# Patient Record
Sex: Male | Born: 1965 | Race: White | Hispanic: No | Marital: Single | Smoking: Former smoker
Health system: Southern US, Community
[De-identification: ages and names within clinical notes are randomized; demographics above are authoritative.]

## PROBLEM LIST (undated history)

## (undated) VITALS — BP 135/86 | HR 91 | Temp 99.0°F | Resp 18 | Ht 70.0 in | Wt 235.0 lb

## (undated) DIAGNOSIS — I10 Essential (primary) hypertension: Secondary | ICD-10-CM

## (undated) DIAGNOSIS — T7840XA Allergy, unspecified, initial encounter: Secondary | ICD-10-CM

## (undated) DIAGNOSIS — E785 Hyperlipidemia, unspecified: Secondary | ICD-10-CM

## (undated) DIAGNOSIS — E669 Obesity, unspecified: Secondary | ICD-10-CM

## (undated) DIAGNOSIS — F319 Bipolar disorder, unspecified: Secondary | ICD-10-CM

## (undated) DIAGNOSIS — F259 Schizoaffective disorder, unspecified: Secondary | ICD-10-CM

## (undated) DIAGNOSIS — E119 Type 2 diabetes mellitus without complications: Secondary | ICD-10-CM

## (undated) DIAGNOSIS — F22 Delusional disorders: Secondary | ICD-10-CM

## (undated) DIAGNOSIS — F250 Schizoaffective disorder, bipolar type: Secondary | ICD-10-CM

## (undated) HISTORY — DX: Allergy, unspecified, initial encounter: T78.40XA

## (undated) HISTORY — PX: APPENDECTOMY: SHX54

## (undated) HISTORY — PX: TONSILLECTOMY: SUR1361

---

## 1999-03-06 ENCOUNTER — Inpatient Hospital Stay (HOSPITAL_COMMUNITY): Admission: AD | Admit: 1999-03-06 | Discharge: 1999-03-17 | Payer: Self-pay | Admitting: Psychiatry

## 2007-08-15 ENCOUNTER — Inpatient Hospital Stay (HOSPITAL_COMMUNITY): Admission: EM | Admit: 2007-08-15 | Discharge: 2007-08-29 | Payer: Self-pay | Admitting: Psychiatry

## 2007-08-19 ENCOUNTER — Ambulatory Visit: Payer: Self-pay | Admitting: Psychiatry

## 2007-08-22 ENCOUNTER — Ambulatory Visit: Payer: Self-pay | Admitting: Psychiatry

## 2007-08-23 ENCOUNTER — Other Ambulatory Visit (HOSPITAL_COMMUNITY): Payer: Self-pay | Admitting: Psychiatry

## 2008-03-22 ENCOUNTER — Ambulatory Visit: Payer: Self-pay | Admitting: Psychiatry

## 2008-03-22 ENCOUNTER — Inpatient Hospital Stay (HOSPITAL_COMMUNITY): Admission: RE | Admit: 2008-03-22 | Discharge: 2008-03-26 | Payer: Self-pay | Admitting: Psychiatry

## 2008-08-02 ENCOUNTER — Ambulatory Visit: Payer: Self-pay | Admitting: Cardiovascular Disease

## 2010-12-12 NOTE — Assessment & Plan Note (Signed)
[facility] OFFICE NOTE   Greg, [name]                     MRN:          [id number]  DATE:08/02/2008                            DOB:          11-17-1965    REFERRING PHYSICIAN:  Gelene Mink MD   REASON FOR CONSULTATION:  Tachycardia, abnormal echocardiogram findings.   HISTORY OF PRESENT ILLNESS:  Greg [name] is a pleasant 45 year old  Caucasian male with a past medical history significant for diabetes  mellitus, cigarette use, smokeless tobacco use, hypertension, bipolar  disorder, and schizoaffective disorder, who is referred today for  further evaluation of resting tachycardia and an abnormal  echocardiogram.  The patient tells me that he has been in his normal  state of good health and denies any episodes of dyspnea, dyspnea on  exertion, fatigue, weakness, palpitations, diaphoresis, nausea,  vomiting, dizziness, near syncope, syncope, orthopnea, PND, or lower  extremity edema.  He does describe 2 episodes over the last several  years of having mild to substernal chest pain while lying in bed at  night.  He tells me the pain only lasts for a few seconds and resolves  when he rolled over on his other side.  This chest pain was not  associated with any dyspnea, palpitations, or diaphoresis.  He is  relatively inactive and lives in a group home secondary to his mental  disorder.  He was seen in the office of Dr. Timoteo [name] 1 month ago for  elevated blood pressure.  He was noted at that time to have a heart rate  in the 70s.  He was referred for an echocardiogram as well as an EKG.  The EKG showed sinus rhythm with a ventricular rate of 94 beats per  minute.  There were nonspecific T-wave abnormalities noted.  This was  also a borderline low-voltage EKG.  His echocardiogram was performed on  July 12, 2008, and was read by Greg [name] in [place], [place].  This showed normal left ventricular  size and function.  There  was mild left ventricular hypertrophy with mild diastolic dysfunction.  There were no significant valvular abnormalities noted.   PAST MEDICAL HISTORY:  1. Bipolar disorder.  2. Schizoaffective disorder.  3. Hypertension.  4. Tobacco abuse - the patient uses cigarettes and smokeless tobacco.      He smokes one half to one pack of cigarettes per day and per the      patient keeps smokeless tobacco in his mouth all day long.  5. Borderline diabetes mellitus.   PAST SURGICAL HISTORY:  Tonsillectomy.   ALLERGIES:  HALDOL, which made the patient short of breath.   CURRENT MEDICATIONS:  1. Klonopin 1 mg twice daily.  2. Risperdal 2 mg in the morning, 4 mg at night.  3. Depakene 500 mg in the morning, 1500 mg at night.  4. Protonix 40 mg once daily.  5. Benztropine 1 mg twice daily.  6. Benzonatate 200 mg 3 times daily.  7. Cephalexin 500 mg twice daily.  8. Metoprolol tartrate 50 mg in the morning.   SOCIAL HISTORY:  The  patient uses cigarettes and smokeless tobacco as  described above.  He denies use of alcohol or illicit drugs.  He is  single and is disabled secondary to his mental illness.  He lives in a  group home.   FAMILY HISTORY:  There is no family history of premature coronary artery  disease.  There is no family history of sudden cardiac death.  Both the  patient's parents are alive and healthy.  His father does have  hypertension.  He has no siblings.   REVIEW OF SYSTEMS:  As stated in history of present illness is otherwise  negative.   PHYSICAL EXAMINATION:  VITAL SIGNS:  Blood pressure 122/94, pulse 101  and regular, respirations 12 and unlabored.  GENERAL:  He is a pleasant, young Caucasian male in no acute distress.  He is alert and oriented x3.  SKIN:  Warm and dry.  PSYCHIATRIC:  Mood and affect are appropriate.  NEUROLOGIC:  No focal neurological deficits.  MUSCULOSKELETAL:  Muscle strength and tone is normal.  HEENT:  Normal.   NECK:  No JVD.  No carotid bruits.  No thyromegaly.  No lymphadenopathy.  LUNGS:  Clear to auscultation bilaterally without wheezes, rhonchi, or  crackles noted.  CARDIOVASCULAR:  Tachycardic, regular rhythm.  No murmurs, gallops, or  rubs noted.  ABDOMEN:  Soft, nontender, obese.  Bowel sounds are present.  EXTREMITIES:  No evidence of edema.  Pulses are 2+ in all extremities.   DIAGNOSTIC STUDIES.:  1. A 12-lead EKG obtained in an outside office on July 12, 2008,      shows sinus rhythm with a ventricular rate of 94 beats per minute.      This is a relatively low-amplitude EKG with nonspecific T-wave      abnormalities noted.  2. Transthoracic echocardiogram performed on July 12, 2008,      interpreted by Greg [name] showed normal left ventricular size and      function with ejection fraction estimated at 54%.  There is mild      left ventricular hypertrophy and mild diastolic dysfunction.  There      are no significant valvular abnormalities noted.  3. Carotid Doppler studies performed on July 12, 2008, at [facility] shows no hemodynamically significant stenosis in      the internal carotid arteries.  There is bilateral vertebral artery      patency with antegrade flow.  4. Recent laboratory values from June 30, 2008, show a hemoglobin      of 15.0, white blood cell count 8.9, potassium 4.7, TSH 2.572, T4      7.8.   ASSESSMENT AND PLAN:  This is a pleasant 45 year old Caucasian male with  history of borderline diabetes mellitus, hypertension, tobacco abuse,  bipolar disorder, and schizoaffective disorder, who presents for further  evaluation of resting sinus tachycardia.  The patient describes no  symptoms that are suggestive of arrhythmias.  His sinus tachycardia is  most likely a physiological response to some other stressor.  He has had  recent normal thyroid studies and is not anemic.  I feel that the  patient's use of smokeless tobacco for 16  hours per day with combined  cigarette use could explain some of his tachycardia.  The patient also  tells me that he has been under a great deal of stress in the last few  weeks.  He is very anxious and confirms his anxiety to me today.  I do  not think that any further cardiovascular workup is indicated at the  current time.  The patient has no symptoms that are suggestive of  myocardial ischemia or angina.  I have encouraged him to stop using  smokeless tobacco and to stop using cigarettes.  I have also encouraged  him to avoid any caffeinated beverages.  On another note, the patient  has no signs of ongoing infection or hypovolemia to explain his  tachycardia.  His echocardiogram showed no evidence of a pericardial  effusion.  I do not think that he needs to be followed in our office.  I  would recommend that followup echocardiogram be performed in 1-2 years  to reassess his level of left ventricular hypertrophy and diastolic  dysfunction.  I have had a discussion with the patient and his mother  about abnormal relaxation of the myocardium, which is described as  diastolic dysfunction.  Adequate blood pressure control will be the most  important way to prevent worsening of his left ventricular hypertrophy  and diastolic dysfunction.  He is encouraged to follow with Dr. Timoteo [name]  for his primary care needs.     Verne Carrow, MD  Electronically Signed    CM/MedQ  DD: 08/02/2008  DT: 08/03/2008  Job #: [id number]   cc:   Dr. Gelene [name]
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
## 2010-12-12 NOTE — Discharge Summary (Signed)
NAME:  Greg [name], Greg [name] NO.:  [id number]   MEDICAL RECORD NO.:  [id number]          PATIENT TYPE:  IPS   LOCATION:  0304                          FACILITY:  [facility]   PHYSICIAN:  Jasmine Pang, M.D. DATE OF BIRTH:  [birth year]   DATE OF ADMISSION:  08/15/2007  DATE OF DISCHARGE:  08/19/2007                               DISCHARGE SUMMARY   IDENTIFICATION:  This is a 45 year old divorced white male who was  admitted on a voluntary basis on August 15, 2007.   HISTORY OF PRESENT ILLNESS:  The patient presented to the [facility].  He was reporting increasing depression with suicidal ideation.  He stated that he felt like someone was out to kill him.  His mother  stated that he is no longer responding to his medications.  The patient  was tearful.  He says that nothing specific has happened to trigger this  episode.  He was felt to be having delusions.  He says that the back  screen door had been cut, so someone can come in and kill him.  He has  had no hospitalizations since the early 1990s, when he was found to be  bipolar.  He has already stated his last inpatient stay was in the early  1990s at [facility].  He was diagnosed with bipolar disorder.  He  has been followed as an outpatient at [facility].  He is currently under the care of Dr. Rollen Sox.   His grandmother had schizophrenia.  No other reported family history.   He has about two beers every 6 months.  He denies drugs.  He dips  tobacco at times.   He is known to have hypertension and obesity.   He is currently on Risperdal solution 4 mg at night, Depakene 500 mg in  the morning and 1500 mg at night.  He is also prescribed Klonopin 1 mg  nightly and Pepcid.   He is allergic to HALDOL DECANOATE, which produced anaphylaxis.   PHYSICAL FINDINGS:  The patient was medically cleared in the emergency  department at [facility].  His UDS was positive for  benzodiazepines, but he was prescribed Klonopin.  His urinalysis was  unremarkable.  His labs revealed that his random glucose was elevated at  261.  He is not known to be diabetic.  An a.m. Depakote level was 88.9  (50-70).   HOSPITAL COURSE:  Upon admission, the patient was placed on Depakene  liquid 500 mg in the morning and 1500 mg at nightly, Risperdal 1 mg p.o.  nightly, and Klonopin 1 mg p.o. q.a.m. and nightly.  On August 18, 2007, Risperdal liquid was increased to 4 mg p.o. nightly.  During  hospitalization, his TSH was done, which was 3.516.  Hemoglobin A1c was  slightly high at 8.6 (4.6-6.1).  In individual sessions with me, the  patient was friendly and cooperative.  He stated he was admitted due to  his paranoia.  He did think someone was trying to kill him.  He told his  parents about his fears and they  took him to [facility] who sent  him here.  He signed in on a voluntary basis.  His sleep was improving.  Appetite was good.  As hospitalization progressed, mood became less  depressed and less anxious.  He began to realize that there was no one  trying to hurt him and felt his paranoia had resolved.  On August 19, 2007, mental status had improved from remission status.  Mood was less  depressed and less anxious.  Affect consistent with mood.  There was no  suicidal or homicidal ideation.  No thoughts of self-injurious behavior.  No auditory or visual hallucinations.  No more paranoia or delusional  ideation.  Thoughts were logical and goal-directed, thought content.  No  predominant theme.  His mother was contacted and felt comfortable with  him returning home.  She did state she would like a referral for case  management to help with possible placement in assisted living.  She was  concerned because she and his father are aging and she wanted to have  something else in place to help care for him since they were his primary  caretakers.  This was arranged by our  case manager at the hospital.  Again, sleep was good and appetite was good.  It was felt the patient  was safe to be discharged today.   DISCHARGE DIAGNOSES:  Axis I:  Psychotic disorder, not otherwise  specified (rule out schizoaffective).  Axis II: None.  Axis III:  Hypertension and obesity.  Rule out new onset diabetes.  Axis IV:  Severe (occupational, housing, or relationship issues.  Burden  of psychiatric illness and burden of medical problems).  Axis V:  Global assessment of functioning on discharge was 48.  Global  assessment of functioning upon admission was 30.  Global assessment of  functioning highest past year was 55-60.   DISCHARGE PLANS:  There were no specific activity level or dietary  restrictions other than that recommended by his primary care physician  due to his possible diabetes.  The patient will return to the [facility] for followup treatment with Dr. Allyne Gee.   DISCHARGE MEDICATIONS:  1. Risperdal liquid 4 mg at bedtime.  2. Clonazepam 1 mg in the a.m. and at bedtime.  3. Depakene 500 mg in the a.m. and 1500 mg at bedtime.      Jasmine Pang, M.D.  Electronically Signed     BHS/MEDQ  D:  08/19/2007  T:  08/19/2007  Job:  [id number]
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
## 2010-12-15 NOTE — Discharge Summary (Signed)
NAME:  Greg [name], Greg [name] NO.:  [id number]   MEDICAL RECORD NO.:  [id number]          PATIENT TYPE:  IPS   LOCATION:  0406                          FACILITY:  [facility]   PHYSICIAN:  Anselm Jungling, MD  DATE OF BIRTH:  1966-04-30   DATE OF ADMISSION:  03/22/2008  DATE OF DISCHARGE:  03/26/2008                               DISCHARGE SUMMARY   IDENTIFYING DATA/REASON FOR ADMISSION:  The patient is a 45 year old  single white male, a Administrator, arts client, who has been  receiving psychiatric services from [facility] as  well.  He comes to [place] with a history of schizophrenia.  He had been  living with his parents.  Apparently, at home he became agitated, and  blew up with his parents over a disagreement regarding the issue of  going to a group home to live.  He was unable to contract for safety and  had been threatening his parents.  Please refer to the admission note  for further details pertaining to the symptoms, circumstances and  history that led to his hospitalization.  He was given an initial Axis I  diagnosis of history of psychosis NOS, and adjustment disorder, NOS.   MEDICAL AND LABORATORY:  The patient was in good health without any  active or chronic medical problems aside from obesity.  He was medically  and physically assessed by the psychiatric nurse practitioner.  There  were no significant medical issues.   HOSPITAL COURSE:  The patient was admitted to the adult inpatient  psychiatric service.  He presented as an obese male who was alert, fully  oriented, pleasant, calm and rational.  There were no overt signs or  symptoms of psychosis or thought disorder.  He was non-irritable, and  readily acknowledged that he had gotten too angry at home.  He said he  was no longer angry at his family.  He was able to discuss the issue of  going to group home in realistic terms, and with acceptance.  He  demonstrated moderate Parkinsonian  tremor, undoubtedly a side effect to  his medication regimen.   He was continued on his regimen of Risperdal, Klonopin, Depakote, and  Cogentin was increased to 1 mg t.i.d..  Trazodone was utilized for  sleep.   He was involved in the therapeutic milieu and was a pleasant and  cooperative individual throughout.  There were no behavioral problems  and no signs or symptoms of psychosis during his stay.  He was able to  work with case management, and with his family towards a plan that  involved going to an appropriate group home.  He appeared appropriate  for discharge on the 5th hospital day.  He agreed to the following  aftercare plan.   AFTERCARE:  The patient was to follow up with Dr. Lang Snow at the [facility] with an appointment on April 01, 2008 for medication  management.   DISCHARGE MEDICATION:  1. Risperdal 2 mg q.a.m. and 4 mg nightly.  2. Klonopin 1 mg b.i.d.  3. Depakote 500 mg q.a.m. and 1500 mg nightly.  4.  Cogentin 1 mg b.i.d.  5. Trazodone 50 mg nightly.   It should be noted that at the time of discharge, the patient's  Parkinsonian tremor was significantly less, but still present.  It was  anticipated that his outpatient providers would address this further  through medication management.      Anselm Jungling, MD  Electronically Signed     SPB/MEDQ  D:  03/29/2008  T:  03/29/2008  Job:  [id number]

## 2010-12-15 NOTE — Discharge Summary (Signed)
NAME:  FREEDOM, PEDDY NO.:  [id number]   MEDICAL RECORD NO.:  [id number]          PATIENT TYPE:  IPS   LOCATION:  0406                          FACILITY:  [facility]   PHYSICIAN:  Anselm Jungling, MD  DATE OF BIRTH:  [birth year]   DATE OF ADMISSION:  08/22/2007  DATE OF DISCHARGE:  08/29/2007                               DISCHARGE SUMMARY   IDENTIFYING DATA AND REASON FOR ADMISSION:  This was an inpatient  psychiatric admission for Cyruss, a 45 year old single male who was  admitted due to increase in psychotic symptoms, including paranoid  delusions.  Please refer to the admission note for further details  pertaining to the symptoms, circumstances and history that led to his  hospitalization.  He had been admitted under similar circumstances,  prior.  He was given initial Axis I diagnosis of psychosis NOS, rule out  schizoaffective disorder NOS.   MEDICAL AND LABORATORY:  The patient was medically and physically  assessed by the psychiatric nurse practitioner.  He was continued on his  usual clonidine 0.2 mg TTS patch every 7 days.  The patient was noted to  have prominent snoring in addition to obesity, and at the time of  discharge it was recommended that he explore sleep evaluation to rule  out sleep apnea.  There were no acute medical issues.   HOSPITAL COURSE:  The patient was admitted to the adult inpatient  service.  He presented as a obese, disheveled male who was cooperative,  but appeared to have significant thought disorder, and who discussed  strange things happening prior to admission.   He was treated with a psychotropic regimen that included Risperdal,  Depakote, and Klonopin.  He stabilizes nicely over several days.  He was  initially seen by Dr. Milford Cage.  The undersigned assumed his care  on the fourth hospital day.  On that date, I met with the patient  individually, reviewed the chart, and discussed his progress with the  nurse  practitioner and case manager.  At that time, he continued  disheveled, but was pleasant, calm, and denied any specific complaints.  He appeared to be making reasonable progress on his medication regimen.   The patient had a family meeting with his mother, and the patient's  family worked with the case manager towards finding a new assisted-  living facility.   The patient was stable enough for discharge on the eighth hospital day.  A new assisted-living facility had been found for him.  He was to follow-  up at [facility] with an appointment on September 01, 2007.   DISCHARGE MEDICATIONS:  Risperdal 4 mg q.h.s., Depakene 500 mg q.a.m.  and 1500 mg q.p.m., Klonopin 1 mg b.i.d. and clonidine 0.2 mg TTS patch  every 7 days, next due August 30, 2007.  The patient was instructed to  explore sleep apnea evaluation through his primary care physician.   DISCHARGE DIAGNOSES:  AXIS I: Bipolar disorder NOS  AXIS II: Deferred.  AXIS III: Rule out sleep apnea.  AXIS IV: Stressors severe.  AXIS V: GAF on discharge 60  Anselm Jungling, MD  Electronically Signed     SPB/MEDQ  D:  08/30/2007  T:  08/30/2007  Job:  [id number]

## 2011-04-20 LAB — URINALYSIS, ROUTINE W REFLEX MICROSCOPIC
Glucose, UA: NEGATIVE
Hgb urine dipstick: NEGATIVE
Nitrite: NEGATIVE
Protein, ur: NEGATIVE
Specific Gravity, Urine: 1.029
Urobilinogen, UA: 1
pH: 5.5

## 2011-04-20 LAB — TSH: TSH: 3.516

## 2011-04-20 LAB — RAPID URINE DRUG SCREEN, HOSP PERFORMED
Amphetamines: NOT DETECTED
Benzodiazepines: POSITIVE — AB
Cocaine: NOT DETECTED
Opiates: NOT DETECTED
Tetrahydrocannabinol: NOT DETECTED

## 2011-04-20 LAB — COMPREHENSIVE METABOLIC PANEL
ALT: 16
AST: 17
Albumin: 3.5
Alkaline Phosphatase: 58
BUN: 6
CO2: 24
Calcium: 8.8
Chloride: 97
Creatinine, Ser: 1.11
GFR calc non Af Amer: >60
Glucose, Bld: 261 — ABNORMAL HIGH
Potassium: 3.9
Sodium: 132 — ABNORMAL LOW
Total Bilirubin: 0.9
Total Protein: 7.1

## 2011-04-20 LAB — VALPROIC ACID LEVEL
Valproic Acid Lvl: 106.8 — ABNORMAL HIGH
Valproic Acid Lvl: 71.2
Valproic Acid Lvl: 88.9

## 2011-04-20 LAB — DIFFERENTIAL
Basophils Absolute: 0.1
Basophils Relative: 1
Eosinophils Absolute: 0.1
Eosinophils Relative: 1
Lymphocytes Relative: 33
Lymphs Abs: 2.9
Monocytes Absolute: 0.6
Monocytes Relative: 7
Neutro Abs: 5.2
Neutrophils Relative %: 58

## 2011-04-20 LAB — CBC
HCT: 43.5
Hemoglobin: 15.1
MCHC: 34.6
MCV: 83.1
Platelets: 261
RBC: 5.24
RDW: 13.5
WBC: 8.9

## 2011-04-20 LAB — GLUCOSE, RANDOM: Glucose, Bld: 178 — ABNORMAL HIGH

## 2011-04-20 LAB — ETHANOL

## 2011-04-20 LAB — HEMOGLOBIN A1C
Hgb A1c MFr Bld: 8.6 — ABNORMAL HIGH
Mean Plasma Glucose: 229

## 2012-08-04 LAB — URINALYSIS, COMPLETE
Bilirubin,UR: NEGATIVE
Blood: NEGATIVE
Glucose,UR: NEGATIVE mg/dL (ref 0–75)
Hyaline Cast: 28
Leukocyte Esterase: NEGATIVE
Nitrite: NEGATIVE
Ph: 7 (ref 4.5–8.0)
Protein: 30
RBC,UR: <1 /HPF (ref 0–5)
Specific Gravity: 1.014 (ref 1.003–1.030)
Squamous Epithelial: <1
WBC UR: <1 /HPF (ref 0–5)

## 2012-08-04 LAB — CBC
HCT: 42 % (ref 40.0–52.0)
HGB: 14.2 g/dL (ref 13.0–18.0)
MCH: 28.5 pg (ref 26.0–34.0)
MCHC: 33.9 g/dL (ref 32.0–36.0)
MCV: 84 fL (ref 80–100)
Platelet: 220 10*3/uL (ref 150–440)
RBC: 4.99 10*6/uL (ref 4.40–5.90)
RDW: 13.7 % (ref 11.5–14.5)
WBC: 17.7 10*3/uL — ABNORMAL HIGH (ref 3.8–10.6)

## 2012-08-04 LAB — COMPREHENSIVE METABOLIC PANEL
Albumin: 3.3 g/dL — ABNORMAL LOW (ref 3.4–5.0)
Alkaline Phosphatase: 56 U/L (ref 50–136)
Anion Gap: 11 (ref 7–16)
BUN: 12 mg/dL (ref 7–18)
Bilirubin,Total: 0.7 mg/dL (ref 0.2–1.0)
Calcium, Total: 8.5 mg/dL (ref 8.5–10.1)
Chloride: 102 mmol/L (ref 98–107)
Co2: 24 mmol/L (ref 21–32)
Creatinine: 0.91 mg/dL (ref 0.60–1.30)
EGFR (African American): >60
EGFR (Non-African Amer.): >60
Glucose: 124 mg/dL — ABNORMAL HIGH (ref 65–99)
Osmolality: 275 (ref 275–301)
Potassium: 4.3 mmol/L (ref 3.5–5.1)
SGOT(AST): 13 U/L — ABNORMAL LOW (ref 15–37)
SGPT (ALT): 17 U/L (ref 12–78)
Sodium: 137 mmol/L (ref 136–145)
Total Protein: 7.2 g/dL (ref 6.4–8.2)

## 2012-08-04 LAB — LIPASE, BLOOD: Lipase: 83 U/L (ref 73–393)

## 2012-08-05 ENCOUNTER — Inpatient Hospital Stay: Payer: Self-pay | Admitting: Surgery

## 2012-08-05 LAB — BASIC METABOLIC PANEL
Anion Gap: 9 (ref 7–16)
BUN: 11 mg/dL (ref 7–18)
Calcium, Total: 7.7 mg/dL — ABNORMAL LOW (ref 8.5–10.1)
Chloride: 101 mmol/L (ref 98–107)
Co2: 27 mmol/L (ref 21–32)
Creatinine: 1.04 mg/dL (ref 0.60–1.30)
EGFR (African American): >60
EGFR (Non-African Amer.): >60
Glucose: 170 mg/dL — ABNORMAL HIGH (ref 65–99)
Osmolality: 277 (ref 275–301)
Potassium: 4.8 mmol/L (ref 3.5–5.1)
Sodium: 137 mmol/L (ref 136–145)

## 2012-08-05 LAB — CBC WITH DIFFERENTIAL/PLATELET
Basophil #: 0 10*3/uL (ref 0.0–0.1)
Basophil %: 0.1 %
Eosinophil #: 0 10*3/uL (ref 0.0–0.7)
Eosinophil %: 0 %
HCT: 40.7 % (ref 40.0–52.0)
HGB: 13.8 g/dL (ref 13.0–18.0)
Lymphocyte #: 0.9 10*3/uL — ABNORMAL LOW (ref 1.0–3.6)
Lymphocyte %: 5.5 %
MCH: 28.9 pg (ref 26.0–34.0)
MCHC: 33.9 g/dL (ref 32.0–36.0)
MCV: 85 fL (ref 80–100)
Monocyte #: 1.5 x10 3/mm — ABNORMAL HIGH (ref 0.2–1.0)
Monocyte %: 9.1 %
Neutrophil #: 13.8 10*3/uL — ABNORMAL HIGH (ref 1.4–6.5)
Neutrophil %: 85.3 %
Platelet: 202 10*3/uL (ref 150–440)
RBC: 4.77 10*6/uL (ref 4.40–5.90)
RDW: 13.4 % (ref 11.5–14.5)
WBC: 16.1 10*3/uL — ABNORMAL HIGH (ref 3.8–10.6)

## 2012-08-05 LAB — TSH: Thyroid Stimulating Horm: 1.36 u[IU]/mL

## 2012-08-05 LAB — VALPROIC ACID LEVEL: Valproic Acid: 27 ug/mL — ABNORMAL LOW

## 2012-08-06 LAB — URINALYSIS, COMPLETE
Bilirubin,UR: NEGATIVE
Blood: NEGATIVE
Glucose,UR: NEGATIVE mg/dL (ref 0–75)
Ketone: NEGATIVE
Leukocyte Esterase: NEGATIVE
Nitrite: NEGATIVE
Ph: 6 (ref 4.5–8.0)
Protein: NEGATIVE
RBC,UR: <1 /HPF (ref 0–5)
Specific Gravity: 1.013 (ref 1.003–1.030)
Squamous Epithelial: <1
WBC UR: 1 /HPF (ref 0–5)

## 2012-08-06 LAB — PATHOLOGY REPORT

## 2012-08-06 LAB — WBC: WBC: 10.9 10*3/uL — ABNORMAL HIGH (ref 3.8–10.6)

## 2012-08-07 LAB — CBC WITH DIFFERENTIAL/PLATELET
Basophil #: 0 10*3/uL (ref 0.0–0.1)
Basophil %: 0.5 %
Eosinophil #: 0.3 10*3/uL (ref 0.0–0.7)
Eosinophil %: 3 %
HCT: 32.4 % — ABNORMAL LOW (ref 40.0–52.0)
HGB: 10.6 g/dL — ABNORMAL LOW (ref 13.0–18.0)
Lymphocyte #: 2.6 10*3/uL (ref 1.0–3.6)
Lymphocyte %: 29.9 %
MCH: 27.8 pg (ref 26.0–34.0)
MCHC: 32.6 g/dL (ref 32.0–36.0)
MCV: 85 fL (ref 80–100)
Monocyte #: 0.8 x10 3/mm (ref 0.2–1.0)
Monocyte %: 8.6 %
Neutrophil #: 5.1 10*3/uL (ref 1.4–6.5)
Neutrophil %: 58 %
Platelet: 197 10*3/uL (ref 150–440)
RBC: 3.81 10*6/uL — ABNORMAL LOW (ref 4.40–5.90)
RDW: 13.3 % (ref 11.5–14.5)
WBC: 8.8 10*3/uL (ref 3.8–10.6)

## 2012-08-07 LAB — BASIC METABOLIC PANEL
Anion Gap: 7 (ref 7–16)
BUN: 9 mg/dL (ref 7–18)
Calcium, Total: 8.6 mg/dL (ref 8.5–10.1)
Chloride: 102 mmol/L (ref 98–107)
Co2: 29 mmol/L (ref 21–32)
Creatinine: 0.78 mg/dL (ref 0.60–1.30)
EGFR (African American): >60
EGFR (Non-African Amer.): >60
Glucose: 108 mg/dL — ABNORMAL HIGH (ref 65–99)
Osmolality: 275 (ref 275–301)
Potassium: 3.9 mmol/L (ref 3.5–5.1)
Sodium: 138 mmol/L (ref 136–145)

## 2013-10-15 LAB — COMPREHENSIVE METABOLIC PANEL
Albumin: 3.9 g/dL (ref 3.4–5.0)
Alkaline Phosphatase: 52 U/L
Anion Gap: 6 — ABNORMAL LOW (ref 7–16)
BUN: 8 mg/dL (ref 7–18)
Bilirubin,Total: 0.4 mg/dL (ref 0.2–1.0)
Calcium, Total: 8.8 mg/dL (ref 8.5–10.1)
Chloride: 100 mmol/L (ref 98–107)
Co2: 27 mmol/L (ref 21–32)
Creatinine: 0.91 mg/dL (ref 0.60–1.30)
EGFR (African American): >60
EGFR (Non-African Amer.): >60
Glucose: 205 mg/dL — ABNORMAL HIGH (ref 65–99)
Osmolality: 271 (ref 275–301)
Potassium: 3.6 mmol/L (ref 3.5–5.1)
SGOT(AST): 16 U/L (ref 15–37)
SGPT (ALT): 17 U/L (ref 12–78)
Sodium: 133 mmol/L — ABNORMAL LOW (ref 136–145)
Total Protein: 7.9 g/dL (ref 6.4–8.2)

## 2013-10-15 LAB — CBC
HCT: 42.2 % (ref 40.0–52.0)
HGB: 14.2 g/dL (ref 13.0–18.0)
MCH: 28.1 pg (ref 26.0–34.0)
MCHC: 33.6 g/dL (ref 32.0–36.0)
MCV: 84 fL (ref 80–100)
Platelet: 264 10*3/uL (ref 150–440)
RBC: 5.05 10*6/uL (ref 4.40–5.90)
RDW: 13.1 % (ref 11.5–14.5)
WBC: 8.9 10*3/uL (ref 3.8–10.6)

## 2013-10-15 LAB — ETHANOL
Ethanol %: <0.003 % (ref 0.000–0.080)
Ethanol: <3 mg/dL

## 2013-10-15 LAB — SALICYLATE LEVEL: Salicylates, Serum: <1.7 mg/dL

## 2013-10-15 LAB — ACETAMINOPHEN LEVEL: Acetaminophen: <2 ug/mL

## 2013-10-16 ENCOUNTER — Inpatient Hospital Stay: Payer: Self-pay | Admitting: Psychiatry

## 2013-10-16 LAB — DRUG SCREEN, URINE

## 2013-10-16 LAB — URINALYSIS, COMPLETE
Bacteria: NONE SEEN
Bilirubin,UR: NEGATIVE
Blood: NEGATIVE
Glucose,UR: 50 mg/dL (ref 0–75)
Leukocyte Esterase: NEGATIVE
Nitrite: NEGATIVE
Ph: 5 (ref 4.5–8.0)
Protein: NEGATIVE
RBC,UR: NONE SEEN /HPF (ref 0–5)
Specific Gravity: 1.015 (ref 1.003–1.030)
Squamous Epithelial: NONE SEEN
WBC UR: 1 /HPF (ref 0–5)

## 2013-10-19 LAB — LIPID PANEL
Cholesterol: 105 mg/dL (ref 0–200)
HDL Cholesterol: 43 mg/dL (ref 40–60)
Ldl Cholesterol, Calc: 45 mg/dL (ref 0–100)
Triglycerides: 85 mg/dL (ref 0–200)
VLDL Cholesterol, Calc: 17 mg/dL (ref 5–40)

## 2013-10-19 LAB — HEMOGLOBIN A1C: Hemoglobin A1C: 7.1 % — ABNORMAL HIGH (ref 4.2–6.3)

## 2013-10-19 LAB — VALPROIC ACID LEVEL: Valproic Acid: 84 ug/mL

## 2013-10-19 LAB — LITHIUM LEVEL: Lithium: 0.67 mmol/L

## 2013-10-19 LAB — AMMONIA: Ammonia, Plasma: 20 mcmol/L (ref 11–32)

## 2014-08-13 DIAGNOSIS — E559 Vitamin D deficiency, unspecified: Secondary | ICD-10-CM | POA: Diagnosis not present

## 2014-08-13 DIAGNOSIS — G894 Chronic pain syndrome: Secondary | ICD-10-CM | POA: Diagnosis not present

## 2014-08-13 DIAGNOSIS — I10 Essential (primary) hypertension: Secondary | ICD-10-CM | POA: Diagnosis not present

## 2014-08-13 DIAGNOSIS — F17210 Nicotine dependence, cigarettes, uncomplicated: Secondary | ICD-10-CM | POA: Diagnosis not present

## 2014-08-13 DIAGNOSIS — Z716 Tobacco abuse counseling: Secondary | ICD-10-CM | POA: Diagnosis not present

## 2014-08-13 DIAGNOSIS — F259 Schizoaffective disorder, unspecified: Secondary | ICD-10-CM | POA: Diagnosis not present

## 2014-08-13 DIAGNOSIS — Z79899 Other long term (current) drug therapy: Secondary | ICD-10-CM | POA: Diagnosis not present

## 2014-08-13 DIAGNOSIS — E780 Pure hypercholesterolemia: Secondary | ICD-10-CM | POA: Diagnosis not present

## 2014-08-13 DIAGNOSIS — E119 Type 2 diabetes mellitus without complications: Secondary | ICD-10-CM | POA: Diagnosis not present

## 2014-08-13 DIAGNOSIS — E669 Obesity, unspecified: Secondary | ICD-10-CM | POA: Diagnosis not present

## 2014-08-13 DIAGNOSIS — R739 Hyperglycemia, unspecified: Secondary | ICD-10-CM | POA: Diagnosis not present

## 2014-08-13 DIAGNOSIS — R5383 Other fatigue: Secondary | ICD-10-CM | POA: Diagnosis not present

## 2014-08-13 DIAGNOSIS — F3130 Bipolar disorder, current episode depressed, mild or moderate severity, unspecified: Secondary | ICD-10-CM | POA: Diagnosis not present

## 2014-09-17 DIAGNOSIS — E871 Hypo-osmolality and hyponatremia: Secondary | ICD-10-CM | POA: Diagnosis not present

## 2014-09-17 DIAGNOSIS — F209 Schizophrenia, unspecified: Secondary | ICD-10-CM | POA: Diagnosis not present

## 2014-09-18 DIAGNOSIS — F250 Schizoaffective disorder, bipolar type: Secondary | ICD-10-CM | POA: Diagnosis not present

## 2014-09-20 ENCOUNTER — Inpatient Hospital Stay: Payer: Self-pay | Admitting: Psychiatry

## 2014-09-20 DIAGNOSIS — E119 Type 2 diabetes mellitus without complications: Secondary | ICD-10-CM | POA: Diagnosis not present

## 2014-09-20 DIAGNOSIS — K219 Gastro-esophageal reflux disease without esophagitis: Secondary | ICD-10-CM | POA: Diagnosis not present

## 2014-09-20 DIAGNOSIS — Z9119 Patient's noncompliance with other medical treatment and regimen: Secondary | ICD-10-CM | POA: Diagnosis present

## 2014-09-20 DIAGNOSIS — G250 Essential tremor: Secondary | ICD-10-CM | POA: Diagnosis present

## 2014-09-20 DIAGNOSIS — E785 Hyperlipidemia, unspecified: Secondary | ICD-10-CM | POA: Diagnosis not present

## 2014-09-20 DIAGNOSIS — F250 Schizoaffective disorder, bipolar type: Secondary | ICD-10-CM | POA: Diagnosis not present

## 2014-09-20 DIAGNOSIS — Z818 Family history of other mental and behavioral disorders: Secondary | ICD-10-CM | POA: Diagnosis not present

## 2014-09-20 DIAGNOSIS — Z79899 Other long term (current) drug therapy: Secondary | ICD-10-CM | POA: Diagnosis not present

## 2014-09-20 DIAGNOSIS — F419 Anxiety disorder, unspecified: Secondary | ICD-10-CM | POA: Diagnosis present

## 2014-09-20 DIAGNOSIS — Z8659 Personal history of other mental and behavioral disorders: Secondary | ICD-10-CM | POA: Diagnosis not present

## 2014-09-20 DIAGNOSIS — I10 Essential (primary) hypertension: Secondary | ICD-10-CM | POA: Diagnosis present

## 2014-09-20 DIAGNOSIS — F22 Delusional disorders: Secondary | ICD-10-CM | POA: Diagnosis present

## 2014-09-20 DIAGNOSIS — G4700 Insomnia, unspecified: Secondary | ICD-10-CM | POA: Diagnosis present

## 2014-09-20 DIAGNOSIS — F312 Bipolar disorder, current episode manic severe with psychotic features: Secondary | ICD-10-CM | POA: Diagnosis not present

## 2014-10-21 DIAGNOSIS — F250 Schizoaffective disorder, bipolar type: Secondary | ICD-10-CM | POA: Diagnosis not present

## 2014-11-19 NOTE — Consult Note (Signed)
PATIENT NAME:  KEESHAWN, FAKHOURI MR#:  [id number] DATE OF BIRTH:  07-10-1966  DATE OF CONSULTATION:  08/05/2012  CONSULTING PHYSICIAN:  Enid Baas, MD ADMITTING PHYSICIAN: Marshia Ly, MD   REASON FOR CONSULTATION: Possible seizure episode.  PRIMARY CARE PHYSICIAN: Meindert A. Lacie Scotts, MD  BRIEF HISTORY: The patient is a 49 year old Caucasian male with past medical history significant for essential tremors and also schizoaffective disorder, presented to the ER secondary to severe abdominal pain. CT findings were consistent with acute appendicitis, so he was admitted to surgical service and had appendectomy done on 08/04/2012 night. Post surgery, according to RN, patient has been ambulatory, had clear speech, but early morning of 08/05/2012, when they went in to check on the patient, he was found with fluttering eyelids, his eyes were rolled to one side and his speech was extremely slurred and he was confused. No other body shaking was noticed at the time. At the time of my exam, the patient was alert and oriented, though he had intermittent confusion when he was answering something else to a different question. He denied any prior history of seizures in the past. He is taking valproic acid for his mood disorder, not on any other antiepileptic medications otherwise. He is still in pain from his surgery. He does not have any focal neurological deficits.   PAST MEDICAL HISTORY:  1. Essential tremors. 2. Schizoaffective disorder. 3. Borderline diabetes mellitus.   PAST SURGICAL HISTORY: Laparoscopic appendectomy done on 08/04/2012.   ALLERGIES TO MEDICATIONS: HALDOL.  HOME MEDICATIONS: He is on: 1. Cogentin.  2. Inderal.  3. Valproic acid. 4. Risperdal.  Unknown doses at this time.   SOCIAL HISTORY: He lives with his parents. Quit smoking more than 10 years ago. No alcohol or drug abuse.   FAMILY HISTORY: No significant medical problems in the family.  REVIEW OF  SYSTEMS: CONSTITUTIONAL: No fever, fatigue or weakness.  EYES: No blurred vision, double vision, glaucoma or cataracts.  ENT: No tinnitus, ear pain, epistaxis or discharge.  RESPIRATORY: No cough, wheeze, hemoptysis or COPD. CARDIOVASCULAR: No chest pain, orthopnea, edema, arrhythmia, palpitations or syncope.  GASTROINTESTINAL: Positive for abdominal pain. No nausea, vomiting or diarrhea. No melena or hematemesis.  GENITOURINARY: No dysuria, hematuria, renal calculus, frequency or incontinence.  ENDOCRINE: No polyuria, nocturia, thyroid problems, heat or cold intolerance.  HEMATOLOGY: No anemia, easy bruising or bleeding.  SKIN: No acne, rash or lesions.  MUSCULOSKELETAL: No neck, back, shoulder pain, arthritis or gout.  NEUROLOGICALLY: No numbness, weakness, CVA or TIA. Positive for seizure episode today.  PSYCHOLOGICAL: He has history of schizoaffective disorder, currently not anxious or depressed.   PHYSICAL EXAMINATION:  VITAL SIGNS: Temperature 98.5 degrees Fahrenheit, pulse 103, respirations 22, blood pressure 108/74, pulse ox 96% on 4 liters.  GENERAL: Well-built, well-nourished male sitting in bed, not in any acute distress.  HEENT: Normocephalic, atraumatic. Pupils equal, round, reacting to light. Anicteric sclerae. Extraocular movements intact. No nystagmus. Oropharynx clear without erythema, mass or exudates.  NECK: Supple. No thyromegaly, JVD or carotid bruits. No lymphadenopathy.  LUNGS: Moving air bilaterally. Decreased bibasilar breath sounds and mild rhonchi. No wheeze or crackles. No use of accessory muscles for breathing.  CARDIOVASCULAR: S1, S2 regular rate and rhythm. No murmurs, rubs or gallops.  ABDOMEN: Soft. Mild tenderness with voluntary  guarding. No rigidity. Dressing in place at his laparoscopic sites. Hypoactive bowel sounds.  EXTREMITIES: No pedal edema. No clubbing or cyanosis. 2+ dorsalis pedis pulses bilaterally. SKIN: No acne, rash or lesions.  LYMPHATICS:  No cervical lymphadenopathy.  NEUROLOGICALLY: No focal neurological deficits. Cranial nerves are intact. Strength is 5/5 in all 4 extremities and normal sensation.  PSYCHOLOGICALLY: The patient is alert and oriented x 3, however gets confused intermittently on my exam.  LABORATORY DATA: WBC 16.1, hemoglobin 13.3, hematocrit 40.7, platelet count 202. Sodium 137, potassium 4.8, chloride 101, bicarb 27, BUN 11, creatinine 1.04, glucose 170, calcium of 7.7. TSH is 1.36. Urine analysis negative for any infection. CT of the abdomen and pelvis showing hypoventilation of lung bases and acute appendicitis findings with gallstones seen on admission.   RECOMMENDATIONS: A 49 year old male with schizoaffective disorder and essential tremors who was admitted for acute appendicitis status post laparoscopic appendectomy was noticed to have a seizure, possible seizure this a.m. and confusion afterwards.   1. Seizure and postictal confusion. The slurred speech was probably part of his postictal state as his speech is more clear now. He does not have any focal neuro deficits. A CT of the head was ordered and it did not show any acute findings. Because of the recent surgery, probably MRI cannot be done, so EEG ordered. Will check valproic acid level. If it is low, will give him a dose of IV valproic acid. Not sure if stress from the surgery could have triggered his seizures. He does not have any prior history of seizures. Will do neuro checks, seizure precautions, Ativan p.r.n. and also a neurology consult. Hold off on starting any new antiepileptics at this time and take off all the medications that could decrease seizure threshold.  2. For his schizoaffective disorder, continue home medications.  3. Tremors. Continue Inderal.  4. Acute appendicitis status post appendectomy. Management per Surgery. Currently on clear liquid diet.  5. Leukocytosis probably from his appendicitis and ulcers. Stress reaction from his seizure.  Follow up in a.m.  6. Code status: Full code.  7. Acute respiratory failure. Dyspneic on exam because of poor ventilation, poor respiratory effort. He is strongly advised to sit straight up, increase ambulation and also use incentive spirometry. Wean oxygen as tolerated.   Time spent in consultation is 50 minutes.      ____________________________ Enid Baasadhika Jerrold Haskell, MD rk:es D: 08/05/2012 14:23:50 ET T: 08/05/2012 15:45:17 ET JOB#: [id number]  cc: Enid Baasadhika Cashel Bellina, MD, <Dictator> Enid BaasADHIKA Trinton Prewitt MD ELECTRONICALLY SIGNED 08/08/2012 14:14

## 2014-11-19 NOTE — Consult Note (Signed)
PATIENT NAME:  Greg [name], [name]:  [id number] DATE OF BIRTH:  [birth year]  DATE OF CONSULTATION:  08/05/2012  REQUESTING PHYSICIAN:    Enid Baasadhika Kalisetti, MD.  CONSULTING PHYSICIAN:  Hemang K. Sherryll BurgerShah, MD  PRIMARY CARE PHYSICIAN: Meindert A. Lacie ScottsNiemeyer, MD  HISTORY OF PRESENT ILLNESS: [name] is a 49 year old Caucasian gentleman with a history of schizoaffective disorder, was admitted on 08/04/2012 for acute onset of right lower quadrant abdominal pain and thought to have appendicitis, which was consistent with imaging as well and went for laparoscopic appendectomy.   On 08/05/2012 early in the morning, around 7 or 7:30 o'clock during nursing end of rounds, the patient was difficult to arouse.  He was flickering his eyes. The patient was also seen by an anesthesiologist at that time in the room.  They felt that he had a spell. After the spell the patient was quite confused, but later in the day the patient is much less confused. It has been called a seizure.   I had an extensive discussion with the mother who did not see this spell, but she mentioned he never had a history of seizures. The patient felt like he had bizarre spell like a "tantrum".  He was agitated, he did not hear other people.  He has amnesia of the spell.  The patient has a history of schizoaffective disorder and has been taking Risperdal, Cogentin, Inderal and Klonopin. He has tremors in his right hand, more than the left, likely from the medication side effect from neuroleptic and valproic acid.   PAST MEDICAL HISTORY:  1.  Schizoaffective disorder. 2.  Recent appendicitis.   PAST SURGICAL HISTORY: Laparoscopic appendectomy.   SOCIAL HISTORY: Significant that he does not smoke, does not drink alcohol. He lives with his mother.   FAMILY HISTORY: Positive for seizures in his sister, who died in late teenage years. Otherwise, no history of epilepsy in the family.   REVIEW OF SYSTEMS: Positive for right lower quadrant  abdominal pain and feeling goofy.  Otherwise, 10 system review of system was asked and was found to be negative.   PHYSICAL EXAMINATION:  VITAL SIGNS: Temperature 98.5, pulse 103, respiratory rate 22, blood pressure 108/74, pulse oximetry 96% on 4 liters of oxygen.  GENERAL: Centrally obese Caucasian gentleman lying in bed, not in acute distress. He has red marks on multiple parts of the body. He has a distended abdomen. He has laparoscopic instrument scar and three ports. ABDOMEN:  He has some abdominal pain that seems to be better.  LUNGS: Clear to auscultation.  HEART: S1, S2 heart sounds.  NECK: Carotid examination does not reveal any bruit.  NEUROLOGIC: He was alert, oriented to 08/05/2012. He was able to tell me his address. He followed two-step commands okay. He told me the current president and previous president Danae OrleansBush, previous [employer]rah[employer] Entertainmentpresident Clinton. He took a little bit longer time to respond to the complex questions.  Cranial Nerves:  Pupils are equal, round and reactive. Extraocular movements were intact. His face was symmetric. Tongue was midline. Facial sensations were intact. His hearing was intact. He does not have dysarthria.  Motor:  He has tremors in his right hand, which was mostly at the wrist, but also happened during active movement of his right hand.  He had no tremor in the left upper and bilateral lower extremity. His strength seemed to be symmetric. His sensations were intact. His reflexes were symmetric. I did not check his gait.   DIAGNOSTIC DATA: CT scan of the  head was unremarkable.   ASSESSMENT AND PLAN:  Spell this morning, which has been called seizures but I am not quite sure whether we can definitely classify the spell as a seizure for now. This could have a medication side effect versus sleep-state disorder, etc. There a few features that may suggest that this might have been a seizure, such as he had significant confusion early on, which has been progressively getting  better. As well, he does have positive family history of seizures in his sister. I will review his EEG. Mother will keep an eye open for similar spells in the future. I do not plan to start him on any other antiepileptic medication. He is already on very low dose of valproic acid for his psychiatric reasons that can be continued.  Ideally I would like to obtain MRI of the brain, which can be done if it is okay with surgeon that can be done as an outpatient, as well.   I talked to them about seizure precautions, such as no driving, no use of heavy machinery.  I talked to the mother about first-aid for seizure that she is aware of from her past experience.   I will see them on a less frequent basis. Feel free to contact me with any further questions. I will see them in follow-up in 2 to 3 weeks as an outpatient.     ____________________________ Hemang K. Sherryll Burger, MD hks:eg D: 08/05/2012 15:27:59 ET T: 08/05/2012 17:15:24 ET JOB#: [id number]  cc:  1.  Hemang K. Sherryll Burger, MD, <Dictator> 2.  Enid Baas, MD 3.  Meindert A. Lacie Scotts, MD  Durene Cal [facility] MD ELECTRONICALLY SIGNED 08/08/2012 7:06

## 2014-11-19 NOTE — Consult Note (Signed)
Brief Consult Note: Diagnosis: Confusion, possible seizure, post appendectomy, schizoaffective disorder.   Patient was seen by consultant.   Consult note dictated.   Orders entered.   Comments: 49y/o M with schizoaffective disorder, essential tremors admitted for acute appendicitis and had laproscopic appendectomy last night, RN noticed a possible seizure this am and confusion afterwards  * Seizure and post ictal confusion- no focal neuro deficits on exam 'check valproic acid level, CT head and EEG no prior h/o seizures, probably stress from surgery could have triggered it neuro consult, neuro checks, seizure precautions, ativan prn hold off any meds which could decrease seizure threshold hold off on antiepileptics for now  * Schizoaffective disorder- cont home meds  * Tremors- Inderal  * Acute appendicitis- post appendectomy mgmt per surgery, on clear liquid diet now.  Electronic Signatures: KalisettiEnid Baas, Brighten Buzzelli (MD)  (Signed 07-Jan-14 09:54)  Authored: Brief Consult Note   Last Updated: 07-Jan-14 09:54 by Enid BaasKalisetti, Robi Dewolfe (MD)

## 2014-11-19 NOTE — H&P (Signed)
PATIENT NAME:  Greg [name], Greg [name] MR#:  [id number] DATE OF BIRTH:  1966/03/25  DATE OF ADMISSION:  08/04/2012  PRIMARY CARE PHYSICIAN: Evelene CroonMeindert Niemeyer, MD   ADMITTING PHYSICIAN: Quentin Orealph L. Ely III, MD   CHIEF COMPLAINT: Abdominal pain.   BRIEF HISTORY: The patient is a 49 year old gentleman seen in the Emergency Room with a 24-hour history of abdominal pain. He was in his usual state of good health until yesterday when he developed marked periumbilical right lower quadrant abdominal pain. He has had several previous episodes, all of which have resolved spontaneously. This episode was the first which did not improve. He had some mild nausea, did not vomit, noted no fever, chills, diarrhea, or constipation. He last ate a meal yesterday, had some V8 Juice about 6:00 this morning. The pain did not improve, so he presented to the Emergency Room for further evaluation. White blood cell count in the Emergency Room was 17,000. CT scan was performed which demonstrated a thickened appendix with an appendicolith, periappendiceal changes consistent with acute appendicitis. The surgical service was consulted.   He denies any previous GI problems. He does not have a history of hepatitis, yellow jaundice, pancreatitis, peptic ulcer disease, gallbladder disease or diverticulitis. He does have history of hypertension, schizoaffective disorder. No cardiac disease or diabetes.   CURRENT MEDICATIONS: Cogentin, Inderal, valproic acid and Risperdal, but he did not know the doses at this time.   ALLERGIES: HE IS ALLERGIC TO HALDOL, WHICH ALLEGEDLY CAUSES ANAPHYLAXIS.   SOCIAL HISTORY: He is a reformed cigarette smoker, currently not smoking. He [place] by [place] psychiatrist in [place].   PHYSICAL EXAMINATION: GENERAL: He is an alert, pleasant, comfortable gentleman in no significant distress.  VITAL SIGNS: His temperature is 99.5, his heart rate is 90 and regular, and pain level is 10, blood pressure is 95/59.   HEENT: No scleral icterus. No facial deformities. No pupillary abnormalities.  NECK: Supple without adenopathy, point tenderness and a nonpalpable thyroid. He does have a midline trachea.  CHEST: Clear with no adventitious sounds.  CARDIAC: No murmurs or gallops to my ear. He seems to be in normal sinus rhythm.  ABDOMEN: His abdomen is soft with some mild generalized tenderness but point tenderness in the right lower quadrant, rebound and guarding. He has hypoactive but present bowel sounds.  EXTREMITIES: Lower extremity exam reveals no deformities. Full range of motion and normal distal pulses.  PSYCHIATRIC: Normal orientation and affect to the current examination.   ASSESSMENT AND PLAN: I have independently reviewed his CT scan. He does appear to have acute appendicitis both clinically and by imaging. We would recommend surgical intervention. This plan has been discussed with the patient, and he is in agreement.  ____________________________ Carmie Endalph L. Ely III, MD rle:cb D: 08/04/2012 11:45:17 ET T: 08/04/2012 11:56:31 ET JOB#: [id number]  cc: Quentin Orealph L. Ely III, MD, <Dictator> Meindert A. Lacie ScottsNiemeyer, MD Quentin OreALPH L ELY MD ELECTRONICALLY SIGNED 08/07/2012 18:50

## 2014-11-19 NOTE — Op Note (Signed)
PATIENT NAME:  [name], Greg F MR#:  [id number] DATE OF BIRTH:  01-30-1966  DATE OF PROCEDURE:  08/04/2012  PREOPERATIVE DIAGNOSIS: Acute appendicitis.   POSTOPERATIVE DIAGNOSIS: Acute appendicitis, gangrenous.   SURGEON: Claude MangesWilliam F Trentin Knappenberger, MD.   OPERATION PERFORMED: Laparoscopic cholecystectomy.    ANESTHESIA: General.   PROCEDURE IN DETAIL: The patient was placed supine on the operating room table and prepped and draped in the usual sterile fashion.  A Hasson cannula was introduced amidst horizontal mattress sutures of 0 Vicryl that were placed in the midline linea alba just above the umbilicus. A 15 mmHg CO2 pneumoperitoneum was created and two additional 5 mm trocars were placed. The appendix was noted to be gangrenous and there was some exudate in the right lower quadrant, and there was about 5 to 10 mL of pus in the pelvis. This was all suctioned clear and the appendix was dissected out from a fairly chronic pocket, which was retrocecal, and the mesoappendix was taken down with the Harmonic scalpel and 2 or 3 hemoclips on the appendicular artery. The appendectomy was performed with the echelon stapling device, which was an echelon 60 with a blue load. This was done in order to take a little bit of the cecum as the appendix was gangrenous all the way to the very base. The appendix was placed in an EndoCatch bag and extracted from the abdomen via the epigastric port, and the right lower quadrant was irrigated with copious amounts of warm normal saline as was the pelvis, and this was all suctioned clear again. Hemostasis was excellent. The tip of the omentum was dragged over to the area of the appendectomy, and the peritoneum was desufflated and decannulated and the linea alba was closed with a running 0 PDS suture and the previously placed Vicryls were tied one to another. All three skin sites were closed with subcuticular 5-0 Monocryl and suture strips. The patient tolerated the procedure well.  There were no complications.    ____________________________ Claude MangesWilliam F. Keturah Yerby, MD wfm:th D: 08/04/2012 22:00:37 ET T: 08/05/2012 11:56:20 ET JOB#: [id number]  cc: Claude MangesWilliam F. Season Astacio, MD, <Dictator> Claude MangesWILLIAM F Shelie Lansing MD ELECTRONICALLY SIGNED 08/05/2012 20:17

## 2014-11-19 NOTE — Discharge Summary (Signed)
PATIENT NAME:  Greg [name], Greg [name] MR#:  [id number] DATE OF BIRTH:  1966/02/16  DATE OF ADMISSION:  08/05/2012 DATE OF DISCHARGE:  08/07/2012  PRINCIPLE DIAGNOSIS: Acute gangrenous appendicitis.   OTHER DIAGNOSES:  Schizoaffective disorder borderline diabetes mellitus, essential tremors, possible postoperative seizure with postictal confusion.   PRINCIPLE PROCEDURE PERFORMED DURING THIS ADMISSION: Laparoscopic appendectomy.   HOSPITAL COURSE: The patient was admitted to the hospital and underwent the above-mentioned procedure for the above-mentioned diagnosis. He had some confusion that was thought possibly secondary to a seizure due to lowering of seizure threshold from medications. Ultimately, this improved and he remained afebrile on IV antibiotics and he was discharged home. He was asked to make an appointment to see me in 1 to 2 days and to call the office in the interim for any problems.     ____________________________ Claude MangesWilliam [name]. Ozella Comins, MD wfm:cs D: 08/07/2012 19:32:00 ET T: 08/07/2012 21:43:55 ET JOB#: [id number]  cc: Claude MangesWilliam [name]. Dannetta Lekas, MD, <Dictator> Meindert A. Lacie ScottsNiemeyer, MD Claude MangesWILLIAM [name] Keilana Morlock MD ELECTRONICALLY SIGNED 08/13/2012 8:52

## 2014-11-20 NOTE — Consult Note (Signed)
Brief Consult Note: Diagnosis: Schizoaffective disorder bipolar type.   Patient was seen by consultant.   Recommend further assessment or treatment.   Orders entered.   Comments: Will admit to psychiatry.  Electronic Signatures: Kristine LineaPucilowska, Archit Leger (MD)  (Signed 20-Mar-15 14:38)  Authored: Brief Consult Note   Last Updated: 20-Mar-15 14:38 by Kristine LineaPucilowska, Yoni Lobos (MD)
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
## 2014-11-21 DIAGNOSIS — Z9119 Patient's noncompliance with other medical treatment and regimen: Secondary | ICD-10-CM | POA: Diagnosis not present

## 2014-11-21 DIAGNOSIS — F329 Major depressive disorder, single episode, unspecified: Secondary | ICD-10-CM | POA: Diagnosis not present

## 2014-11-21 DIAGNOSIS — E785 Hyperlipidemia, unspecified: Secondary | ICD-10-CM | POA: Diagnosis not present

## 2014-11-21 DIAGNOSIS — R258 Other abnormal involuntary movements: Secondary | ICD-10-CM | POA: Diagnosis not present

## 2014-11-21 DIAGNOSIS — F312 Bipolar disorder, current episode manic severe with psychotic features: Secondary | ICD-10-CM | POA: Diagnosis not present

## 2014-11-21 DIAGNOSIS — R197 Diarrhea, unspecified: Secondary | ICD-10-CM | POA: Diagnosis not present

## 2014-11-21 DIAGNOSIS — I10 Essential (primary) hypertension: Secondary | ICD-10-CM | POA: Diagnosis not present

## 2014-11-21 DIAGNOSIS — E119 Type 2 diabetes mellitus without complications: Secondary | ICD-10-CM | POA: Diagnosis not present

## 2014-11-21 DIAGNOSIS — R45851 Suicidal ideations: Secondary | ICD-10-CM | POA: Diagnosis not present

## 2014-11-21 DIAGNOSIS — F17210 Nicotine dependence, cigarettes, uncomplicated: Secondary | ICD-10-CM | POA: Diagnosis not present

## 2014-11-22 ENCOUNTER — Inpatient Hospital Stay
Admission: EM | Admit: 2014-11-22 | Discharge: 2014-11-30 | DRG: 885 | Disposition: A | Discharge status: Home / Self Care | Payer: Medicare Other | Attending: Psychiatry | Admitting: Psychiatry

## 2014-11-22 DIAGNOSIS — Z6833 Body mass index (BMI) 33.0-33.9, adult: Secondary | ICD-10-CM | POA: Diagnosis not present

## 2014-11-22 DIAGNOSIS — F17200 Nicotine dependence, unspecified, uncomplicated: Secondary | ICD-10-CM | POA: Diagnosis present

## 2014-11-22 DIAGNOSIS — E119 Type 2 diabetes mellitus without complications: Secondary | ICD-10-CM

## 2014-11-22 DIAGNOSIS — F312 Bipolar disorder, current episode manic severe with psychotic features: Principal | ICD-10-CM | POA: Diagnosis present

## 2014-11-22 DIAGNOSIS — E785 Hyperlipidemia, unspecified: Secondary | ICD-10-CM | POA: Diagnosis present

## 2014-11-22 DIAGNOSIS — Z9119 Patient's noncompliance with other medical treatment and regimen: Secondary | ICD-10-CM | POA: Diagnosis present

## 2014-11-22 DIAGNOSIS — E669 Obesity, unspecified: Secondary | ICD-10-CM | POA: Diagnosis present

## 2014-11-22 DIAGNOSIS — F251 Schizoaffective disorder, depressive type: Secondary | ICD-10-CM | POA: Diagnosis not present

## 2014-11-22 DIAGNOSIS — I10 Essential (primary) hypertension: Secondary | ICD-10-CM | POA: Diagnosis present

## 2014-11-22 DIAGNOSIS — F3110 Bipolar disorder, current episode manic without psychotic features, unspecified: Secondary | ICD-10-CM | POA: Diagnosis not present

## 2014-11-22 DIAGNOSIS — F17210 Nicotine dependence, cigarettes, uncomplicated: Secondary | ICD-10-CM | POA: Diagnosis present

## 2014-11-22 DIAGNOSIS — F250 Schizoaffective disorder, bipolar type: Secondary | ICD-10-CM | POA: Diagnosis not present

## 2014-11-22 HISTORY — DX: Obesity, unspecified: E66.9

## 2014-11-22 HISTORY — DX: Essential (primary) hypertension: I10

## 2014-11-22 HISTORY — DX: Hyperlipidemia, unspecified: E78.5

## 2014-11-22 HISTORY — DX: Type 2 diabetes mellitus without complications: E11.9

## 2014-11-22 LAB — ACETAMINOPHEN LEVEL: Acetaminophen: <10 ug/mL

## 2014-11-22 LAB — COMPREHENSIVE METABOLIC PANEL
Albumin: 3.9 g/dL
Alkaline Phosphatase: 47 U/L
Anion Gap: 10 (ref 7–16)
BUN: 11 mg/dL
Bilirubin,Total: 0.4 mg/dL
Calcium, Total: 9.2 mg/dL
Chloride: 100 mmol/L — ABNORMAL LOW
Co2: 26 mmol/L
Creatinine: 0.82 mg/dL
EGFR (African American): >60
EGFR (Non-African Amer.): >60
Glucose: 182 mg/dL — ABNORMAL HIGH
Potassium: 3.6 mmol/L
SGOT(AST): 26 U/L
SGPT (ALT): 27 U/L
Sodium: 136 mmol/L
Total Protein: 7.1 g/dL

## 2014-11-22 LAB — URINALYSIS, COMPLETE
Bacteria: NONE SEEN
Bilirubin,UR: NEGATIVE
Blood: NEGATIVE
Glucose,UR: NEGATIVE mg/dL (ref 0–75)
Ketone: NEGATIVE
Leukocyte Esterase: NEGATIVE
Nitrite: NEGATIVE
Ph: 6 (ref 4.5–8.0)
Protein: NEGATIVE
Specific Gravity: 1.006 (ref 1.003–1.030)
Squamous Epithelial: NONE SEEN

## 2014-11-22 LAB — DRUG SCREEN, URINE
Amphetamines, Ur Screen: NEGATIVE
Barbiturates, Ur Screen: NEGATIVE
Benzodiazepine, Ur Scrn: NEGATIVE
Cannabinoid 50 Ng, Ur ~~LOC~~: NEGATIVE
Cocaine Metabolite,Ur ~~LOC~~: NEGATIVE
MDMA (Ecstasy)Ur Screen: NEGATIVE
Methadone, Ur Screen: NEGATIVE
Opiate, Ur Screen: NEGATIVE
Phencyclidine (PCP) Ur S: NEGATIVE
Tricyclic, Ur Screen: NEGATIVE

## 2014-11-22 LAB — CBC
HCT: 42.3 % (ref 40.0–52.0)
HGB: 13.9 g/dL (ref 13.0–18.0)
MCH: 27.7 pg (ref 26.0–34.0)
MCHC: 32.8 g/dL (ref 32.0–36.0)
MCV: 84 fL (ref 80–100)
Platelet: 275 10*3/uL (ref 150–440)
RBC: 5.01 10*6/uL (ref 4.40–5.90)
RDW: 14.1 % (ref 11.5–14.5)
WBC: 10.1 10*3/uL (ref 3.8–10.6)

## 2014-11-22 LAB — ETHANOL: Ethanol: <5 mg/dL

## 2014-11-22 LAB — VALPROIC ACID LEVEL: Valproic Acid: 42 ug/mL — ABNORMAL LOW (ref 50–100)

## 2014-11-22 LAB — LITHIUM LEVEL: Lithium: 0.3 mmol/L — ABNORMAL LOW (ref 0.60–1.20)

## 2014-11-22 LAB — SALICYLATE LEVEL: Salicylates, Serum: <4 mg/dL

## 2014-11-24 LAB — LIPID PANEL
Cholesterol: 113 mg/dL
HDL Cholesterol: 37 mg/dL — ABNORMAL LOW
Ldl Cholesterol, Calc: 55 mg/dL
Triglycerides: 107 mg/dL
VLDL Cholesterol, Calc: 21 mg/dL

## 2014-11-24 LAB — HEMOGLOBIN A1C: Hemoglobin A1C: 8.4 % — ABNORMAL HIGH

## 2014-11-24 LAB — TSH: Thyroid Stimulating Horm: 3.06 u[IU]/mL

## 2014-11-26 LAB — LITHIUM LEVEL: Lithium: 0.73 mmol/L (ref 0.60–1.20)

## 2014-11-26 LAB — VALPROIC ACID LEVEL: Valproic Acid: 69 ug/mL (ref 50–100)

## 2014-11-27 MED ORDER — METFORMIN HCL 500 MG PO TABS
1000.0000 mg | ORAL_TABLET | Freq: Two times a day (BID) | ORAL | Status: DC
  Administered 2014-11-28 – 2014-11-30 (×5): 1000 mg via ORAL
  Filled 2014-11-27 (×5): qty 2

## 2014-11-27 MED ORDER — OLANZAPINE 10 MG PO TABS: 10.0000 mg | ORAL_TABLET | Freq: Three times a day (TID) | ORAL | Status: DC | PRN

## 2014-11-27 MED ORDER — CLONAZEPAM 0.5 MG PO TABS
0.5000 mg | ORAL_TABLET | Freq: Three times a day (TID) | ORAL | Status: DC
  Administered 2014-11-28 – 2014-11-30 (×5): 0.5 mg via ORAL
  Filled 2014-11-27 (×6): qty 1

## 2014-11-27 MED ORDER — LITHIUM CARBONATE ER 300 MG PO TBCR
600.0000 mg | EXTENDED_RELEASE_TABLET | Freq: Every day | ORAL | Status: DC
  Administered 2014-11-28 – 2014-11-29 (×2): 600 mg via ORAL
  Filled 2014-11-27 (×2): qty 2

## 2014-11-27 MED ORDER — PALIPERIDONE ER 3 MG PO TB24
12.0000 mg | ORAL_TABLET | Freq: Every day | ORAL | Status: DC
  Administered 2014-11-28 – 2014-11-29 (×2): 12 mg via ORAL
  Filled 2014-11-27 (×3): qty 4

## 2014-11-27 MED ORDER — LORAZEPAM 2 MG PO TABS: 2.0000 mg | ORAL_TABLET | Freq: Three times a day (TID) | ORAL | Status: DC | PRN

## 2014-11-27 MED ORDER — ACETAMINOPHEN 325 MG PO TABS: 650.0000 mg | ORAL_TABLET | ORAL | Status: DC | PRN

## 2014-11-27 MED ORDER — METOPROLOL SUCCINATE ER 50 MG PO TB24
50.0000 mg | ORAL_TABLET | Freq: Every day | ORAL | Status: DC
  Administered 2014-11-28 – 2014-11-30 (×3): 50 mg via ORAL
  Filled 2014-11-27 (×4): qty 1

## 2014-11-27 MED ORDER — LITHIUM CARBONATE ER 300 MG PO TBCR
300.0000 mg | EXTENDED_RELEASE_TABLET | Freq: Every day | ORAL | Status: DC
  Administered 2014-11-28 – 2014-11-30 (×3): 300 mg via ORAL
  Filled 2014-11-27 (×4): qty 1

## 2014-11-27 MED ORDER — SIMVASTATIN 10 MG PO TABS
10.0000 mg | ORAL_TABLET | Freq: Every day | ORAL | Status: DC
  Administered 2014-11-28 – 2014-11-29 (×2): 10 mg via ORAL
  Filled 2014-11-27 (×2): qty 1

## 2014-11-27 MED ORDER — DIVALPROEX SODIUM 500 MG PO DR TAB
1000.0000 mg | DELAYED_RELEASE_TABLET | Freq: Two times a day (BID) | ORAL | Status: DC
  Administered 2014-11-28 – 2014-11-30 (×5): 1000 mg via ORAL
  Filled 2014-11-27 (×5): qty 2

## 2014-11-27 MED ORDER — MAGNESIUM HYDROXIDE 400 MG/5ML PO SUSP
30.0000 mL | Freq: Every evening | ORAL | Status: DC | PRN
Start: 2014-11-27 — End: 2014-11-30

## 2014-11-28 DIAGNOSIS — E669 Obesity, unspecified: Secondary | ICD-10-CM | POA: Diagnosis present

## 2014-11-28 DIAGNOSIS — F17200 Nicotine dependence, unspecified, uncomplicated: Secondary | ICD-10-CM | POA: Diagnosis present

## 2014-11-28 DIAGNOSIS — E119 Type 2 diabetes mellitus without complications: Secondary | ICD-10-CM

## 2014-11-28 DIAGNOSIS — I10 Essential (primary) hypertension: Secondary | ICD-10-CM | POA: Diagnosis present

## 2014-11-28 DIAGNOSIS — E785 Hyperlipidemia, unspecified: Secondary | ICD-10-CM | POA: Diagnosis present
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
## 2014-11-28 NOTE — H&P (Signed)
PATIENT NAME:  Greg [name], Greg [name] MR#:  [id number] DATE OF BIRTH:  05/14/1966  DATE OF ADMISSION:  11/22/2014  IDENTIFYING INFORMATION: The patient is a 49 year old single Caucasian male from [place], [place], who receives disability for a diagnosis of bipolar disorder.   CHIEF COMPLAINT:  "There are multiple variables."   HISTORY OF PRESENT ILLNESS:   The patient presented to our Emergency Department on April 25 reporting thoughts of suicidality with the plan of overdosing on his valproic acid medication. "To just peacefully go to the next world."  He reported not taking his medication and having  bad memories about his childhood. This patient is currently followed up by [facility]. He has a diagnosis of bipolar disorder. He has been in our facility at least 3 times in the past. The patient states that he had been engaged for about a month with somebody that he met month ago.  He complained of having low libido with his medications and, therefore, he stopped all of them for  about 2 weeks.  He states that he probably has been "on a hyperactive state" for about 2 weeks. When asked about symptoms, he denied having problems with sleep. He states that he has been sleeping about 6 to 9 hours.  He denied problems with his appetite, energy, or with his concentration. He denies auditory or visual hallucinations, suicidality or homicidality. He denies any problems with his mood and denied having any physical complaints.  His only issue was feeling that the medications were making him feel restless. As far as substance abuse, the patient denies abusing any illicit substances or alcohol. He states that he has not used marijuana in about 30 years and he  only  used it then about 4-5 times. He only drinks rarely.  He smokes about 1 pack of cigarettes a day and sometimes he dips tobacco. During assessment, the patient was pleasant and cooperative for about 15-20 minutes; however, towards the end of the  assessment, in an unpredictable way, the patient became irate and threw a cup full of water in the trash can  splashing all the room and the wall and then stormed out and slamming the door of the office.  The patient is also seen in the hallway speaking gibberish and the social worker reported to the staff today that the patient was seen talking to the phone (he was not holding the auricular, phone was turned off).  PAST PSYCHIATRIC HISTORY: The patient follows up with [facility], has been following up with them for about a year. He has been hospitalized a multitude of times at [facility], [facility], [facility], [facility], [facility], and he has been about 3 times in our facility.   SUICIDAL ATTEMPTS: The patient states that he bought a gun to shoot himself, but did not do it. Denies any actual acts of suicide in the past.  His current medication regimen includes Depakote syrup 1000 mg twice a day, Invega 6 mg daily, lithium 300 mg in the morning and 600 mg in the evening, clonazepam 1 mg every 8 hours as needed.   PAST MEDICAL HISTORY: Positive for hypertension, hypercholesterolemia and borderline diabetes.   CURRENT MEDICATIONS:  Are metformin 1000 mg p.o. b.i.d., pravastatin 20 mg p.o. daily, and lisinopril 5 mg p.o. daily.   The patient denies any history of seizures or head trauma.   SOCIAL HISTORY: The patient has been on disability since 1992 and his disability  was granted on the basis of severe bipolar disorder.  Not much of his social history is known at this time other than he follows up with [facility].   ALLERGIES: THE PATIENT REPORTS BEING ALLERGIC TO HALDOL AND THORAZINE AND ALSO RISPERDAL  AND SEROQUEL ARE LISTED UNDER HIS ALLERGIES IN OUR SYSTEM.   REVIEW OF SYSTEMS: The patient denies any physical complaints today. Denies nausea, vomiting, or diarrhea. The rest of the 10 review of systems is negative.   PHYSICAL EXAMINATION: GENERAL: The  patient is a 49 year old obese, Caucasian male who appears his stated age. He is in no acute distress.  VITAL SIGNS: Blood pressure 134/84, respirations 20, pulse 88, temperature 98.2.  MUSCULOSKELETAL: Has a normal gait, normal muscular tone. No evidence of involuntary movements.   MENTAL STATUS EXAMINATION: The patient is a 49 year old obese Caucasian male.  Appearance:  He appears his stated age. He is wearing a black shirt and jeans. He looks disheveled. He was initially cooperative during the assessment but in an unpredictable manner, became irate to where he slammed the door and  threw a cup full of water in the trash can of the  office splashing the whole office and the wall. Psychomotor activity was restless.  The patient was constantly switching positions during the assessment. Eye contact was fair. His speech had had a regular tone, volume, and rate. Thought process at times had loose associations but at other times his thought process was linear. Thought content negative for suicidality, homicidality, or auditory or visual hallucinations. However, per examination, he was seen interacting to internal stimuli and talking on with  unintelligible words. His mood is irritable. His  mood is labile. His affect is constricted. Insight and judgment are impaired. Attention and concentration are impaired.  Fund of knowledge appears to be average for his level of education. Cognitive examination:  He is alert and oriented to person, place, time, and situation.   LABORATORY RESULTS: BUN 11, creatinine 0.82, sodium 136, potassium 3.6, calcium 9.2. Alcohol was below detection limit. AST 26, ALT 27, lithium level 0.3, valproic level 42. Urine toxicology was negative. WBC was 10.1, hemoglobin 13.9, hematocrit 42.3, platelet count 275. Urinalysis is clear. Acetaminophen level was less than 10. Salicylate level was less than 4.   DIAGNOSES: 1. Bipolar disorder type I, current episode manic, severe, with psychotic  features.  2. Tobacco use disorder, severe.   3. Obesity.  4. Hypertension.  5. Dyslipidemia.  6. Borderline diabetes.   ASSESSMENT:  The patient is a 49 year old white male with history of bipolar disorder who has become manic and psychotic over the last 2 weeks after being noncompliant with his antipsychotics and mood stabilizers due to complaints of low libido. The patient is currently psychotic and labile, becomes easily agitated and appears unpredictable.   PLAN: For bipolar disorder, the patient will be continued on Invega; however, I will increase the dose to 12 mg p.o. daily instead of 6.  I will continue the Depakote at 1000 mg twice a day and lithium 300 mg in the morning and 600 mg in the evening.  For agitation, the patient will be started on Ativan 2 mg every 8 hours and olanzapine 10 mg every 8 hours as needed for agitation. Also will order Ativan p.r.n. for agitation. For hypertension, I will discontinue the lisinopril as this medication can increase risk of lithium toxicity.  Instead, I will start him on metoprolol  XL 25 mg p.o. daily as his heart rate was elevated.  For dyslipidemia, he will be continued on pravastatin 20 mg p.o. daily. For borderline diabetes, he will be continued on metformin 1000 mg p.o. b.i.d.   LABORATORY RESULTS:  I will order a TSH, a lipid panel and a hemoglobin A1c.  In a few days, I will recheck his lithium and Depakote level.   HOSPITALIZATION STATUS:  He will be continued on involuntary commitment precautions.  He will continue on suicide precautions for now.     ____________________________ Hildred Priest, MD ahg:tr D: 11/23/2014 16:11:27 ET T: 11/23/2014 17:41:31 ET JOB#: [id number]  cc: Hildred Priest, MD, <Dictator> Rhodia Albright MD ELECTRONICALLY SIGNED 11/24/2014 14:17

## 2014-11-28 NOTE — Consult Note (Signed)
PATIENT NAME:  Greg [name], Greg [name] MR#:  [id number] DATE OF BIRTH:  Dec 10, 1965  DATE OF CONSULTATION:  09/18/2014  CONSULTING PHYSICIAN:  Verbena Boeding K. Jerek Meulemans, MD  SUBJECTIVE:  The patient was seen in consultation in [facility] [facility] [place].  The patient is a 49 year old white male, not employed, not married and lives with his parents.  The patient has a long history of mental illness and carries the diagnoses of schizoaffective disorder, bipolar.  The patient reports that he is being followed as outpatient at mental health mental clinic and has been on medications.  According to information obtained from the chart the patient stopped taking his medication and not been sleeping and is manic.  The patient absolutely denies these statements.  He stated that he has taken his medication.  The last medication that he has taken was more than 12 hours ago.  The patient reports that his mother gives his medications and she asks him to open the mouth and puts the medication in his mouth.    CURRENT MEDICATIONS:  As follows: Lithium 1200 mg everyday, Abilify and does not remember the dose.    MENTAL STATUS: The patient has a very disheveled appearance, alert and oriented with prompting and help.  Denies feeling depressed. Denies being hopeless or helpless. Speech is pressured and appears to be having racing thoughts as he had difficulty with focus and attention.  He denies any psychotic symptoms. Denies auditory or visual hallucinations, except sometimes he hears some voices; that is what he stated.  He denies any ideas or plans to hurt himself.  He contracts for safety.  Insight and judgment are guarded.  Impulse control good.    IMPRESSION:   1.  Schizoaffective disorder. 2.  Bipolar, current episode manic.  PLAN:  Continue the medications as stated above and recommended inpatient hospitalization on psychiatry for close observation, evaluation, help, and stabilization.      ____________________________ Jannet MantisSurya K. Guss [place], MD skc:at D: 09/18/2014 17:56:57 ET T: 09/18/2014 18:46:42 ET JOB#: [id number]  cc: Monika SalkSurya K. Guss [place], MD, <Dictator> Beau FannySURYA K Yojan Paskett MD ELECTRONICALLY SIGNED 09/25/2014 16:22

## 2014-11-28 NOTE — Progress Notes (Addendum)
[facility] MD Progress Note   11/28/2014 11:25 AM Greg [name].  MRN:  [id number] Subjective:  Patient discussed events that led up to his admission. He  Principal Problem: <principal problem not specified> Diagnosis:   Patient Active Problem List   Diagnosis Date Noted  . Schizoaffective disorder [F25.9] 11/27/2014   Total Time spent with patient: 20 minutes   Past Medical History: No past medical history on file. No past surgical history on file. Family History: No family history on file. Social History:  History  Alcohol Use: Not on file     History  Drug Use Not on file    History   Social History  . Marital Status: Single    Spouse Name: N/A  . Number of Children: N/A  . Years of Education: N/A   Social History Main Topics  . Smoking status: Not on file  . Smokeless tobacco: Not on file  . Alcohol Use: Not on file  . Drug Use: Not on file  . Sexual Activity: Not on file   Other Topics Concern  . Not on file   Social History Narrative  . No narrative on file   Additional History:    Sleep: Good  Appetite:  Good   Assessment:   Musculoskeletal: Strength & Muscle Tone: within normal limits Gait & Station: normal Patient leans: N/A   Psychiatric Specialty Exam: Physical Exam  Review of Systems  Psychiatric/Behavioral: Negative for depression, suicidal ideas, hallucinations and memory loss. The patient does not have insomnia.     Blood pressure 158/103, pulse 94, temperature 98.2 F (36.8 C), temperature source Oral, resp. rate 18, height  (1.778 m), weight 106.595 kg (235 lb).Body mass index is 33.72 kg/(m^2).  General Appearance: Casual  Eye Contact::  Good  Speech:  Clear and Coherent  Volume:  Normal  Mood:  Euphoric  Affect:  Appropriate  Thought Process:  Linear  Orientation:  Full (Time, Place, and Person)  Thought Content:  Negative  Suicidal Thoughts:  No  Homicidal Thoughts:  No  Memory:  Remote;   Good  Judgement:  Fair   Insight:  Fair  Psychomotor Activity:  Normal  Concentration:  Good  Recall:  Good  Fund of Knowledge:Fair  Language: Good  Akathisia:  No  Handed:  Unknown  AIMS (if indicated):     Assets:  Social Support  ADL's:  Intact  Cognition: WNL  Sleep:  Number of Hours: 7.5     Current Medications: Current Facility-Administered Medications  Medication Dose Route Frequency Provider Last Rate Last Dose  . acetaminophen (TYLENOL) tablet 650 mg  650 mg Oral Q4H PRN Doctor Chlconversion, MD      . clonazePAM Scarlette [facility]) tablet 0.5 mg  0.5 mg Oral 3 times per day Doctor Chlconversion, MD   0.5 mg at 11/28/14 0611  . divalproex (DEPAKOTE) DR tablet 1,000 mg  1,000 mg Oral BID WC Doctor Chlconversion, MD   1,000 mg at 11/28/14 0920  . lithium carbonate (LITHOBID) CR tablet 300 mg  300 mg Oral Daily Doctor Chlconversion, MD   300 mg at 11/28/14 0920  . lithium carbonate (LITHOBID) CR tablet 600 mg  600 mg Oral QHS Doctor Chlconversion, MD      . LORazepam (ATIVAN) tablet 2 mg  2 mg Oral Q8H PRN Doctor Chlconversion, MD      . magnesium hydroxide (MILK OF MAGNESIA) suspension 30 mL  30 mL Oral QHS PRN Doctor Chlconversion, MD      .  metFORMIN (GLUCOPHAGE) tablet 1,000 mg  1,000 mg Oral BID WC Doctor Chlconversion, MD   1,000 mg at 11/28/14 0920  . metoprolol succinate (TOPROL-XL) 24 hr tablet 50 mg  50 mg Oral Daily Doctor Chlconversion, MD   50 mg at 11/28/14 0920  . OLANZapine (ZYPREXA) tablet 10 mg  10 mg Oral TID PRN Doctor Chlconversion, MD      . paliperidone (INVEGA) 24 hr tablet 12 mg  12 mg Oral Daily Doctor Chlconversion, MD   12 mg at 11/28/14 0920  . simvastatin (ZOCOR) tablet 10 mg  10 mg Oral QHS Doctor Chlconversion, MD        Lab Results: No results found for this or any previous visit (from the past 48 hour(s)).  Physical Findings: Denied any physical complaints.  Treatment Plan Summary: Daily contact with patient to assess and evaluate symptoms and progress in treatment and  medication management.    Medical Decision Making:  Established Problem, Stable/Improving (1). Continue current treatment plan and current medications.  Face to face time 20 minutes.   Kerin SalenWilliams, Almeter Westhoff L 11/28/2014, 11:25 AM

## 2014-11-28 NOTE — Consult Note (Signed)
Brief Consult Note: Diagnosis: Schizoaffective Disorder, Bipolar type.   Patient was seen by consultant.   Orders entered.   Comments: Pt seen in ED B[facility]. He  presented due to non complaince with meds and having manic episode. he lives with his family and has been having manic episode and driving his car erratically. He stated that he does not remeber the episode. He denied perceptual disturbances at this time. Unable to contract for safety and will be admitted for stablization and safety.  Electronic Signatures: Rhunette CroftFaheem, Rodric Punch S (MD)  (Signed [phone]-Feb-16 11:22)  Authored: Brief Consult Note   Last Updated: 22-Feb-16 11:22 by Rhunette CroftFaheem, Nicholle Falzon S (MD)

## 2014-11-28 NOTE — Consult Note (Signed)
Brief Consult Note: Diagnosis: Schizoaffective disorder.   Patient was seen by consultant.   Recommend further assessment or treatment.   Orders entered.   Comments: Pt seen in Ed [facility]. He has h/o schizoaffective DO and non-compliance with medications. He was noted to be trembling, talking in gibberish and loud. He did not make any sense and was paranoid, responding to internal stimuli. Staff reported that he was fine this morning but suddenly he started acting wired. He has been non-compliant with his meds and his Dierdre SearlesLi and Depakote levels are sub therapeutic. He did not answer any of my questions in appropriate manner.   Plan;  [facility] and safety Will restart Lithium 300mg  po BID Will restart Depakote 250mg  po BID Continue Zyprexa 5mg  po q8hrs prn Continue to monitor.  Electronic Signatures: Rhunette CroftFaheem, Jameya Pontiff S (MD)  (Signed 25-Apr-16 12:10)  Authored: Brief Consult Note   Last Updated: 25-Apr-16 12:10 by Rhunette CroftFaheem, Letetia Romanello S (MD)

## 2014-11-28 NOTE — [facility] Group Notes (Addendum)
[facility] Group Notes:  (Nursing/MHT/Case Management/Adjunct)  Date:  11/28/2014  Time:  9:51 PM  Type of Therapy:  Group Therapy  Participation Level:  attended  Participation Quality:  Appropriate   Affect:  appropriate  Cognitive: alert  Insight:  appropriate  Engagement in Group: active  Modes of Intervention: education  Summary of Progress/Problems:  Greg MunroeClarence [name] [name] 11/28/2014, 9:51 PM

## 2014-11-29 ENCOUNTER — Encounter: Payer: Self-pay | Admitting: Psychiatry

## 2014-11-29 MED ORDER — METOPROLOL SUCCINATE ER 50 MG PO TB24: 50.0000 mg | ORAL_TABLET | Freq: Every day | ORAL | Status: DC

## 2014-11-29 MED ORDER — AMANTADINE HCL 100 MG PO CAPS
100.0000 mg | ORAL_CAPSULE | Freq: Two times a day (BID) | ORAL | Status: DC
  Administered 2014-11-29 – 2014-11-30 (×3): 100 mg via ORAL
  Filled 2014-11-29 (×4): qty 1

## 2014-11-29 MED ORDER — PALIPERIDONE ER 9 MG PO TB24: 9.0000 mg | ORAL_TABLET | ORAL | Status: DC

## 2014-11-29 MED ORDER — CLONAZEPAM 0.5 MG PO TABS: 0.5000 mg | ORAL_TABLET | Freq: Three times a day (TID) | ORAL | Status: DC

## 2014-11-29 MED ORDER — LITHIUM CARBONATE ER 300 MG PO TBCR: 600.0000 mg | EXTENDED_RELEASE_TABLET | Freq: Every day | ORAL | Status: DC

## 2014-11-29 MED ORDER — DIVALPROEX SODIUM 500 MG PO DR TAB: 1000.0000 mg | DELAYED_RELEASE_TABLET | Freq: Two times a day (BID) | ORAL | Status: DC

## 2014-11-29 MED ORDER — LITHIUM CARBONATE ER 300 MG PO TBCR: 300.0000 mg | EXTENDED_RELEASE_TABLET | Freq: Every day | ORAL | Status: DC

## 2014-11-29 MED ORDER — METFORMIN HCL 1000 MG PO TABS: 1000.0000 mg | ORAL_TABLET | Freq: Two times a day (BID) | ORAL | Status: DC

## 2014-11-29 MED ORDER — SIMVASTATIN 10 MG PO TABS: 10.0000 mg | ORAL_TABLET | Freq: Every day | ORAL | Status: DC

## 2014-11-29 MED ORDER — AMANTADINE HCL 100 MG PO CAPS: 100.0000 mg | ORAL_CAPSULE | Freq: Two times a day (BID) | ORAL | Status: DC

## 2014-11-29 MED ORDER — NICOTINE 14 MG/24HR TD PT24
14.0000 mg | MEDICATED_PATCH | Freq: Every day | TRANSDERMAL | Status: DC
  Administered 2014-11-29 – 2014-11-30 (×2): 14 mg via TRANSDERMAL
  Filled 2014-11-29 (×3): qty 1

## 2014-11-29 NOTE — [facility] Group Notes (Signed)
[facility] Group Notes:  (Nursing/MHT/Case Management/Adjunct)  Date:  11/29/2014  Time:  12:32 PM  Type of Therapy:  Psychoeducational Skills  Participation Level:  Did Not Attend  Participation Quality:  n/a  Affect:  n/a  Cognitive:  n/a  Insight:  None  Engagement in Group:  n/a  Modes of Intervention:  n/a  Summary of Progress/Problems:  Greg [name] Assata [name] 11/29/2014, 12:32 PM

## 2014-11-29 NOTE — Progress Notes (Signed)
D: Patient denies SI/HI/AVH. Patient rates hopelessness as 3,  depression as 3, and anxiety as 4.  Patient affect and mood is appropriate.  Patient states, "I'm just working through some things and I feel great."  Patient did NOT attend evening group. Patient visible on the milieu. No distress noted. A: Support and encouragement offered. Scheduled medications given to pt. Q 15 min checks continued for patient safety. R: Patient receptive. Patient remains safe on the unit.

## 2014-11-29 NOTE — Plan of Care (Signed)
Problem: Alteration in thought process Goal: LTG-Patient verbalizes understanding importance med regimen (Patient verbalizes understanding of importance of medication regimen and need to continue outpatient care.)  Outcome: Progressing Patient denies having any hallucinations.

## 2014-11-29 NOTE — Progress Notes (Signed)
[facility]   11/29/2014 2:59 PM Greg Wilhemina CashF Stallworth Jr.  MRN:  [id number] Subjective:  Patient patient reports feeling much improved. He denies having racing thoughts increased energy or difficulties with concentration or attention. He denies having any issues with his sleep. Denies any suicidality or homicidality or having auditory or visual hallucinations. As far as side effects patient complains of being restless.  Denies having any other side effects. As far as physical complaints he denies having any physical complaints today. Patient was seen today attending groups no longer reporting feeling agitated by peers. Principal Problem: Bipolar I disorder, most recent episode (or current) manic Diagnosis:   Patient Active Problem List   Diagnosis Date Noted   Bipolar I disorder, most recent episode (or current) manic [F31.10] 11/28/2014   HTN (hypertension) [I10] 11/28/2014   Dyslipidemia [E78.5] 11/28/2014   Obesity [E66.9] 11/28/2014   Diabetes [E11.9] 11/28/2014   Tobacco use disorder [Z72.0] 11/28/2014   Total Time spent with patient: 20 minutes   Past Medical History: No past medical history on file. No past surgical history on file. Family History: No family history on file. Social History:  History  Alcohol Use No     History  Drug Use No    History   Social History   Marital Status: Single    Spouse Name: N/A   Number of Children: N/A   Years of Education: N/A   Social History Main Topics   Smoking status: Current Every Day Smoker   Smokeless tobacco: Not on file   Alcohol Use: No   Drug Use: No   Sexual Activity: Not on file   Other Topics Concern   Not on file   Social History Narrative   No narrative on file   Additional History:    Sleep: Good  Appetite:  Good   Assessment:   Musculoskeletal: Strength & Muscle Tone: within normal limits Gait & Station: normal Patient leans: N/A   Psychiatric Specialty Exam: Physical  Exam   Review of Systems  Gastrointestinal: Negative for nausea, vomiting, abdominal pain and diarrhea.  Musculoskeletal: Negative for back pain and joint pain.  Neurological: Negative for weakness and headaches.  Psychiatric/Behavioral: Negative for depression, suicidal ideas and hallucinations. The patient does not have insomnia.     Blood pressure 135/86, pulse 91, temperature 99 F (37.2 C), temperature source Oral, resp. rate 18, height 5\' 10"  (1.778 m), weight 106.595 kg (235 lb).Body mass index is 33.72 kg/(m^2).  General Appearance: Casual  Eye Contact::  Good  Speech:  Clear and Coherent  Volume:  Normal  Mood:  Euthymic  Affect:  Appropriate  Thought Process:  Linear  Orientation:  Full (Time, Place, and Person)  Thought Content:  Negative  Suicidal Thoughts:  No  Homicidal Thoughts:  No  Memory:  Remote;   Good  Judgement:  Fair  Insight:  Fair  Psychomotor Activity:  Normal  Concentration:  Good  Recall:  Good  Fund of Knowledge:Fair  Language: Good  Akathisia:  No  Handed:  Unknown  AIMS (if indicated):     Assets:  Social Support  ADL's:  Intact  Cognition: WNL  Sleep:  Number of Hours: 8     Current Medications: Current Facility-Administered Medications  Medication Dose Route Frequency Provider Last Rate Last Dose   acetaminophen (TYLENOL) tablet 650 mg  650 mg Oral Q4H PRN Doctor Chlconversion, MD       amantadine (SYMMETREL) capsule 100 mg  100 mg Oral BID  Jimmy Footman, MD   100 mg at 11/29/14 1439   clonazePAM [facility]) tablet 0.5 mg  0.5 mg Oral 3 times per day Doctor Chlconversion, MD   0.5 mg at 11/29/14 1439   divalproex (DEPAKOTE) DR tablet 1,000 mg  1,000 mg Oral BID WC Doctor Chlconversion, MD   1,000 mg at 11/29/14 0802   lithium carbonate (LITHOBID) CR tablet 300 mg  300 mg Oral Daily Doctor Chlconversion, MD   300 mg at 11/29/14 4540   lithium carbonate (LITHOBID) CR tablet 600 mg  600 mg Oral QHS Doctor Chlconversion, MD    600 mg at 11/28/14 2128   LORazepam (ATIVAN) tablet 2 mg  2 mg Oral Q8H PRN Doctor Chlconversion, MD       magnesium hydroxide (MILK OF MAGNESIA) suspension 30 mL  30 mL Oral QHS PRN Doctor Chlconversion, MD       metFORMIN (GLUCOPHAGE) tablet 1,000 mg  1,000 mg Oral BID WC Doctor Chlconversion, MD   1,000 mg at 11/29/14 0803   metoprolol succinate (TOPROL-XL) 24 hr tablet 50 mg  50 mg Oral Daily Doctor Chlconversion, MD   50 mg at 11/29/14 0935   OLANZapine (ZYPREXA) tablet 10 mg  10 mg Oral TID PRN Doctor Chlconversion, MD       paliperidone (INVEGA) 24 hr tablet 12 mg  12 mg Oral Daily Doctor Chlconversion, MD   12 mg at 11/29/14 0935   simvastatin (ZOCOR) tablet 10 mg  10 mg Oral QHS Doctor Chlconversion, MD   10 mg at 11/28/14 2129    Lab Results: No results found for this or any previous visit (from the past 48 hour(s)).  Physical Findings: Denied any physical complaints.  Treatment Plan Summary: Daily contact with patient to assess and evaluate symptoms and progress in treatment and medication management.  Bipolar disorder continue invega at 12 mg by mouth daily.  Continue lithium CR 300 mg in the morning and 600 mg in the evening continued Depakote DR 5000 mg by mouth twice a day. Will check Depakote levels lithium levels in CMP tomorrow a.m. I have contacted the act team report patient has declined from receiving Gean [place]chwood will we'll offer it prior to discharge.  Akathisia continue amantadine 100 mg by mouth twice a day and clonazepam 0.5 mg 3 times a day  Hypertension continue metoprolol XL 50 mg by mouth every morning   Dyslipidemia continue Zocor 10 mg by mouth daily at bedtime    Diabetes continue metformin 1000 mg by mouth twice a day with meals  Tobacco use disorder continue Nicotrol inhaler  Discharge planning consider possible discharge tomorrow social worker to contact patient's family today see if he can return to his parent's house.          Medical Decision Making:  Established Problem, Stable/Improving (1). Continue current treatment plan and current medications.    Radene Journey 11/29/2014, 2:59 PM

## 2014-11-29 NOTE — Progress Notes (Signed)
[place] SW  met with patient and Quita Skye from [place]. Act team worker had a 1-1 face to face with patient. Patient reported he is feeling much better and less shaky today. He is happy that his medications are helping his mood. Act team worker Quita Skye is willing to see pt 2x week once he is discharged home over the next 4-6 weeks for additional support. Patient did sign an extra consent for [facility] Act team.

## 2014-11-29 NOTE — Progress Notes (Signed)
Recreation Therapy Notes  INPATIENT RECREATION THERAPY ASSESSMENT  Patient Details Name: Greg CoxRichard F Barfoot Jr. MRN: [id number] DOB: 06/14/1966 Today's Date: 11/29/2014   Assessment completed 04.27.16 at 12:53 pm.  Patient Stressors: Family, Relationship  Coping Skills:   Isolate, Avoidance, Self-Injury, Talking, Music, Other (Comment) ("Give thanks to Father in [place]")  Personal Challenges: Anger, Concentration, Decision-Making, Expressing Yourself, Relationships, Self-Esteem/Confidence, Social Interaction, Stress Management, Time Management, Trusting Others  Leisure Interests (2+):  Sports - Other (Comment), Individual - Other (Comment) (Tennis and working on car)  Biochemist, clinicalAwareness of Community Resources:  Yes  Community Resources:  Park  Current Use: No  If no, Barriers?: Other (Comment) ("been sidetracked")  Patient Strengths:  friendly and funny  Patient Identified Areas of Improvement:  being raptured and getting out of here  Current Recreation Participation:  surviving, not doing anything fun  Patient Goal for Hospitalization:  To give people their space. Hoping to get out of here as soon as he can and get back to his life  [place]  [place]   Current [place] (including self-harm):  No  Current HI:  No  Consent to Intern Participation: N/A   Jacquelynn Creelizabeth M Marleni Gallardo, LRT/CTRS 11/29/2014, 5:55 PM

## 2014-11-29 NOTE — Progress Notes (Signed)
Initial Nutrition Assessment  DOCUMENTATION CODES:   INTERVENTION: Meals and Snacks: Cater to patient preferences   NUTRITION DIAGNOSIS: No nutrition concerns at this time   GOAL:  Energy Intake: goal for patient to meet >90% of estimated needs    MONITOR:  PO intake, labs  REASON FOR ASSESSMENT: Length of Stay  ASSESSMENT:  Reason For Admission: admitted with schizoaffective PMHx: HTN, HLD, Obesity, DM Current Diet: Regular Typical Fluid/ Food Intake: 100% of most meals recorded per I/O Meal/ Snack Patterns: Unable to assess  Labs:  Electrolyte and Renal Profile:  Reviewed Glucose Profile: No results for input(s): GLUCAP in the last 72 hours.  Meds: Zocor  UOP: Reviewed Digestive System: Last BM 5/1  Weight Changes: Unable to assess recent weight trends. Reviewed medical record x 1 year ago, weight 222# x 1 year, a gain of 5.8% x 1 year- not significant    Height:  Ht Readings from Last 1 Encounters:  11/26/14 5\' 10"  (1.778 m)    Weight:  Wt Readings from Last 1 Encounters:  11/26/14 235 lb (106.595 kg)    Ideal Body Weight:     Wt Readings from Last 10 Encounters:  11/26/14 235 lb (106.595 kg)    BMI:  Body mass index is 33.72 kg/(m^2).  Skin: No concerns  Diet Order:  Diet regular Room service appropriate?: Yes; Fluid consistency:: Thin  EDUCATION NEEDS:  No education needs identified at this time   Intake/Output Summary (Last 24 hours) at 11/29/14 1624 Last data filed at 11/29/14 1300  Gross per 24 hour  Intake   1080 ml  Output      0 ml  Net   1080 ml    Last BM:  5/1  Joeseph Amorracey L. Gaines, RDN [phone]  LOW Care Level

## 2014-11-29 NOTE — Progress Notes (Signed)
Recreation Therapy Notes  Recreation Therapy Group Note Template    Date: [id number] Time: 3:00 PM Location: Craft Room  Group Topic: Self-Expression  Goal Area(s) Addresses:  Patient will identify one color per emotion listed on wheel. Patient will verbalize benefit of using art as a means of self-expression. Patient will verbalize one emotion experienced during session. Patient will be educated on other forms of self-expression.  Behavioral Response: Did not attend  Intervention: Emotion Wheel  Activity: Patients were given a worksheet with 7 different emotions. Patients were instructed to pick a color for each emotion and color the designated section.   Education: LRT educated patients on self-expression and different forms of self-expression.   Education Outcome: Patient did not attend group.  Clinical Observations/Feedback: Patient did not attend group.    Jacquelynn CreeElizabeth M Greene, LRT/CTRS 11/29/2014 4:25 PM

## 2014-11-29 NOTE — [facility] Group Notes (Signed)
[facility] Group Notes:  (Nursing/MHT/Case Management/Adjunct)  Date:  11/29/2014  Time:  6:11 PM  Type of Therapy:  recreation  Participation Level:  Did Not Attend  Participation Quality:  n/a  Affect:  n/a  Cognitive:  n/a  Insight:  None  Engagement in Group:  n/a  Modes of Intervention:  n/a  Summary of Progress/Problems:  Greg [name] 11/29/2014, 6:11 PM

## 2014-11-29 NOTE — Progress Notes (Signed)
Patient rates his depression a "1"; denies any SI and contracts for safety; denies any anxiety; denies any hallucinations; has been calm, quiet, and cooperative throughout the shift; no voiced complaints; continue to monitor.

## 2014-11-29 NOTE — Progress Notes (Signed)
In & out of room.Appropriate with staff & peers.Compliant with meds.Rated his depression 3/10.Denies suicidal ideation.Stated that he is trying to learn better coping skills.Appetite good.

## 2014-11-30 LAB — COMPREHENSIVE METABOLIC PANEL
ALT: 14 U/L — ABNORMAL LOW (ref 17–63)
AST: 15 U/L (ref 15–41)
Albumin: 3.7 g/dL (ref 3.5–5.0)
Alkaline Phosphatase: 37 U/L — ABNORMAL LOW (ref 38–126)
Anion gap: 6 (ref 5–15)
BUN: 19 mg/dL (ref 6–20)
CO2: 29 mmol/L (ref 22–32)
Calcium: 9.1 mg/dL (ref 8.9–10.3)
Chloride: 103 mmol/L (ref 101–111)
Creatinine, Ser: 0.8 mg/dL (ref 0.61–1.24)
GFR calc Af Amer: >60 mL/min (ref >60)
GFR calc non Af Amer: >60 mL/min (ref >60)
Glucose, Bld: 97 mg/dL (ref 65–99)
Potassium: 4.2 mmol/L (ref 3.5–5.1)
Sodium: 138 mmol/L (ref 135–145)
Total Bilirubin: 0.6 mg/dL (ref 0.3–1.2)
Total Protein: 7.1 g/dL (ref 6.5–8.1)

## 2014-11-30 LAB — VALPROIC ACID LEVEL: Valproic Acid Lvl: 108 ug/mL — ABNORMAL HIGH (ref 50.0–100.0)

## 2014-11-30 LAB — LITHIUM LEVEL: Lithium Lvl: 0.79 mmol/L (ref 0.60–1.20)

## 2014-11-30 MED ORDER — PALIPERIDONE PALMITATE 234 MG/1.5ML IM SUSP
234.0000 mg | Freq: Once | INTRAMUSCULAR | Status: AC
  Administered 2014-11-30: 234 mg via INTRAMUSCULAR
  Filled 2014-11-30: qty 1.5

## 2014-11-30 MED ORDER — PALIPERIDONE PALMITATE 156 MG/ML IM SUSP: 156.0000 mg | Freq: Once | INTRAMUSCULAR | Status: DC

## 2014-11-30 MED ORDER — NICOTINE 14 MG/24HR TD PT24: 14.0000 mg | MEDICATED_PATCH | Freq: Every day | TRANSDERMAL | Status: DC

## 2014-11-30 MED ORDER — PALIPERIDONE ER 3 MG PO TB24
9.0000 mg | ORAL_TABLET | Freq: Every day | ORAL | Status: DC
  Administered 2014-11-30: 9 mg via ORAL
  Filled 2014-11-30: qty 3

## 2014-11-30 NOTE — [facility] Group Notes (Signed)
Adult Psychoeducational Group Note  Date:  11/30/2014 Time:  9:36 AM  Group Topic/Focus:  Overcoming Stress:   The focus of this group is to define stress and help patients assess their triggers.  Participation Level:  Did Not Attend  Participation Quality:  n/a  Affect:  n/a  Cognitive:  n/a  Insight: n/a  Engagement in Group:  n/a  Modes of Intervention:  n/a  Additional Comments:  Pt did not attend this group.   Beryl MeagerIngle, Evone Arseneau T 11/30/2014, 9:36 AM

## 2014-11-30 NOTE — Discharge Planning (Signed)
LCSW met with patient and he will follow up with [facility] Act team. He will contact his worker Quita Skye [phone] who will come out for 2 visits a week. He will return to his home and will be supported by his mother. In our meeting our patient reports he is not suicidal,or homicidal or experiencing visual or audio hallucinations at this time.

## 2014-11-30 NOTE — [facility] Group Notes (Signed)
[facility]  11/30/2014 9:01 AM  Participation Quality:  Did not attend group  Affect:  n/a  Cognitive:  n/a  Insight:  n/a  Engagement in Group:  n/a  Modes of Intervention:  n/a  Summary of Progress/Problems: Pt did not attend this group.   Beryl MeagerIngle, Domenic Schoenberger T 11/30/2014, 9:01 AM

## 2014-11-30 NOTE — [facility] Group Notes (Signed)
[facility] Group Notes:  (Nursing/MHT/Case Management/Adjunct)  Date:  11/30/2014  Time:  12:13 PM  Type of Therapy:  Group Therapy  Participation Level:  Active  Participation Quality:  Appropriate  Affect:  Appropriate  Cognitive:  Appropriate  Insight:  Good  Engagement in Group:  Engaged  Modes of Intervention:  Discussion and Education  Summary of Progress/Problems:  Greg [name] M Moses Ellison 11/30/2014, 12:13 PM

## 2014-11-30 NOTE — Plan of Care (Signed)
Problem: [facility] Participation in Recreation Therapeutic Interventions Goal: STG-Patient will demonstrate improved self esteem by identif STG: Self-Esteem - Within 5 treatment sessions, patient will verbalize at least 5 positive positive affirmation statements in each of 4 treatment sessions to increase self-esteem post d/c.  Outcome: Not Met (add Reason) Treatment session 1: Patient verbalized 5 positive affirmation statements. Patient reported it felt "great". LRT encouraged patient to continue saying positive affirmation statements.  Goal not met due to patient planning to d/c today.  Leonette Monarch, LRT/CTRS 05.03.16 10:28 am Goal: STG-Other Recreation Therapy Goal (Specify) STG: Stress Management - Within 5 treatment session, patient will demonstrate at least one stress management technique in each of 2 treatment sessions to increase stress management skills post d/c.  Outcome: Not Met (add Reason) Treatment session 1: LRT educated and provided patient with handouts on stress management techniques. Patient verbalized understanding. LRT encouraged patient to read over and practice the stress management techniques.   Goal not met due to patient planning to d/c today.  Leonette Monarch, LRT/CTRS 05.03.16 10:31 am

## 2014-11-30 NOTE — [facility] Suicide Risk Assessment (Signed)
[facility] Discharge Suicide Risk Assessment   Demographic Factors:  Male, Caucasian and Single  Total Time spent with patient: 45 minutes  Musculoskeletal: Strength & Muscle Tone: within normal limits Gait & Station: normal Patient leans: N/A  Psychiatric Specialty Exam: Physical Exam  Review of Systems  Respiratory: Negative for cough.   Cardiovascular: Negative for chest pain and palpitations.  Gastrointestinal: Negative for nausea, vomiting, abdominal pain, diarrhea, constipation and blood in stool.  Neurological: Negative for headaches.  Psychiatric/Behavioral: Negative for depression, suicidal ideas, hallucinations, memory loss and substance abuse. The patient is not nervous/anxious and does not have insomnia.     Blood pressure 135/86, pulse 91, temperature 99 F (37.2 C), temperature source Oral, resp. rate 18, height  (1.778 m), weight 106.595 kg (235 lb).Body mass index is 33.72 kg/(m^2).  General Appearance: Fairly Groomed  Patent attorney::  Good  Speech:  Normal Rate409  Volume:  Normal  Mood:  Euthymic  Affect:  Congruent  Thought Process:  Linear and Logical  Orientation:  Full (Time, Place, and Person)  Thought Content:  Hallucinations: None  Suicidal Thoughts:  No  Homicidal Thoughts:  No  Memory:  Immediate;   Good Recent;   Good Remote;   Good  Judgement:  Fair  Insight:  Fair  Psychomotor Activity:  Normal  Concentration:  Fair  Recall:  Good  Fund of Knowledge:Good  Language: Good  Akathisia:  Yes  Handed:  Right  AIMS (if indicated):     Assets:  Financial Resources/Insurance Housing Social Support  Sleep:  Number of Hours: 9  Cognition: WNL  ADL's:  Intact      Has this patient used any form of tobacco in the last 30 days? (Cigarettes, Smokeless Tobacco, Cigars, and/or Pipes) Yes, A prescription for an FDA-approved tobacco cessation medication was offered at discharge and the patient refused  Mental Status Per Nursing Assessment::   On  Admission:     Current Mental Status by Physician: No suicidal future oriented and hopeful  Loss Factors: NA  Historical Factors: No prior history of suicidal attempts  Risk Reduction Factors:   Sense of responsibility to family, Living with another person, especially a relative, Positive social support and Positive therapeutic relationship  Continued Clinical Symptoms:  Bipolar mania (resolved)  Cognitive Features That Contribute To Risk:  None    Suicide Risk:  Minimal: No identifiable suicidal ideation.  Patients presenting with no risk factors but with morbid ruminations; may be classified as minimal risk based on the severity of the depressive symptoms  Principal Problem: Bipolar I disorder, most recent episode (or current) manic Discharge Diagnoses:  Patient Active Problem List   Diagnosis Date Noted  . Bipolar I disorder, most recent episode (or current) manic [F31.10] 11/28/2014  . HTN (hypertension) [I10] 11/28/2014  . Dyslipidemia [E78.5] 11/28/2014  . Obesity [E66.9] 11/28/2014  . Diabetes [E11.9] 11/28/2014  . Tobacco use disorder [Z72.0] 11/28/2014    Follow-up Information    Go in 3 days to follow up.   Why:  Hospital Discharge Follow up Contact your worker ADAM directly  [phone]   Contact information:   [facility] Act team [address] [place] [phone] Fax [phone]      Plan Of Care/Follow-up recommendations:  Other:  Follow-up with ACT team  Is patient on multiple antipsychotic therapies at discharge:  No   Has Patient had three or more failed trials of antipsychotic monotherapy by history:  No  Recommended Plan for Multiple Antipsychotic Therapies: NA  Jimmy FootmanHernandez-Gonzalez,  Greg Zabriskie 11/30/2014, 2:08 PM

## 2014-11-30 NOTE — Progress Notes (Signed)
Recreation Therapy Notes  INPATIENT RECREATION TR PLAN  Patient Details Name: Greg [name]. MRN: [id number] DOB: [birth year] Today's Date: 11/30/2014  Rec Therapy Plan Is patient appropriate for Therapeutic Recreation?: Yes Treatment times per week: At least 3 times a week TR Treatment/Interventions: 1:1 session, Group participation (Comment) (Appropriate participation in daily recreation therapy tx)  Discharge Criteria Pt will be discharged from therapy if:: Discharged Treatment plan/goals/alternatives discussed and agreed upon by:: Patient/family  Discharge Summary Short term goals set: Within 5 treatment sessions, patient will verbalize at least 5 positive affirmation statements in each of 4 treatment sessions to increase self-esteem post d/c. (Within 5 treatment sessions, patient will demonstrate at least one stress management technique in each of 2 treatment sessions to increase stress management skills post d/c.) Short term goals met: Not met (Both goals were not met due to patient planning to d/c from hospital before goals could be met) Progress toward goals comments: One-to-one attended Which groups?: Self-esteem, Stress management Reason goals not met: Patient planning to d/c prior to goals being met Therapeutic equipment acquired: N/A Reason patient discharged from therapy: Discharge from hospital Pt/family agrees with progress & goals achieved: Yes Date patient discharged from therapy: 11/30/14   Leonette Monarch, LRT/CTRS 11/30/2014, 10:43 AM

## 2014-11-30 NOTE — Progress Notes (Signed)
Denies depression , suicidal ideation & AV hallucination.Appropriate with staff & peers.AVS explained to patient,verbelized understanding.Priscriptions & personal belongings given to patient.Left unit with staff.

## 2014-11-30 NOTE — Discharge Summary (Signed)
Physician Discharge Summary Note  Patient:  Greg [name]. is an 49 y.o., male MRN:  [id number] DOB:  July 22, 1966 Patient phone:  [phone] (home)  Patient address:   [place] [address],  Total Time spent with patient: 45 minutes  Date of Admission:  11/22/2014 Date of Discharge: 11/30/2014  Reason for Admission:  Mania and psychosis  Principal Problem: Bipolar I disorder, most recent episode (or current) manic Discharge Diagnoses: Patient Active Problem List   Diagnosis Date Noted  . Bipolar I disorder, most recent episode (or current) manic [F31.10] 11/28/2014  . HTN (hypertension) [I10] 11/28/2014  . Dyslipidemia [E78.5] 11/28/2014  . Obesity [E66.9] 11/28/2014  . Diabetes [E11.9] 11/28/2014  . Tobacco use disorder [Z72.0] 11/28/2014    Musculoskeletal: Strength & Muscle Tone: within normal limits Gait & Station: normal Patient leans: N/A  Psychiatric Specialty Exam: Physical Exam  ROS  Blood pressure 135/86, pulse 91, temperature 99 F (37.2 C), temperature source Oral, resp. rate 18, height 5' 10"  (1.778 m), weight 106.595 kg (235 lb).Body mass index is 33.72 kg/(m^2).  General Appearance: Disheveled  Eye Contact::  Good  Speech:  Normal Rate  Volume:  Normal  Mood:  Euthymic  Affect:  Congruent  Thought Process:  Linear and Logical  Orientation:  Full (Time, Place, and Person)  Thought Content:  Negative and Hallucinations: None  Suicidal Thoughts:  No  Homicidal Thoughts:  No  Memory:  Immediate;   Good Recent;   Good Remote;   Good  Judgement:  Fair  Insight:  Fair  Psychomotor Activity:  Normal  Concentration:  Good  Recall:  Good  Fund of Knowledge:Good  Language: Good  Akathisia:  Yes  Handed:  Right  AIMS (if indicated):     Assets:  Housing Social Support  ADL's:  Intact  Cognition: WNL  Sleep:  Number of Hours: 9   Past Medical History:  Past Medical History  Diagnosis Date  . Diabetes   . Obesity    . Dyslipidemia   . HTN (hypertension)    No past surgical history on file. Family History: No family history on file. Social History:  History  Alcohol Use No     History  Drug Use No    History   Social History  . Marital Status: Single    Spouse Name: N/A  . Number of Children: N/A  . Years of Education: N/A   Social History Main Topics  . Smoking status: Current Every Day Smoker  . Smokeless tobacco: Not on file  . Alcohol Use: No  . Drug Use: No  . Sexual Activity: Not on file   Other Topics Concern  . Not on file   Social History Narrative  . No narrative on file     Level of Care:  OP  Hospital Course: The patient is a 49 year old white male with history of bipolar disorder who has become manic and psychotic over the last 2 weeks after being noncompliant with his antipsychotics and mood stabilizers due to complaints of low libido. The patient is currently psychotic and labile, becomes easily agitated and appears unpredictable.   For bipolar disorder, the patient will be continued on Invega but dose was increased to 9 mg.  I will continue the Depakote at 1000 mg twice a day and lithium 300 mg in the morning and 600 mg in the evening.   For akathisia: Continue clonazepam 0.5 mg 3 times a day. Also started on amantadine 100  mg by mouth twice a day. Amantadine can also help with the sexual side effects reported by patient  For hypertension, I will discontinue the lisinopril as this medication can increase risk of lithium toxicity. Instead, I will start him on metoprolol XL  daily as his heart rate was elevated.   For dyslipidemia, he will be continued on simvastatin 10 mg p.o. daily.   For diabetes, he will be continued on metformin 1000 mg p.o. b.i.d.    HPI: IDENTIFYING INFORMATION: The patient is a 49 year old single Caucasian male from [place], [place], who receives disability for a diagnosis of bipolar disorder.   CHIEF COMPLAINT:  "There are multiple variables."   HISTORY OF PRESENT ILLNESS: The patient presented to our Emergency Department on April 25 reporting thoughts of suicidality with the plan of overdosing on his valproic acid medication. "To just peacefully go to the next world." He reported not taking his medication and having bad memories about his childhood. This patient is currently followed up by [facility]. He has a diagnosis of bipolar disorder. He has been in our facility at least 3 times in the past. The patient states that he had been engaged for about a month with somebody that he met month ago. He complained of having low libido with his medications and, therefore, he stopped all of them for about 2 weeks. He states that he probably has been "on a hyperactive state" for about 2 weeks. When asked about symptoms, he denied having problems with sleep. He states that he has been sleeping about 6 to 9 hours. He denied problems with his appetite, energy, or with his concentration. He denies auditory or visual hallucinations, suicidality or homicidality. He denies any problems with his mood and denied having any physical complaints. His only issue was feeling that the medications were making him feel restless. As far as substance abuse, the patient denies abusing any illicit substances or alcohol. He states that he has not used marijuana in about 30 years and he only used it then about 4-5 times. He only drinks rarely. He smokes about 1 pack of cigarettes a day and sometimes he dips tobacco. During assessment, the patient was pleasant and cooperative for about 15-20 minutes; however, towards the end of the assessment, in an unpredictable way, the patient became irate and threw a cup full of water in the trash can splashing all the room and the wall and then stormed out and slamming the door of the office. The patient is also seen in the hallway speaking gibberish and the social worker reported to the staff  today that the patient was seen talking to the phone (he was not holding the auricular, phone was turned off).  PAST PSYCHIATRIC HISTORY: The patient follows up with [facility], has been following up with them for about a year. He has been hospitalized a multitude of times at [facility], [facility], [facility], [facility], [facility], and he has been about 3 times in our facility.  SUICIDAL ATTEMPTS: The patient states that he bought a gun to shoot himself, but did not do it. Denies any actual acts of suicide in the past. His current medication regimen includes Depakote syrup 1000 mg twice a day, Invega 6 mg daily, lithium 300 mg in the morning and 600 mg in the evening, clonazepam 1 mg every 8 hours as needed.   PAST MEDICAL HISTORY: Positive for hypertension, hypercholesterolemia and borderline diabetes.   CURRENT  MEDICATIONS: Are metformin 1000 mg p.o. b.i.d., pravastatin 20 mg p.o. daily, and lisinopril 5 mg p.o. daily.   The patient denies any history of seizures or head trauma.   SOCIAL HISTORY: The patient has been on disability since 1992 and his disability was granted on the basis of severe bipolar disorder. Not much of his social history is known at this time other than he follows up with [facility].    LABORATORY RESULTS: BUN 11, creatinine 0.82, sodium 136, potassium 3.6, calcium 9.2. Alcohol was below detection limit. AST 26, ALT 27, lithium level 0.3, valproic level 42. Urine toxicology was negative. WBC was 10.1, hemoglobin 13.9, hematocrit 42.3, platelet count 275. Urinalysis is clear. Acetaminophen level was less than 10. Salicylate level was less than 4.   Consults:  None  Significant Diagnostic Studies:  None  Discharge Vitals:   Blood pressure 135/86, pulse 91, temperature 99 F (37.2 C), temperature source Oral, resp. rate 18, height 5' 10"  (1.778 m), weight 106.595 kg (235 lb). Body mass index is 33.72 kg/(m^2). Lab  Results:   Results for orders placed or performed during the hospital encounter of 11/22/14 (from the past 72 hour(s))  Lithium level     Status: None   Collection Time: 11/30/14  6:48 AM  Result Value Ref Range   Lithium Lvl 0.79 0.60 - 1.20 mmol/L  Valproic acid level     Status: Abnormal   Collection Time: 11/30/14  6:48 AM  Result Value Ref Range   Valproic Acid Lvl 108 (H) 50.0 - 100.0 ug/mL  Comprehensive metabolic panel     Status: Abnormal   Collection Time: 11/30/14  6:48 AM  Result Value Ref Range   Sodium 138 135 - 145 mmol/L   Potassium 4.2 3.5 - 5.1 mmol/L   Chloride 103 101 - 111 mmol/L   CO2 29 22 - 32 mmol/L   Glucose, Bld 97 65 - 99 mg/dL   BUN 19 6 - 20 mg/dL   Creatinine, Ser 0.80 0.61 - 1.24 mg/dL   Calcium 9.1 8.9 - 10.3 mg/dL   Total Protein 7.1 6.5 - 8.1 g/dL   Albumin 3.7 3.5 - 5.0 g/dL   AST 15 15 - 41 U/L   ALT 14 (L) 17 - 63 U/L   Alkaline Phosphatase 37 (L) 38 - 126 U/L   Total Bilirubin 0.6 0.3 - 1.2 mg/dL   GFR calc non Af Amer >60 >60 mL/min   GFR calc Af Amer >60 >60 mL/min    Comment: (NOTE) The eGFR has been calculated using the CKD EPI equation. This calculation has not been validated in all clinical situations. eGFR's persistently <90 mL/min signify possible Chronic Kidney Disease.    Anion gap 6 5 - 15   Results for MAXI, RODAS (MRN [id number]) as of 11/30/2014 14:06  Ref. Range 11/22/2014 01:04 11/22/2014 23:37 11/24/2014 06:35 11/26/2014 06:40 11/30/2014 06:48  Sodium Latest Ref Range: 135-145 mmol/L 136    138  Potassium Latest Ref Range: 3.5-5.1 mmol/L 3.6    4.2  Chloride Latest Ref Range: 101-111 mmol/L 100 (L)    103  CO2 Latest Ref Range: 22-32 mmol/L 26    29  BUN Latest Ref Range: 6-20 mg/dL 11    19  Creatinine Latest Ref Range: 0.61-1.24 mg/dL 0.82    0.80  Calcium Latest Ref Range: 8.9-10.3 mg/dL 9.2    9.1  EGFR (Non-African Amer.) Latest Ref Range: >60 mL/min >60    >60  EGFR (African American)  Latest Ref Range: >60  mL/min >60    >60  Glucose Latest Ref Range: 65-99 mg/dL 182 (H)    97  Anion gap Latest Ref Range: 5-15  10    6   Alkaline Phosphatase Latest Ref Range: 38-126 U/L 47    37 (L)  Albumin Latest Ref Range: 3.5-5.0 g/dL 3.9    3.7  AST Latest Ref Range: 15-41 U/L 26    15  ALT Latest Ref Range: 17-63 U/L 27    14 (L)  Total Protein Latest Ref Range: 6.5-8.1 g/dL 7.1    7.1  Total Bilirubin Latest Ref Range: 0.3-1.2 mg/dL 0.4    0.6  Cholesterol Latest Units: mg/dL   113    Triglycerides Latest Units: mg/dL   107    HDL Cholesterol Latest Units: mg/dL   37 (L)    Ldl Cholesterol, Calc Latest Units: mg/dL   55    VLDL Cholesterol, Calc Latest Units: mg/dL   21    WBC Latest Ref Range: 3.8-10.6 x10 3/mm 3 10.1      RBC Latest Ref Range: 4.40-5.90 x10 6/mm 3 5.01      HGB Latest Ref Range: 13.0-18.0 g/dL 13.9      HCT Latest Ref Range: 40.0-52.0 % 42.3      MCV Latest Ref Range: 80-100 fL 84      MCH Latest Ref Range: 26.0-34.0 pg 27.7      MCHC Latest Ref Range: 32.0-36.0 g/dL 32.8      RDW Latest Ref Range: 11.5-14.5 % 14.1      Platelets Latest Ref Range: 150-440 x10 3/mm 3 275      Lithium Lvl Latest Ref Range: 0.60-1.20 mmol/L     0.79  Lithium Latest Ref Range: 0.60-1.20 mmol/L    7.89   Salicylates, Serum Latest Units: mg/dL  <4     Valproic Acid Lvl Latest Ref Range: 50.0-100.0 ug/mL     108 (H)  Valproic Acid Latest Ref Range: 50-100 ug/mL    69   Acetaminophen Latest Units: ug/mL  <10     Hemoglobin A1C Latest Units: %   8.4 (H)    Thyroid Stimulating Horm Latest Units: uIU/mL   3.060      See Psychiatric Specialty Exam and Suicide Risk Assessment completed by Attending Physician prior to discharge.  Discharge destination:  Home  Is patient on multiple antipsychotic therapies at discharge:  No   Has Patient had three or more failed trials of antipsychotic monotherapy by history:  No    Recommended Plan for Multiple Antipsychotic Therapies: NA  Discharge Instructions     Diet - low sodium heart healthy    Complete by:  As directed      Diet Carb Modified    Complete by:  As directed             Medication List    STOP taking these medications        lisinopril 5 MG tablet  Commonly known as:  PRINIVIL,ZESTRIL     lithium carbonate 300 MG capsule  Replaced by:  lithium carbonate 300 MG CR tablet     pravastatin 20 MG tablet  Commonly known as:  PRAVACHOL     Valproic Acid 250 MG/5ML Soln      TAKE these medications      Indication   amantadine 100 MG capsule  Commonly known as:  SYMMETREL  Take 1 capsule (100 mg total) by mouth 2 (two) times daily.  Notes to Patient:  Restlessness and sexual side effects      clonazePAM 0.5 MG tablet  Commonly known as:  KLONOPIN  Take 1 tablet (0.5 mg total) by mouth every 8 (eight) hours.      divalproex 500 MG DR tablet  Commonly known as:  DEPAKOTE  Take 2 tablets (1,000 mg total) by mouth 2 (two) times daily.  Notes to Patient:  Mood stabilization      lithium carbonate 300 MG CR tablet  Commonly known as:  LITHOBID  Take 1 tablet (300 mg total) by mouth daily.      lithium carbonate 300 MG CR tablet  Commonly known as:  LITHOBID  Take 2 tablets (600 mg total) by mouth at bedtime.  Notes to Patient:  Mood stabilization      metFORMIN 1000 MG tablet  Commonly known as:  GLUCOPHAGE  Take 1 tablet (1,000 mg total) by mouth 2 (two) times daily with a meal.      metoprolol succinate 50 MG 24 hr tablet  Commonly known as:  TOPROL-XL  Take 1 tablet (50 mg total) by mouth daily. Take with or immediately following a meal.  Notes to Patient:  High Blood pressure      nicotine 14 mg/24hr patch  Commonly known as:  NICODERM CQ - dosed in mg/24 hours  Place 1 patch (14 mg total) onto the skin daily.      paliperidone 156 MG/ML Susp injection  Commonly known as:  INVEGA SUSTENNA  Inject 1 mL (156 mg total) into the muscle once.  Start taking on:  12/08/2014   Indication:  Bipolar disorder      paliperidone 9 MG 24 hr tablet  Commonly known as:  INVEGA  Take 1 tablet (9 mg total) by mouth every morning.  Notes to Patient:  bipolar      simvastatin 10 MG tablet  Commonly known as:  ZOCOR  Take 1 tablet (10 mg total) by mouth at bedtime.  Notes to Patient:  cholesterol            Follow-up Information    Go in 3 days to follow up.   Why:  Hospital Discharge Follow up Contact your worker ADAM directly  [phone]   Contact information:   [facility] Act team [address] [place] [phone] Fax [phone]      Follow-up recommendations:  Other:  Follow-up ACT team  Comments:   Total Discharge Time: Longer than 30 minutes more than 50% of the time was spent in coordination of ACT  team was contacted  Signed: Hildred Priest 11/30/2014, 1:57 PM

## 2015-01-24 DIAGNOSIS — F250 Schizoaffective disorder, bipolar type: Secondary | ICD-10-CM | POA: Diagnosis not present

## 2015-02-06 ENCOUNTER — Emergency Department (HOSPITAL_COMMUNITY)
Admission: EM | Admit: 2015-02-06 | Discharge: 2015-02-07 | Disposition: A | Discharge status: Other Acute Inpatient Hospital | Payer: Medicare Other | Attending: Emergency Medicine | Admitting: Emergency Medicine

## 2015-02-06 ENCOUNTER — Encounter (HOSPITAL_COMMUNITY): Payer: Self-pay | Admitting: *Deleted

## 2015-02-06 DIAGNOSIS — R45851 Suicidal ideations: Secondary | ICD-10-CM

## 2015-02-06 DIAGNOSIS — E669 Obesity, unspecified: Secondary | ICD-10-CM | POA: Diagnosis not present

## 2015-02-06 DIAGNOSIS — I10 Essential (primary) hypertension: Secondary | ICD-10-CM | POA: Diagnosis not present

## 2015-02-06 DIAGNOSIS — F259 Schizoaffective disorder, unspecified: Secondary | ICD-10-CM | POA: Diagnosis not present

## 2015-02-06 DIAGNOSIS — F329 Major depressive disorder, single episode, unspecified: Secondary | ICD-10-CM | POA: Insufficient documentation

## 2015-02-06 DIAGNOSIS — E119 Type 2 diabetes mellitus without complications: Secondary | ICD-10-CM | POA: Diagnosis not present

## 2015-02-06 DIAGNOSIS — F22 Delusional disorders: Secondary | ICD-10-CM | POA: Insufficient documentation

## 2015-02-06 DIAGNOSIS — F3110 Bipolar disorder, current episode manic without psychotic features, unspecified: Secondary | ICD-10-CM | POA: Diagnosis not present

## 2015-02-06 DIAGNOSIS — F32A Depression, unspecified: Secondary | ICD-10-CM

## 2015-02-06 DIAGNOSIS — E785 Hyperlipidemia, unspecified: Secondary | ICD-10-CM | POA: Insufficient documentation

## 2015-02-06 DIAGNOSIS — Z720 Tobacco use: Secondary | ICD-10-CM | POA: Diagnosis not present

## 2015-02-06 DIAGNOSIS — Z79899 Other long term (current) drug therapy: Secondary | ICD-10-CM | POA: Insufficient documentation

## 2015-02-06 HISTORY — DX: Schizoaffective disorder, bipolar type: F25.0

## 2015-02-06 HISTORY — DX: Delusional disorders: F22

## 2015-02-06 HISTORY — DX: Schizoaffective disorder, unspecified: F25.9

## 2015-02-06 LAB — CBC
HCT: 44.1 % (ref 39.0–52.0)
Hemoglobin: 14.5 g/dL (ref 13.0–17.0)
MCH: 28.3 pg (ref 26.0–34.0)
MCHC: 32.9 g/dL (ref 30.0–36.0)
MCV: 86 fL (ref 78.0–100.0)
Platelets: 247 10*3/uL (ref 150–400)
RBC: 5.13 MIL/uL (ref 4.22–5.81)
RDW: 13.5 % (ref 11.5–15.5)
WBC: 11.5 10*3/uL — ABNORMAL HIGH (ref 4.0–10.5)

## 2015-02-06 LAB — SALICYLATE LEVEL: Salicylate Lvl: <4 mg/dL (ref 2.8–30.0)

## 2015-02-06 LAB — RAPID URINE DRUG SCREEN, HOSP PERFORMED
Amphetamines: NOT DETECTED
Barbiturates: NOT DETECTED
Benzodiazepines: NOT DETECTED
Cocaine: NOT DETECTED
Opiates: NOT DETECTED
Tetrahydrocannabinol: NOT DETECTED

## 2015-02-06 LAB — COMPREHENSIVE METABOLIC PANEL
ALT: 16 U/L — ABNORMAL LOW (ref 17–63)
AST: 20 U/L (ref 15–41)
Albumin: 4.4 g/dL (ref 3.5–5.0)
Alkaline Phosphatase: 46 U/L (ref 38–126)
Anion gap: 9 (ref 5–15)
BUN: 17 mg/dL (ref 6–20)
CO2: 24 mmol/L (ref 22–32)
Calcium: 9.1 mg/dL (ref 8.9–10.3)
Chloride: 102 mmol/L (ref 101–111)
Creatinine, Ser: 0.9 mg/dL (ref 0.61–1.24)
GFR calc Af Amer: >60 mL/min (ref >60)
GFR calc non Af Amer: >60 mL/min (ref >60)
Glucose, Bld: 122 mg/dL — ABNORMAL HIGH (ref 65–99)
Potassium: 4 mmol/L (ref 3.5–5.1)
Sodium: 135 mmol/L (ref 135–145)
Total Bilirubin: 0.6 mg/dL (ref 0.3–1.2)
Total Protein: 8.2 g/dL — ABNORMAL HIGH (ref 6.5–8.1)

## 2015-02-06 LAB — ACETAMINOPHEN LEVEL: Acetaminophen (Tylenol), Serum: <10 ug/mL — ABNORMAL LOW (ref 10–30)

## 2015-02-06 LAB — ETHANOL: Alcohol, Ethyl (B): <5 mg/dL (ref <5)

## 2015-02-06 MED ORDER — ZOLPIDEM TARTRATE 5 MG PO TABS
5.0000 mg | ORAL_TABLET | Freq: Every evening | ORAL | Status: DC | PRN
  Administered 2015-02-06: 5 mg via ORAL
  Filled 2015-02-06: qty 1

## 2015-02-06 MED ORDER — ALUM & MAG HYDROXIDE-SIMETH 200-200-20 MG/5ML PO SUSP
30.0000 mL | ORAL | Status: DC | PRN
Start: 2015-02-06 — End: 2015-02-07

## 2015-02-06 MED ORDER — METOPROLOL SUCCINATE ER 50 MG PO TB24
50.0000 mg | ORAL_TABLET | Freq: Every day | ORAL | Status: DC
  Administered 2015-02-07: 50 mg via ORAL
  Filled 2015-02-06: qty 1

## 2015-02-06 MED ORDER — ACETAMINOPHEN 325 MG PO TABS: 650.0000 mg | ORAL_TABLET | Freq: Three times a day (TID) | ORAL | Status: DC | PRN

## 2015-02-06 MED ORDER — IBUPROFEN 200 MG PO TABS: 600.0000 mg | ORAL_TABLET | Freq: Three times a day (TID) | ORAL | Status: DC | PRN

## 2015-02-06 MED ORDER — NICOTINE 14 MG/24HR TD PT24
14.0000 mg | MEDICATED_PATCH | Freq: Every day | TRANSDERMAL | Status: DC
  Filled 2015-02-06: qty 1

## 2015-02-06 MED ORDER — METFORMIN HCL 500 MG PO TABS
1000.0000 mg | ORAL_TABLET | Freq: Two times a day (BID) | ORAL | Status: DC
  Administered 2015-02-07 (×2): 1000 mg via ORAL
  Filled 2015-02-06 (×3): qty 2

## 2015-02-06 MED ORDER — SIMVASTATIN 10 MG PO TABS
10.0000 mg | ORAL_TABLET | Freq: Every day | ORAL | Status: DC
  Administered 2015-02-06: 10 mg via ORAL
  Filled 2015-02-06 (×2): qty 1

## 2015-02-06 NOTE — BH Assessment (Signed)
Assessment completed. Consulted Hulan FessIjeoma Nwaeze, NP who recommended inpatient treatment. Dr. Micheline Mazeocherty has been informed of his recommendation.

## 2015-02-06 NOTE — ED Provider Notes (Signed)
CSN: [id number]     Arrival date & time 02/06/15  1945 History   First MD Initiated Contact with Patient 02/06/15 2134     Chief Complaint  Patient presents with  . Suicidal  . Paranoid     (Consider location/radiation/quality/duration/timing/severity/associated sxs/prior Treatment) Patient is a 49 y.o. male presenting with mental health disorder. The history is provided by the patient. No language interpreter was used.  Mental Health Problem Presenting symptoms: depression, paranoid behavior and suicidal thoughts   Presenting symptoms: no agitation   Patient accompanied by:  Family member Degree of incapacity (severity):  Severe Onset quality:  Gradual Timing:  Constant Progression:  Worsening Chronicity:  Recurrent Context: noncompliance   Treatment compliance:  Untreated Relieved by:  Nothing Worsened by:  Nothing tried Ineffective treatments:  None tried Associated symptoms: feelings of worthlessness and poor judgment   Associated symptoms: no abdominal pain, no appetite change, no chest pain, no fatigue and no headaches   Risk factors: hx of mental illness   Risk factors: no hx of suicide attempts and no recent psychiatric admission     Past Medical History  Diagnosis Date  . Diabetes   . Obesity   . Dyslipidemia   . HTN (hypertension)   . Schizo affective schizophrenia   . Paranoia    History reviewed. No pertinent past surgical history. No family history on file. History  Substance Use Topics  . Smoking status: Current Every Day Smoker -- 1.00 packs/day    Types: Cigarettes  . Smokeless tobacco: Current User    Types: Snuff  . Alcohol Use: No    Review of Systems  Constitutional: Negative for fever, activity change, appetite change and fatigue.  HENT: Negative for congestion, facial swelling, rhinorrhea and trouble swallowing.   Eyes: Negative for photophobia and pain.  Respiratory: Negative for cough, chest tightness and shortness of breath.    Cardiovascular: Negative for chest pain and leg swelling.  Gastrointestinal: Negative for nausea, vomiting, abdominal pain, diarrhea and constipation.  Endocrine: Negative for polydipsia and polyuria.  Genitourinary: Negative for dysuria, urgency, decreased urine volume and difficulty urinating.  Musculoskeletal: Negative for back pain and gait problem.  Skin: Negative for color change, rash and wound.  Allergic/Immunologic: Negative for immunocompromised state.  Neurological: Negative for dizziness, facial asymmetry, speech difficulty, weakness, numbness and headaches.  Psychiatric/Behavioral: Positive for suicidal ideas and paranoia. Negative for confusion, decreased concentration and agitation.      Allergies  Haldol; Risperdal; and Seroquel  Home Medications   Prior to Admission medications   Medication Sig Start Date End Date Taking? Authorizing Provider  clonazePAM (KLONOPIN) 0.5 MG tablet Take 1 tablet (0.5 mg total) by mouth every 8 (eight) hours. Patient taking differently: Take 0.5 mg by mouth every 8 (eight) hours as needed for anxiety.  11/29/14  Yes Jimmy Footman, MD  divalproex (DEPAKOTE) 500 MG DR tablet Take 2 tablets (1,000 mg total) by mouth 2 (two) times daily. Patient taking differently: Take 500 mg by mouth 2 (two) times daily.  11/29/14  Yes Jimmy Footman, MD  lithium carbonate (ESKALITH) 450 MG CR tablet Take 450-900 mg by mouth 2 (two) times daily. Take 2 tablets every morning and 1 tablet every night 01/18/15  Yes Historical Provider, MD  metFORMIN (GLUCOPHAGE) 1000 MG tablet Take 1 tablet (1,000 mg total) by mouth 2 (two) times daily with a meal. 11/29/14  Yes Jimmy Footman, MD  metoprolol succinate (TOPROL-XL) 50 MG 24 hr tablet Take 1 tablet (50 mg total) by  mouth daily. Take with or immediately following a meal. 11/29/14  Yes Jimmy Footman, MD  simvastatin (ZOCOR) 10 MG tablet Take 1 tablet (10 mg total) by mouth at  bedtime. 11/29/14  Yes Jimmy Footman, MD  amantadine (SYMMETREL) 100 MG capsule Take 1 capsule (100 mg total) by mouth 2 (two) times daily. Patient not taking: Reported on 02/06/2015 11/29/14   Jimmy Footman, MD  lithium carbonate (LITHOBID) 300 MG CR tablet Take 1 tablet (300 mg total) by mouth daily. Patient not taking: Reported on 02/06/2015 11/29/14   Jimmy Footman, MD  lithium carbonate (LITHOBID) 300 MG CR tablet Take 2 tablets (600 mg total) by mouth at bedtime. Patient not taking: Reported on 02/06/2015 11/29/14   Jimmy Footman, MD  nicotine (NICODERM CQ - DOSED IN MG/24 HOURS) 14 mg/24hr patch Place 1 patch (14 mg total) onto the skin daily. Patient not taking: Reported on 02/06/2015 11/30/14   Jimmy Footman, MD  paliperidone (INVEGA SUSTENNA) 156 MG/ML SUSP injection Inject 1 mL (156 mg total) into the muscle once. Patient not taking: Reported on 02/06/2015 12/08/14   Jimmy Footman, MD  paliperidone (INVEGA) 9 MG 24 hr tablet Take 1 tablet (9 mg total) by mouth every morning. Patient not taking: Reported on 02/06/2015 11/29/14   Jimmy Footman, MD   BP 156/100 mmHg  Pulse 102  Temp(Src) 98.1 F (36.7 C) (Rectal)  Resp 20  Wt 9 lb 11.2 oz (4.4 kg)  SpO2 100% Physical Exam  Constitutional: He is oriented to person, place, and time. He appears well-developed and well-nourished. No distress.  HENT:  Head: Normocephalic and atraumatic.  Mouth/Throat: No oropharyngeal exudate.  Eyes: Pupils are equal, round, and reactive to light.  Neck: Normal range of motion. Neck supple.  Cardiovascular: Normal rate, regular rhythm and normal heart sounds.  Exam reveals no gallop and no friction rub.   No murmur heard. Pulmonary/Chest: Effort normal and breath sounds normal. No respiratory distress. He has no wheezes. He has no rales.  Abdominal: Soft. Bowel sounds are normal. He exhibits no distension and no mass. There is no  tenderness. There is no rebound and no guarding.  Musculoskeletal: Normal range of motion. He exhibits no edema or tenderness.  Neurological: He is alert and oriented to person, place, and time.  Skin: Skin is warm and dry.  Psychiatric: Thought content is paranoid. He exhibits a depressed mood. He expresses suicidal ideation. He expresses suicidal plans.    ED Course  Procedures (including critical care time) Labs Review Labs Reviewed  ACETAMINOPHEN LEVEL - Abnormal; Notable for the following:    Acetaminophen (Tylenol), Serum <10 (*)    All other components within normal limits  CBC - Abnormal; Notable for the following:    WBC 11.5 (*)    All other components within normal limits  COMPREHENSIVE METABOLIC PANEL - Abnormal; Notable for the following:    Glucose, Bld 122 (*)    Total Protein 8.2 (*)    ALT 16 (*)    All other components within normal limits  ETHANOL  SALICYLATE LEVEL  URINE RAPID DRUG SCREEN, HOSP PERFORMED    Imaging Review No results found.   EKG Interpretation None      MDM   Final diagnoses:  Paranoia  Depression with suicidal ideation   Pt is a 49 y.o. male with Pmhx as above who presents with  Worsening depression, paranoia, auditory hallucinations and suicidal ideation with plan to overdose on sleeping pills.  Patient states that he does not  feel safe and needs to be in a more secure environment.  Patient's mother states that over the past several days he has been feeling so paranoid that he was unable to stay in his apartment and has been staying in a hotel room.  He has not been taking his antipsychotics as he is having trouble dealing with side effects including decreased libido.  Patient is medically clear.  He has been IVC by myself and I feel is appropriate for inpatient treatment.   Patient's ACT team leader is Porfirio MylarCarmen at act team.  His primary psychiatrist is Dr. Otis DialsSmoot.  Patient's mother's phone numbers [phone].    Toy CookeyMegan Docherty,  MD 02/06/15 2234

## 2015-02-06 NOTE — BH Assessment (Addendum)
Tele Assessment Note   Greg Raz. is an 49 y.o. male presenting to [facility] reporting suicidal ideations with a plan to overdose with sleeping pills. Pt also reported that he is paranoid. Pt stated "I am paranoid and I have suicidal thoughts". "I started hearing voices and hearing someone knocking on the door at 5:30 in the morning". "I have been off of my medication for 2 days". "My doctor doesn't think that I am schizoaffective he thinks that its more so bipolar". Pt reported that he has been having suicidal thoughts for the past 3 days. PT reported that he has attempted suicide once in the past. Pt shared that approximately 18 years ago he brought a rifle; but his father ended up returning it to the store. Pt did not report any self-injurious behaviors or a family history of suicide. Pt shared that he is followed by the [facility]. Pt reported that he has been hospitalized multiple times in the past.  Pt is endorsing multiple depressive symptoms and shared that he is dealing with multiple stressors. Pt reported that he is dealing with financial stressors due to maxing out his credit cards during a manic episode. He also reported that he parents brought him a car but he got a different car. Pt reported that his credit is bad and he feels as if the creditors are trying to kill him. Pt denies HI  at this time and did not report any aggressive or violent behaviors. Pt is endorsing auditory hallucinations and shared that the voices are unfamiliar. Pt stated "they are saying f u, mf'er and kill". Pt did not report any alcohol or illicit substance abuse but shared that he drinks socially. When pt was asked about his ability to contract for safety pt stated "I don't know if I can answer that question I think I just need to bite the bullet and stay". "I am calm and confused at the same time". "I am extremely paranoid".  Inpatient treatment is recommended for psychiatric stabilization.   Axis I:  Schizoaffective Disorder  Past Medical History:  Past Medical History  Diagnosis Date  . Diabetes   . Obesity   . Dyslipidemia   . HTN (hypertension)   . Schizo affective schizophrenia   . Paranoia     History reviewed. No pertinent past surgical history.  Family History: No family history on file.  Social History:  reports that he has been smoking Cigarettes.  He has been smoking about 1.00 pack per day. His smokeless tobacco use includes Snuff. He reports that he does not drink alcohol or use illicit drugs.  Additional Social History:  Alcohol / Drug Use History of alcohol / drug use?: Yes Substance #1 Name of Substance 1: Alcohol  1 - Age of First Use: 18 1 - Amount (size/oz): 2-12oz 1 - Frequency: once  1 - Duration: once  1 - Last Use / Amount: 02-06-15  CIWA: CIWA-Ar BP: 156/100 mmHg Pulse Rate: 102 COWS:    PATIENT STRENGTHS: (choose at least two) Average or above average intelligence Communication skills  Allergies:  Allergies  Allergen Reactions  . Haldol [Haloperidol] Anaphylaxis  . Risperdal [Risperidone] Other (See Comments)    Reaction: unknown   . Seroquel [Quetiapine Fumarate] Other (See Comments)    Reaction: unknown     Home Medications:  (Not in a hospital admission)  OB/GYN Status:  No LMP for male patient.  General Assessment Data Location of Assessment: [facility] T[facility] Assessment: In system  Is this a Tele or Face-to-Face Assessment?: Face-to-Face Is this an Initial Assessment or a Re-assessment for this encounter?: Initial Assessment Marital status: Single Living Arrangements: Alone Can pt return to current living arrangement?: Yes Admission Status: Voluntary Is patient capable of signing voluntary admission?: Yes Referral Source: Self/Family/Friend Insurance type: Medicare     Crisis Care Plan Living Arrangements: Alone Name of Psychiatrist: Dr. Otis Dials  Name of Therapist: Dr. Aida Puffer   Education Status Is patient currently in  school?: No Current Grade: NA Highest grade of school patient has completed: NA Name of school: NA Contact person: NA  Risk to self with the past 6 months Suicidal Ideation: Yes-Currently Present Has patient been a risk to self within the past 6 months prior to admission? : Yes Suicidal Intent: Yes-Currently Present Has patient had any suicidal intent within the past 6 months prior to admission? : Yes Is patient at risk for suicide?: Yes Suicidal Plan?: Yes-Currently Present Has patient had any suicidal plan within the past 6 months prior to admission? : Yes Specify Current Suicidal Plan: Pt reported that he would take sleeping pills.  Access to Means: Yes Specify Access to Suicidal Means: Pt has access to prescription medication. What has been your use of drugs/alcohol within the last 12 months?: Pt reported that he had a 2-12oz beers today. Pt reported that he is a social drinker.  Previous Attempts/Gestures: Yes How many times?: 1 Other Self Harm Risks: No other self harm risk identified at this time.  Triggers for Past Attempts: Unpredictable Intentional Self Injurious Behavior: None Family Suicide History: No Recent stressful life event([name]): Financial Problems Persecutory voices/beliefs?: Yes Depression: Yes Depression Symptoms: Isolating, Tearfulness, Fatigue, Guilt, Loss of interest in usual pleasures, Feeling angry/irritable Substance abuse history and/or treatment for substance abuse?: Yes Suicide prevention information given to non-admitted patients: Not applicable  Risk to Others within the past 6 months Homicidal Ideation: No Does patient have any lifetime risk of violence toward others beyond the six months prior to admission? : No Thoughts of Harm to Others: No Current Homicidal Intent: No Current Homicidal Plan: No Access to Homicidal Means: No Identified Victim: NA History of harm to others?: No Assessment of Violence: On admission Violent Behavior Description:  No violent behaviors observed. Pt is calm and cooperative at this time.  Does patient have access to weapons?: No Criminal Charges Pending?: No Does patient have a court date: No Is patient on probation?: No  Psychosis Hallucinations: Auditory Delusions: None noted  Mental Status Report Appearance/Hygiene: In scrubs Eye Contact: Good Motor Activity: Freedom of movement Speech: Logical/coherent Level of Consciousness: Alert Mood: Pleasant Affect: Appropriate to circumstance Anxiety Level: Minimal Thought Processes: Coherent, Relevant Judgement: Unimpaired Orientation: Appropriate for developmental age Obsessive Compulsive Thoughts/Behaviors: Minimal  Cognitive Functioning Concentration: Decreased Memory: Recent Intact, Remote Intact IQ: Average Insight: Fair Impulse Control: Good Appetite: Good Weight Loss: 0 Weight Gain: 0 Sleep: No Change Total Hours of Sleep: 8 Vegetative Symptoms: None  ADLScreening [facility] Assessment Services) Patient'[name] cognitive ability adequate to safely complete daily activities?: Yes Patient able to express need for assistance with ADLs?: Yes Independently performs ADLs?: Yes (appropriate for developmental age)  Prior Inpatient Therapy Prior Inpatient Therapy: Yes Prior Therapy Dates: 1190-2016 Prior Therapy Facilty/Provider([name]): [facility], [facility], [facility], [facility], [facility]  Reason for Treatment: Schizoaffective, bipolar   Prior Outpatient Therapy Prior Outpatient Therapy: Yes Prior Therapy Dates: 2009-present  Prior Therapy Facilty/Provider([name]): Dr. Smoot/Dr. Aida Puffer  Reason for Treatment: Schizoaffective, bipolar  Does patient have an ACCT team?:  Yes Does patient have Intensive In-House Services?  : No Does patient have [facility] services? : Yes Does patient have P4CC services?: No  ADL Screening (condition at time of admission) Patient'[name] cognitive ability adequate to safely complete daily activities?: Yes Is the patient deaf or have difficulty  hearing?: No Does the patient have difficulty seeing, even when wearing glasses/contacts?: No Does the patient have difficulty concentrating, remembering, or making decisions?: No Patient able to express need for assistance with ADLs?: Yes Does the patient have difficulty dressing or bathing?: No Independently performs ADLs?: Yes (appropriate for developmental age) Does the patient have difficulty walking or climbing stairs?: No       Abuse/Neglect Assessment (Assessment to be complete while patient is alone) Physical Abuse: Denies Verbal Abuse: Denies Sexual Abuse: Denies Exploitation of patient/patient'[name] resources: Denies Self-Neglect: Denies     Merchant navy officerAdvance Directives (For Healthcare) Does patient have an advance directive?: Yes Type of Advance Directive: MidwifeHealthcare Power of Attorney    Additional Information 1:1 In Past 12 Months?: No CIRT Risk: No Elopement Risk: No Does patient have medical clearance?: Yes     Disposition:  Disposition Initial Assessment Completed for this Encounter: Yes Disposition of Patient: Inpatient treatment program Type of inpatient treatment program: Adult  Greg [name] 02/06/2015 11:16 PM

## 2015-02-06 NOTE — ED Notes (Signed)
Pt states that he is tried of being sick and wants to die; pt states that he had a plan to overdose on medication; pt states that he is wanting to get in to a more secure environment like a group home; pt states that is hearing voices yelling profanity in his head; pt also states that he hears people knocking and banging on his door and he is afraid; pt states that he is tired of living like this and just wants it all to end

## 2015-02-06 NOTE — ED Notes (Signed)
Pt. To [facility] from ED ambulatory without difficulty, to room 34 . [facility] Pt. Is alert and oriented, warm and dry in no distress. Pt. Denies SI, HI, and AVH. Pt. Calm and cooperative. Pt. Made aware of security cameras and Q15 minute rounds. Pt. Encouraged to let Nursing staff know of any concerns or needs.

## 2015-02-07 ENCOUNTER — Encounter (HOSPITAL_COMMUNITY): Payer: Self-pay | Admitting: *Deleted

## 2015-02-07 ENCOUNTER — Inpatient Hospital Stay (HOSPITAL_COMMUNITY)
Admission: AD | Admit: 2015-02-07 | Discharge: 2015-02-11 | DRG: 885 | Disposition: A | Discharge status: Home / Self Care | Payer: Medicare Other | Source: Intra-hospital | Attending: Psychiatry | Admitting: Psychiatry

## 2015-02-07 DIAGNOSIS — E119 Type 2 diabetes mellitus without complications: Secondary | ICD-10-CM | POA: Diagnosis present

## 2015-02-07 DIAGNOSIS — I10 Essential (primary) hypertension: Secondary | ICD-10-CM | POA: Diagnosis present

## 2015-02-07 DIAGNOSIS — F329 Major depressive disorder, single episode, unspecified: Secondary | ICD-10-CM | POA: Diagnosis not present

## 2015-02-07 DIAGNOSIS — F3164 Bipolar disorder, current episode mixed, severe, with psychotic features: Secondary | ICD-10-CM | POA: Diagnosis not present

## 2015-02-07 DIAGNOSIS — Z818 Family history of other mental and behavioral disorders: Secondary | ICD-10-CM | POA: Diagnosis not present

## 2015-02-07 DIAGNOSIS — R45851 Suicidal ideations: Secondary | ICD-10-CM

## 2015-02-07 DIAGNOSIS — Z9119 Patient's noncompliance with other medical treatment and regimen: Secondary | ICD-10-CM | POA: Diagnosis present

## 2015-02-07 DIAGNOSIS — F312 Bipolar disorder, current episode manic severe with psychotic features: Secondary | ICD-10-CM | POA: Diagnosis present

## 2015-02-07 DIAGNOSIS — F17210 Nicotine dependence, cigarettes, uncomplicated: Secondary | ICD-10-CM | POA: Diagnosis present

## 2015-02-07 DIAGNOSIS — E785 Hyperlipidemia, unspecified: Secondary | ICD-10-CM | POA: Diagnosis present

## 2015-02-07 DIAGNOSIS — G4700 Insomnia, unspecified: Secondary | ICD-10-CM | POA: Diagnosis present

## 2015-02-07 DIAGNOSIS — E669 Obesity, unspecified: Secondary | ICD-10-CM | POA: Diagnosis not present

## 2015-02-07 DIAGNOSIS — F3110 Bipolar disorder, current episode manic without psychotic features, unspecified: Secondary | ICD-10-CM

## 2015-02-07 DIAGNOSIS — F22 Delusional disorders: Secondary | ICD-10-CM | POA: Diagnosis not present

## 2015-02-07 DIAGNOSIS — F419 Anxiety disorder, unspecified: Secondary | ICD-10-CM | POA: Diagnosis present

## 2015-02-07 MED ORDER — MAGNESIUM HYDROXIDE 400 MG/5ML PO SUSP: 30.0000 mL | Freq: Every day | ORAL | Status: DC | PRN

## 2015-02-07 MED ORDER — NICOTINE POLACRILEX 2 MG MT GUM
2.0000 mg | CHEWING_GUM | OROMUCOSAL | Status: DC | PRN
  Administered 2015-02-08 – 2015-02-11 (×4): 2 mg via ORAL
  Filled 2015-02-07: qty 1

## 2015-02-07 MED ORDER — ALUM & MAG HYDROXIDE-SIMETH 200-200-20 MG/5ML PO SUSP: 30.0000 mL | ORAL | Status: DC | PRN

## 2015-02-07 MED ORDER — HYDROXYZINE HCL 25 MG PO TABS
25.0000 mg | ORAL_TABLET | Freq: Four times a day (QID) | ORAL | Status: DC | PRN
  Administered 2015-02-07 – 2015-02-09 (×2): 25 mg via ORAL
  Filled 2015-02-07 (×2): qty 1

## 2015-02-07 MED ORDER — TRAZODONE HCL 50 MG PO TABS
50.0000 mg | ORAL_TABLET | Freq: Every evening | ORAL | Status: DC | PRN
  Administered 2015-02-07: 50 mg via ORAL
  Filled 2015-02-07 (×2): qty 1
  Filled 2015-02-07 (×2): qty 6
  Filled 2015-02-07 (×4): qty 1
  Filled 2015-02-07: qty 6
  Filled 2015-02-07 (×4): qty 1
  Filled 2015-02-07: qty 6
  Filled 2015-02-07: qty 1

## 2015-02-07 MED ORDER — ACETAMINOPHEN 325 MG PO TABS: 650.0000 mg | ORAL_TABLET | Freq: Four times a day (QID) | ORAL | Status: DC | PRN

## 2015-02-07 NOTE — ED Notes (Signed)
Pt. Noted sleeping in room. No complaints or concerns voiced. No distress or abnormal behavior noted. Will continue to monitor with security cameras. Q 15 minute rounds continue. 

## 2015-02-07 NOTE — ED Provider Notes (Signed)
Plan to transfer to [facility].  Last set of vitals are normal.  Labs reviewed.  Stable for transfer  Linwood DibblesJon Tera Pellicane, MD 02/07/15 [phone]

## 2015-02-07 NOTE — Progress Notes (Signed)
Pt is a new admission this evening reports feeling paranoid and having AH. Pt reports he has been on his medication has has not been effective. Pt reports AH telling him "fuck you MF". Pt reports having an ACT team but would like to go to an assisted living facility to manage his medication.

## 2015-02-07 NOTE — Tx Team (Signed)
Initial Interdisciplinary Treatment Plan   PATIENT STRESSORS: Medication change or noncompliance   PATIENT STRENGTHS: Ability for insight Average or above average intelligence Capable of independent living General fund of knowledge Motivation for treatment/growth Supportive family/friends   PROBLEM LIST: Problem List/Patient Goals Date to be addressed Date deferred Reason deferred Estimated date of resolution  Depression 02/07/15     Suicidal Ideation 02/07/15     "I want to get my meds straight" 02/07/15     "I been feeling paranoid" 02/07/15                                    DISCHARGE CRITERIA:  Ability to meet basic life and health needs Improved stabilization in mood, thinking, and/or behavior Verbal commitment to aftercare and medication compliance  PRELIMINARY DISCHARGE PLAN: Attend aftercare/continuing care group  PATIENT/FAMIILY INVOLVEMENT: This treatment plan has been presented to and reviewed with the patient, Greg CoxRichard F Aspinwall Jr., and/or family member, .  The patient and family have been given the opportunity to ask questions and make suggestions.  Greg [name], Greg [name] 02/07/2015, 7:48 PM

## 2015-02-07 NOTE — Consult Note (Signed)
[facility] Consult   Reason for Consult:  Suicidal ideation Referring Physician:  EDP Patient Identification: Greg [name]. MRN:  [id number] Principal Diagnosis: Bipolar I disorder, most recent episode (or current) manic Diagnosis:   Patient Active Problem List   Diagnosis Date Noted  . Suicidal ideation [R45.851] 02/07/2015    Priority: High  . Bipolar I disorder, most recent episode (or current) manic [F31.10] 11/28/2014    Priority: High  . Paranoia [F22]   . HTN (hypertension) [I10] 11/28/2014  . Dyslipidemia [E78.5] 11/28/2014  . Obesity [E66.9] 11/28/2014  . Diabetes [E11.9] 11/28/2014  . Tobacco use disorder [Z72.0] 11/28/2014    Total Time spent with patient: 25 minutes  Subjective:   Greg Peggs. is a 49 y.o. male patient admitted with reports of suicidal ideation and paranoid ideation with a history of being diagnosed with Bipolar. Pt reports paranoid thoughts as below and has a persistent plan to overdose. Pt warrants inpatient admission at this time.  HPI:  I have reviewed and concur with ED HPI and have modified as follows: Greg Villada. is an 49 y.o. male presenting to [facility] reporting suicidal ideations with a plan to overdose with sleeping pills. Pt also reported that he is paranoid. Pt stated "I am paranoid and I have suicidal thoughts". "I started hearing voices and hearing someone knocking on the door at 5:30 in the morning". "I have been off of my medication for 2 days". "My doctor doesn't think that I am schizoaffective he thinks that its more so bipolar". Pt reported that he has been having suicidal thoughts for the past 3 days. PT reported that he has attempted suicide once in the past. Pt shared that approximately 18 years ago he brought a rifle; but his father ended up returning it to the store. Pt did not report any self-injurious behaviors or a family history of suicide. Pt shared that he is followed by the [facility]  Team. Pt reported that he has been hospitalized multiple times in the past. Pt is endorsing multiple depressive symptoms and shared that he is dealing with multiple stressors. Pt reported that he is dealing with financial stressors due to maxing out his credit cards during a manic episode. He also reported that he parents brought him a car but he got a different car. Pt reported that his credit is bad and he feels as if the creditors are trying to kill him. Pt denies HI at this time and did not report any aggressive or violent behaviors. Pt is endorsing auditory hallucinations and shared that the voices are unfamiliar. Pt stated "they are saying f u, mf'er and kill". Pt did not report any alcohol or illicit substance abuse but shared that he drinks socially. When pt was asked about his ability to contract for safety pt stated "I don't know if I can answer that question I think I just need to bite the bullet and stay". "I am calm and confused at the same time". "I am extremely paranoid".  Inpatient treatment is recommended for psychiatric stabilization.    Past Medical History:  Past Medical History  Diagnosis Date  . Diabetes   . Obesity   . Dyslipidemia   . HTN (hypertension)   . Schizo affective schizophrenia   . Paranoia    History reviewed. No pertinent past surgical history. Family History: No family history on file. Social History:  History  Alcohol Use No     History  Drug Use  No    History   Social History  . Marital Status: Single    Spouse Name: N/A  . Number of Children: N/A  . Years of Education: N/A   Social History Main Topics  . Smoking status: Current Every Day Smoker -- 1.00 packs/day    Types: Cigarettes  . Smokeless tobacco: Current User    Types: Snuff  . Alcohol Use: No  . Drug Use: No  . Sexual Activity: Not on file   Other Topics Concern  . None   Social History Narrative   Additional Social History:    History of alcohol / drug use?: Yes Name  of Substance 1: Alcohol  1 - Age of First Use: 18 1 - Amount (size/oz): 2-12oz 1 - Frequency: once  1 - Duration: once  1 - Last Use / Amount: 02-06-15                   Allergies:   Allergies  Allergen Reactions  . Haldol [Haloperidol] Anaphylaxis  . Risperdal [Risperidone] Other (See Comments)    Reaction: unknown   . Seroquel [Quetiapine Fumarate] Other (See Comments)    Reaction: unknown     Labs:  Results for orders placed or performed during the hospital encounter of 02/06/15 (from the past 48 hour(s))  Acetaminophen level     Status: Abnormal   Collection Time: 02/06/15  8:56 PM  Result Value Ref Range   Acetaminophen (Tylenol), Serum <10 (L) 10 - 30 ug/mL    Comment:        THERAPEUTIC CONCENTRATIONS VARY SIGNIFICANTLY. A RANGE OF 10-30 ug/mL MAY BE AN EFFECTIVE CONCENTRATION FOR MANY PATIENTS. HOWEVER, SOME ARE BEST TREATED AT CONCENTRATIONS OUTSIDE THIS RANGE. ACETAMINOPHEN CONCENTRATIONS >150 ug/mL AT 4 HOURS AFTER INGESTION AND >50 ug/mL AT 12 HOURS AFTER INGESTION ARE OFTEN ASSOCIATED WITH TOXIC REACTIONS.   CBC     Status: Abnormal   Collection Time: 02/06/15  8:56 PM  Result Value Ref Range   WBC 11.5 (H) 4.0 - 10.5 K/uL   RBC 5.13 4.22 - 5.81 MIL/uL   Hemoglobin 14.5 13.0 - 17.0 g/dL   HCT 44.1 39.0 - 52.0 %   MCV 86.0 78.0 - 100.0 fL   MCH 28.3 26.0 - 34.0 pg   MCHC 32.9 30.0 - 36.0 g/dL   RDW 13.5 11.5 - 15.5 %   Platelets 247 150 - 400 K/uL  Comprehensive metabolic panel     Status: Abnormal   Collection Time: 02/06/15  8:56 PM  Result Value Ref Range   Sodium 135 135 - 145 mmol/L   Potassium 4.0 3.5 - 5.1 mmol/L   Chloride 102 101 - 111 mmol/L   CO2 24 22 - 32 mmol/L   Glucose, Bld 122 (H) 65 - 99 mg/dL   BUN 17 6 - 20 mg/dL   Creatinine, Ser 0.90 0.61 - 1.24 mg/dL   Calcium 9.1 8.9 - 10.3 mg/dL   Total Protein 8.2 (H) 6.5 - 8.1 g/dL   Albumin 4.4 3.5 - 5.0 g/dL   AST 20 15 - 41 U/L   ALT 16 (L) 17 - 63 U/L   Alkaline  Phosphatase 46 38 - 126 U/L   Total Bilirubin 0.6 0.3 - 1.2 mg/dL   GFR calc non Af Amer >60 >60 mL/min   GFR calc Af Amer >60 >60 mL/min    Comment: (NOTE) The eGFR has been calculated using the CKD EPI equation. This calculation has not been validated in all  clinical situations. eGFR's persistently <60 mL/min signify possible Chronic Kidney Disease.    Anion gap 9 5 - 15  Ethanol (ETOH)     Status: None   Collection Time: 02/06/15  8:56 PM  Result Value Ref Range   Alcohol, Ethyl (B) <5 <5 mg/dL    Comment:        LOWEST DETECTABLE LIMIT FOR SERUM ALCOHOL IS 5 mg/dL FOR MEDICAL PURPOSES ONLY   Salicylate level     Status: None   Collection Time: 02/06/15  8:56 PM  Result Value Ref Range   Salicylate Lvl <7.1 2.8 - 30.0 mg/dL  Urine rapid drug screen (hosp performed)not at [facility]     Status: None   Collection Time: 02/06/15  9:55 PM  Result Value Ref Range   Opiates NONE DETECTED NONE DETECTED   Cocaine NONE DETECTED NONE DETECTED   Benzodiazepines NONE DETECTED NONE DETECTED   Amphetamines NONE DETECTED NONE DETECTED   Tetrahydrocannabinol NONE DETECTED NONE DETECTED   Barbiturates NONE DETECTED NONE DETECTED    Comment:        DRUG SCREEN FOR MEDICAL PURPOSES ONLY.  IF CONFIRMATION IS NEEDED FOR ANY PURPOSE, NOTIFY LAB WITHIN 5 DAYS.        LOWEST DETECTABLE LIMITS FOR URINE DRUG SCREEN Drug Class       Cutoff (ng/mL) Amphetamine      1000 Barbiturate      200 Benzodiazepine   219 Tricyclics       758 Opiates          300 Cocaine          300 THC              50     Vitals: Blood pressure 117/57, pulse 69, temperature 98 F (36.7 C), temperature source Oral, resp. rate 17, weight 4.4 kg (9 lb 11.2 oz), SpO2 98 %.  Risk to Self: Suicidal Ideation: Yes-Currently Present Suicidal Intent: Yes-Currently Present Is patient at risk for suicide?: Yes Suicidal Plan?: Yes-Currently Present Specify Current Suicidal Plan: Pt reported that he would take sleeping pills.   Access to Means: Yes Specify Access to Suicidal Means: Pt has access to prescription medication. What has been your use of drugs/alcohol within the last 12 months?: Pt reported that he had a 2-12oz beers today. Pt reported that he is a social drinker.  How many times?: 1 Other Self Harm Risks: No other self harm risk identified at this time.  Triggers for Past Attempts: Unpredictable Intentional Self Injurious Behavior: None Risk to Others: Homicidal Ideation: No Thoughts of Harm to Others: No Current Homicidal Intent: No Current Homicidal Plan: No Access to Homicidal Means: No Identified Victim: NA History of harm to others?: No Assessment of Violence: On admission Violent Behavior Description: No violent behaviors observed. Pt is calm and cooperative at this time.  Does patient have access to weapons?: No Criminal Charges Pending?: No Does patient have a court date: No Prior Inpatient Therapy: Prior Inpatient Therapy: Yes Prior Therapy Dates: 1190-2016 Prior Therapy Facilty/Provider(s): [facility]a [facility], [place], [place], [place], [facility]  Reason for Treatment: Schizoaffective, bipolar  Prior Outpatient Therapy: Prior Outpatient Therapy: Yes Prior Therapy Dates: 2009-present  Prior Therapy Facilty/Provider(s): Dr. Smoot/Dr. Derrek Gu  Reason for Treatment: Schizoaffective, bipolar  Does patient have an ACCT team?: Yes Does patient have Intensive In-House Services?  : No Does patient have [facility] services? : Yes Does patient have [facility] services?: No  Current Facility-Administered Medications  Medication Dose Route Frequency Provider Last Rate  Last Dose  . acetaminophen (TYLENOL) tablet 650 mg  650 mg Oral Q8H PRN Ernestina Patches, MD      . alum & mag hydroxide-simeth (MAALOX/MYLANTA) 200-200-20 MG/5ML suspension 30 mL  30 mL Oral PRN Ernestina Patches, MD      . ibuprofen (ADVIL,MOTRIN) tablet 600 mg  600 mg Oral Q8H PRN Ernestina Patches, MD      . metFORMIN (GLUCOPHAGE) tablet 1,000 mg  1,000 mg Oral  BID WC Ernestina Patches, MD   1,000 mg at 02/07/15 0819  . metoprolol succinate (TOPROL-XL) 24 hr tablet 50 mg  50 mg Oral Daily Ernestina Patches, MD   50 mg at 02/07/15 1100  . nicotine (NICODERM CQ - dosed in mg/24 hours) patch 14 mg  14 mg Transdermal Daily Ernestina Patches, MD   14 mg at 02/07/15 1216  . simvastatin (ZOCOR) tablet 10 mg  10 mg Oral QHS Ernestina Patches, MD   10 mg at 02/06/15 2316  . zolpidem (AMBIEN) tablet 5 mg  5 mg Oral QHS PRN Ernestina Patches, MD   5 mg at 02/06/15 2316   Current Outpatient Prescriptions  Medication Sig Dispense Refill  . clonazePAM (KLONOPIN) 0.5 MG tablet Take 1 tablet (0.5 mg total) by mouth every 8 (eight) hours. (Patient taking differently: Take 0.5 mg by mouth every 8 (eight) hours as needed for anxiety. ) 30 tablet 0  . divalproex (DEPAKOTE) 500 MG DR tablet Take 2 tablets (1,000 mg total) by mouth 2 (two) times daily. (Patient taking differently: Take 500 mg by mouth 2 (two) times daily. ) 120 tablet 0  . lithium carbonate (ESKALITH) 450 MG CR tablet Take 450-900 mg by mouth 2 (two) times daily. Take 2 tablets every morning and 1 tablet every night  3  . metFORMIN (GLUCOPHAGE) 1000 MG tablet Take 1 tablet (1,000 mg total) by mouth 2 (two) times daily with a meal. 60 tablet 0  . metoprolol succinate (TOPROL-XL) 50 MG 24 hr tablet Take 1 tablet (50 mg total) by mouth daily. Take with or immediately following a meal. 30 tablet 0  . simvastatin (ZOCOR) 10 MG tablet Take 1 tablet (10 mg total) by mouth at bedtime. 30 tablet 0  . amantadine (SYMMETREL) 100 MG capsule Take 1 capsule (100 mg total) by mouth 2 (two) times daily. (Patient not taking: Reported on 02/06/2015) 30 capsule 0  . lithium carbonate (LITHOBID) 300 MG CR tablet Take 1 tablet (300 mg total) by mouth daily. (Patient not taking: Reported on 02/06/2015) 30 tablet 0  . lithium carbonate (LITHOBID) 300 MG CR tablet Take 2 tablets (600 mg total) by mouth at bedtime. (Patient not taking: Reported on  02/06/2015) 60 tablet 0  . nicotine (NICODERM CQ - DOSED IN MG/24 HOURS) 14 mg/24hr patch Place 1 patch (14 mg total) onto the skin daily. (Patient not taking: Reported on 02/06/2015) 28 patch 0  . paliperidone (INVEGA SUSTENNA) 156 MG/ML SUSP injection Inject 1 mL (156 mg total) into the muscle once. (Patient not taking: Reported on 02/06/2015) 0.9 mL 0  . paliperidone (INVEGA) 9 MG 24 hr tablet Take 1 tablet (9 mg total) by mouth every morning. (Patient not taking: Reported on 02/06/2015) 30 tablet 0    Musculoskeletal: Strength & Muscle Tone: within normal limits Gait & Station: normal Patient leans: N/A  Psychiatric Specialty Exam: Physical Exam  Review of Systems  Psychiatric/Behavioral: Positive for depression, suicidal ideas and hallucinations (hearing imaginary people knock on his door, paranoid ideation). The patient is nervous/anxious and  has insomnia.   All other systems reviewed and are negative.   Blood pressure 117/57, pulse 69, temperature 98 F (36.7 C), temperature source Oral, resp. rate 17, weight 4.4 kg (9 lb 11.2 oz), SpO2 98 %.Body mass index is 1.39 kg/(m^2).  General Appearance: Casual and Disheveled  Eye Contact::  Fair  Speech:  Clear and Coherent and Normal Rate  Volume:  Normal  Mood:  Anxious and Irritable  Affect:  Non-Congruent  Thought Process:  Circumstantial  Orientation:  Full (Time, Place, and Person)  Thought Content:  Hallucinations: Paranoid, hearing sounds that are not there  Suicidal Thoughts:  No  Homicidal Thoughts:  No  Memory:  Immediate;   Fair Recent;   Fair Remote;   Fair  Judgement:  Impaired  Insight:  Lacking  Psychomotor Activity:  Increased  Concentration:  Good  Recall:  Good  Fund of Knowledge:Good  Language: Good  Akathisia:  No  Handed:    AIMS (if indicated):     Assets:  Communication Skills Desire for Improvement  ADL's:  Impaired  Cognition: WNL  Sleep:      Medical Decision Making: New problem, with additional  work up planned, Review of Psycho-Social Stressors (1), Review or order clinical lab tests (1), Review of Medication Regimen & Side Effects (2) and Review of New Medication or Change in Dosage (2)  Treatment Plan Summary: Daily contact with patient to assess and evaluate symptoms and progress in treatment and Medication management  Plan:  Recommend psychiatric Inpatient admission when medically cleared.  Disposition:  -Inpatient psychiatric hospitalization for safety and stabilization  Benjamine Mola, FNP-BC 02/07/2015 3:52 PM Patient seen face-to-face for psychiatric evaluation, chart reviewed and case discussed with the physician extender and developed treatment plan. Reviewed the information documented and agree with the treatment plan. Corena Pilgrim, MD

## 2015-02-07 NOTE — Progress Notes (Signed)
Did not attended 

## 2015-02-07 NOTE — Progress Notes (Signed)
[age] year old male pt admitted on involuntary basis. Pt reports that he recently moved into his own place and spoke about hearing a knocking on the door and when he looks no one is there. Pt reports feeling paranoid, and suicidal. On admission, pt denied any SI and able to contract for safety on the unit. Pt also reports that he feels his medications are no longer working like they should. [facility]. Pt stated that he might like to explore an option about going to a group home from here. Pt was oriented to the unit and safety maintained.

## 2015-02-07 NOTE — [facility] Counselor (Signed)
[facility] Assessment Progress Note  Per Julieanne Cottonina, AC, [facility] Nurse Kendal HymenEdie, has been notified. Pt is IVC and [facility].   Johny ShockSamantha M. Ladona Ridgelaylor, MS, NCC Disposition Counselor

## 2015-02-07 NOTE — ED Notes (Signed)
Pt alert and oriented.  Denies SI HI and AVH this morning.  He said he slept well and feels better today.  Pt. Has a tremor in his hands and appears very anxious.  15 minute checks in place.

## 2015-02-08 DIAGNOSIS — F3164 Bipolar disorder, current episode mixed, severe, with psychotic features: Secondary | ICD-10-CM

## 2015-02-08 DIAGNOSIS — R45851 Suicidal ideations: Secondary | ICD-10-CM

## 2015-02-08 NOTE — [facility] Suicide Risk Assessment (Signed)
[facility]   Nursing information obtained from:    Demographic factors:    Current Mental Status:    Loss Factors:    Historical Factors:    Risk Reduction Factors:    Total Time spent with patient: 45 minutes Principal Problem: Bipolar disorder with psychotic features Diagnosis:   Patient Active Problem List   Diagnosis Date Noted  . Bipolar disorder with psychotic features [F31.9] 02/08/2015  . Suicidal ideation [R45.851] 02/07/2015  . Bipolar 1 disorder with moderate mania [F31.12] 02/07/2015  . Paranoia [F22]   . Bipolar I disorder, most recent episode (or current) manic [F31.10] 11/28/2014  . HTN (hypertension) [I10] 11/28/2014  . Dyslipidemia [E78.5] 11/28/2014  . Obesity [E66.9] 11/28/2014  . Diabetes [E11.9] 11/28/2014  . Tobacco use disorder [Z72.0] 11/28/2014     Continued Clinical Symptoms:  Alcohol Use Disorder Identification Test Final Score (AUDIT): 1 The "Alcohol Use Disorders Identification Test", Guidelines for Use in Primary Care, Second Edition.  World Science writerHealth Organization [facility](WHO). Score between 0-7:  no or low risk or alcohol related problems. Score between 8-15:  moderate risk of alcohol related problems. Score between 16-19:  high risk of alcohol related problems. Score 20 or above:  warrants further diagnostic evaluation for alcohol dependence and treatment.   CLINICAL FACTORS:   Bipolar Disorder:   Depressive phase Currently Psychotic   Psychiatric Specialty Exam: Physical Exam  ROS  Blood pressure 108/79, pulse 88, temperature 98.5 F (36.9 C), temperature source Oral, resp. rate 18, height 5' 9.5" (1.765 m), weight 104.781 kg (231 lb).Body mass index is 33.64 kg/(m^2).    COGNITIVE FEATURES THAT CONTRIBUTE TO RISK:  Closed-mindedness, Polarized thinking and Thought constriction (tunnel vision)    SUICIDE RISK:   Moderate:  Frequent suicidal ideation with limited intensity, and duration, some specificity in terms of  plans, no associated intent, good self-control, limited dysphoria/symptomatology, some risk factors present, and identifiable protective factors, including available and accessible social support.  PLAN OF CARE: Supportive approach/coping skills                               Bipolar disorder; optimize response to the mood stabilizers                               Paranoia; resume the Invega at 3 mg                               Improve reality testing                               Explore placement options  Medical Decision Making:  Review of Psycho-Social Stressors (1), Review or order clinical lab tests (1), Review of Medication Regimen & Side Effects (2) and Review of New Medication or Change in Dosage (2)  I certify that inpatient services furnished can reasonably be expected to improve the patient's condition.   Andi Layfield A 02/08/2015, 4:59 PM

## 2015-02-08 NOTE — [facility] Group Notes (Signed)
  02/08/2015 2:53 PM  Type of Therapy:  Group Therapy  Participation Level:  Minimal, came for last few minutes of group, was meeting w MD.  Modes of Intervention:  Discussion, Exploration and Problem-solving  Summary of Progress/Problems:  CSW facilitated group on Feelings about Diagnosis.  Patients were encouraged to explore their reactions to being diagnosed w mental illness, labels, stigma and expectations of self and others post diagnosis.  Patients explored constructive use of self to develop greater insight and resiliency regarding recovery from mental illness.  Sallee LangeCunningham, Anne C 02/08/2015, 2:53 PM

## 2015-02-08 NOTE — [facility] Group Notes (Signed)
[facility] Group Notes:  (Nursing/MHT/Case Management/Adjunct)  Date:  02/08/2015  Time:  0900  Type of Therapy:  Nurse Education  Participation Level:  Active  Participation Quality:  Appropriate and Attentive  Affect:  Appropriate  Cognitive:  Appropriate  Insight:  Appropriate  Engagement in Group:  Engaged  Modes of Intervention:  Education  Summary of Progress/Problems: Patient was attentive and engaged for group.  Patient shared that one place he would like to visit is "any beach, I just want to go to the beach".  Larry SierrasMiddleton, Armilda Vanderlinden P 02/08/2015, 12:09 PM

## 2015-02-08 NOTE — Progress Notes (Signed)
Patient ID: Greg [name]., male   DOB: 11/23/1965, 49 y.o.   MRN: [id number] PER STATE REGULATIONS 482.30  THIS CHART WAS REVIEWED FOR MEDICAL NECESSITY WITH RESPECT TO THE PATIENT'S ADMISSION/DURATION OF STAY.  NEXT REVIEW DATE: 02/11/15  Loura HaltBARBARA Raneisha Bress, RN, BSN CASE MANAGER

## 2015-02-08 NOTE — Progress Notes (Signed)
D- Patient is pleasant and appears anxious.  He is observed in the milieu interacting well with his peers.  Currently denies SI, HI, AVH, and pain.  On patient's self-inventory sheet, he denies any feelings of depression, anxiety, and hopelessness. A- Support and encouragement provided.  Routine safety checks conducted every 15 minutes.  Patient informed to notify staff with problems or concerns. R- Patient contracts for safety at this time. Patient receptive, calm, and cooperative. Patient remains safe at this time.
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
## 2015-02-08 NOTE — Progress Notes (Signed)
Adult Psychoeducational Group Note  Date:  02/08/2015 Time:  9:21 PM  Group Topic/Focus:  Wrap-Up Group:   The focus of this group is to help patients review their daily goal of treatment and discuss progress on daily workbooks.  Participation Level:  Active  Participation Quality:  Appropriate  Affect:  Appropriate  Cognitive:  Appropriate  Insight: Appropriate  Engagement in Group:  Engaged  Modes of Intervention:  Discussion  Additional Comments: The patient expressed that he attended group.The patient also said that group today was very helpful.  Octavio Mannshigpen, Decarlo Rivet Lee 02/08/2015, 9:21 PM

## 2015-02-08 NOTE — BHH Counselor (Signed)
Adult Comprehensive Assessment  Patient ID: Greg CoxRichard F Wiley Jr., male   DOB: 10/18/1965, 49 y.o.   MRN: [id number]  Information Source: Information source: Patient  Current Stressors:  Educational / Learning stressors: some college Employment / Job issues: on disability Family Relationships: parents upset by his current relationship Neurosurgeonw exotic dancer Financial / Lack of resources (include bankruptcy): no issues Housing / Lack of housing: cannot live in parents house, has been living in Nurse, children'ssmall milk house on their property Physical health (include injuries & life threatening diseases): no concerns Social relationships: socially isolated, has support from ACT team, therapist and parents, wants peer group relationships Substance abuse: none Bereavement / Loss: sister died in [id number]  Living/Environment/Situation:  Living Arrangements: Alone Living conditions (as described by patient or guardian): Lives in milk house on parent's farm,  How long has patient lived in current situation?: several, weeks What is atmosphere in current home: Temporary (appreciates parents support, used to live in their house but was kicked out due to parents concerns about his behavior w Materials engineerexotic dancer)  Family History:  Marital status: Other (comment) (marriage annulled in [id number]) Does patient have children?: No  Childhood History:  By whom was/is the patient raised?: Both parents Additional childhood history information: had good childhood w supportive parents Description of patient's relationship with caregiver when they were a child: good Patient's description of current relationship with people who raised him/her: involved, parents on his side, educated about mental illness, advocates Does patient have siblings?: Yes Number of Siblings: 1 Description of patient's current relationship with siblings: sister died at age 49 from aspiration secondary to seizure Did patient suffer any verbal/emotional/physical/sexual  abuse as a child?: No Did patient suffer from severe childhood neglect?: No Has patient ever been sexually abused/assaulted/raped as an adolescent or adult?: No Was the patient ever a victim of a crime or a disaster?: No Witnessed domestic violence?: No Has patient been effected by domestic violence as an adult?: No  Education:  Highest grade of school patient has completed: some Financial tradertechnical college, has GED Currently a Consulting civil engineerstudent?: No Learning disability?: No  Employment/Work Situation:   Employment situation: On disability Why is patient on disability: mental illness How long has patient been on disability: since [id number] Patient's job has been impacted by current illness: No What is the longest time patient has a held a job?: 1.5 years 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 Has patient ever been in the Eli Lilly and Companymilitary?: No Has patient ever served in Buyer, retailcombat?: No  Financial Resources:   Surveyor, quantityinancial resources: Insurance claims handlereceives SSDI, Medicaid, Medicare Does patient have a Lawyerrepresentative payee or guardian?: No  Alcohol/Substance Abuse:   What has been your use of drugs/alcohol within the last 12 months?: none If attempted suicide, did drugs/alcohol play a role in this?: No Alcohol/Substance Abuse Treatment Hx: Denies past history Has alcohol/substance abuse ever caused legal problems?: No  Social Support System:   Conservation officer, natureatient's Community Support System: Fair Museum/gallery exhibitions officerDescribe Community Support System: mostly parents and professionals, including ACT team and therapist Type of faith/religion: Pentecostal How does patient's faith help to cope with current illness?: speaks/interprets tongues, tongues guide him in decision making.    Leisure/Recreation:   Leisure and Hobbies: painting  Strengths/Needs:   What things does the patient do well?: painting In what areas does patient struggle / problems for patient: peer group relationships, paranoia - thinks people are talking about him because he is  driving a good car but on disabliity - car belongs to his  parents  Discharge Plan:   Does patient have access to transportation?: Yes Will patient be returning to same living situation after discharge?: Yes Currently receiving community mental health services: Yes (From Whom) Gretchen Short, therapist and [facility] ACT Team and Dr Otis Dials) If no, would patient like referral for services when discharged?: No Does patient have financial barriers related to discharge medications?: No  Summary/Recommendations:    Patient is a Caucasian male, admitted w diagnoses of bipolar disorder, some mania and psychosis.  Patient states that he wants "neuroleptics" to manage his mania.  Describes recent history of sexual preoccupation after having discontinued his antipsychotic medications due to concerns about sexual side effects.  Went to massage parlor in [place], became involved w Psychologist, sport and exercise - parents did not approve of this relationship and asked him to move out of their home if he continued relationship.  Now lives in small house [place] their farm.  Parents are involved and supportive, but have set boundaries w patient.  PAtient is articulate and aware of his symptoms, expresses desire for additional support, considering group home or ALF (has lived in these facilities before).  Aware that his manic symptoms have led to problems in life, including maxing out credit cards, sexual preoccupation, hyperspirituality.    Patient will benefit from hospitalization to receive psychoeducation and group therapy services to increase coping skills for and understanding of bipolar disorder, milieu therapy, medications management, and nursing support.  Patient will develop appropriate coping skills for dealing w overwhelming emotions, stabilize on medications, and develop greater insight into and acceptance of his current illness.  CSWs will develop discharge plan to include family support and referral to appropriate after care  services, patient wants to continue w Vesta [employer] and [employer].  Wants medications for stability, possible referral for additional support such as [facility] or group living situation. CSW will assess for resources.  Patient admits to smoking, declined Quitline referral.   Sallee Lange 02/08/2015

## 2015-02-09 LAB — VALPROIC ACID LEVEL: Valproic Acid Lvl: <10 ug/mL — ABNORMAL LOW (ref 50.0–100.0)

## 2015-02-09 LAB — LIPID PANEL
Cholesterol: 137 mg/dL (ref 0–200)
HDL: 35 mg/dL — ABNORMAL LOW (ref >40)
LDL Cholesterol: 76 mg/dL (ref 0–99)
Total CHOL/HDL Ratio: 3.9 RATIO
Triglycerides: 131 mg/dL (ref <150)
VLDL: 26 mg/dL (ref 0–40)

## 2015-02-09 LAB — LITHIUM LEVEL: Lithium Lvl: <0.06 mmol/L — ABNORMAL LOW (ref 0.60–1.20)

## 2015-02-09 MED ORDER — DIVALPROEX SODIUM ER 500 MG PO TB24
500.0000 mg | ORAL_TABLET | Freq: Two times a day (BID) | ORAL | Status: DC
  Administered 2015-02-09 – 2015-02-11 (×4): 500 mg via ORAL
  Filled 2015-02-09: qty 1
  Filled 2015-02-09: qty 6
  Filled 2015-02-09 (×4): qty 1
  Filled 2015-02-09: qty 6
  Filled 2015-02-09: qty 1
  Filled 2015-02-09: qty 6
  Filled 2015-02-09: qty 1
  Filled 2015-02-09: qty 6

## 2015-02-09 MED ORDER — METFORMIN HCL 500 MG PO TABS
1000.0000 mg | ORAL_TABLET | Freq: Two times a day (BID) | ORAL | Status: DC
  Administered 2015-02-10 – 2015-02-11 (×3): 1000 mg via ORAL
  Filled 2015-02-09 (×7): qty 2

## 2015-02-09 MED ORDER — PALIPERIDONE ER 3 MG PO TB24
3.0000 mg | ORAL_TABLET | Freq: Every day | ORAL | Status: DC
  Administered 2015-02-09 – 2015-02-11 (×3): 3 mg via ORAL
  Filled 2015-02-09: qty 1
  Filled 2015-02-09: qty 3
  Filled 2015-02-09 (×3): qty 1
  Filled 2015-02-09: qty 3
  Filled 2015-02-09: qty 1

## 2015-02-09 MED ORDER — LITHIUM CARBONATE ER 300 MG PO TBCR
600.0000 mg | EXTENDED_RELEASE_TABLET | Freq: Two times a day (BID) | ORAL | Status: DC
  Administered 2015-02-09 – 2015-02-11 (×4): 600 mg via ORAL
  Filled 2015-02-09 (×3): qty 12
  Filled 2015-02-09 (×7): qty 2
  Filled 2015-02-09: qty 12

## 2015-02-09 NOTE — Progress Notes (Cosign Needed)
D) Pt has been appropriate, cooperative on approach. Positive for all unit activities with minimal prompting. Pt denies any anxiety or depression this shift. Pt denies a/v hallucinations. Greg [name] has not been observed responding to any internal stimuli. Pt is fidgety, but has been able to sit through groups. A) Level 3 obs for safety, med ed reinforced. Positive reinforcement provided. R) Cooperative.

## 2015-02-09 NOTE — Progress Notes (Signed)
Assessment and progress note reviewed by Sheron Robin Eakes  

## 2015-02-09 NOTE — Progress Notes (Signed)
D: Pt denies SI/HI/AVH/pain at this time . Pt is pleasant and cooperative. Pt isolated to his room this evening, but did get pt up to take his medications and talk for a few minutes. Pt stated "I feel 100 % better"  A: Pt was offered support and encouragement. Pt was given scheduled medications. Pt was encourage to attend groups. Q 15 minute checks were done for safety.    R: Pt is taking medication. Pt has no complaints at this time .Pt receptive to treatment and safety maintained on unit.

## 2015-02-09 NOTE — [facility] Group Notes (Signed)
[facility]   02/09/2015 11:12 AM  Participation Quality:  Invited.  Chose to not attend    Kiribatiorth, Greg [name]

## 2015-02-09 NOTE — Plan of Care (Signed)
Problem: Ineffective individual coping Goal: LTG: Patient will report a decrease in negative feelings Outcome: Progressing Pt stated he felt better today  Problem: Alteration in mood Goal: LTG-Patient reports reduction in suicidal thoughts (Patient reports reduction in suicidal thoughts and is able to verbalize a safety plan for whenever patient is feeling suicidal)  Outcome: Progressing Pt denies SI at this time

## 2015-02-09 NOTE — Tx Team (Signed)
Interdisciplinary Treatment Plan Update (Adult)  Date:  02/09/2015   Time Reviewed:  2:14 PM   Progress in Treatment: Attending groups: Yes. Participating in groups:  Yes. Taking medication as prescribed:  Yes. Tolerating medication:  Yes. Family/Significant othe contact made:  No Patient understands diagnosis:  Yes  As evidenced by seeking help with depression and hallucinations Discussing patient identified problems/goals with staff:  Yes, see initial care plan. Medical problems stabilized or resolved:  Yes. Denies suicidal/homicidal ideation: Yes. Issues/concerns per patient self-inventory:  No. Other:  New problem(s) identified:  Discharge Plan or Barriers:  Return home, follow up outpt  Reason for Continuation of Hospitalization: Anxiety Depression Hallucinations Medication stabilization  Comments: Pt states that he is tried of being sick and wants to die; pt states that he had a plan to overdose on medication; pt states that he is wanting to get in to a more secure environment like a group home; pt states that is hearing voices yelling profanity in his head; pt also states that he hears people knocking and banging on his door and he is afraid; pt states that he is tired of living like this and just wants it all to end.  Invega trial [patient has been on it in the past, but stopped] Estimated length of stay: 2-3 days  New goal(s):  Review of initial/current patient goals per problem list:     Attendees: Patient:  02/09/2015 2:14 PM   Family:   02/09/2015 2:14 PM   Physician:  Geoffery LyonsIrving Lugo, MD 02/09/2015 2:14 PM   Nursing:   Bufford LopeJim Hiatt, RN 02/09/2015 2:14 PM   CSW:    Daryel Geraldodney Makalynn Berwanger, LCSW   02/09/2015 2:14 PM   Other:  02/09/2015 2:14 PM   Other:   02/09/2015 2:14 PM   Other:  Onnie BoerJennifer Clark, Nurse CM 02/09/2015 2:14 PM   Other:  Leisa LenzValerie Enoch, Vesta [facility] TCT 02/09/2015 2:14 PM   Other:  Tomasita Morrowelora Sutton, P4CC  02/09/2015 2:14 PM   Other:  02/09/2015 2:14 PM   Other:  02/09/2015 2:14 PM    Other:  02/09/2015 2:14 PM   Other:  02/09/2015 2:14 PM   Other:  02/09/2015 2:14 PM   Other:   02/09/2015 2:14 PM    Scribe for Treatment Team:   Ida RogueNorth, Samuel Mcpeek B, 02/09/2015 2:14 PM

## 2015-02-09 NOTE — [facility] Suicide Risk Assessment (Signed)
[facility] INPATIENT:  Family/Significant Other Suicide Prevention Education  Suicide Prevention Education:  Contact Attempts: Loura HaltLyndia Kleeburg, mother, [phone], (name of family member/significant other) has been identified by the patient as the family member/significant other with whom the patient will be residing, and identified as the person(s) who will aid the patient in the event of a mental health crisis.  With written consent from the patient, two attempts were made to provide suicide prevention education, prior to and/or following the patient's discharge.  We were unsuccessful in providing suicide prevention education.  A suicide education pamphlet was given to the patient to share with family/significant other.  Date and time of first attempt: 02/09/15/2:00 PM Date and time of second attempt: 02/10/15/6:00 PM  CSW was unable to reach mother, per patient, mother is very familiar w [facility].    Sallee LangeCunningham, Agron Swiney C 02/09/2015, 2:07 PM

## 2015-02-09 NOTE — [facility] Group Notes (Signed)
[facility]  02/09/2015 , 2:08 PM    Type of Therapy:  [employer] Presentation  Participation Level:  Active  Participation Quality:  Attentive  Affect:  Blunted  Cognitive:  Oriented  Insight:  Limited  Engagement in Therapy:  Engaged  Modes of Intervention:  Discussion, Education and Socialization  Summary of Progress/Problems:  Onalee HuaDavid from [employer] came to present his recovery story and play the guitar.  Initially c/o over stimulation and begged off.  Showed up in group 10 minutes later.  Was engaged throughout and stayed the entire time.  Daryel Geraldorth, Ryin Schillo B 02/09/2015 , 2:08 PM

## 2015-02-09 NOTE — Progress Notes (Signed)
[facility] MD Progress Note  02/09/2015 7:38 PM Greg [name].  MRN:  [id number] Subjective:  Greg [name] states that he has done a lot of thinking and that he would rather stay home with his parents. [place] they are there across the street. Now understands that the knocking was not real and that it was part of his paranoia. He is willing to get back on the [place]. States he lost his car so he will not have transportation. Under a manic episode he got a new car while having a perfectly fine one. He lost both, max out  his credit cards. Principal Problem: Bipolar disorder with psychotic features Diagnosis:   Patient Active Problem List   Diagnosis Date Noted  . Bipolar disorder with psychotic features [F31.9] 02/08/2015  . Suicidal ideation [R45.851] 02/07/2015  . Bipolar 1 disorder with moderate mania [F31.12] 02/07/2015  . Paranoia [F22]   . Bipolar I disorder, most recent episode (or current) manic [F31.10] 11/28/2014  . HTN (hypertension) [I10] 11/28/2014  . Dyslipidemia [E78.5] 11/28/2014  . Obesity [E66.9] 11/28/2014  . Diabetes [E11.9] 11/28/2014  . Tobacco use disorder [Z72.0] 11/28/2014   Total Time spent with patient: 30 minutes   Past Medical History:  Past Medical History  Diagnosis Date  . Diabetes   . Obesity   . Dyslipidemia   . HTN (hypertension)   . Schizo affective schizophrenia   . Paranoia    History reviewed. No pertinent past surgical history. Family History: History reviewed. No pertinent family history. Social History:  History  Alcohol Use No     History  Drug Use No    History   Social History  . Marital Status: Single    Spouse Name: N/A  . Number of Children: N/A  . Years of Education: N/A   Social History Main Topics  . Smoking status: Current Every Day Smoker -- 1.00 packs/day    Types: Cigarettes  . Smokeless tobacco: Current User    Types: Snuff  . Alcohol Use: No  . Drug Use: No  . Sexual Activity: Not  on file   Other Topics Concern  . None   Social History Narrative   Additional History:    Sleep: Fair  Appetite:  Fair   Assessment:   Musculoskeletal: Strength & Muscle Tone: within normal limits Gait & Station: normal Patient leans: normal   Psychiatric Specialty Exam: Physical Exam  Review of Systems  Constitutional: Negative.   HENT: Negative.   Eyes: Negative.   Respiratory: Negative.   Cardiovascular: Negative.   Gastrointestinal: Negative.   Genitourinary: Negative.   Musculoskeletal: Negative.   Skin: Negative.   Neurological: Negative.   Endo/Heme/Allergies: Negative.   Psychiatric/Behavioral: Positive for depression and hallucinations. The patient is nervous/anxious.     Blood pressure 132/87, pulse 90, temperature 98.6 F (37 C), temperature source Oral, resp. rate 18, height 5' 9.5" (1.765 m), weight 104.781 kg (231 lb).Body mass index is 33.64 kg/(m^2).  General Appearance: Fairly Groomed  Patent attorney::  Fair  Speech:  Blocked  Volume:  fluctuates  Mood:  Anxious and worried  Affect:  anxious worried  Thought Process:  Coherent and Goal Directed  Orientation:  Full (Time, Place, and Person)  Thought Content:  symptoms events worries concerns  Suicidal Thoughts:  No  Homicidal Thoughts:  No  Memory:  Immediate;   Fair Recent;   Fair Remote;   Fair  Judgement:  Fair  Insight:  Present  and Shallow  Psychomotor Activity:  Restlessness  Concentration:  Fair  Recall:  FiservFair  Fund of Knowledge:Fair  Language: Fair  Akathisia:  No  Handed:  Right  AIMS (if indicated):     Assets:  Desire for Improvement Housing Social Support  ADL's:  Intact  Cognition: WNL  Sleep:  Number of Hours: 6.75     Current Medications: Current Facility-Administered Medications  Medication Dose Route Frequency Provider Last Rate Last Dose  . acetaminophen (TYLENOL) tablet 650 mg  650 mg Oral Q6H PRN Kerry HoughSpencer E Simon, PA-C      . alum & mag hydroxide-simeth  (MAALOX/MYLANTA) 200-200-20 MG/5ML suspension 30 mL  30 mL Oral Q4H PRN Kerry HoughSpencer E Simon, PA-C      . hydrOXYzine (ATARAX/VISTARIL) tablet 25 mg  25 mg Oral Q6H PRN Kerry HoughSpencer E Simon, PA-C   25 mg at 02/09/15 1732  . magnesium hydroxide (MILK OF MAGNESIA) suspension 30 mL  30 mL Oral Daily PRN Kerry HoughSpencer E Simon, PA-C      . nicotine polacrilex (NICORETTE) gum 2 mg  2 mg Oral PRN Jomarie LongsSaramma Eappen, MD   2 mg at 02/08/15 1706  . traZODone (DESYREL) tablet 50 mg  50 mg Oral QHS,MR X 1 Kerry HoughSpencer E Simon, PA-C   50 mg at 02/07/15 2124    Lab Results:  Results for orders placed or performed during the hospital encounter of 02/07/15 (from the past 48 hour(s))  Lipid panel     Status: Abnormal   Collection Time: 02/09/15  6:20 AM  Result Value Ref Range   Cholesterol 137 0 - 200 mg/dL   Triglycerides 161131 <096<150 mg/dL   HDL 35 (L) >04>40 mg/dL   Total CHOL/HDL Ratio 3.9 RATIO   VLDL 26 0 - 40 mg/dL   LDL Cholesterol 76 0 - 99 mg/dL    Comment:        Total Cholesterol/HDL:CHD Risk Coronary Heart Disease Risk Table                     Men   Women  1/2 Average Risk   3.4   3.3  Average Risk       5.0   4.4  2 X Average Risk   9.6   7.1  3 X Average Risk  23.4   11.0        Use the calculated Patient Ratio above and the CHD Risk Table to determine the patient's CHD Risk.        ATP III CLASSIFICATION (LDL):  <100     mg/dL   Optimal  540-981100-129  mg/dL   Near or Above                    Optimal  130-159  mg/dL   Borderline  191-478160-189  mg/dL   High  >295>190     mg/dL   Very High [facility]   Valproic acid level     Status: Abnormal   Collection Time: 02/09/15  6:20 AM  Result Value Ref Range   Valproic Acid Lvl <10 (L) 50.0 - 100.0 ug/mL    Comment: RESULTS CONFIRMED BY MANUAL DILUTION [facility]   Lithium level     Status: Abnormal   Collection Time: 02/09/15  6:20 AM  Result Value Ref Range   Lithium Lvl <0.06 (L) 0.60 - 1.20 mmol/L    Comment:  [facility]formed at [facility]  Physical Findings: AIMS: Facial and Oral Movements Muscles of Facial Expression: None, normal Lips and Perioral Area: None, normal Jaw: None, normal Tongue: None, normal,Extremity Movements Upper (arms, wrists, hands, fingers): None, normal Lower (legs, knees, ankles, toes): None, normal, Trunk Movements Neck, shoulders, hips: None, normal, Overall Severity Severity of abnormal movements (highest score from questions above): None, normal Incapacitation due to abnormal movements: None, normal Patient's awareness of abnormal movements (rate only patient's report): No Awareness, Dental Status Current problems with teeth and/or dentures?: No Does patient usually wear dentures?: No  CIWA:    COWS:     Treatment Plan Summary: Daily contact with patient to assess and evaluate symptoms and progress in treatment and Medication management Supportive approach/coping skills Mood instability; will maintain his Depakote and his Lithium Paranoia;will resume his Invega at 3 mg daily Work to improve reality testing Work on stress management/life style changes to better manage his illness  Medical Decision Making:  Review of Psycho-Social Stressors (1), Review of Medication Regimen & Side Effects (2) and Review of New Medication or Change in Dosage (2)     Delphina Schum A 02/09/2015, 7:38 PM

## 2015-02-10 LAB — HEMOGLOBIN A1C
Hgb A1c MFr Bld: 7 % — ABNORMAL HIGH (ref 4.8–5.6)
Mean Plasma Glucose: 154 mg/dL

## 2015-02-10 NOTE — [facility] Group Notes (Signed)
Type of Therapy:  Group Therapy  Participation Level:  Active, but left halfway through group  Participation Quality:  Appropriate  Affect:  Excited  Cognitive:  Alert  Insight:  Developing/Improving  Engagement in Therapy:  Engaged  Modes of Intervention:  Discussion, Exploration and Support  Summary of Progress/Problems: The topic for group was balance in life.  Pt participated in the discussion about when their life was in balance and out of balance and how this feels.  Pt discussed ways to get back in balance and short term goals they can work on to get where they want to be.  Patient discussed role of "overthinking" in his stress/triggers leading to hospitalization - affirmed peer who struggles w same issue, discussed possible solutions.    Santa GeneraAnne Cunningham, LCSW Clinical Social Worker .t

## 2015-02-10 NOTE — Progress Notes (Signed)
Adult Psychoeducational Group Note  Date:  02/10/2015 Time:  3:50 PM  Group Topic/Focus:  Goals Group:   The focus of this group is to help patients establish daily goals to achieve during treatment and discuss how the patient can incorporate goal setting into their daily lives to aide in recovery.  Participation Level:  Active  Participation Quality:  Appropriate, Attentive, Sharing and Supportive  Affect:  Appropriate and Excited  Cognitive:  Appropriate  Insight: Appropriate  Engagement in Group:  Supportive  Modes of Intervention:  Discussion  Additional Comments:  Pt was extremely passionate about sharing his accomplishments and stories. Pt was extremely supportive and interacted well with other patients.   Edmonia CaprioSuthaharan, Barnabas Henriques 02/10/2015, 3:50 PM

## 2015-02-10 NOTE — Plan of Care (Signed)
Problem: Ineffective individual coping Goal: STG: Patient will remain free from self harm Outcome: Progressing Patient has remained free from self harm.     

## 2015-02-10 NOTE — Progress Notes (Signed)
[facility]  02/10/2015 5:54 PM Greg Wilhemina CashF Zuver Jr.  MRN:  [id number] Subjective:  Greg [name] states that he cant explain why his Lithium and Depakote levels were so low. States that he is pretty compliant with them although admits that there might have been several days he did not take then due to the decreased libido and his wanting to have some sort of sexual encounters with the male he got involved with. But states this is not going to be an issue anymore as he is not planning to pursue a relationship anymore Principal Problem: Bipolar disorder with psychotic features Diagnosis:   Patient Active Problem List   Diagnosis Date Noted  . Bipolar disorder with psychotic features [F31.9] 02/08/2015  . Suicidal ideation [R45.851] 02/07/2015  . Bipolar 1 disorder with moderate mania [F31.12] 02/07/2015  . Paranoia [F22]   . Bipolar I disorder, most recent episode (or current) manic [F31.10] 11/28/2014  . HTN (hypertension) [I10] 11/28/2014  . Dyslipidemia [E78.5] 11/28/2014  . Obesity [E66.9] 11/28/2014  . Diabetes [E11.9] 11/28/2014  . Tobacco use disorder [Z72.0] 11/28/2014   Total Time spent with patient: 30 minutes   Past Medical History:  Past Medical History  Diagnosis Date  . Diabetes   . Obesity   . Dyslipidemia   . HTN (hypertension)   . Schizo affective schizophrenia   . Paranoia    History reviewed. No pertinent past surgical history. Family History: History reviewed. No pertinent family history. Social History:  History  Alcohol Use No     History  Drug Use No    History   Social History  . Marital Status: Single    Spouse Name: N/A  . Number of Children: N/A  . Years of Education: N/A   Social History Main Topics  . Smoking status: Current Every Day Smoker -- 1.00 packs/day    Types: Cigarettes  . Smokeless tobacco: Current User    Types: Snuff  . Alcohol Use: No  . Drug Use: No  . Sexual Activity: Not on file   Other Topics Concern  .  None   Social History Narrative   Additional History:    Sleep: Fair  Appetite:  Fair   Assessment:   Musculoskeletal: Strength & Muscle Tone: within normal limits Gait & Station: normal Patient leans: normal   Psychiatric Specialty Exam: Physical Exam  Review of Systems  Constitutional: Negative.   HENT: Negative.   Eyes: Negative.   Respiratory: Negative.   Cardiovascular: Negative.   Gastrointestinal: Negative.   Genitourinary: Negative.   Musculoskeletal: Negative.   Skin: Negative.   Neurological: Negative.   Endo/Heme/Allergies: Negative.   Psychiatric/Behavioral: The patient is nervous/anxious.     Blood pressure 116/84, pulse 107, temperature 98.8 F (37.1 C), temperature source Oral, resp. rate 20, height 5' 9.5" (1.765 m), weight 104.781 kg (231 lb).Body mass index is 33.64 kg/(m^2).  General Appearance: Fairly Groomed  Patent attorneyye Contact::  Fair  Speech:  Clear and Coherent  Volume:  Normal  Mood:  Anxious and worried  Affect:  anxious worried  Thought Process:  Coherent and Goal Directed  Orientation:  Full (Time, Place, and Person)  Thought Content:  symptoms events worries concerns  Suicidal Thoughts:  No  Homicidal Thoughts:  No  Memory:  Immediate;   Fair Recent;   Fair Remote;   Fair  Judgement:  Fair  Insight:  Present  Psychomotor Activity:  Restlessness  Concentration:  Fair  Recall:  FiservFair  Fund of Knowledge:Fair  Language: Fair  Akathisia:  No  Handed:  Right  AIMS (if indicated):     Assets:  Desire for Improvement Housing Social Support  ADL's:  Intact  Cognition: WNL  Sleep:  Number of Hours: 6.75     Current Medications: Current Facility-Administered Medications  Medication Dose Route Frequency Provider Last Rate Last Dose  . acetaminophen (TYLENOL) tablet 650 mg  650 mg Oral Q6H PRN Kerry Hough, PA-C      . alum & mag hydroxide-simeth (MAALOX/MYLANTA) 200-200-20 MG/5ML suspension 30 mL  30 mL Oral Q4H PRN Kerry Hough, PA-C      . divalproex (DEPAKOTE ER) 24 hr tablet 500 mg  500 mg Oral BID Rachael Fee, MD   500 mg at 02/10/15 1636  . hydrOXYzine (ATARAX/VISTARIL) tablet 25 mg  25 mg Oral Q6H PRN Kerry Hough, PA-C   25 mg at 02/09/15 1732  . lithium carbonate (LITHOBID) CR tablet 600 mg  600 mg Oral Q12H Rachael Fee, MD   600 mg at 02/10/15 0815  . magnesium hydroxide (MILK OF MAGNESIA) suspension 30 mL  30 mL Oral Daily PRN Kerry Hough, PA-C      . metFORMIN (GLUCOPHAGE) tablet 1,000 mg  1,000 mg Oral BID WC Rachael Fee, MD   1,000 mg at 02/10/15 1636  . nicotine polacrilex (NICORETTE) gum 2 mg  2 mg Oral PRN Jomarie Longs, MD   2 mg at 02/08/15 1706  . paliperidone (INVEGA) 24 hr tablet 3 mg  3 mg Oral Daily Rachael Fee, MD   3 mg at 02/10/15 0815  . traZODone (DESYREL) tablet 50 mg  50 mg Oral QHS,MR X 1 Kerry Hough, PA-C   50 mg at 02/07/15 2124    Lab Results:  Results for orders placed or performed during the hospital encounter of 02/07/15 (from the past 48 hour(s))  Hemoglobin A1c     Status: Abnormal   Collection Time: 02/09/15  6:20 AM  Result Value Ref Range   Hgb A1c MFr Bld 7.0 (H) 4.8 - 5.6 %    Comment: (NOTE)         Pre-diabetes: 5.7 - 6.4         Diabetes: >6.4         Glycemic control for adults with diabetes: <7.0    Mean Plasma Glucose 154 mg/dL    Comment: (NOTE) Performed At: [facility] [address] [place], [place] [id number] Mila Homer MD OZ:[id number] Performed at [facility]   Lipid panel     Status: Abnormal   Collection Time: 02/09/15  6:20 AM  Result Value Ref Range   Cholesterol 137 0 - 200 mg/dL   Triglycerides 629 <528 mg/dL   HDL 35 (L) >41 mg/dL   Total CHOL/HDL Ratio 3.9 RATIO   VLDL 26 0 - 40 mg/dL   LDL Cholesterol 76 0 - 99 mg/dL    Comment:        Total Cholesterol/HDL:CHD Risk Coronary Heart Disease Risk Table                     Men   Women  1/2 Average Risk   3.4   3.3  Average Risk        5.0   4.4  2 X Average Risk   9.6   7.1  3 X Average Risk  23.4   11.0  Use the calculated Patient Ratio above and the CHD Risk Table to determine the patient's CHD Risk.        ATP III CLASSIFICATION (LDL):  <100     mg/dL   Optimal  098-119  mg/dL   Near or Above                    Optimal  130-159  mg/dL   Borderline  147-829  mg/dL   High  >562     mg/dL   Very High Performed at [facility]   Valproic acid level     Status: Abnormal   Collection Time: 02/09/15  6:20 AM  Result Value Ref Range   Valproic Acid Lvl <10 (L) 50.0 - 100.0 ug/mL    Comment: RESULTS CONFIRMED BY MANUAL DILUTION Performed at [facility]   Lithium level     Status: Abnormal   Collection Time: 02/09/15  6:20 AM  Result Value Ref Range   Lithium Lvl <0.06 (L) 0.60 - 1.20 mmol/L    Comment: Performed at [facility]    Physical Findings: AIMS: Facial and Oral Movements Muscles of Facial Expression: None, normal Lips and Perioral Area: None, normal Jaw: None, normal Tongue: None, normal,Extremity Movements Upper (arms, wrists, hands, fingers): Minimal Lower (legs, knees, ankles, toes): None, normal, Trunk Movements Neck, shoulders, hips: None, normal, Overall Severity Severity of abnormal movements (highest score from questions above): None, normal Incapacitation due to abnormal movements: None, normal Patient's awareness of abnormal movements (rate only patient's report): No Awareness, Dental Status Current problems with teeth and/or dentures?: No Does patient usually wear dentures?: No  CIWA:    COWS:     Treatment Plan Summary: Daily contact with patient to assess and evaluate symptoms and progress in treatment and Medication management Supportive approach/coping skills Mood disorder; will continue to establish the Depakote and the Lithium. He states that the combination has always been better than each medication by  itself Paranoia; will continue the Invega 3 mg daily CBT/mindfulness  Medical Decision Making:  Review of Psycho-Social Stressors (1), Review of Medication Regimen & Side Effects (2) and Review of New Medication or Change in Dosage (2)     Camillia Marcy A 02/10/2015, 5:54 PM

## 2015-02-10 NOTE — Progress Notes (Signed)
D:Per patient self inventory form patient reports he slept good last night without the use of sleep medication. He reports a good appetite, normal energy level, good concentration. He rates depression 0/10, hopelessness 0/10, anxiety 0/10 ; all on 0-10 scale, 10 being the worse. He denies SI/HI. Denies AVH. He denies physical pain. Presents with anxiety, slight tremor noted. Reports he is ready for discharge home. Noted pacing the 500 hallway.   A:Special checks q 15 mins in place for safety. Medication administered per MD order (see eMAR). Encouragement and support provided. MD notified of slight tremor.   R:Safety maintained. Compliant with medication regimen. Socializing with peers in the milieu. Will continue to monitor.

## 2015-02-11 MED ORDER — METFORMIN HCL 1000 MG PO TABS: 1000.0000 mg | ORAL_TABLET | Freq: Two times a day (BID) | ORAL | Status: DC

## 2015-02-11 MED ORDER — DIVALPROEX SODIUM ER 500 MG PO TB24: 500.0000 mg | ORAL_TABLET | Freq: Two times a day (BID) | ORAL | Status: DC

## 2015-02-11 MED ORDER — LITHIUM CARBONATE ER 300 MG PO TBCR: 600.0000 mg | EXTENDED_RELEASE_TABLET | Freq: Two times a day (BID) | ORAL | Status: DC

## 2015-02-11 MED ORDER — TRAZODONE HCL 50 MG PO TABS: 50.0000 mg | ORAL_TABLET | Freq: Every evening | ORAL | Status: DC | PRN

## 2015-02-11 MED ORDER — PALIPERIDONE ER 3 MG PO TB24: 3.0000 mg | ORAL_TABLET | Freq: Every day | ORAL | Status: DC

## 2015-02-11 MED ORDER — METOPROLOL SUCCINATE ER 50 MG PO TB24: 50.0000 mg | ORAL_TABLET | Freq: Every day | ORAL | Status: DC

## 2015-02-11 MED ORDER — SIMVASTATIN 10 MG PO TABS: 10.0000 mg | ORAL_TABLET | Freq: Every day | ORAL | Status: DC

## 2015-02-11 MED ORDER — NICOTINE POLACRILEX 2 MG MT GUM: 2.0000 mg | CHEWING_GUM | OROMUCOSAL | Status: DC | PRN

## 2015-02-11 NOTE — [facility] Suicide Risk Assessment (Signed)
[facility] INPATIENT:  Family/Significant Other Suicide Prevention Education  Suicide Prevention Education:  Education Completed; Greg [name] - mother - [phone],  (name of family member/significant other) has been identified by the patient as the family member/significant other with whom the patient will be residing, and identified as the person(s) who will aid the patient in the event of a mental health crisis (suicidal ideations/suicide attempt).  With written consent from the patient, the family member/significant other has been provided the following suicide prevention education, prior to the and/or following the discharge of the patient.  The suicide prevention education provided includes the following:  Suicide risk factors  Suicide prevention and interventions  National Suicide Hotline telephone number  [facility] assessment telephone number  [facility] Assistance 911  [facility] and/or Residential Mobile Crisis Unit telephone number  Request made of family/significant other to:  Remove weapons (e.g., guns, rifles, knives), all items previously/currently identified as safety concern.    Remove drugs/medications (over-the-counter, prescriptions, illicit drugs), all items previously/currently identified as a safety concern.  The family member/significant other verbalizes understanding of the suicide prevention education information provided.  The family member/significant other agrees to remove the items of safety concern listed above.  Per mother, patient can become discouraged because "there is nothing for him to do during the day", no access to firearms in the home.  Mother given opportunity to ask questions and address concerns, no further issues identified      Sallee LangeCunningham, Greg [name], 8:52 AM

## 2015-02-11 NOTE — Discharge Summary (Signed)
Physician Discharge Summary Note  Patient:  Greg CoxRichard F Haefner Jr. is an 49 y.o., male MRN:  [id number] DOB:  08/22/1965 Patient phone:  [phone] (home)  Patient address:   [address] [place] [address],  Total Time spent with patient: 30 minutes  Date of Admission:  02/07/2015 Date of Discharge: 02/11/15  Reason for Admission:  Mood stabilization treatments   Principal Problem: Bipolar disorder with psychotic features Discharge Diagnoses: Patient Active Problem List   Diagnosis Date Noted  . Bipolar disorder with psychotic features [F31.9] 02/08/2015  . Suicidal ideation [R45.851] 02/07/2015  . Bipolar 1 disorder with moderate mania [F31.12] 02/07/2015  . Paranoia [F22]   . Bipolar I disorder, most recent episode (or current) manic [F31.10] 11/28/2014  . HTN (hypertension) [I10] 11/28/2014  . Dyslipidemia [E78.5] 11/28/2014  . Obesity [E66.9] 11/28/2014  . Diabetes [E11.9] 11/28/2014  . Tobacco use disorder [Z72.0] 11/28/2014    Musculoskeletal: Strength & Muscle Tone: within normal limits Gait & Station: normal Patient leans: N/A  Psychiatric Specialty Exam: Physical Exam  Psychiatric: He has a normal mood and affect. His speech is normal and behavior is normal. Judgment and thought content normal. Cognition and memory are normal.    Review of Systems  Constitutional: Negative.   HENT: Negative.   Eyes: Negative.   Respiratory: Negative.   Cardiovascular: Negative.   Gastrointestinal: Negative.   Genitourinary: Negative.   Musculoskeletal: Negative.   Skin: Negative.   Neurological: Negative.   Endo/Heme/Allergies: Negative.   Psychiatric/Behavioral: Negative for depression, suicidal ideas, hallucinations, memory loss and substance abuse. The patient is not nervous/anxious and does not have insomnia.     Blood pressure 150/80, pulse 107, temperature 97.9 F (36.6 C), temperature source Oral, resp. rate 16, height 5' 9.5" (1.765 m), weight 104.781  kg (231 lb).Body mass index is 33.64 kg/(m^2).  See Physician SRA     Have you used any form of tobacco in the last 30 days? (Cigarettes, Smokeless Tobacco, Cigars, and/or Pipes): Yes  Has this patient used any form of tobacco in the last 30 days? (Cigarettes, Smokeless Tobacco, Cigars, and/or Pipes) Yes, A prescription for an FDA-approved tobacco cessation medication was offered at discharge and the patient refused  Past Medical History:  Past Medical History  Diagnosis Date  . Diabetes   . Obesity   . Dyslipidemia   . HTN (hypertension)   . Schizo affective schizophrenia   . Paranoia    History reviewed. No pertinent past surgical history. Family History: History reviewed. No pertinent family history. Social History:  History  Alcohol Use No     History  Drug Use No    History   Social History  . Marital Status: Single    Spouse Name: N/A  . Number of Children: N/A  . Years of Education: N/A   Social History Main Topics  . Smoking status: Current Every Day Smoker -- 1.00 packs/day    Types: Cigarettes  . Smokeless tobacco: Current User    Types: Snuff  . Alcohol Use: No  . Drug Use: No  . Sexual Activity: Not on file   Other Topics Concern  . None   Social History Narrative   Risk to Self: Is patient at risk for suicide?: Yes What has been your use of drugs/alcohol within the last 12 months?: none Risk to Others:   Prior Inpatient Therapy:   Yes Prior Outpatient Therapy:   Yes  Level of Care:  OP  Hospital Course:  Greg [name]. is an 49 y.o. male presenting to [facility] reporting suicidal ideations with a plan to overdose with sleeping pills. Patient also reported that he is paranoid. Patient stated "I am paranoid and I have suicidal thoughts". "I started hearing voices and hearing someone knocking on the door at 5:30 in the morning". "I have been off of my medication for 2 days". "My doctor doesn't think that I am schizoaffective he thinks that its  more so bipolar". Patient reported that he has been having suicidal thoughts for the past 3 days. Patient reported that he has attempted suicide once in the past. Patient shared that approximately 18 years ago he brought a rifle; but his father ended up returning it to the store. Patient did not report any self-injurious behaviors or a family history of suicide. Patient shared that he is followed by the [facility]. Patient reported that he has been hospitalized multiple times in the past. Patient is endorsing multiple depressive symptoms and shared that he is dealing with multiple stressors. Patient reported that he is dealing with financial stressors due to maxing out his credit cards during a manic episode. He also reported that he parents brought him a car but he got a different car. Patient reported that his credit is bad and he feels as if the creditors are trying to kill him. Patient denies HI at this time and did not report any aggressive or violent behaviors. Patient is endorsing auditory hallucinations and shared that the voices are unfamiliar. Patient stated "they are saying f u, mf'er and kill". Patient did not report any alcohol or illicit substance abuse but shared that he drinks socially.         Greg [name]. was admitted to the adult 500 unit. He was evaluated and his symptoms were identified. Medication management was discussed and initiated. Patient was started on Depakote ER 500 mg twice daily for mood control. His Lithium was increased to 600 mg every twelve hours for mood lability. He reported needing two mood stabilizers to control his mood.  He was started on Invega 3 mg daily for paranoia. Patient reported noncompliance prior to admission, which explained the low blood levels. He was oriented to the unit and encouraged to participate in unit programming. Medical problems were identified and treated appropriately. Home medication was restarted as needed.        The patient  was evaluated each day by a clinical provider to ascertain the patient's response to treatment.  Improvement was noted by the patient's report of decreasing symptoms, improved sleep and appetite, affect, medication tolerance, behavior, and participation in unit programming.  He was asked each day to complete a self inventory noting mood, mental status, pain, new symptoms, anxiety and concerns.         He responded well to medication and being in a therapeutic and supportive environment. Positive and appropriate behavior was noted and the patient was motivated for recovery.  The patient worked closely with the treatment team and case manager to develop a discharge plan with appropriate goals. Coping skills, problem solving as well as relaxation therapies were also part of the unit programming.         By the day of discharge he was in much improved condition than upon admission.  Symptoms were reported as significantly decreased or resolved completely. The patient denied SI/HI and voiced no AVH. He was motivated to continue taking medication with a goal of continued improvement in mental  health.   Greg [name]. was discharged home with a plan to follow up as noted below. The patient was provided with sample medications and prescriptions at time of discharge. He left [facility] in stable condition with all belongings returned to him.    Consults:  psychiatry  Significant Diagnostic Studies:  UDS negative, Lithium/Depakote level, Lipid panel, Chemistry panel CBC   Discharge Vitals:   Blood pressure 150/80, pulse 107, temperature 97.9 F (36.6 C), temperature source Oral, resp. rate 16, height 5' 9.5" (1.765 m), weight 104.781 kg (231 lb). Body mass index is 33.64 kg/(m^2). Lab Results:   Results for orders placed or performed during the hospital encounter of 02/07/15 (from the past 72 hour(s))  Hemoglobin A1c     Status: Abnormal   Collection Time: 02/09/15  6:20 AM  Result Value Ref Range   Hgb A1c  MFr Bld 7.0 (H) 4.8 - 5.6 %    Comment: (NOTE)         Pre-diabetes: 5.7 - 6.4         Diabetes: >6.4         Glycemic control for adults with diabetes: <7.0    Mean Plasma Glucose 154 mg/dL    Comment: (NOTE) Performed At: [facility] [address] [place], [place] [id number] Mila Homer MD WU:[id number] Performed at [facility]   Lipid panel     Status: Abnormal   Collection Time: 02/09/15  6:20 AM  Result Value Ref Range   Cholesterol 137 0 - 200 mg/dL   Triglycerides 956 <213 mg/dL   HDL 35 (L) >08 mg/dL   Total CHOL/HDL Ratio 3.9 RATIO   VLDL 26 0 - 40 mg/dL   LDL Cholesterol 76 0 - 99 mg/dL    Comment:        Total Cholesterol/HDL:CHD Risk Coronary Heart Disease Risk Table                     Men   Women  1/2 Average Risk   3.4   3.3  Average Risk       5.0   4.4  2 X Average Risk   9.6   7.1  3 X Average Risk  23.4   11.0        Use the calculated Patient Ratio above and the CHD Risk Table to determine the patient's CHD Risk.        ATP III CLASSIFICATION (LDL):  <100     mg/dL   Optimal  657-846  mg/dL   Near or Above                    Optimal  130-159  mg/dL   Borderline  962-952  mg/dL   High  >841     mg/dL   Very High Performed at [facility]   Valproic acid level     Status: Abnormal   Collection Time: 02/09/15  6:20 AM  Result Value Ref Range   Valproic Acid Lvl <10 (L) 50.0 - 100.0 ug/mL    Comment: RESULTS CONFIRMED BY MANUAL DILUTION Performed at [facility]   Lithium level     Status: Abnormal   Collection Time: 02/09/15  6:20 AM  Result Value Ref Range   Lithium Lvl <0.06 (L) 0.60 - 1.20 mmol/L    Comment: Performed at [facility]    Physical Findings: AIMS: Facial and Oral Movements Muscles of Facial  Expression: None, normal Lips and Perioral Area: None, normal Jaw: None, normal Tongue: None, normal,Extremity Movements Upper (arms, wrists, hands,  fingers): None, normal Lower (legs, knees, ankles, toes): None, normal, Trunk Movements Neck, shoulders, hips: None, normal, Overall Severity Severity of abnormal movements (highest score from questions above): None, normal Incapacitation due to abnormal movements: None, normal Patient's awareness of abnormal movements (rate only patient's report): No Awareness, Dental Status Current problems with teeth and/or dentures?: No Does patient usually wear dentures?: No  CIWA:    COWS:      See Psychiatric Specialty Exam and Suicide Risk Assessment completed by Attending Physician prior to discharge.  Discharge destination:  Home  Is patient on multiple antipsychotic therapies at discharge:  No   Has Patient had three or more failed trials of antipsychotic monotherapy by history:  No  Recommended Plan for Multiple Antipsychotic Therapies: NA      Discharge Instructions    Discharge instructions    Complete by:  As directed   Please follow up with your Primary Care Provider for management of chronic medical problems such as Hypertension and elevated cholesterol levels. Information in chart indicates the need for refills for Toprol and Zocor from Primary Care.            Medication List    STOP taking these medications        amantadine 100 MG capsule  Commonly known as:  SYMMETREL     clonazePAM 0.5 MG tablet  Commonly known as:  KLONOPIN     divalproex 500 MG DR tablet  Commonly known as:  DEPAKOTE  Replaced by:  divalproex 500 MG 24 hr tablet     nicotine 14 mg/24hr patch  Commonly known as:  NICODERM CQ - dosed in mg/24 hours     paliperidone 156 MG/ML Susp injection  Commonly known as:  INVEGA SUSTENNA      TAKE these medications      Indication   divalproex 500 MG 24 hr tablet  Commonly known as:  DEPAKOTE ER  Take 1 tablet (500 mg total) by mouth 2 (two) times daily.   Indication:  Manic Phase of Manic-Depression     lithium carbonate 300 MG CR tablet   Commonly known as:  LITHOBID  Take 2 tablets (600 mg total) by mouth every 12 (twelve) hours. For mood control   Indication:  Manic-Depression     metFORMIN 1000 MG tablet  Commonly known as:  GLUCOPHAGE  Take 1 tablet (1,000 mg total) by mouth 2 (two) times daily with a meal.   Indication:  Antipsychotic Therapy-Induced Weight Gain, Type 2 Diabetes     metoprolol succinate 50 MG 24 hr tablet  Commonly known as:  TOPROL-XL  Take 1 tablet (50 mg total) by mouth daily. Take with or immediately following a meal.   Indication:  High Blood Pressure     nicotine polacrilex 2 MG gum  Commonly known as:  NICORETTE  Take 1 each (2 mg total) by mouth as needed for smoking cessation.   Indication:  Nicotine Addiction     paliperidone 3 MG 24 hr tablet  Commonly known as:  INVEGA  Take 1 tablet (3 mg total) by mouth daily.   Indication:  Psychosis     simvastatin 10 MG tablet  Commonly known as:  ZOCOR  Take 1 tablet (10 mg total) by mouth at bedtime.   Indication:  Cerebrovascular Accident or Stroke, Inherited Heterozygous Hypercholesterolemia     traZODone 50 MG  tablet  Commonly known as:  DESYREL  Take 1 tablet (50 mg total) by mouth at bedtime and may repeat dose one time if needed.   Indication:  Trouble Sleeping       Follow-up Information    Follow up with Daiva Eves, LCSW On 02/15/2015.   Why:  Has appointment w this therapist on 02/15/15 at 1 PM.  Please call to confirm.   Contact information:   [address]  [place], [place] [address] Phone: [phone] Fax: [phone]       Go to [facility].   Why:  Call them to find out when you are to see them again.   Contact information:   [address]  [place] [address] Phone: [phone] Fax:  [phone] (ACT Team fax #)      Follow-up recommendations:   Activity: as tolerated Diet: low salt, low carb, low cholesterol  Tests: per PCP Other: f/u with therapist and [facility]  Comments:   Take all your  medications as prescribed by your mental healthcare provider.  Report any adverse effects and or reactions from your medicines to your outpatient provider promptly.  Patient is instructed and cautioned to not engage in alcohol and or illegal drug use while on prescription medicines.  In the event of worsening symptoms, patient is instructed to call the crisis hotline, 911 and or go to the nearest ED for appropriate evaluation and treatment of symptoms.  Follow-up with your primary care provider for your other medical issues, concerns and or health care needs.   Total Discharge Time: Greater than 30 minutes  Signed: Fransisca Kaufmann, NP-C 02/11/2015, 6:47 PM  I personally assessed the patient and formulated the plan Madie Reno A. Dub Mikes, M.D.

## 2015-02-11 NOTE — Clinical Social Work Note (Signed)
CSW spoke w mother, states pt seems to be calmer and "more realistic" - states that "once he gets home, verything will change."  Pt complains about side effects of medications, complains particularly about anti-psychotics.  PT paces at home, mother says that antipsychotics make pt "shaky" on inside,have muscle tremor (thumb), then pt will begin to eliminate various medications in order to eliminate bothersome side effects. Mother says pt's daily routine is taking medication and sleeping.  Mother relates details of extensive and lengthy history of mental illness struggles and placements in group living situations.  Remains committed to helping w patient's care, grateful for help from ACT team and therapist.  Would like to be informed of discharge plans.  Santa GeneraAnne Antwyne Pingree, LCSW Clinical Social Worker

## 2015-02-11 NOTE — Progress Notes (Signed)
Pt is D/C home. Pt denies SI/HI/AV. Pt follow-up and medications were reviewed and pt verbalized understanding. Pt pleasant and cooperative. Pt belongings were returned.   

## 2015-02-11 NOTE — Progress Notes (Signed)
D Pt. Denies SI and HI, no complaints of pain or discomfort noted at present time.  A Writer offered support and encouragement,  Discussed discharge plans with pt.  R Pt. Remains safe on the unit, reports that his Mother oversees his medications to ensure that he takes them as prescribed.  Pt. Reports he is ready for discharge and writer ensures pt. That MD will discharge him when he feels the pt. Is safe to return home.

## 2015-02-11 NOTE — Progress Notes (Signed)
  [facility] :  Will you be returning to the same living situation after discharge:  Yes,  home At discharge, do you have transportation home?: Yes,  family Do you have the ability to pay for your medications: Yes,  MCD  Release of information consent forms completed and in the chart;  Patient's signature needed at discharge.  Patient to Follow up at: Follow-up Information    [facility]   Why:  Has appointment w this therapist on 02/15/15 at 1 PM.  Please call to confirm.   Contact information:   [address]  [place], [place] [address] Phone: [phone] Fax: [phone]) [phone]       [facility]   Why:  Call them to find out when you are to see them again.   Contact information:   [address]  [place] [address] Phone: [phone] Fax:  [phone] (ACT Team fax #)      Patient denies SI/HI: Yes,  yes    Safety Planning and Suicide Prevention discussed: Yes,  yes  Have you used any form of tobacco in the last 30 days? (Cigarettes, Smokeless Tobacco, Cigars, and/or Pipes): Yes  Has patient been referred to the Quitline?: Patient refused referral  Ida Rogueorth, Jesus Nevills B 02/11/2015, 11:55 AM

## 2015-02-11 NOTE — Tx Team (Signed)
Interdisciplinary Treatment Plan Update (Adult)  Date:  02/11/2015   Time Reviewed:  11:52 AM   Progress in Treatment: Attending groups: Yes. Participating in groups:  Yes. Taking medication as prescribed:  Yes. Tolerating medication:  Yes. Family/Significant othe contact made:  Yes Patient understands diagnosis:  Yes   Discussing patient identified problems/goals with staff:  Yes, see initial care plan. Medical problems stabilized or resolved:  Yes. Denies suicidal/homicidal ideation: Yes. Issues/concerns per patient self-inventory:  No. Other:  New problem(s) identified:  Discharge Plan or Barriers:  return home, follow up outpt  Reason for Continuation of Hospitalization:   Comments:  Estimated length of stay: D/C today  New goal(s):  Review of initial/current patient goals per problem list:     Attendees: Patient:  02/11/2015 11:52 AM   Family:   02/11/2015 11:52 AM   Physician:  Geoffery LyonsIrving Lugo, MD 02/11/2015 11:52 AM   Nursing:   Rodman KeyJanet Webb, RN 02/11/2015 11:52 AM   CSW:    Daryel Geraldodney Phill Steck, LCSW   02/11/2015 11:52 AM   Other:  02/11/2015 11:52 AM   Other:   02/11/2015 11:52 AM   Other:  Onnie BoerJennifer Clark, Nurse CM 02/11/2015 11:52 AM   Other:  Leisa LenzValerie Enoch, [facility] T[facility] 02/11/2015 11:52 AM   Other:  Tomasita Morrowelora Sutton, P4CC  02/11/2015 11:52 AM   Other:  02/11/2015 11:52 AM   Other:  02/11/2015 11:52 AM   Other:  02/11/2015 11:52 AM   Other:  02/11/2015 11:52 AM   Other:  02/11/2015 11:52 AM   Other:   02/11/2015 11:52 AM    Scribe for Treatment Team:   Ida RogueNorth, Alverda Nazzaro B, 02/11/2015 11:52 AM

## 2015-02-11 NOTE — [facility] Suicide Risk Assessment (Signed)
[facility] Discharge Suicide Risk Assessment   Demographic Factors:  Male, Caucasian, Low socioeconomic status, Living alone and Unemployed  Total Time spent with patient: 30 minutes  Musculoskeletal: Strength & Muscle Tone: within normal limits Gait & Station: normal Patient leans: N/A  Psychiatric Specialty Exam: Physical Exam  ROS  Blood pressure 150/80, pulse 107, temperature 97.9 F (36.6 C), temperature source Oral, resp. rate 16, height 5' 9.5" (1.765 m), weight 104.781 kg (231 lb).Body mass index is 33.64 kg/(m^2).  General Appearance: Casual and Fairly Groomed  Patent attorney::  Fair  Speech:  Normal Rate409  Volume:  Normal  Mood:  Euthymic  Affect:  Appropriate, Congruent and Full Range  Thought Process:  Coherent, Goal Directed, Linear and Logical  Orientation:  Full (Time, Place, and Person)  Thought Content:  Negative  Suicidal Thoughts:  No  Homicidal Thoughts:  No  Memory:  Immediate;   Fair Recent;   Fair Remote;   Fair  Judgement:  Fair  Insight:  Fair  Psychomotor Activity:  Normal  Concentration:  Fair  Recall:  [employer] of Knowledge:Fair  Language: Fair  Akathisia:  Negative  Handed:  Right  AIMS (if indicated):     Assets:  Communication Skills Desire for Improvement Financial Resources/Insurance Housing Social Support Talents/Skills  Sleep:  Number of Hours: 6.75  Cognition: WNL  ADL's:  Intact   Have you used any form of tobacco in the last 30 days? (Cigarettes, Smokeless Tobacco, Cigars, and/or Pipes): Yes  Has this patient used any form of tobacco in the last 30 days? (Cigarettes, Smokeless Tobacco, Cigars, and/or Pipes) Yes, A prescription for an FDA-approved tobacco cessation medication was offered at discharge and the patient refused  Mental Status Per Nursing Assessment::   On Admission:     Current Mental Status by Physician: Pt interviewed. Chart reviewed. Pt reports feeling much better since being admitted, and the paranoia has  resolved. Mood is better. Sleep/appetite good. Mild hand tremor noted (due to lithium), but no other side effects reported. Pt denies current SI/HI/AVH/paranoia. Pt reports readiness for discharge, and will be living in an apartment in front of his parents' house. No akathisia reported.  Loss Factors: NA  Historical Factors: NA  Risk Reduction Factors:   Sense of responsibility to family, Religious beliefs about death, Positive social support and Positive therapeutic relationship  Continued Clinical Symptoms:  Bipolar Disorder:   Mixed State  Cognitive Features That Contribute To Risk:  Thought constriction (tunnel vision)    Suicide Risk:  Minimal: No identifiable suicidal ideation.  Patients presenting with no risk factors but with morbid ruminations; may be classified as minimal risk based on the severity of the depressive symptoms  Principal Problem: Bipolar disorder with psychotic features Discharge Diagnoses:  Patient Active Problem List   Diagnosis Date Noted  . Bipolar disorder with psychotic features [F31.9] 02/08/2015  . Suicidal ideation [R45.851] 02/07/2015  . Bipolar 1 disorder with moderate mania [F31.12] 02/07/2015  . Paranoia [F22]   . Bipolar I disorder, most recent episode (or current) manic [F31.10] 11/28/2014  . HTN (hypertension) [I10] 11/28/2014  . Dyslipidemia [E78.5] 11/28/2014  . Obesity [E66.9] 11/28/2014  . Diabetes [E11.9] 11/28/2014  . Tobacco use disorder [Z72.0] 11/28/2014    Follow-up Information    Follow up with [facility], LCSW On 02/15/2015.   Why:  Has appointment w this therapist on 02/15/15 at 1 PM.  Please call to confirm.   Contact information:   [address] [place],  [place] [id number] Phone: [phone] Fax: [phone]       [facility]   Why:  Call them to find out when you are to see them again.   Contact information:   [address]  [place] [id number] Phone: [phone] Fax:  [phone] (ACT Team fax #)       Plan Of Care/Follow-up recommendations:  Activity:  as tolerated Diet:  low salt, low carb, low cholesterol  Tests:  per PCP Other:  [facility]  Is patient on multiple antipsychotic therapies at discharge:  No   Has Patient had three or more failed trials of antipsychotic monotherapy by history:  No  Recommended Plan for Multiple Antipsychotic Therapies: NA    Saige Canton 02/11/2015, 12:01 PM

## 2015-02-15 DIAGNOSIS — F250 Schizoaffective disorder, bipolar type: Secondary | ICD-10-CM | POA: Diagnosis not present

## 2015-02-23 DIAGNOSIS — F250 Schizoaffective disorder, bipolar type: Secondary | ICD-10-CM | POA: Diagnosis not present

## 2015-03-21 DIAGNOSIS — F250 Schizoaffective disorder, bipolar type: Secondary | ICD-10-CM | POA: Diagnosis not present

## 2015-04-04 DIAGNOSIS — F250 Schizoaffective disorder, bipolar type: Secondary | ICD-10-CM | POA: Diagnosis not present

## 2015-04-05 DIAGNOSIS — F250 Schizoaffective disorder, bipolar type: Secondary | ICD-10-CM | POA: Diagnosis not present

## 2015-04-05 DIAGNOSIS — F319 Bipolar disorder, unspecified: Secondary | ICD-10-CM | POA: Diagnosis not present

## 2015-04-09 ENCOUNTER — Encounter: Payer: Self-pay | Admitting: Emergency Medicine

## 2015-04-09 ENCOUNTER — Emergency Department
Admission: EM | Admit: 2015-04-09 | Discharge: 2015-04-11 | Disposition: A | Discharge status: Other Acute Inpatient Hospital | Payer: Medicare Other | Attending: Student | Admitting: Student

## 2015-04-09 DIAGNOSIS — Z720 Tobacco use: Secondary | ICD-10-CM | POA: Insufficient documentation

## 2015-04-09 DIAGNOSIS — Z79899 Other long term (current) drug therapy: Secondary | ICD-10-CM | POA: Diagnosis not present

## 2015-04-09 DIAGNOSIS — E119 Type 2 diabetes mellitus without complications: Secondary | ICD-10-CM | POA: Insufficient documentation

## 2015-04-09 DIAGNOSIS — I10 Essential (primary) hypertension: Secondary | ICD-10-CM | POA: Diagnosis not present

## 2015-04-09 DIAGNOSIS — F22 Delusional disorders: Secondary | ICD-10-CM | POA: Insufficient documentation

## 2015-04-09 DIAGNOSIS — Z046 Encounter for general psychiatric examination, requested by authority: Secondary | ICD-10-CM | POA: Diagnosis present

## 2015-04-09 HISTORY — DX: Bipolar disorder, unspecified: F31.9

## 2015-04-09 LAB — CBC
HCT: 44.6 % (ref 40.0–52.0)
Hemoglobin: 14.5 g/dL (ref 13.0–18.0)
MCH: 27.9 pg (ref 26.0–34.0)
MCHC: 32.6 g/dL (ref 32.0–36.0)
MCV: 85.5 fL (ref 80.0–100.0)
Platelets: 253 10*3/uL (ref 150–440)
RBC: 5.21 MIL/uL (ref 4.40–5.90)
RDW: 13.6 % (ref 11.5–14.5)
WBC: 9.4 10*3/uL (ref 3.8–10.6)

## 2015-04-09 LAB — COMPREHENSIVE METABOLIC PANEL
ALT: 20 U/L (ref 17–63)
AST: 25 U/L (ref 15–41)
Albumin: 4.3 g/dL (ref 3.5–5.0)
Alkaline Phosphatase: 45 U/L (ref 38–126)
Anion gap: 10 (ref 5–15)
BUN: 25 mg/dL — ABNORMAL HIGH (ref 6–20)
CO2: 26 mmol/L (ref 22–32)
Calcium: 9.7 mg/dL (ref 8.9–10.3)
Chloride: 103 mmol/L (ref 101–111)
Creatinine, Ser: 1.17 mg/dL (ref 0.61–1.24)
GFR calc Af Amer: >60 mL/min (ref >60)
GFR calc non Af Amer: >60 mL/min (ref >60)
Glucose, Bld: 161 mg/dL — ABNORMAL HIGH (ref 65–99)
Potassium: 4.3 mmol/L (ref 3.5–5.1)
Sodium: 139 mmol/L (ref 135–145)
Total Bilirubin: 0.7 mg/dL (ref 0.3–1.2)
Total Protein: 7.9 g/dL (ref 6.5–8.1)

## 2015-04-09 LAB — URINE DRUG SCREEN, QUALITATIVE (ARMC ONLY)
Amphetamines, Ur Screen: NOT DETECTED
Barbiturates, Ur Screen: NOT DETECTED
Benzodiazepine, Ur Scrn: NOT DETECTED
Cannabinoid 50 Ng, Ur ~~LOC~~: NOT DETECTED
Cocaine Metabolite,Ur ~~LOC~~: NOT DETECTED
MDMA (Ecstasy)Ur Screen: NOT DETECTED
Methadone Scn, Ur: NOT DETECTED
Opiate, Ur Screen: NOT DETECTED
Phencyclidine (PCP) Ur S: NOT DETECTED
Tricyclic, Ur Screen: NOT DETECTED

## 2015-04-09 LAB — SALICYLATE LEVEL: Salicylate Lvl: <4 mg/dL (ref 2.8–30.0)

## 2015-04-09 LAB — ETHANOL: Alcohol, Ethyl (B): <5 mg/dL (ref <5)

## 2015-04-09 LAB — VALPROIC ACID LEVEL: Valproic Acid Lvl: 99 ug/mL (ref 50.0–100.0)

## 2015-04-09 LAB — ACETAMINOPHEN LEVEL: Acetaminophen (Tylenol), Serum: <10 ug/mL — ABNORMAL LOW (ref 10–30)

## 2015-04-09 NOTE — ED Notes (Signed)
Report called from Elpidio Galea, pt to move to [facility] room 3.

## 2015-04-09 NOTE — ED Notes (Signed)
Pt brought into ED BHU via sally port accompanied by RN Darl Pikes and ED [facility]. Pt  wanded with metal detector for safety by ODS Officer McLamb. Patient oriented to unit/care area: Pt informed of unit policies and procedures.  Informed that, for their safety, care areas are designed for safety and monitored by security cameras at all times; and visiting hours explained to patient. Patient verbalizes understanding, and verbal contract for safety obtained.Pt shown to their room--BHU 3.   Patient assigned to appropriate care area. Patient oriented to unit/care area: Informed that, for their safety, care areas are designed for safety and monitored by security cameras at all times; and visiting hours explained to patient. Patient verbalizes understanding, and verbal contract for safety obtained.  BEHAVIORAL HEALTH ROUNDING Patient sleeping: No. Patient alert and oriented: yes Behavior appropriate: Yes.   Nutrition and fluids offered: Yes  Toileting and hygiene offered: Yes  Sitter present: q15 min observations and security camera monitoring Law enforcement present: Yes [employer]  ENVIRONMENTAL ASSESSMENT Potentially harmful objects out of patient reach: Yes.   Personal belongings secured: Yes.   Patient dressed in hospital provided attire only: Yes.   Plastic bags out of patient reach: Yes.   Patient care equipment removed: Yes.   Equipment and supplies removed: Yes.   Potentially toxic materials out of patient reach: Yes.   Sharps container removed or out of patient reach: Yes  ED BHU PLACEMENT JUSTIFICATION Is the patient under IVC or is there intent for IVC: No. Is the patient medically cleared: Yes.   Is there vacancy in the ED BHU: Yes.   Is the population mix appropriate for patient: Yes Is the patient awaiting placement in inpatient or outpatient setting: unknown at this time Has the patient had a psychiatric consult: No   Survey of unit performed for contraband, proper placement  and condition of furniture, tampering with fixtures in bathroom, shower, and each patient room: Yes.  ; Findings: none APPEARANCE/BEHAVIOR Calm, cooperative NEURO ASSESSMENT Orientation: A&O x4 Hallucinations: No hallucinations noted at this time Speech: Normal Gait: normal RESPIRATORY ASSESSMENT No respiratory distress noted CARDIOVASCULAR ASSESSMENT Skin color appropriate for age and race GASTROINTESTINAL ASSESSMENT no GI distress noted EXTREMITIES Moves all extremities PLAN OF CARE Provide calm/safe environment. Vital signs assessed twice daily. ED BHU Assessment once each 12-hour shift. Collaborate with intake RN daily or as condition indicates. Assure the ED provider has rounded once each shift. Provide and encourage hygiene. Provide redirection as needed. Assess for escalating behavior; address immediately and inform ED provider.  Assess family dynamic and appropriateness for visitation as needed: Yes.   Educate the patient/family about BHU procedures/visitation: Yes.   [name]

## 2015-04-09 NOTE — ED Notes (Signed)
Pt BIB by police states he is agiated about [place] and [place] authorities .  Voluntary by police. Denies SI/HI at this time.

## 2015-04-09 NOTE — ED Notes (Signed)
BEHAVIORAL HEALTH ROUNDING Patient sleeping: No   Patient alert and oriented: Yes  Behavior appropriate: No ; If no, describe: Intermittently laughing uncontrollably at memories.  Nutrition and fluids offered: Yes  Toileting and hygiene offered: Yes  Sitter present: Yes   Law enforcement present: YES

## 2015-04-09 NOTE — ED Notes (Signed)

## 2015-04-09 NOTE — ED Notes (Signed)
Pt in room. No complaints or concerns voiced at this time. No abnormal behavior noted at this time. Will continue to monitor with q15 min checks and security camera monitoring. ODS officer in area. 

## 2015-04-09 NOTE — ED Notes (Signed)
BEHAVIORAL HEALTH ROUNDING  Patient sleeping: No  Patient alert and oriented: Yes Behavior appropriate: Yes.  Nutrition and fluids offered: Yes  Toileting and hygiene offered: Yes  Sitter present: yes  Law enforcement present: Yes   

## 2015-04-09 NOTE — ED Provider Notes (Signed)
[facility] Emergency Department Provider Note  ____________________________________________  Time seen: Approximately 6 PM  I have reviewed the triage vital signs and the nursing notes.   HISTORY  Chief Complaint Psychiatric Evaluation    HPI Greg [name]. is a 49 y.o. male with a history of schizoaffective disorder and schizophrenia who is presenting today with agitation over [place] and [place] authorities.  He says that he has been watching cable news and he "doesn't know the difference between the little fingers and [employer]."  He denies any suicidal or homicidal ideation. He denies any auditory or visual hallucinations.     Past Medical History  Diagnosis Date  . Diabetes   . Obesity   . Dyslipidemia   . HTN (hypertension)   . Schizo affective schizophrenia   . Paranoia   . Bipolar 1 disorder     Patient Active Problem List   Diagnosis Date Noted  . Bipolar disorder with psychotic features 02/08/2015  . Suicidal ideation 02/07/2015  . Bipolar 1 disorder with moderate mania 02/07/2015  . Paranoia   . Bipolar I disorder, most recent episode (or current) manic 11/28/2014  . HTN (hypertension) 11/28/2014  . Dyslipidemia 11/28/2014  . Obesity 11/28/2014  . Diabetes 11/28/2014  . Tobacco use disorder 11/28/2014    Past Surgical History  Procedure Laterality Date  . Tonsillectomy    . Appendectomy      Current Outpatient Rx  Name  Route  Sig  Dispense  Refill  . amantadine (SYMMETREL) 100 MG capsule   Oral   Take 100 mg by mouth 2 (two) times daily.         . divalproex (DEPAKOTE ER) 500 MG 24 hr tablet   Oral   Take 1 tablet (500 mg total) by mouth 2 (two) times daily. Patient taking differently: Take 1,000 mg by mouth 2 (two) times daily.    60 tablet   0   . paliperidone (INVEGA) 3 MG 24 hr tablet   Oral   Take 1 tablet (3 mg total) by mouth daily.   30 tablet   0   . lithium carbonate (LITHOBID)  300 MG CR tablet   Oral   Take 2 tablets (600 mg total) by mouth every 12 (twelve) hours. For mood control   120 tablet   0   . metFORMIN (GLUCOPHAGE) 1000 MG tablet   Oral   Take 1 tablet (1,000 mg total) by mouth 2 (two) times daily with a meal.   60 tablet   0   . metoprolol succinate (TOPROL-XL) 50 MG 24 hr tablet   Oral   Take 1 tablet (50 mg total) by mouth daily. Take with or immediately following a meal.   30 tablet   0   . nicotine polacrilex (NICORETTE) 2 MG gum   Oral   Take 1 each (2 mg total) by mouth as needed for smoking cessation.   100 tablet   0   . simvastatin (ZOCOR) 10 MG tablet   Oral   Take 1 tablet (10 mg total) by mouth at bedtime.   30 tablet   0   . traZODone (DESYREL) 50 MG tablet   Oral   Take 1 tablet (50 mg total) by mouth at bedtime and may repeat dose one time if needed.   60 tablet   0     Allergies Haldol; Risperdal; and Seroquel  No family history on file.  Social History Social History  Substance  Use Topics  . Smoking status: Current Every Day Smoker -- 1.00 packs/day    Types: Cigarettes  . Smokeless tobacco: Current User    Types: Snuff  . Alcohol Use: No    Review of Systems Constitutional: No fever/chills Eyes: No visual changes. ENT: No sore throat. Cardiovascular: Denies chest pain. Respiratory: Denies shortness of breath. Gastrointestinal: No abdominal pain.  No nausea, no vomiting.  No diarrhea.  No constipation. Genitourinary: Negative for dysuria. Musculoskeletal: Negative for back pain. Skin: Negative for rash. Neurological: Negative for headaches, focal weakness or numbness.  10-point ROS otherwise negative.  ____________________________________________   PHYSICAL EXAM:  VITAL SIGNS: ED Triage Vitals  Enc Vitals Group     BP 04/09/15 1636 158/90 mmHg     Pulse Rate 04/09/15 1636 109     Resp 04/09/15 1636 18     Temp 04/09/15 1636 98.6 F (37 C)     Temp Source 04/09/15 1636 Oral     SpO2  04/09/15 1636 97 %     Weight 04/09/15 1636 246 lb (111.585 kg)     Height 04/09/15 1636 5\' 10"  (1.778 m)     Head Cir --      Peak Flow --      Pain Score --      Pain Loc --      Pain Edu? --      Excl. in GC? --     Constitutional: Alert and oriented. Well appearing and in no acute distress. Eyes: Conjunctivae are normal. PERRL. EOMI. Head: Atraumatic. Nose: No congestion/rhinnorhea. Mouth/Throat: Mucous membranes are moist.  Oropharynx non-erythematous. Neck: No stridor.   Cardiovascular: Normal rate, regular rhythm. Grossly normal heart sounds.  Good peripheral circulation. Respiratory: Normal respiratory effort.  No retractions. Lungs CTAB. Gastrointestinal: Soft and nontender. No distention. No abdominal bruits. No CVA tenderness. Musculoskeletal: No lower extremity tenderness nor edema.  No joint effusions. Neurologic:  Normal speech and language. No gross focal neurologic deficits are appreciated. No gait instability. Skin:  Skin is warm, dry and intact. No rash noted. Psychiatric: Mood and affect are normal. Somewhat tangential speech. ____________________________________________   LABS (all labs ordered are listed, but only abnormal results are displayed)  Labs Reviewed  COMPREHENSIVE METABOLIC PANEL - Abnormal; Notable for the following:    Glucose, Bld 161 (*)    BUN 25 (*)    All other components within normal limits  ACETAMINOPHEN LEVEL - Abnormal; Notable for the following:    Acetaminophen (Tylenol), Serum <10 (*)    All other components within normal limits  ETHANOL  SALICYLATE LEVEL  CBC  URINE DRUG SCREEN, QUALITATIVE ([facility] ONLY)  VALPROIC ACID LEVEL   ____________________________________________  EKG   ____________________________________________  RADIOLOGY   ____________________________________________   PROCEDURES  ____________________________________________   INITIAL IMPRESSION / ASSESSMENT AND PLAN / ED COURSE  Pertinent labs &  imaging results that were available during my care of the patient were reviewed by me and considered in my medical decision making (see chart for details).  Patient advised that he will need to stay in the emergency department to be seen by psychiatry. He realizes this may mean that he may need to stay overnight for evaluation. ____________________________________________   FINAL CLINICAL IMPRESSION(S) / ED DIAGNOSES  Acute paranoia. Return visit.    Myrna Blazer, MD 04/10/15 [phone]

## 2015-04-09 NOTE — ED Notes (Signed)
BEHAVIORAL HEALTH ROUNDING Patient sleeping: No   Patient alert and oriented: Yes  Behavior appropriate: No  ; If no, describe: Laughing to self.  States he is remembering childhood times of riding motorcycles with friends. Nutrition and fluids offered: Yes  Toileting and hygiene offered: Yes  Sitter present: Yes   Law enforcement present: Yes

## 2015-04-09 NOTE — BH Assessment (Signed)
Assessment Note  Greg Sanfilippo. is an 49 y.o. male presenting to the ED voluntarily by police for concerns he voiced about the [address] and [place].  Pt did not elaborate on his concerns.  Pt reports he is feeling better since being brought in to the ED.  He denies any SI/HI.  Pt reports no drug/alcohol use.  Pt denies prior hospitalization.  Pt also denies A/V hallucinations.  Axis I: Bipolar, mixed and Schizoaffective Disorder  Past Medical History:  Past Medical History  Diagnosis Date  . Diabetes   . Obesity   . Dyslipidemia   . HTN (hypertension)   . Schizo affective schizophrenia   . Paranoia   . Bipolar 1 disorder     Past Surgical History  Procedure Laterality Date  . Tonsillectomy    . Appendectomy      Family History: No family history on file.  Social History:  reports that he has been smoking Cigarettes.  He has been smoking about 1.00 pack per day. His smokeless tobacco use includes Snuff. He reports that he does not drink alcohol or use illicit drugs.  Additional Social History:  Alcohol / Drug Use History of alcohol / drug use?: No history of alcohol / drug abuse  CIWA: CIWA-Ar BP: (!) 158/90 mmHg Pulse Rate: (!) 109 COWS:    Allergies:  Allergies  Allergen Reactions  . Haldol [Haloperidol] Anaphylaxis  . Risperdal [Risperidone] Other (See Comments)    Reaction: unknown   . Seroquel [Quetiapine Fumarate] Other (See Comments)    Reaction: unknown     Home Medications:  (Not in a hospital admission)  OB/GYN Status:  No LMP for male patient.  General Assessment Data Location of Assessment: [facility] ED TTS Assessment: In system Is this a Tele or Face-to-Face Assessment?: Face-to-Face Is this an Initial Assessment or a Re-assessment for this encounter?: Initial Assessment Marital status: Single Maiden name: N/A Is patient pregnant?: No Pregnancy Status: No Living Arrangements: Alone Can pt return to current living arrangement?: Yes Admission  Status: Voluntary Is patient capable of signing voluntary admission?: Yes Referral Source: Self/Family/Friend Insurance type: Medicare  Medical Screening Exam [facility] Walk-in ONLY) Medical Exam completed: Yes  Crisis Care Plan Living Arrangements: Alone Name of Psychiatrist: Transport planner Name of Therapist: [facility]  Education Status Is patient currently in school?: No Current Grade: N/A Highest grade of school patient has completed: 18 Name of school: N/A Contact person: N/A  Risk to self with the past 6 months Suicidal Ideation: No Has patient been a risk to self within the past 6 months prior to admission? : No Suicidal Intent: No Has patient had any suicidal intent within the past 6 months prior to admission? : No Is patient at risk for suicide?: No Suicidal Plan?: No Has patient had any suicidal plan within the past 6 months prior to admission? : No Access to Means: No What has been your use of drugs/alcohol within the last 12 months?: None reported Previous Attempts/Gestures: No How many times?: 0 Other Self Harm Risks: None reported Triggers for Past Attempts: None known Intentional Self Injurious Behavior: None Family Suicide History: No Recent stressful life event(s): Other (Comment) (None reported) Persecutory voices/beliefs?: No Depression: No Depression Symptoms: Isolating Substance abuse history and/or treatment for substance abuse?: No Suicide prevention information given to non-admitted patients: Not applicable  Risk to Others within the past 6 months Homicidal Ideation: No Does patient have any lifetime risk of violence toward others beyond the six months prior to  admission? : No Thoughts of Harm to Others: No Current Homicidal Intent: No Current Homicidal Plan: No Access to Homicidal Means: No Identified Victim: None reported History of harm to others?: No Assessment of Violence: On admission Violent Behavior Description: None reported Does patient have  access to weapons?: No Criminal Charges Pending?: No Does patient have a court date: No Is patient on probation?: No  Psychosis Hallucinations: None noted Delusions: None noted  Mental Status Report Appearance/Hygiene: In scrubs Eye Contact: Good Motor Activity: Freedom of movement Speech: Logical/coherent Level of Consciousness: Alert Mood: Pleasant Affect: Appropriate to circumstance Anxiety Level: None Thought Processes: Coherent Judgement: Unimpaired Orientation: Person, Place, Time, Situation Obsessive Compulsive Thoughts/Behaviors: Minimal  Cognitive Functioning Concentration: Normal Memory: Recent Intact IQ: Average Insight: Good Impulse Control: Good Appetite: Good Weight Loss: 0 Weight Gain: 0 Sleep: No Change Total Hours of Sleep: 8 Vegetative Symptoms: None  ADLScreening [facility] Assessment Services) Patient's cognitive ability adequate to safely complete daily activities?: Yes Patient able to express need for assistance with ADLs?: Yes Independently performs ADLs?: Yes (appropriate for developmental age)  Prior Inpatient Therapy Prior Inpatient Therapy: No  Prior Outpatient Therapy Prior Outpatient Therapy: No Does patient have an ACCT team?: No Does patient have Intensive In-House Services?  : No Does patient have [facility] services? : No Does patient have P4[facility] services?: No  ADL Screening (condition at time of admission) Patient's cognitive ability adequate to safely complete daily activities?: Yes Patient able to express need for assistance with ADLs?: Yes Independently performs ADLs?: Yes (appropriate for developmental age)       Abuse/Neglect Assessment (Assessment to be complete while patient is alone) Physical Abuse: Denies Verbal Abuse: Denies Sexual Abuse: Denies Exploitation of patient/patient's resources: Denies Self-Neglect: Denies Values / Beliefs Cultural Requests During Hospitalization: None Spiritual Requests During  Hospitalization: None Consults Spiritual Care Consult Needed: No Social Work Consult Needed: No      Additional Information 1:1 In Past 12 Months?: No CIRT Risk: No Elopement Risk: No     Disposition:  Disposition Initial Assessment Completed for this Encounter: Yes Disposition of Patient: Other dispositions Other disposition(s): Other (Comment) (Psych MD consult)  On Site Evaluation by:   Reviewed with Physician:    [place] 04/09/2015 8:13 PM

## 2015-04-10 DIAGNOSIS — F312 Bipolar disorder, current episode manic severe with psychotic features: Secondary | ICD-10-CM | POA: Diagnosis not present

## 2015-04-10 DIAGNOSIS — F22 Delusional disorders: Secondary | ICD-10-CM | POA: Diagnosis not present

## 2015-04-10 MED ORDER — DIPHENHYDRAMINE HCL 25 MG PO CAPS
ORAL_CAPSULE | ORAL | Status: AC
  Administered 2015-04-10: 50 mg via ORAL
  Filled 2015-04-10: qty 2

## 2015-04-10 MED ORDER — DIVALPROEX SODIUM 500 MG PO DR TAB
1000.0000 mg | DELAYED_RELEASE_TABLET | Freq: Two times a day (BID) | ORAL | Status: DC
  Administered 2015-04-10 – 2015-04-11 (×2): 1000 mg via ORAL
  Filled 2015-04-10 (×2): qty 2

## 2015-04-10 MED ORDER — HALOPERIDOL 5 MG PO TABS
ORAL_TABLET | ORAL | Status: AC
  Filled 2015-04-10: qty 1

## 2015-04-10 MED ORDER — DIPHENHYDRAMINE HCL 25 MG PO CAPS
50.0000 mg | ORAL_CAPSULE | Freq: Once | ORAL | Status: AC
  Administered 2015-04-10: 50 mg via ORAL

## 2015-04-10 MED ORDER — LORAZEPAM 2 MG PO TABS
ORAL_TABLET | ORAL | Status: AC
  Administered 2015-04-10: 2 mg via ORAL
  Filled 2015-04-10: qty 1

## 2015-04-10 MED ORDER — LORAZEPAM 2 MG PO TABS
2.0000 mg | ORAL_TABLET | Freq: Once | ORAL | Status: AC
  Administered 2015-04-10: 2 mg via ORAL

## 2015-04-10 MED ORDER — AMANTADINE HCL 100 MG PO CAPS
100.0000 mg | ORAL_CAPSULE | Freq: Two times a day (BID) | ORAL | Status: DC
  Administered 2015-04-10 – 2015-04-11 (×2): 100 mg via ORAL
  Filled 2015-04-10 (×7): qty 1

## 2015-04-10 MED ORDER — PROPRANOLOL HCL 10 MG PO TABS
20.0000 mg | ORAL_TABLET | Freq: Two times a day (BID) | ORAL | Status: DC
  Administered 2015-04-10 – 2015-04-11 (×2): 20 mg via ORAL
  Filled 2015-04-10 (×2): qty 2

## 2015-04-10 MED ORDER — PALIPERIDONE ER 3 MG PO TB24
ORAL_TABLET | ORAL | Status: AC
  Filled 2015-04-10: qty 1

## 2015-04-10 MED ORDER — HALOPERIDOL 5 MG PO TABS
5.0000 mg | ORAL_TABLET | Freq: Once | ORAL | Status: AC
Start: 2015-04-10 — End: 2015-04-10
  Administered 2015-04-10: 5 mg via ORAL

## 2015-04-10 MED ORDER — PALIPERIDONE ER 3 MG PO TB24
3.0000 mg | ORAL_TABLET | Freq: Every day | ORAL | Status: DC
  Administered 2015-04-10 – 2015-04-11 (×2): 3 mg via ORAL
  Filled 2015-04-10: qty 1

## 2015-04-10 NOTE — ED Notes (Signed)
Pt in room. No complaints or concerns voiced at this time. No abnormal behavior noted at this time. Will continue to monitor with q15 min checks and security camera monitoring. ODS officer in area. 

## 2015-04-10 NOTE — ED Notes (Signed)
Meal served. Patient is alert and oriented. He is still exhibiting s/s hypomania but is easily redirectable. Appropriate behaviors with staff and other patients.  Maintained on 15 minute checks and observation via security camera for safety.

## 2015-04-10 NOTE — Consult Note (Signed)
[facility] Consult   Reason for Consult:  Follow up Referring Physician:  Er Patient Identification: Greg [name]. MRN:  [id number] Principal Diagnosis: <principal problem not specified> Diagnosis:   Patient Active Problem List   Diagnosis Date Noted  . Bipolar disorder with psychotic features [F31.9] 02/08/2015  . Suicidal ideation [R45.851] 02/07/2015  . Bipolar 1 disorder with moderate mania [F31.12] 02/07/2015  . Paranoia [F22]   . Bipolar I disorder, most recent episode (or current) manic [F31.10] 11/28/2014  . HTN (hypertension) [I10] 11/28/2014  . Dyslipidemia [E78.5] 11/28/2014  . Obesity [E66.9] 11/28/2014  . Diabetes [E11.9] 11/28/2014  . Tobacco use disorder [Z72.0] 11/28/2014    Total Time spent with patient: 45 minutes  Subjective:   Greg [name]. is a 49 y.o. male patient admitted with a long H/O MI and is on disability and lives with his parents. Pt was brought to ER for exhibiting bizzare behavior ".putting a flag on his back and running around the yard as it was 911 today.  HPI:  Parents were worried as he was agitated and brought him here for help. HPI Elements:     Past Medical History:  Past Medical History  Diagnosis Date  . Diabetes   . Obesity   . Dyslipidemia   . HTN (hypertension)   . Schizo affective schizophrenia   . Paranoia   . Bipolar 1 disorder     Past Surgical History  Procedure Laterality Date  . Tonsillectomy    . Appendectomy     Family History: No family history on file. Social History:  History  Alcohol Use No     History  Drug Use No    Social History   Social History  . Marital Status: Single    Spouse Name: N/A  . Number of Children: N/A  . Years of Education: N/A   Social History Main Topics  . Smoking status: Current Every Day Smoker -- 1.00 packs/day    Types: Cigarettes  . Smokeless tobacco: Current User    Types: Snuff  . Alcohol Use: No  . Drug Use: No  . Sexual  Activity: Not Asked   Other Topics Concern  . None   Social History Narrative   Additional Social History:    History of alcohol / drug use?: No history of alcohol / drug abuse                     Allergies:   Allergies  Allergen Reactions  . Haldol [Haloperidol] Anaphylaxis  . Risperdal [Risperidone] Other (See Comments)    Reaction: unknown   . Seroquel [Quetiapine Fumarate] Other (See Comments)    Reaction: unknown     Labs:  Results for orders placed or performed during the hospital encounter of 04/09/15 (from the past 48 hour(s))  Comprehensive metabolic panel     Status: Abnormal   Collection Time: 04/09/15  4:35 PM  Result Value Ref Range   Sodium 139 135 - 145 mmol/L   Potassium 4.3 3.5 - 5.1 mmol/L   Chloride 103 101 - 111 mmol/L   CO2 26 22 - 32 mmol/L   Glucose, Bld 161 (H) 65 - 99 mg/dL   BUN 25 (H) 6 - 20 mg/dL   Creatinine, Ser 1.17 0.61 - 1.24 mg/dL   Calcium 9.7 8.9 - 10.3 mg/dL   Total Protein 7.9 6.5 - 8.1 g/dL   Albumin 4.3 3.5 - 5.0 g/dL   AST 25 15 -  41 U/L   ALT 20 17 - 63 U/L   Alkaline Phosphatase 45 38 - 126 U/L   Total Bilirubin 0.7 0.3 - 1.2 mg/dL   GFR calc non Af Amer >60 >60 mL/min   GFR calc Af Amer >60 >60 mL/min    Comment: (NOTE) The eGFR has been calculated using the CKD EPI equation. This calculation has not been validated in all clinical situations. eGFR's persistently <60 mL/min signify possible Chronic Kidney Disease.    Anion gap 10 5 - 15  Ethanol (ETOH)     Status: None   Collection Time: 04/09/15  4:35 PM  Result Value Ref Range   Alcohol, Ethyl (B) <5 <5 mg/dL    Comment:        LOWEST DETECTABLE LIMIT FOR SERUM ALCOHOL IS 5 mg/dL FOR MEDICAL PURPOSES ONLY   Salicylate level     Status: None   Collection Time: 04/09/15  4:35 PM  Result Value Ref Range   Salicylate Lvl <7.1 2.8 - 30.0 mg/dL  Acetaminophen level     Status: Abnormal   Collection Time: 04/09/15  4:35 PM  Result Value Ref Range    Acetaminophen (Tylenol), Serum <10 (L) 10 - 30 ug/mL    Comment:        THERAPEUTIC CONCENTRATIONS VARY SIGNIFICANTLY. A RANGE OF 10-30 ug/mL MAY BE AN EFFECTIVE CONCENTRATION FOR MANY PATIENTS. HOWEVER, SOME ARE BEST TREATED AT CONCENTRATIONS OUTSIDE THIS RANGE. ACETAMINOPHEN CONCENTRATIONS >150 ug/mL AT 4 HOURS AFTER INGESTION AND >50 ug/mL AT 12 HOURS AFTER INGESTION ARE OFTEN ASSOCIATED WITH TOXIC REACTIONS.   CBC     Status: None   Collection Time: 04/09/15  4:35 PM  Result Value Ref Range   WBC 9.4 3.8 - 10.6 K/uL   RBC 5.21 4.40 - 5.90 MIL/uL   Hemoglobin 14.5 13.0 - 18.0 g/dL   HCT 44.6 40.0 - 52.0 %   MCV 85.5 80.0 - 100.0 fL   MCH 27.9 26.0 - 34.0 pg   MCHC 32.6 32.0 - 36.0 g/dL   RDW 13.6 11.5 - 14.5 %   Platelets 253 150 - 440 K/uL  Urine Drug Screen, Qualitative ([facility] only)     Status: None   Collection Time: 04/09/15  4:35 PM  Result Value Ref Range   Tricyclic, Ur Screen NONE DETECTED NONE DETECTED   Amphetamines, Ur Screen NONE DETECTED NONE DETECTED   MDMA (Ecstasy)Ur Screen NONE DETECTED NONE DETECTED   Cocaine Metabolite,Ur [place] NONE DETECTED NONE DETECTED   Opiate, Ur Screen NONE DETECTED NONE DETECTED   Phencyclidine (PCP) Ur S NONE DETECTED NONE DETECTED   Cannabinoid 50 Ng, Ur [place] NONE DETECTED NONE DETECTED   Barbiturates, Ur Screen NONE DETECTED NONE DETECTED   Benzodiazepine, Ur Scrn NONE DETECTED NONE DETECTED   Methadone Scn, Ur NONE DETECTED NONE DETECTED    Comment: (NOTE) 245  Tricyclics, urine               Cutoff 1000 ng/mL 200  Amphetamines, urine             Cutoff 1000 ng/mL 300  MDMA (Ecstasy), urine           Cutoff 500 ng/mL 400  Cocaine Metabolite, urine       Cutoff 300 ng/mL 500  Opiate, urine                   Cutoff 300 ng/mL 600  Phencyclidine (PCP), urine      Cutoff 25 ng/mL 700  Cannabinoid, urine              Cutoff 50 ng/mL 800  Barbiturates, urine             Cutoff 200 ng/mL 900  Benzodiazepine, urine           Cutoff  200 ng/mL 1000 Methadone, urine                Cutoff 300 ng/mL 1100 1200 The urine drug screen provides only a preliminary, unconfirmed 1300 analytical test result and should not be used for non-medical 1400 purposes. Clinical consideration and professional judgment should 1500 be applied to any positive drug screen result due to possible 1600 interfering substances. A more specific alternate chemical method 1700 must be used in order to obtain a confirmed analytical result.  1800 Gas chromato graphy / mass spectrometry (GC/MS) is the preferred 1900 confirmatory method.   Valproic acid level     Status: None   Collection Time: 04/09/15  4:35 PM  Result Value Ref Range   Valproic Acid Lvl 99 50.0 - 100.0 ug/mL    Vitals: Blood pressure 168/102, pulse 106, temperature 98.6 F (37 C), temperature source Oral, resp. rate 20, height _0  (1.778 m), weight 246 lb (111.585 kg), SpO2 99 %.  Risk to Self: Suicidal Ideation: No Suicidal Intent: No Is patient at risk for suicide?: No Suicidal Plan?: No Access to Means: No What has been your use of drugs/alcohol within the last 12 months?: None reported How many times?: 0 Other Self Harm Risks: None reported Triggers for Past Attempts: None known Intentional Self Injurious Behavior: None Risk to Others: Homicidal Ideation: No Thoughts of Harm to Others: No Current Homicidal Intent: No Current Homicidal Plan: No Access to Homicidal Means: No Identified Victim: None reported History of harm to others?: No Assessment of Violence: On admission Violent Behavior Description: None reported Does patient have access to weapons?: No Criminal Charges Pending?: No Does patient have a court date: No Prior Inpatient Therapy: Prior Inpatient Therapy: No Prior Outpatient Therapy: Prior Outpatient Therapy: No Does patient have an ACCT team?: No Does patient have Intensive In-House Services?  : No Does patient have [facility] services? : No Does  patient have P4[facility] services?: No  No current facility-administered medications for this encounter.   Current Outpatient Prescriptions  Medication Sig Dispense Refill  . amantadine (SYMMETREL) 100 MG capsule Take 100 mg by mouth 2 (two) times daily.    . divalproex (DEPAKOTE ER) 500 MG 24 hr tablet Take 1 tablet (500 mg total) by mouth 2 (two) times daily. (Patient taking differently: Take 1,000 mg by mouth 2 (two) times daily. ) 60 tablet 0  . paliperidone (INVEGA) 3 MG 24 hr tablet Take 1 tablet (3 mg total) by mouth daily. 30 tablet 0  . lithium carbonate (LITHOBID) 300 MG CR tablet Take 2 tablets (600 mg total) by mouth every 12 (twelve) hours. For mood control 120 tablet 0  . metFORMIN (GLUCOPHAGE) 1000 MG tablet Take 1 tablet (1,000 mg total) by mouth 2 (two) times daily with a meal. 60 tablet 0  . metoprolol succinate (TOPROL-XL) 50 MG 24 hr tablet Take 1 tablet (50 mg total) by mouth daily. Take with or immediately following a meal. 30 tablet 0  . nicotine polacrilex (NICORETTE) 2 MG gum Take 1 each (2 mg total) by mouth as needed for smoking cessation. 100 tablet 0  . simvastatin (ZOCOR) 10 MG tablet Take 1 tablet (10 mg total)  by mouth at bedtime. 30 tablet 0  . traZODone (DESYREL) 50 MG tablet Take 1 tablet (50 mg total) by mouth at bedtime and may repeat dose one time if needed. 60 tablet 0    Musculoskeletal: Strength & Muscle Tone: within normal limits Gait & Station: normal Patient leans: N/A  Psychiatric Specialty Exam: Physical Exam  Nursing note and vitals reviewed.   ROS  Blood pressure 168/102, pulse 106, temperature 98.6 F (37 C), temperature source Oral, resp. rate 20, height _0  (1.778 m), weight 246 lb (111.585 kg), SpO2 99 %.Body mass index is 35.3 kg/(m^2).  General Appearance: Disheveled  Eye Sport and exercise psychologist::  Fair  Speech:  Pressured  Volume:  Normal  Mood:  Anxious, Irritable and and inapprpriate  Affect:  Labile  Thought Process:  Disorganized   Orientation:  Full (Time, Place, and Person)  Thought Content:  Delusions  Suicidal Thoughts:  No  Homicidal Thoughts:  No  Memory:  Immediate;   Fair Recent;   Fair Remote;   Fair poor  Judgement:  Poor  Insight:  Lacking  Psychomotor Activity:  Increased  Concentration:  Fair  Recall:  Poor  Fund of Knowledge:Poor  Language: Fair  Akathisia:  No  Handed:  Right  AIMS (if indicated):     Assets:  Communication Skills Housing Social Support  ADL's:  Intact  Cognition: WNL  Sleep:      Medical Decision Making: Review of Psycho-Social Stressors (1)  Treatment Plan Summary: Plan Inpt when bed is available and medically cleared and stable  Plan:  No evidence of imminent risk to self or others at present.   Disposition: as above.  Camie Patience K 04/10/2015 4:00 PM

## 2015-04-10 NOTE — ED Notes (Signed)
BEHAVIORAL HEALTH ROUNDING Patient sleeping: Yes.   Patient alert and oriented: not applicable Behavior appropriate: Yes.    Nutrition and fluids offered: No Toileting and hygiene offered: No Sitter present: q15 minute observations and security camera monitoring Law enforcement present: Yes [employer] 

## 2015-04-10 NOTE — ED Notes (Signed)
Patient in dayroom. He is alert and oriented. He is exhibiting both paranoid and delusions of grandeur. Speech is tangential.  Maintained on 15 minute checks and observation by security camera for safety.

## 2015-04-10 NOTE — ED Notes (Signed)
Pt in room sleeping at this time. No abnormal behavior noted at this time. Will continue to monitor with q15 min checks and security camera monitoring. [employer] officer in area. 

## 2015-04-10 NOTE — ED Notes (Signed)
Patient still labile and delusional. He did take ordered medications orally without incident. Maintained on 15 minute checks and observation by security camera for safety.

## 2015-04-10 NOTE — ED Notes (Signed)
BEHAVIORAL HEALTH ROUNDING Patient sleeping: Yes.   Patient alert and oriented: not applicable SLEEPING Behavior appropriate: Yes.  ; If no, describe: SLEEPING Nutrition and fluids offered: No SLEEPING Toileting and hygiene offered: NoSLEEPING Sitter present: not applicable Law enforcement present: Yes [employer] 

## 2015-04-10 NOTE — ED Notes (Signed)
Patient in dayroom. No complaints or concerns voiced. No distress or abnormal behavior noted. Will continue to monitor with security cameras. Q 15 minute rounds continue.

## 2015-04-10 NOTE — ED Notes (Signed)
Patient talking with peer in dayroom. No complaints or concerns voiced. No distress or abnormal behavior noted. Will continue to monitor with security cameras. Q 15 minute rounds continue.

## 2015-04-10 NOTE — ED Notes (Signed)
MD at bedside. 

## 2015-04-10 NOTE — ED Notes (Addendum)
ENVIRONMENTAL ASSESSMENT  Potentially harmful objects out of patient reach: Yes.  Personal belongings secured: Yes.  Patient dressed in hospital provided attire only: Yes.  Plastic bags out of patient reach: Yes.  Patient care equipment (cords, cables, call bells, lines, and drains) shortened, removed, or accounted for: Yes.  Equipment and supplies removed from bottom of stretcher: Yes.  Potentially toxic materials out of patient reach: Yes.  Sharps container removed or out of patient reach: Yes.   BEHAVIORAL HEALTH ROUNDING  Patient sleeping: No.  Patient alert and oriented: yes  Behavior appropriate: Yes. ; If no, describe:  Nutrition and fluids offered: Yes  Toileting and hygiene offered: Yes  Sitter present: not applicable  Law enforcement present: Yes [employer]  ED BHU PLACEMENT JUSTIFICATION  Is the patient under IVC or is there intent for IVC: No Is the patient medically cleared: Yes.  Is there vacancy in the ED BHU: Yes.  Is the population mix appropriate for patient: Yes.  Is the patient awaiting placement in inpatient or outpatient setting: Yes.  Has the patient had a psychiatric consult: Yes.  Survey of unit performed for contraband, proper placement and condition of furniture, tampering with fixtures in bathroom, shower, and each patient room: Yes. Findings: All clear   APPEARANCE/BEHAVIOR  calm, cooperative and adequate rapport can be established  NEURO ASSESSMENT  Orientation: time, place and person  Hallucinations: No.None noted (Hallucinations)  Speech: Normal  Gait: normal  RESPIRATORY ASSESSMENT  WNL  CARDIOVASCULAR ASSESSMENT  WNL  GASTROINTESTINAL ASSESSMENT  WNL  EXTREMITIES  WNL  PLAN OF CARE  Provide calm/safe environment. Vital signs assessed twice daily. ED BHU Assessment once each 12-hour shift. Collaborate with intake RN daily or as condition indicates. Assure the ED provider has rounded once each shift. Provide and encourage hygiene. Provide  redirection as needed. Assess for escalating behavior; address immediately and inform ED provider.  Assess family dynamic and appropriateness for visitation as needed: Yes. ; If necessary, describe findings:  Educate the patient/family about BHU procedures/visitation: Yes. ; If necessary, describe findings: Pt is calm and cooperative at this time. Pt understanding and accepting of unit procedures/rules. Will continue to monitor.

## 2015-04-10 NOTE — ED Notes (Signed)
Patient sleeping in no apparent distress. Maintained on 15 minute checks and observation by security camera for safety.

## 2015-04-10 NOTE — ED Notes (Signed)
BEHAVIORAL HEALTH ROUNDING Patient sleeping: No. Patient alert and oriented: yes Behavior appropriate: Yes.   Nutrition and fluids offered: Yes  Toileting and hygiene offered: Yes  Sitter present: q15 min observations and security camera monitoring Law enforcement present: Yes [employer] 

## 2015-04-10 NOTE — ED Notes (Signed)

## 2015-04-10 NOTE — ED Notes (Signed)
Patient became very agitated, stating he was possessed by the devil. He started speaking " in tongues." Yelling, demanding to go to [facility]. He started to strip off scrubs and bang his head against a pillow. He was unable to respond to any reality orientation.  Patient agreed to take medications and he was given Ativan per MD order to help reduce agitation. Will continue to closely monitor.

## 2015-04-10 NOTE — ED Notes (Signed)
Patient taking a shower. No complaints or concerns voiced. No distress or abnormal behavior noted. Will continue to monitor with security cameras. Q 15 minute rounds continue.

## 2015-04-10 NOTE — BHH Counselor (Signed)
Writer spoke with patient to reassessed their current mental state.  Patient states his parents petitioned him to be under IVC. Patient thoughts are currently disorganized and erratic. He is currently pleasant and cooperative. Per records, patient has been diagnosed with of  Schizoaffective.

## 2015-04-10 NOTE — ED Notes (Signed)
Patient sitting in dayroom. No complaints or concerns voiced. No distress or abnormal behavior noted. Will continue to monitor with security cameras. Q 15 minute rounds continue.

## 2015-04-10 NOTE — ED Notes (Signed)
Patient quieter and in good behavioral control. Meal served.

## 2015-04-10 NOTE — ED Notes (Signed)
Pt laying on bed snoring loudly at this time.

## 2015-04-11 ENCOUNTER — Inpatient Hospital Stay
Admit: 2015-04-11 | Discharge: 2015-04-18 | DRG: 885 | Disposition: A | Discharge status: Home / Self Care | Payer: Medicare Other | Source: Intra-hospital | Attending: Psychiatry | Admitting: Psychiatry

## 2015-04-11 ENCOUNTER — Encounter: Payer: Self-pay | Admitting: Psychiatry

## 2015-04-11 DIAGNOSIS — E119 Type 2 diabetes mellitus without complications: Secondary | ICD-10-CM

## 2015-04-11 DIAGNOSIS — F259 Schizoaffective disorder, unspecified: Secondary | ICD-10-CM | POA: Diagnosis not present

## 2015-04-11 DIAGNOSIS — Z818 Family history of other mental and behavioral disorders: Secondary | ICD-10-CM | POA: Diagnosis not present

## 2015-04-11 DIAGNOSIS — I10 Essential (primary) hypertension: Secondary | ICD-10-CM | POA: Diagnosis not present

## 2015-04-11 DIAGNOSIS — F17210 Nicotine dependence, cigarettes, uncomplicated: Secondary | ICD-10-CM | POA: Diagnosis present

## 2015-04-11 DIAGNOSIS — Z9049 Acquired absence of other specified parts of digestive tract: Secondary | ICD-10-CM | POA: Diagnosis present

## 2015-04-11 DIAGNOSIS — E669 Obesity, unspecified: Secondary | ICD-10-CM | POA: Diagnosis present

## 2015-04-11 DIAGNOSIS — E785 Hyperlipidemia, unspecified: Secondary | ICD-10-CM | POA: Diagnosis present

## 2015-04-11 DIAGNOSIS — Z9119 Patient's noncompliance with other medical treatment and regimen: Secondary | ICD-10-CM | POA: Diagnosis present

## 2015-04-11 DIAGNOSIS — Z9889 Other specified postprocedural states: Secondary | ICD-10-CM

## 2015-04-11 DIAGNOSIS — F312 Bipolar disorder, current episode manic severe with psychotic features: Secondary | ICD-10-CM | POA: Diagnosis not present

## 2015-04-11 DIAGNOSIS — Z888 Allergy status to other drugs, medicaments and biological substances status: Secondary | ICD-10-CM | POA: Diagnosis not present

## 2015-04-11 DIAGNOSIS — F22 Delusional disorders: Secondary | ICD-10-CM | POA: Diagnosis present

## 2015-04-11 DIAGNOSIS — G4700 Insomnia, unspecified: Secondary | ICD-10-CM | POA: Diagnosis present

## 2015-04-11 DIAGNOSIS — Z720 Tobacco use: Secondary | ICD-10-CM | POA: Diagnosis not present

## 2015-04-11 DIAGNOSIS — Z79899 Other long term (current) drug therapy: Secondary | ICD-10-CM

## 2015-04-11 DIAGNOSIS — F17200 Nicotine dependence, unspecified, uncomplicated: Secondary | ICD-10-CM | POA: Diagnosis present

## 2015-04-11 LAB — GLUCOSE, CAPILLARY: Glucose-Capillary: 116 mg/dL — ABNORMAL HIGH (ref 65–99)

## 2015-04-11 MED ORDER — ACETAMINOPHEN 325 MG PO TABS
650.0000 mg | ORAL_TABLET | Freq: Four times a day (QID) | ORAL | Status: DC | PRN
  Administered 2015-04-13 – 2015-04-17 (×6): 650 mg via ORAL
  Filled 2015-04-11 (×7): qty 2

## 2015-04-11 MED ORDER — METFORMIN HCL 500 MG PO TABS
1000.0000 mg | ORAL_TABLET | Freq: Two times a day (BID) | ORAL | Status: DC
  Administered 2015-04-11 – 2015-04-18 (×14): 1000 mg via ORAL
  Filled 2015-04-11 (×15): qty 2

## 2015-04-11 MED ORDER — DIVALPROEX SODIUM 500 MG PO DR TAB
500.0000 mg | DELAYED_RELEASE_TABLET | Freq: Two times a day (BID) | ORAL | Status: DC
  Administered 2015-04-11 – 2015-04-18 (×15): 500 mg via ORAL
  Filled 2015-04-11 (×15): qty 1

## 2015-04-11 MED ORDER — TRAZODONE HCL 100 MG PO TABS
100.0000 mg | ORAL_TABLET | Freq: Every day | ORAL | Status: DC
  Administered 2015-04-11 – 2015-04-17 (×7): 100 mg via ORAL
  Filled 2015-04-11 (×7): qty 1

## 2015-04-11 MED ORDER — LITHIUM CARBONATE ER 300 MG PO TBCR
600.0000 mg | EXTENDED_RELEASE_TABLET | Freq: Two times a day (BID) | ORAL | Status: DC
  Administered 2015-04-11 – 2015-04-18 (×15): 600 mg via ORAL
  Filled 2015-04-11 (×16): qty 2

## 2015-04-11 MED ORDER — METOPROLOL SUCCINATE ER 25 MG PO TB24
50.0000 mg | ORAL_TABLET | Freq: Every day | ORAL | Status: DC
  Administered 2015-04-11 – 2015-04-18 (×8): 50 mg via ORAL
  Filled 2015-04-11 (×8): qty 2

## 2015-04-11 MED ORDER — ALUM & MAG HYDROXIDE-SIMETH 200-200-20 MG/5ML PO SUSP
30.0000 mL | ORAL | Status: DC | PRN
  Administered 2015-04-18: 30 mL via ORAL
  Filled 2015-04-11: qty 30

## 2015-04-11 MED ORDER — SIMVASTATIN 20 MG PO TABS
10.0000 mg | ORAL_TABLET | Freq: Every day | ORAL | Status: DC
  Administered 2015-04-11 – 2015-04-17 (×7): 10 mg via ORAL
  Filled 2015-04-11: qty 1
  Filled 2015-04-11 (×2): qty 2
  Filled 2015-04-11 (×4): qty 1
  Filled 2015-04-11: qty 2

## 2015-04-11 MED ORDER — MAGNESIUM HYDROXIDE 400 MG/5ML PO SUSP: 30.0000 mL | Freq: Every day | ORAL | Status: DC | PRN

## 2015-04-11 MED ORDER — PALIPERIDONE ER 3 MG PO TB24
6.0000 mg | ORAL_TABLET | Freq: Every day | ORAL | Status: DC
Start: 2015-04-11 — End: 2015-04-18
  Administered 2015-04-11 – 2015-04-18 (×8): 6 mg via ORAL
  Filled 2015-04-11 (×8): qty 2

## 2015-04-11 MED ORDER — NICOTINE 21 MG/24HR TD PT24
21.0000 mg | MEDICATED_PATCH | Freq: Every day | TRANSDERMAL | Status: DC
  Administered 2015-04-11 – 2015-04-17 (×7): 21 mg via TRANSDERMAL
  Filled 2015-04-11 (×8): qty 1

## 2015-04-11 NOTE — ED Notes (Signed)
BEHAVIORAL HEALTH ROUNDING Patient sleeping: Yes.   Patient alert and oriented: not applicable SLEEPING Behavior appropriate: Yes.  ; If no, describe: SLEEPING Nutrition and fluids offered: No SLEEPING Toileting and hygiene offered: NoSLEEPING Sitter present: not applicable Law enforcement present: Yes [employer] 

## 2015-04-11 NOTE — [facility] Group Notes (Signed)
[facility] Group Notes:  (Nursing/MHT/Case Management/Adjunct)  Date:  04/11/2015  Time:  9:33 PM  Type of Therapy:  Group Therapy  Participation Level:  Active  Participation Quality:  Appropriate  Affect:  Appropriate  Cognitive:  Appropriate  Insight:  Good  Engagement in Group:  Engaged  Modes of Intervention:  Discussion  Summary of Progress/Problems:  Greg [name] 04/11/2015, 9:33 PM

## 2015-04-11 NOTE — BHH Counselor (Signed)
Pt. is to be admitted to [facility] by Dr. Guss Bunde. Attending Physician will be Dr. Jennet Maduro.  Pt. has been assigned to room 312, by [facility] Charge Nurse Jerry Caras.  Intake Paper Work has been signed and placed on pt. chart. ER staff Misty Stanley, ER Sect.; Dr. Jannet Mantis, ER MD; Huel Cote Patient's Nurse & Clotilde Dieter L. Patient Access) have been made aware of the admission.

## 2015-04-11 NOTE — ED Notes (Signed)
Pt incontinent  of urine. Given clean scrubs and up to bathroom to change clothes. Pt returned to bed.

## 2015-04-11 NOTE — ED Notes (Signed)
BEHAVIORAL HEALTH ROUNDING Patient sleeping: No. Patient alert and oriented: yes Behavior appropriate: Yes.  ; If no, describe:  Nutrition and fluids offered: Yes  Toileting and hygiene offered: Yes  Sitter present: no Law enforcement present: Yes  

## 2015-04-11 NOTE — H&P (Signed)
Psychiatric Admission Assessment Adult  Patient Identification: Greg [name]. MRN:  [id number] Date of Evaluation:  04/11/2015 Chief Complaint:  Schizoaffective Principal Diagnosis: Bipolar affective disorder, currently manic, severe, with psychotic features Diagnosis:   Patient Active Problem List   Diagnosis Date Noted  . Schizo-affective psychosis [F25.9] 04/11/2015  . Bipolar affective disorder, manic, severe, with psychotic behavior [F31.2] 04/11/2015  . Suicidal ideation [R45.851] 02/07/2015  . Paranoia [F22]   . Bipolar affective disorder, currently manic, severe, with psychotic features [F31.2] 11/28/2014  . HTN (hypertension) [I10] 11/28/2014  . Dyslipidemia [E78.5] 11/28/2014  . Obesity [E66.9] 11/28/2014  . Diabetes [E11.9] 11/28/2014  . Tobacco use disorder [Z72.0] 11/28/2014   History of Present Illness::   Identifying data. [name] is a 49 year old male with a history of bipolar disorder.   Chief complaint. I'm fine.  History of present il [name] has a long history of bipolar illness with multiple hospital admissions for manic episodes. He is last hospitalization was at [facility] in July of this year. He usually does well on a combination of lithium, Depakote, and the [place]. He however refuses injectable anti-psychotics. He was petitioned by his parents for disorganized, paranoid, bizarre behavior. He was wearing American flag and was preoccupied with conspiracy theory about 9/11. The patient was insomniac, restlessness, agitated, with racing thoughts, and paranoid delusions. It is unclear if he were compliant with medications. His Depakote level was therapeutic, Lithium level was very low. The patient denies symptoms of depression, anxiety, or psychosis. He denies alcohol or illicit substance use.  Past psychiatric history. There were multiple hospital admissions and multiple medication trials. There were no suicide attempts.  Family psychiatric  history. Grandmother with schizophrenia.  Social history. He dropped out of high school but got his GED's and went to technical college. He is divorced. He is disabled from mental illness. He lives with his parents.  Total Time spent with patient: 1 hour  Past Medical History:  Past Medical History  Diagnosis Date  . Diabetes   . Obesity   . Dyslipidemia   . HTN (hypertension)   . Schizo affective schizophrenia   . Paranoia   . Bipolar 1 disorder     Past Surgical History  Procedure Laterality Date  . Tonsillectomy    . Appendectomy     Family History: History reviewed. No pertinent family history. Social History:  History  Alcohol Use No     History  Drug Use No    Social History   Social History  . Marital Status: Single    Spouse Name: N/A  . Number of Children: N/A  . Years of Education: N/A   Social History Main Topics  . Smoking status: Current Every Day Smoker -- 1.00 packs/day    Types: Cigarettes  . Smokeless tobacco: Current User    Types: Snuff  . Alcohol Use: No  . Drug Use: No  . Sexual Activity: Not Asked   Other Topics Concern  . None   Social History Narrative   Additional Social History:                          Musculoskeletal: Strength & Muscle Tone: within normal limits Gait & Station: normal Patient leans: N/A  Psychiatric Specialty Exam: Physical Exam  Nursing note and vitals reviewed.   Review of Systems  All other systems reviewed and are negative.   There were no vitals taken for this visit.There is no  weight on file to calculate BMI.  See SRA.                                                  Sleep:      Risk to Self:   Risk to Others:   Prior Inpatient Therapy:   Prior Outpatient Therapy:    Alcohol Screening:    Allergies:   Allergies  Allergen Reactions  . Haldol [Haloperidol] Anaphylaxis  . Risperdal [Risperidone] Other (See Comments)    Reaction: unknown   . Seroquel  [Quetiapine Fumarate] Other (See Comments)    Reaction: unknown    Lab Results:  Results for orders placed or performed during the hospital encounter of 04/09/15 (from the past 48 hour(s))  Comprehensive metabolic panel     Status: Abnormal   Collection Time: 04/09/15  4:35 PM  Result Value Ref Range   Sodium 139 135 - 145 mmol/L   Potassium 4.3 3.5 - 5.1 mmol/L   Chloride 103 101 - 111 mmol/L   CO2 26 22 - 32 mmol/L   Glucose, Bld 161 (H) 65 - 99 mg/dL   BUN 25 (H) 6 - 20 mg/dL   Creatinine, Ser 1.17 0.61 - 1.24 mg/dL   Calcium 9.7 8.9 - 10.3 mg/dL   Total Protein 7.9 6.5 - 8.1 g/dL   Albumin 4.3 3.5 - 5.0 g/dL   AST 25 15 - 41 U/L   ALT 20 17 - 63 U/L   Alkaline Phosphatase 45 38 - 126 U/L   Total Bilirubin 0.7 0.3 - 1.2 mg/dL   GFR calc non Af Amer >60 >60 mL/min   GFR calc Af Amer >60 >60 mL/min    Comment: (NOTE) The eGFR has been calculated using the CKD EPI equation. This calculation has not been validated in all clinical situations. eGFR's persistently <60 mL/min signify possible Chronic Kidney Disease.    Anion gap 10 5 - 15  Ethanol (ETOH)     Status: None   Collection Time: 04/09/15  4:35 PM  Result Value Ref Range   Alcohol, Ethyl (B) <5 <5 mg/dL    Comment:        LOWEST DETECTABLE LIMIT FOR SERUM ALCOHOL IS 5 mg/dL FOR MEDICAL PURPOSES ONLY   Salicylate level     Status: None   Collection Time: 04/09/15  4:35 PM  Result Value Ref Range   Salicylate Lvl <0.3 2.8 - 30.0 mg/dL  Acetaminophen level     Status: Abnormal   Collection Time: 04/09/15  4:35 PM  Result Value Ref Range   Acetaminophen (Tylenol), Serum <10 (L) 10 - 30 ug/mL    Comment:        THERAPEUTIC CONCENTRATIONS VARY SIGNIFICANTLY. A RANGE OF 10-30 ug/mL MAY BE AN EFFECTIVE CONCENTRATION FOR MANY PATIENTS. HOWEVER, SOME ARE BEST TREATED AT CONCENTRATIONS OUTSIDE THIS RANGE. ACETAMINOPHEN CONCENTRATIONS >150 ug/mL AT 4 HOURS AFTER INGESTION AND >50 ug/mL AT 12 HOURS AFTER INGESTION  ARE OFTEN ASSOCIATED WITH TOXIC REACTIONS.   CBC     Status: None   Collection Time: 04/09/15  4:35 PM  Result Value Ref Range   WBC 9.4 3.8 - 10.6 K/uL   RBC 5.21 4.40 - 5.90 MIL/uL   Hemoglobin 14.5 13.0 - 18.0 g/dL   HCT 44.6 40.0 - 52.0 %   MCV 85.5 80.0 - 100.0 fL  MCH 27.9 26.0 - 34.0 pg   MCHC 32.6 32.0 - 36.0 g/dL   RDW 13.6 11.5 - 14.5 %   Platelets 253 150 - 440 K/uL  Urine Drug Screen, Qualitative ([facility] only)     Status: None   Collection Time: 04/09/15  4:35 PM  Result Value Ref Range   Tricyclic, Ur Screen NONE DETECTED NONE DETECTED   Amphetamines, Ur Screen NONE DETECTED NONE DETECTED   MDMA (Ecstasy)Ur Screen NONE DETECTED NONE DETECTED   Cocaine Metabolite,Ur [place] NONE DETECTED NONE DETECTED   Opiate, Ur Screen NONE DETECTED NONE DETECTED   Phencyclidine (PCP) Ur S NONE DETECTED NONE DETECTED   Cannabinoid 50 Ng, Ur [place] NONE DETECTED NONE DETECTED   Barbiturates, Ur Screen NONE DETECTED NONE DETECTED   Benzodiazepine, Ur Scrn NONE DETECTED NONE DETECTED   Methadone Scn, Ur NONE DETECTED NONE DETECTED    Comment: (NOTE) 176  Tricyclics, urine               Cutoff 1000 ng/mL 200  Amphetamines, urine             Cutoff 1000 ng/mL 300  MDMA (Ecstasy), urine           Cutoff 500 ng/mL 400  Cocaine Metabolite, urine       Cutoff 300 ng/mL 500  Opiate, urine                   Cutoff 300 ng/mL 600  Phencyclidine (PCP), urine      Cutoff 25 ng/mL 700  Cannabinoid, urine              Cutoff 50 ng/mL 800  Barbiturates, urine             Cutoff 200 ng/mL 900  Benzodiazepine, urine           Cutoff 200 ng/mL 1000 Methadone, urine                Cutoff 300 ng/mL 1100 1200 The urine drug screen provides only a preliminary, unconfirmed 1300 analytical test result and should not be used for non-medical 1400 purposes. Clinical consideration and professional judgment should 1500 be applied to any positive drug screen result due to possible 1600 interfering substances. A more  specific alternate chemical method 1700 must be used in order to obtain a confirmed analytical result.  1800 Gas chromato graphy / mass spectrometry (GC/MS) is the preferred 1900 confirmatory method.   Valproic acid level     Status: None   Collection Time: 04/09/15  4:35 PM  Result Value Ref Range   Valproic Acid Lvl 99 50.0 - 100.0 ug/mL   Current Medications: Current Facility-Administered Medications  Medication Dose Route Frequency Provider Last Rate Last Dose  . acetaminophen (TYLENOL) tablet 650 mg  650 mg Oral Q6H PRN Dewain Penning, MD      . alum & mag hydroxide-simeth (MAALOX/MYLANTA) 200-200-20 MG/5ML suspension 30 mL  30 mL Oral Q4H PRN Dewain Penning, MD      . divalproex (DEPAKOTE) DR tablet 500 mg  500 mg Oral Q12H [name], MD      . lithium carbonate (LITHOBID) CR tablet 600 mg  600 mg Oral Q12H [name], MD      . magnesium hydroxide (MILK OF MAGNESIA) suspension 30 mL  30 mL Oral Daily PRN Dewain Penning, MD      . metFORMIN (GLUCOPHAGE) tablet 1,000 mg  1,000 mg Oral BID WC Jolanta B  Pucilowska, MD      . metoprolol succinate (TOPROL-XL) 24 hr tablet 50 mg  50 mg Oral Daily [name], MD      . nicotine (NICODERM CQ - dosed in mg/24 hours) patch 21 mg  21 mg Transdermal Daily [name], MD      . paliperidone (INVEGA) 24 hr tablet 6 mg  6 mg Oral Daily [name], MD      . simvastatin (ZOCOR) tablet 10 mg  10 mg Oral q1800 [name], MD      . traZODone (DESYREL) tablet 100 mg  100 mg Oral QHS [name], MD       PTA Medications: Prescriptions prior to admission  Medication Sig Dispense Refill Last Dose  . amantadine (SYMMETREL) 100 MG capsule Take 100 mg by mouth 2 (two) times daily.     . divalproex (DEPAKOTE ER) 500 MG 24 hr tablet Take 1 tablet (500 mg total) by mouth 2 (two) times daily. (Patient taking differently: Take 1,000 mg by mouth 2 (two) times daily. ) 60 tablet 0   .  lithium carbonate (LITHOBID) 300 MG CR tablet Take 2 tablets (600 mg total) by mouth every 12 (twelve) hours. For mood control 120 tablet 0   . metFORMIN (GLUCOPHAGE) 1000 MG tablet Take 1 tablet (1,000 mg total) by mouth 2 (two) times daily with a meal. 60 tablet 0   . metoprolol succinate (TOPROL-XL) 50 MG 24 hr tablet Take 1 tablet (50 mg total) by mouth daily. Take with or immediately following a meal. 30 tablet 0   . nicotine polacrilex (NICORETTE) 2 MG gum Take 1 each (2 mg total) by mouth as needed for smoking cessation. 100 tablet 0   . paliperidone (INVEGA) 3 MG 24 hr tablet Take 1 tablet (3 mg total) by mouth daily. 30 tablet 0   . simvastatin (ZOCOR) 10 MG tablet Take 1 tablet (10 mg total) by mouth at bedtime. 30 tablet 0   . traZODone (DESYREL) 50 MG tablet Take 1 tablet (50 mg total) by mouth at bedtime and may repeat dose one time if needed. 60 tablet 0     Previous Psychotropic Medications: Yes   Substance Abuse History in the last 12 months:  No.    Consequences of Substance Abuse: NA  Results for orders placed or performed during the hospital encounter of 04/09/15 (from the past 72 hour(s))  Comprehensive metabolic panel     Status: Abnormal   Collection Time: 04/09/15  4:35 PM  Result Value Ref Range   Sodium 139 135 - 145 mmol/L   Potassium 4.3 3.5 - 5.1 mmol/L   Chloride 103 101 - 111 mmol/L   CO2 26 22 - 32 mmol/L   Glucose, Bld 161 (H) 65 - 99 mg/dL   BUN 25 (H) 6 - 20 mg/dL   Creatinine, Ser 1.17 0.61 - 1.24 mg/dL   Calcium 9.7 8.9 - 10.3 mg/dL   Total Protein 7.9 6.5 - 8.1 g/dL   Albumin 4.3 3.5 - 5.0 g/dL   AST 25 15 - 41 U/L   ALT 20 17 - 63 U/L   Alkaline Phosphatase 45 38 - 126 U/L   Total Bilirubin 0.7 0.3 - 1.2 mg/dL   GFR calc non Af Amer >60 >60 mL/min   GFR calc Af Amer >60 >60 mL/min    Comment: (NOTE) The eGFR has been calculated using the CKD EPI equation. This calculation has not been validated in  all clinical situations. eGFR's  persistently <60 mL/min signify possible Chronic Kidney Disease.    Anion gap 10 5 - 15  Ethanol (ETOH)     Status: None   Collection Time: 04/09/15  4:35 PM  Result Value Ref Range   Alcohol, Ethyl (B) <5 <5 mg/dL    Comment:        LOWEST DETECTABLE LIMIT FOR SERUM ALCOHOL IS 5 mg/dL FOR MEDICAL PURPOSES ONLY   Salicylate level     Status: None   Collection Time: 04/09/15  4:35 PM  Result Value Ref Range   Salicylate Lvl <4.8 2.8 - 30.0 mg/dL  Acetaminophen level     Status: Abnormal   Collection Time: 04/09/15  4:35 PM  Result Value Ref Range   Acetaminophen (Tylenol), Serum <10 (L) 10 - 30 ug/mL    Comment:        THERAPEUTIC CONCENTRATIONS VARY SIGNIFICANTLY. A RANGE OF 10-30 ug/mL MAY BE AN EFFECTIVE CONCENTRATION FOR MANY PATIENTS. HOWEVER, SOME ARE BEST TREATED AT CONCENTRATIONS OUTSIDE THIS RANGE. ACETAMINOPHEN CONCENTRATIONS >150 ug/mL AT 4 HOURS AFTER INGESTION AND >50 ug/mL AT 12 HOURS AFTER INGESTION ARE OFTEN ASSOCIATED WITH TOXIC REACTIONS.   CBC     Status: None   Collection Time: 04/09/15  4:35 PM  Result Value Ref Range   WBC 9.4 3.8 - 10.6 K/uL   RBC 5.21 4.40 - 5.90 MIL/uL   Hemoglobin 14.5 13.0 - 18.0 g/dL   HCT 44.6 40.0 - 52.0 %   MCV 85.5 80.0 - 100.0 fL   MCH 27.9 26.0 - 34.0 pg   MCHC 32.6 32.0 - 36.0 g/dL   RDW 13.6 11.5 - 14.5 %   Platelets 253 150 - 440 K/uL  Urine Drug Screen, Qualitative ([facility] only)     Status: None   Collection Time: 04/09/15  4:35 PM  Result Value Ref Range   Tricyclic, Ur Screen NONE DETECTED NONE DETECTED   Amphetamines, Ur Screen NONE DETECTED NONE DETECTED   MDMA (Ecstasy)Ur Screen NONE DETECTED NONE DETECTED   Cocaine Metabolite,Ur [place] NONE DETECTED NONE DETECTED   Opiate, Ur Screen NONE DETECTED NONE DETECTED   Phencyclidine (PCP) Ur S NONE DETECTED NONE DETECTED   Cannabinoid 50 Ng, Ur [place] NONE DETECTED NONE DETECTED   Barbiturates, Ur Screen NONE DETECTED NONE DETECTED   Benzodiazepine, Ur Scrn NONE  DETECTED NONE DETECTED   Methadone Scn, Ur NONE DETECTED NONE DETECTED    Comment: (NOTE) 016  Tricyclics, urine               Cutoff 1000 ng/mL 200  Amphetamines, urine             Cutoff 1000 ng/mL 300  MDMA (Ecstasy), urine           Cutoff 500 ng/mL 400  Cocaine Metabolite, urine       Cutoff 300 ng/mL 500  Opiate, urine                   Cutoff 300 ng/mL 600  Phencyclidine (PCP), urine      Cutoff 25 ng/mL 700  Cannabinoid, urine              Cutoff 50 ng/mL 800  Barbiturates, urine             Cutoff 200 ng/mL 900  Benzodiazepine, urine           Cutoff 200 ng/mL 1000 Methadone, urine  Cutoff 300 ng/mL 1100 1200 The urine drug screen provides only a preliminary, unconfirmed 1300 analytical test result and should not be used for non-medical 1400 purposes. Clinical consideration and professional judgment should 1500 be applied to any positive drug screen result due to possible 1600 interfering substances. A more specific alternate chemical method 1700 must be used in order to obtain a confirmed analytical result.  1800 Gas chromato graphy / mass spectrometry (GC/MS) is the preferred 1900 confirmatory method.   Valproic acid level     Status: None   Collection Time: 04/09/15  4:35 PM  Result Value Ref Range   Valproic Acid Lvl 99 50.0 - 100.0 ug/mL    Observation Level/Precautions:  15 minute checks  Laboratory:  CBC Chemistry Profile UDS UA  Psychotherapy:    Medications:    Consultations:    Discharge Concerns:    Estimated LOS:  Other:     Psychological Evaluations: No   Treatment Plan Summary: Daily contact with patient to assess and evaluate symptoms and progress in treatment and Medication management  Medical Decision Making:  New problem, with additional work up planned, Review of Psycho-Social Stressors (1), Review or order clinical lab tests (1), Review of Medication Regimen & Side Effects (2) and Review of New Medication or Change in Dosage  (2)   Greg [name] is a 49 year old male with a history of severe bipolar illness admitted in a manic psychotic episode with low lithium level.  1. Mood and psychosis. We will continue Depakote. Valproic acid level on admission was therapeutic. We will restart lithium. Lithium level on admission was low. We will continue Invega. The patient on multiple occasions was encouraged to take injectable psychotics but he refuses.  2. Diabetes. We'll continue metformin.  3. Hypertension. We will continue Toprol.  4. Dyslipidemia. We'll continue Zocor.   5. Smoking. Nicotine patch is available.  6 insomnia. We'll continue trazodone.  7. Disposition. He will return to home with his parents. He will follow up with his psychiatrist.  I certify that inpatient services furnished can reasonably be expected to improve the patient's condition.   Jolanta Pucilowska 9/12/20162:29 PM

## 2015-04-11 NOTE — ED Provider Notes (Signed)
-----------------------------------------   6:44 AM on 04/11/2015 -----------------------------------------   Blood pressure 140/97, pulse 112, temperature 98.2 F (36.8 C), temperature source Oral, resp. rate 20, height  (1.778 m), weight 246 lb (111.585 kg), SpO2 99 %.  The patient had no acute events since last update.  Calm and cooperative at this time.  The patient will be admitted to the psych service.     Rebecka Apley, MD 04/11/15 [phone]

## 2015-04-11 NOTE — ED Notes (Signed)
BEHAVIORAL HEALTH ROUNDING Patient sleeping: Yes.   Patient alert and oriented: not applicable Behavior appropriate: Yes.  ; If no, describe:  Nutrition and fluids offered: Yes  Toileting and hygiene offered: Yes  Sitter present: no Law enforcement present: Yes  

## 2015-04-11 NOTE — ED Notes (Signed)

## 2015-04-11 NOTE — Tx Team (Signed)
Initial Interdisciplinary Treatment Plan   PATIENT STRESSORS: Medication change or noncompliance Traumatic event   PATIENT STRENGTHS: Average or above average intelligence Communication skills Supportive family/friends   PROBLEM LIST: Problem List/Patient Goals Date to be addressed Date deferred Reason deferred Estimated date of resolution  Suicidal ideation 04/11/2015     Bipolar with moderate mania. 04/11/2015                                                DISCHARGE CRITERIA:  Ability to meet basic life and health needs Adequate post-discharge living arrangements Improved stabilization in mood, thinking, and/or behavior  PRELIMINARY DISCHARGE PLAN: Attend PHP/IOP Return to previous living arrangement  PATIENT/FAMIILY INVOLVEMENT: This treatment plan has been presented to and reviewed with the patient, Greg Heller., and/or family member,   The patient and family have been given the opportunity to ask questions and make suggestions.  Margo Common Greg [name] 04/11/2015, 3:38 PM

## 2015-04-11 NOTE — Progress Notes (Signed)
Recreation Therapy Notes  Date: 09.12.16 Time: 3:00 pm Location: Craft Room  Group Topic: Self-expression  Goal Area(s) Addresses:  Patient will identify one color per emotion listed on wheel. Patient will verbalize benefit of using art as a means of self-expression. Patient will verbalize one emotion experienced during session. Patient will be educated on other forms of self-expression.  Behavioral Response: Did not attend  Intervention: Emotion Wheel  Activity: Patients were given a worksheet with 7 different emotions and were instructed to pick a color for each emotion.  Education: LRT educated patient on different forms of self-expression.   Education Outcome: Patient did not attend group.  Clinical Observations/Feedback: Patient did not attend group.  Jacquelynn Cree, LRT/CTRS 04/11/2015 4:34 PM

## 2015-04-11 NOTE — Progress Notes (Signed)
49yr old male admitted with schizoaffective disorder.He had a history of schizophrenia & bipolar disorder.He was pleasant & cooperative during admission.Denies suicidal & homicidal ideation and hallucination.No contraband found on admission.

## 2015-04-11 NOTE — [facility] Suicide Risk Assessment (Signed)
[facility] Admission Suicide Risk Assessment   Nursing information obtained from:    Demographic factors:    Current Mental Status:    Loss Factors:    Historical Factors:    Risk Reduction Factors:    Total Time spent with patient: 1 hour Principal Problem: Bipolar affective disorder, currently manic, severe, with psychotic features Diagnosis:   Patient Active Problem List   Diagnosis Date Noted  . Suicidal ideation [R45.851] 02/07/2015  . Paranoia [F22]   . Bipolar affective disorder, currently manic, severe, with psychotic features [F31.2] 11/28/2014  . HTN (hypertension) [I10] 11/28/2014  . Dyslipidemia [E78.5] 11/28/2014  . Obesity [E66.9] 11/28/2014  . Diabetes [E11.9] 11/28/2014  . Tobacco use disorder [Z72.0] 11/28/2014     Continued Clinical Symptoms:    The "Alcohol Use Disorders Identification Test", Guidelines for Use in Primary Care, Second Edition.  World Science writer [facility]). Score between 0-7:  no or low risk or alcohol related problems. Score between 8-15:  moderate risk of alcohol related problems. Score between 16-19:  high risk of alcohol related problems. Score 20 or above:  warrants further diagnostic evaluation for alcohol dependence and treatment.   CLINICAL FACTORS:   Severe Anxiety and/or Agitation Bipolar Disorder:   Mixed State   Musculoskeletal: Strength & Muscle Tone: within normal limits Gait & Station: normal Patient leans: N/A  Psychiatric Specialty Exam: Physical Exam  Vitals reviewed. Constitutional: He is oriented to person, place, and time. He appears well-developed and well-nourished.  HENT:  Head: Normocephalic and atraumatic.  Eyes: Conjunctivae and EOM are normal. Pupils are equal, round, and reactive to light.  Neck: Normal range of motion. Neck supple.  Cardiovascular: Normal rate, regular rhythm and normal heart sounds.   Respiratory: Effort normal and breath sounds normal.  GI: Soft. Bowel sounds are normal.   Musculoskeletal: Normal range of motion.  Neurological: He is alert and oriented to person, place, and time.  Skin: Skin is warm and dry.    Review of Systems  All other systems reviewed and are negative.   There were no vitals taken for this visit.There is no weight on file to calculate BMI.  General Appearance: Disheveled  Eye Solicitor::  Fair  Speech:  Pressured  Volume:  Normal  Mood:  Dysphoric and Irritable  Affect:  Labile  Thought Process:  Goal Directed  Orientation:  Full (Time, Place, and Person)  Thought Content:  Delusions and Paranoid Ideation  Suicidal Thoughts:  No  Homicidal Thoughts:  No  Memory:  Immediate;   Fair Recent;   Fair Remote;   Fair  Judgement:  Poor  Insight:  Lacking  Psychomotor Activity:  Increased  Concentration:  Fair  Recall:  [employer] of Knowledge:Fair  Language: Fair  Akathisia:  No  Handed:  Right  AIMS (if indicated):     Assets:  Communication Skills Desire for Improvement Financial Resources/Insurance Housing Physical Health Resilience Social Support  Sleep:     Cognition: WNL  ADL's:  Intact     COGNITIVE FEATURES THAT CONTRIBUTE TO RISK:  None    SUICIDE RISK:   Moderate:  Frequent suicidal ideation with limited intensity, and duration, some specificity in terms of plans, no associated intent, good self-control, limited dysphoria/symptomatology, some risk factors present, and identifiable protective factors, including available and accessible social support.  PLAN OF CARE: Hospital admission, medication management, discharge planning.  Medical Decision Making:  New problem, with additional work up planned, Review of Psycho-Social Stressors (1), Review or order clinical  lab tests (1), Review of Medication Regimen & Side Effects (2) and Review of New Medication or Change in Dosage (2)   Mr. Osmun is a 49 year old male with a history of severe bipolar illness admitted in a manic psychotic episode with low lithium  level.  1. Mood and psychosis. We will continue Depakote. Valproic acid level on admission was therapeutic. We will restart lithium. Lithium level on admission was low. We will continue Invega. The patient on multiple occasions was encouraged to take injectable psychotics but he refuses.  2. Diabetes. We'll continue metformin.  3. Hypertension. We will continue Toprol.  4. Dyslipidemia. We'll continue Zocor.   5. Smoking. Nicotine patch is available.  6 insomnia. We'll continue trazodone.  7. Disposition. He will return to home with his parents. He will follow up with his psychiatrist.     I certify that inpatient services furnished can reasonably be expected to improve the patient's condition.   Sibley Rolison 04/11/2015, 2:13 PM

## 2015-04-12 LAB — GLUCOSE, CAPILLARY
Glucose-Capillary: 129 mg/dL — ABNORMAL HIGH (ref 65–99)
Glucose-Capillary: 120 mg/dL — ABNORMAL HIGH (ref 65–99)

## 2015-04-12 MED ORDER — PALIPERIDONE PALMITATE 234 MG/1.5ML IM SUSP
234.0000 mg | Freq: Once | INTRAMUSCULAR | Status: AC
  Administered 2015-04-12: 234 mg via INTRAMUSCULAR
  Filled 2015-04-12: qty 1.5

## 2015-04-12 NOTE — Plan of Care (Signed)
Problem: Ineffective individual coping Goal: STG: Patient will remain free from self harm Outcome: Progressing Medications administered as ordered by the physician, no PRN given, 15 minute checks maintained for safety, received scrubs per request, clinical and moral support provided, patient encouraged to continue to express feelings and demonstrate safe care. Patient remain free from harm, will continue to monitor.

## 2015-04-12 NOTE — Tx Team (Signed)
Interdisciplinary Treatment Plan Update (Adult)  Date:  04/12/2015 Time Reviewed:  1:55 PM  Progress in Treatment: Attending groups: Yes. Participating in groups:  Yes. Taking medication as prescribed:  Yes. Tolerating medication:  Yes. Family/Significant othe contact made:  No, will contact:  mother Greg [name] Patient understands diagnosis:  Yes. Discussing patient identified problems/goals with staff:  Yes. Medical problems stabilized or resolved:  Yes. Denies suicidal/homicidal ideation: Yes. Issues/concerns per patient self-inventory:  No. Other:  New problem(s) identified: No, Describe:  none reported  Discharge Plan or Barriers: Patient will return home with his parents and need hospital followup at discharge  Reason for Continuation of Hospitalization: Delusions  Mania  Comments:  Estimated length of stay: up to 4 days with expected discharge Friday 04/15/15  New goal(s):  Review of initial/current patient goals per problem list:   See Care Plan  Attendees: Physician:  Kristine Linea, MD 9/13/20161:55 PM  Nursing:   Nile Riggs, RN 9/13/20161:55 PM  Other:  Beryl Meager, LCSWA 9/13/20161:55 PM   Scribe for Treatment Team:   Lulu Riding, MSW, LCSWA  04/12/2015, 1:55 PM

## 2015-04-12 NOTE — [facility] Group Notes (Signed)
[facility] LCSW Group Therapy  04/12/2015 2:10 PM  Type of Therapy:  Group Therapy  Participation Level:  Active  Participation Quality:  Appropriate and Attentive  Affect:  Appropriate  Cognitive:  Alert, Appropriate and Disorganized  Insight:  Improving  Engagement in Therapy:  Engaged  Modes of Intervention:  Discussion, Socialization and Support  Summary of Progress/Problems: Patient attended and partcipated appropriately in group however was a little disorganized. Patient shared emotions he has struggled with to include mania or hyperacitivity and dperession as well as fear.   Lulu Riding, MSW, LCSWA 04/12/2015, 2:10 PM

## 2015-04-12 NOTE — Progress Notes (Signed)
Recreation Therapy Notes  INPATIENT RECREATION THERAPY ASSESSMENT  Patient Details Name: Greg [name]. 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

## 2015-04-12 NOTE — Progress Notes (Signed)
Patient ID: Greg [name]., male   DOB: 1965/10/30, 49 y.o.   MRN: [id number] Pt reports being in a good mood, slept well last night, high energy level, no pain. Pt c/o dry peeling hands but otherwise no problems. His goal is to attend groups, which he is doing. He states that this is a "tight ship." He does not see any barriers to his discharge plan. RN offered therapeutic listening, pt stated he had nothing to report other than his dry skin.

## 2015-04-12 NOTE — [facility] Group Notes (Signed)
[facility] Group Notes:  (Nursing/MHT/Case Management/Adjunct)  Date:  04/12/2015  Time:  2:09 PM  Type of Therapy:  Psychoeducational Skills  Participation Level:  Active  Participation Quality:  Appropriate  Affect:  Appropriate and Excited  Cognitive:  Appropriate and Oriented  Insight:  Appropriate  Engagement in Group:  Engaged  Modes of Intervention:  Discussion and Education  Summary of Progress/Problems:  Mickey Farber 04/12/2015, 2:09 PM

## 2015-04-12 NOTE — Progress Notes (Signed)
[facility] MD Progress Note  04/12/2015 1:29 PM Greg [name].  MRN:  [id number]  Subjective:  Greg [name] is still irritable and hyperactive. He has been exercising doing karate moves in the hallway. He reports fair sleep and good appetite. There are no somatic complaints. He was restarted on medications and tolerates them well. He participates in programming. There were no unwanted behaviors. He denies symptoms of depression, anxiety, or psychosis. Unfortunately he still refuses injectable antipsychotic.   Principal Problem: Bipolar affective disorder, currently manic, severe, with psychotic features Diagnosis:   Patient Active Problem List   Diagnosis Date Noted  . Schizo-affective psychosis [F25.9] 04/11/2015  . Bipolar affective disorder, manic, severe, with psychotic behavior [F31.2] 04/11/2015  . Suicidal ideation [R45.851] 02/07/2015  . Paranoia [F22]   . Bipolar affective disorder, currently manic, severe, with psychotic features [F31.2] 11/28/2014  . HTN (hypertension) [I10] 11/28/2014  . Dyslipidemia [E78.5] 11/28/2014  . Obesity [E66.9] 11/28/2014  . Diabetes [E11.9] 11/28/2014  . Tobacco use disorder [Z72.0] 11/28/2014   Total Time spent with patient: 20 minutes   Past Medical History:  Past Medical History  Diagnosis Date  . Diabetes   . Obesity   . Dyslipidemia   . HTN (hypertension)   . Schizo affective schizophrenia   . Paranoia   . Bipolar 1 disorder     Past Surgical History  Procedure Laterality Date  . Tonsillectomy    . Appendectomy     Family History: History reviewed. No pertinent family history. Social History:  History  Alcohol Use No     History  Drug Use No    Social History   Social History  . Marital Status: Single    Spouse Name: N/A  . Number of Children: N/A  . Years of Education: N/A   Social History Main Topics  . Smoking status: Current Every Day Smoker -- 1.00 packs/day    Types: Cigarettes  . Smokeless tobacco: Current  User    Types: Snuff  . Alcohol Use: No  . Drug Use: No  . Sexual Activity: Not Asked   Other Topics Concern  . None   Social History Narrative   Additional History:    Sleep: Fair  Appetite:  Fair   Assessment:   Musculoskeletal: Strength & Muscle Tone: within normal limits Gait & Station: normal Patient leans: N/A   Psychiatric Specialty Exam: Physical Exam  Nursing note and vitals reviewed.   Review of Systems  All other systems reviewed and are negative.   Blood pressure 116/75, pulse 98, temperature 98.2 F (36.8 C), temperature source Oral, height  (1.778 m), weight 106.142 kg (234 lb), SpO2 99 %.Body mass index is 33.58 kg/(m^2).  General Appearance: Disheveled  Eye Solicitor::  Fair  Speech:  Pressured  Volume:  Increased  Mood:  Angry, Dysphoric and Irritable  Affect:  Labile  Thought Process:  Goal Directed  Orientation:  Full (Time, Place, and Person)  Thought Content:  Delusions and Paranoid Ideation  Suicidal Thoughts:  No  Homicidal Thoughts:  No  Memory:  Immediate;   Fair Recent;   Fair Remote;   Fair  Judgement:  Impaired  Insight:  Lacking  Psychomotor Activity:  Increased  Concentration:  Fair  Recall:  [employer] of Knowledge:Fair  Language: Fair  Akathisia:  No  Handed:  Right  AIMS (if indicated):     Assets:  Communication Skills Desire for Improvement Financial Resources/Insurance Housing Resilience Social Support  ADL's:  Intact  Cognition: WNL  Sleep:  Number of Hours: 7.45     Current Medications: Current Facility-Administered Medications  Medication Dose Route Frequency Provider Last Rate Last Dose  . acetaminophen (TYLENOL) tablet 650 mg  650 mg Oral Q6H PRN Beau Fanny, MD      . alum & mag hydroxide-simeth (MAALOX/MYLANTA) 200-200-20 MG/5ML suspension 30 mL  30 mL Oral Q4H PRN Beau Fanny, MD      . divalproex (DEPAKOTE) DR tablet 500 mg  500 mg Oral Q12H Yakelin Grenier B Jhair Witherington, MD   500 mg at 04/12/15  0942  . lithium carbonate (LITHOBID) CR tablet 600 mg  600 mg Oral Q12H Reyah Streeter B Littleton Haub, MD   600 mg at 04/12/15 0942  . magnesium hydroxide (MILK OF MAGNESIA) suspension 30 mL  30 mL Oral Daily PRN Beau Fanny, MD      . metFORMIN (GLUCOPHAGE) tablet 1,000 mg  1,000 mg Oral BID WC Alexis Mizuno B Seyed Heffley, MD   1,000 mg at 04/12/15 0745  . metoprolol succinate (TOPROL-XL) 24 hr tablet 50 mg  50 mg Oral Daily Winifred Balogh B Kostantinos Tallman, MD   50 mg at 04/12/15 0942  . nicotine (NICODERM CQ - dosed in mg/24 hours) patch 21 mg  21 mg Transdermal Daily Mumtaz Lovins B Jesaiah Fabiano, MD   21 mg at 04/12/15 0945  . paliperidone (INVEGA) 24 hr tablet 6 mg  6 mg Oral Daily Shari Prows, MD   6 mg at 04/12/15 0942  . simvastatin (ZOCOR) tablet 10 mg  10 mg Oral q1800 Laycee Fitzsimmons B Moe Brier, MD   10 mg at 04/11/15 1736  . traZODone (DESYREL) tablet 100 mg  100 mg Oral QHS Shari Prows, MD   100 mg at 04/11/15 2135    Lab Results:  Results for orders placed or performed during the hospital encounter of 04/11/15 (from the past 48 hour(s))  Glucose, capillary     Status: Abnormal   Collection Time: 04/11/15  4:51 PM  Result Value Ref Range   Glucose-Capillary 116 (H) 65 - 99 mg/dL  Glucose, capillary     Status: Abnormal   Collection Time: 04/12/15 12:19 PM  Result Value Ref Range   Glucose-Capillary 129 (H) 65 - 99 mg/dL   Comment 1 Notify RN     Physical Findings: AIMS:  , ,  ,  ,    CIWA:    COWS:     Treatment Plan Summary: Daily contact with patient to assess and evaluate symptoms and progress in treatment and Medication management   Medical Decision Making:  New problem, with additional work up planned, Review of Psycho-Social Stressors (1), Review or order clinical lab tests (1), Review of Medication Regimen & Side Effects (2) and Review of New Medication or Change in Dosage (2)   Greg [name] is a 49 year old male with a history of severe bipolar illness admitted in a manic psychotic  episode with low lithium level.  1. Mood and psychosis. We will continue Depakote. Valproic acid level on admission was therapeutic. We will restart lithium. Lithium level on admission was low. We will continue Invega. The patient on multiple occasions was encouraged to take injectable psychotics but he refuses.  2. Diabetes. We'll continue metformin.  3. Hypertension. We will continue Toprol.  4. Dyslipidemia. We'll continue Zocor.   5. Smoking. Nicotine patch is available.  6 insomnia. We'll continue trazodone.  7. Disposition. He will return to home with his parents. He will follow up with his psychiatrist.  Zamarian Scarano 04/12/2015, 1:29 PM

## 2015-04-12 NOTE — Progress Notes (Addendum)
Observed in the Day Room interacting well with others, called to a private area to maintain confidentiality of PHI. Allowed to vent and express concerns or issues, patient described that he had "elevation of Dopamine that led to my hypermanic behavior before I finally checked my self in; I also consumed a lot of caffeine .[name].." Noted for some tremors of hands, denied any pain, has a great insight to the disease process, denied any for of hallucinations, denied SI/SIB/HI.

## 2015-04-12 NOTE — Progress Notes (Signed)
Recreation Therapy Notes  Date: 09.13.16 Time: 3:00 pm Location: Craft Room  Group Topic: Goal Setting  Goal Area(s) Addresses:  Patient will write at least one goal. Patient will write at least one obstacle.  Behavioral Response: Arrived late, Intermittently attentive, disruptive  Intervention: Recovery Goal Chart  Activity: Patients were instructed to write goals, obstacles, the date they started working on their goals, and the date they achieved their goals.  Education: LRT educated patients on healthy ways they can celebrate reaching their goals.   Education Outcome: Acknowledges education/In group clarification offered  Clinical Observations/Feedback: Patient wrote goals, obstacles, and the date he started working on his goals. Patient had side conversation with peer. LRT had to redirect patient two times. Patient compliant. Patient contributed to group discussion by stating healthy ways he can celebrate achieving his goals.  Jacquelynn Cree, LRT/CTRS 04/12/2015 4:35 PM

## 2015-04-13 LAB — GLUCOSE, CAPILLARY
Glucose-Capillary: 130 mg/dL — ABNORMAL HIGH (ref 65–99)
Glucose-Capillary: 120 mg/dL — ABNORMAL HIGH (ref 65–99)
Glucose-Capillary: 110 mg/dL — ABNORMAL HIGH (ref 65–99)

## 2015-04-13 MED ORDER — PALIPERIDONE PALMITATE 156 MG/ML IM SUSP
156.0000 mg | Freq: Once | INTRAMUSCULAR | Status: AC
  Administered 2015-04-16: 156 mg via INTRAMUSCULAR
  Filled 2015-04-13 (×2): qty 1

## 2015-04-13 MED ORDER — PALIPERIDONE PALMITATE 234 MG/1.5ML IM SUSP: 234.0000 mg | INTRAMUSCULAR | Status: DC

## 2015-04-13 NOTE — Progress Notes (Signed)
D:paetient standing in front  of nursing  station this am began doing gesture as with fighting some one . Patient pulled shirt off body ripping it many times over . Patient flexed chest muscles  out . Pumping biceps . Standing as though he had the perfect body. Patient began to refer to a spiritual component . Instructed patient to returned to his room patient did so. Writer spoke to patient. Patient referred to himself as a part of the trinity. Greg [name]   And [employer]. Patient began to speak to some one else in the room but there was no one else in the room. " you will have a wife one day your celibacy will not be in vein  " Patient spoke of a woman but he voice to himself that she was already married Writer later went back to talk to patient , noted in good spirits . Voice to writer of his perfect body . Patient later went to sleep over heard snoring. A: Encourage  Patient to come to nursing staff for any concerns. Encourage participation with  Unit programming. Patient compliant with returning to his room when instructed.   Instructions given on  Medication , verbalizing understanding  R: Receptive to information received. Voice no other concerns .

## 2015-04-13 NOTE — Plan of Care (Signed)
Problem: [facility] Participation in Recreation Therapeutic Interventions Goal: STG-Other Recreation Therapy Goal (Specify) STG: Decision Making - Within 4 treatment sessions, patient will verbalize understanding of decision making charts in each of 2 treatment sessions to increase good decision making post d/c.  Outcome: Progressing Treatment Session 1; Completed 1 out of 1: At approximately 12:30 pm, LRT met with patient in consultation room. LRT educated patient on decision making chart. Patient verbalized understanding. LRT encouraged patient to use chart to help make good decisions.  Leonette Monarch, LRT/CTRS 09.14.16 2:26 pm  Problem: [facility] Participation in Recreation Therapeutic Interventions Goal: STG-Other Recreation Therapy Goal (Specify) STG: Time Management - Within 4 treatment sessions, patient will verbalize understanding of scheduling in each of 2 treatment sessions to increase time management skills post d/c.  Outcome: Progressing Treatment Session 1; Completed 1 out of 2: At approximately 12:30 pm, LRT met with patient in consultation room. LRT educated patient on schedules. Patient verbalized understanding of scheduling. LRT encouraged patient to use schedules to help him manage his time.  Leonette Monarch, LRT/CTRS 09.14.16 2:28 pm

## 2015-04-13 NOTE — Progress Notes (Signed)
Recreation Therapy Notes  Date: 09.14.16 Time: 3:15 pm Location: Craft Room  Group Topic: Self-esteem, coping skills  Goal Area(s) Addresses:  Patient will identify at least one positive trait about self. Patient will identify at least one healthy coping skill.  Behavioral Response: Attentive, Interactive  Intervention: All About Me  Activity: Patients were instructed to make an All About Me pamphlet listing their life's motto, positive traits, healthy coping skills, and their healthy support system.  Education: LRT educated patients on ways they can increase their self-esteem.  Education Outcome: Acknowledges education/In group clarification offered   Clinical Observations/Feedback: Patient completed activity by listing his life's motto, positive traits, healthy coping skills, and his support system. Patient contributed to group discussion by stating why it is important to have a healthy support system.  Jacquelynn Cree, LRT/CTRS 04/13/2015 4:54 PM

## 2015-04-13 NOTE — [facility] Counselor (Signed)
Adult Comprehensive Assessment  Patient ID: Octavis Sheeler., male   DOB: 09-23-1965, 49 y.o.   MRN: [id number]  Information Source:    Current Stressors:     Living/Environment/Situation:  Living Arrangements: Parent  Family History:     Childhood History:     Education:     Employment/Work Situation:      Architect:   Surveyor, quantity resources: Insurance claims handler  Alcohol/Substance Abuse:      Social Support System:   [employer] Support System: Good  Leisure/Recreation:      Strengths/Needs:      Discharge Plan:   Does patient have access to transportation?: Yes Will patient be returning to same living situation after discharge?: Yes Currently receiving community mental health services: Yes (From Whom) Vesta Mixer) Does patient have financial barriers related to discharge medications?: No  Summary/Recommendations:  Pt is a single 49 yo wm admitted for bizarre thougths and paranoid ideations about [employer] and the [employer]. Patient lives with his parents and his mother is very supportive and follows up at [facility] though no longer has ACT services. Patient has been pleasant and cooperative on the unit. Patient will stabilize on meds and discharge to his previous living siutation. Patient is encouraged to particpate in group therapy, medication management, and therapuetic mileu.     Lulu Riding., MSW, Theresia Majors 04/13/2015

## 2015-04-13 NOTE — [facility] Group Notes (Signed)
[facility] Group Notes:  (Nursing/MHT/Case Management/Adjunct)  Date:  04/13/2015  Time:  12:25 PM  Type of Therapy:  Group Therapy  Participation Level:  Active  Participation Quality:  Appropriate  Affect:  Appropriate  Cognitive:  Appropriate  Insight:  Good  Engagement in Group:  Engaged  Modes of Intervention:  Activity  Summary of Progress/Problems:  Greg [name] 04/13/2015, 12:25 PM

## 2015-04-13 NOTE — [facility] Group Notes (Signed)
[facility] LCSW Group Therapy  04/13/2015 5:00 PM  Type of Therapy:  Group Therapy  Participation Level:  Active  Participation Quality:  Appropriate and Attentive  Affect:  Appropriate  Cognitive:  Alert, Appropriate and Oriented  Insight:  Engaged  Engagement in Therapy:  Engaged  Modes of Intervention:  Socialization and Support  Summary of Progress/Problems: Patient attended and participated in group discussion appropriately and was able to identify with other group members in experiencing feelings of depression and anxiety. Patient was hyperverbal but easily redirectable and aware of his symptoms.  Lulu Riding, MSW, LCSWA 04/13/2015, 5:00 PM

## 2015-04-13 NOTE — Progress Notes (Signed)
[facility] MD Progress Note  04/13/2015 11:58 AM Abdinasir Elsie Ra.  MRN:  [id number]  Subjective:  Mr. Messina is floridly psychotic. He thinks he is [employer] and the [place]. He disrobed in the hallway today in front of the nurses. He is preoccupied with starting a family. He is compliant with treatment and took [place] injection last night. He slept 6 hours. Appetite is good. There are no somatic complaints  Principal Problem: Bipolar affective disorder, currently manic, severe, with psychotic features Diagnosis:   Patient Active Problem List   Diagnosis Date Noted  . Schizo-affective psychosis [F25.9] 04/11/2015  . Bipolar affective disorder, manic, severe, with psychotic behavior [F31.2] 04/11/2015  . Suicidal ideation [R45.851] 02/07/2015  . Paranoia [F22]   . Bipolar affective disorder, currently manic, severe, with psychotic features [F31.2] 11/28/2014  . HTN (hypertension) [I10] 11/28/2014  . Dyslipidemia [E78.5] 11/28/2014  . Obesity [E66.9] 11/28/2014  . Diabetes [E11.9] 11/28/2014  . Tobacco use disorder [Z72.0] 11/28/2014   Total Time spent with patient: 20 minutes   Past Medical History:  Past Medical History  Diagnosis Date  . Diabetes   . Obesity   . Dyslipidemia   . HTN (hypertension)   . Schizo affective schizophrenia   . Paranoia   . Bipolar 1 disorder     Past Surgical History  Procedure Laterality Date  . Tonsillectomy    . Appendectomy     Family History: History reviewed. No pertinent family history. Social History:  History  Alcohol Use No     History  Drug Use No    Social History   Social History  . Marital Status: Single    Spouse Name: N/A  . Number of Children: N/A  . Years of Education: N/A   Social History Main Topics  . Smoking status: Current Every Day Smoker -- 1.00 packs/day    Types: Cigarettes  . Smokeless tobacco: Current User    Types: Snuff  . Alcohol Use: No  . Drug Use: No  . Sexual Activity: Not Asked    Other Topics Concern  . None   Social History Narrative   Additional History:    Sleep: Fair  Appetite:  Good   Assessment:   Musculoskeletal: Strength & Muscle Tone: within normal limits Gait & Station: normal Patient leans: N/A   Psychiatric Specialty Exam: Physical Exam  Nursing note and vitals reviewed.   Review of Systems  All other systems reviewed and are negative.   Blood pressure 139/82, pulse 108, temperature 98.1 F (36.7 C), temperature source Oral, resp. rate 18, height 5\' 10"  (1.778 m), weight 106.142 kg (234 lb), SpO2 99 %.Body mass index is 33.58 kg/(m^2).  General Appearance: Disheveled  Eye Solicitor::  Fair  Speech:  Clear and Coherent  Volume:  Normal  Mood:  Euphoric  Affect:  Labile  Thought Process:  Disorganized  Orientation:  Full (Time, Place, and Person)  Thought Content:  Delusions, Hallucinations: Auditory and Paranoid Ideation  Suicidal Thoughts:  No  Homicidal Thoughts:  No  Memory:  Immediate;   Fair Recent;   Fair Remote;   Fair  Judgement:  Impaired  Insight:  Lacking  Psychomotor Activity:  Increased  Concentration:  Fair  Recall:  [employer] of Knowledge:Fair  Language: Fair  Akathisia:  No  Handed:  Right  AIMS (if indicated):     Assets:  Communication Skills Desire for Improvement Financial Resources/Insurance Housing Physical Health Resilience Social Support  ADL's:  Intact  Cognition: WNL  Sleep:  Number of Hours: 6     Current Medications: Current Facility-Administered Medications  Medication Dose Route Frequency Provider Last Rate Last Dose  . acetaminophen (TYLENOL) tablet 650 mg  650 mg Oral Q6H PRN Beau Fanny, MD      . alum & mag hydroxide-simeth (MAALOX/MYLANTA) 200-200-20 MG/5ML suspension 30 mL  30 mL Oral Q4H PRN Beau Fanny, MD      . divalproex (DEPAKOTE) DR tablet 500 mg  500 mg Oral Q12H Huck Ashworth B Lucia Mccreadie, MD   500 mg at 04/13/15 0910  . lithium carbonate (LITHOBID) CR tablet 600  mg  600 mg Oral Q12H Bowen Kia B Chaquetta Schlottman, MD   600 mg at 04/13/15 0910  . magnesium hydroxide (MILK OF MAGNESIA) suspension 30 mL  30 mL Oral Daily PRN Beau Fanny, MD      . metFORMIN (GLUCOPHAGE) tablet 1,000 mg  1,000 mg Oral BID WC Shari Prows, MD   1,000 mg at 04/13/15 0748  . metoprolol succinate (TOPROL-XL) 24 hr tablet 50 mg  50 mg Oral Daily Ikran Patman B Shontel Santee, MD   50 mg at 04/13/15 0910  . nicotine (NICODERM CQ - dosed in mg/24 hours) patch 21 mg  21 mg Transdermal Daily Dottie Vaquerano B Maisey Deandrade, MD   21 mg at 04/13/15 0920  . [START ON 04/16/2015] paliperidone (INVEGA SUSTENNA) injection 156 mg  156 mg Intramuscular Once Sandon Yoho B Alexandra Posadas, MD      . [name] ON 05/13/2015] paliperidone (INVEGA SUSTENNA) injection 234 mg  234 mg Intramuscular Q28 days Yohan Samons B Tyshawn Ciullo, MD      . paliperidone (INVEGA) 24 hr tablet 6 mg  6 mg Oral Daily Azlynn Mitnick B Makeila Yamaguchi, MD   6 mg at 04/13/15 0910  . simvastatin (ZOCOR) tablet 10 mg  10 mg Oral q1800 Kharon Hixon B Kaeden Mester, MD   10 mg at 04/12/15 1647  . traZODone (DESYREL) tablet 100 mg  100 mg Oral QHS Shari Prows, MD   100 mg at 04/12/15 2131    Lab Results:  Results for orders placed or performed during the hospital encounter of 04/11/15 (from the past 48 hour(s))  Glucose, capillary     Status: Abnormal   Collection Time: 04/11/15  4:51 PM  Result Value Ref Range   Glucose-Capillary 116 (H) 65 - 99 mg/dL  Glucose, capillary     Status: Abnormal   Collection Time: 04/12/15 12:19 PM  Result Value Ref Range   Glucose-Capillary 129 (H) 65 - 99 mg/dL   Comment 1 Notify RN   Glucose, capillary     Status: Abnormal   Collection Time: 04/12/15  4:42 PM  Result Value Ref Range   Glucose-Capillary 120 (H) 65 - 99 mg/dL   Comment 1 Notify RN   Glucose, capillary     Status: Abnormal   Collection Time: 04/13/15  7:02 AM  Result Value Ref Range   Glucose-Capillary 130 (H) 65 - 99 mg/dL    Physical Findings: AIMS:  , ,  ,   ,    CIWA:    COWS:     Treatment Plan Summary: Daily contact with patient to assess and evaluate symptoms and progress in treatment and Medication management   Medical Decision Making:  Established Problem, Stable/Improving (1), Review of Psycho-Social Stressors (1), Review or order clinical lab tests (1), Review of Medication Regimen & Side Effects (2) and Review of New Medication or Change in Dosage (2)   Mr. Gosselin is a 49 year old male with a  history of severe bipolar illness admitted in a manic psychotic episode with low lithium level.  1. Mood and psychosis. We will continue Depakote. Valproic acid level on admission was therapeutic. We will restart lithium. Lithium level on admission was low. We will continue oral Invega. The patient agreed to start Invega sustenna injections on 9/13.   2. Diabetes. We'll continue metformin.  3. Hypertension. We will continue Toprol.  4. Dyslipidemia. We'll continue Zocor.   5. Smoking. Nicotine patch is available.  6 insomnia. We'll continue trazodone.  7. Disposition. He will return to home with his parents. He will follow up with his psychiatrist.      Kristine Linea 04/13/2015, 11:58 AM

## 2015-04-13 NOTE — Progress Notes (Signed)
Patient shaved during shift and reports to nurse that he stopped his medications because they lower his libido. The patient expresses that he would like to have a wife and children one day. The patient states that having a family is his main goal. The patient did not sleep and spent most of the night walking the hallways and changing shirts. The patient is pleasant. No tremors noted this shift. Will continue to monitor. Patient denies SI/ Q 15 checks maintained.

## 2015-04-13 NOTE — Plan of Care (Signed)
Problem: Ineffective individual coping Goal: STG-Increase in ability to manage activities of daily living Outcome: Progressing Patient shaved tonight while supervised by the nurse.

## 2015-04-14 LAB — GLUCOSE, CAPILLARY
Glucose-Capillary: 122 mg/dL — ABNORMAL HIGH (ref 65–99)
Glucose-Capillary: 156 mg/dL — ABNORMAL HIGH (ref 65–99)
Glucose-Capillary: 122 mg/dL — ABNORMAL HIGH (ref 65–99)

## 2015-04-14 NOTE — [facility] Suicide Risk Assessment (Signed)
[facility] INPATIENT:  Family/Significant Other Suicide Prevention Education  Suicide Prevention Education:  Education Completed; Greg [name] (mother) [phone] has been identified by the patient as the family member/significant other with whom the patient will be residing, and identified as the person(s) who will aid the patient in the event of a mental health crisis (suicidal ideations/suicide attempt).  With written consent from the patient, the family member/significant other has been provided the following suicide prevention education, prior to the and/or following the discharge of the patient.  The suicide prevention education provided includes the following:  Suicide risk factors  Suicide prevention and interventions  National Suicide Hotline telephone number  [facility] assessment telephone number  [facility] Emergency Assistance 911  [facility] and/or Residential Mobile Crisis Unit telephone number  Request made of family/significant other to:  Remove weapons (e.g., guns, rifles, knives), all items previously/currently identified as safety concern.    Remove drugs/medications (over-the-counter, prescriptions, illicit drugs), all items previously/currently identified as a safety concern.  The family member/significant other verbalizes understanding of the suicide prevention education information provided.  The family member/significant other agrees to remove the items of safety concern listed above.  Greg [name], MSW, LCSWA 04/14/2015, 2:53 PM

## 2015-04-14 NOTE — Plan of Care (Signed)
Problem: Alteration in thought process Goal: LTG-Patient has not harmed self or others in at least 2 days Patient has not demonstrated self harm or harm towards others since admittance.

## 2015-04-14 NOTE — Progress Notes (Signed)
D: Patient stated he slept good last night but has poor concentration. He rates his depression at a 5 on scale of 0-10. He rates his hopelessness at 7 on a scale of 0-10. He states he has pain in his arm at 3 at a scale of 0-10. He denies SI/HI. He states his goal for today is to leave and that he hopes the [facility] doesn't come on him so that he can behave. He said he'll do this by praying. A: Patient was educated on medication and encouraged to go to groups R: Patient was compliant

## 2015-04-14 NOTE — Plan of Care (Signed)
Problem: [facility] Participation in Recreation Therapeutic Interventions Goal: STG-Other Recreation Therapy Goal (Specify) STG: Decision Making - Within 4 treatment sessions, patient will verbalize understanding of decision making charts in each of 2 treatment sessions to increase good decision making post d/c.  Outcome: Completed/Met Date Met:  04/14/15 Treatment Session 2; Completed 2 out of 2: At approximately 9:25 am, LRT met with patient in craft room. LRT educated patient on a new decision making chart. Patient verbalized understanding. LRT encouraged patient to use the decision making charts to help him make better decisions.  Leonette Monarch, LRT/CTRS 09.15.16 12:43 pm  Problem: [facility] Participation in Recreation Therapeutic Interventions Goal: STG-Other Recreation Therapy Goal (Specify) STG: Time Management - Within 4 treatment sessions, patient will verbalize understanding of scheduling in each of 2 treatment sessions to increase time management skills post d/c.  Outcome: Completed/Met Date Met:  04/14/15 Treatment Session 2; Completed 2 out of 2: At approximately 9:25 am, LRT met with patient in craft room. Patient verbalized understanding of scheduling.   Leonette Monarch, LRT/CTRS 09.15.16 12:44 pm

## 2015-04-14 NOTE — Progress Notes (Signed)
Recreation Therapy Notes  Date: 09.15.16 Time: 3:00 pm Location: Craft Room  Group Topic: Leisure Education, Coping skills  Goal Area(s) Addresses:  Patient will identify things they are grateful. Patient will identify how being grateful can influence decision making.  Behavioral Response: Arrived late, Attentive, Interactive  Intervention: Grateful Wheel  Activity: Patients were given a I Am Grateful For worksheet and instructed to write at least one thing they are grateful for under each category.  Education:LRT educated patients on ways they can integrate leisure into their schedules.  Education Outcome: Acknowledges education/In group clarification offered  Clinical Observations/Feedback: Patient arrived to group at approximately 3:23 pm. LRT explained activity to patient. Patient did not work on Film/video editor. Patient contributed to group discussion by stating things he was grateful for, and how he can integrate leisure into his schedule.  Jacquelynn Cree, LRT/CTRS 04/14/2015 4:26 PM

## 2015-04-14 NOTE — Plan of Care (Signed)
Problem: Ineffective individual coping Goal: LTG: Patient will report a decrease in negative feelings Outcome: Progressing Patient denies SI.      

## 2015-04-14 NOTE — Progress Notes (Signed)
[facility] MD Progress Note  04/14/2015 1:51 PM Greg [name].  MRN:  [id number]  Subjective:  Greg [name] wants the [facility] to come upon him to .help him behave and be discharged. He is psychotic, religiously preoccupied, and somewhat hypersexual. He accepts medications and tolerates them well. Sleep and appetite are good. He does not participate in programming. There are no somatic complaints.  Principal Problem: Bipolar affective disorder, currently manic, severe, with psychotic features Diagnosis:   Patient Active Problem List   Diagnosis Date Noted  . Schizo-affective psychosis [F25.9] 04/11/2015  . Bipolar affective disorder, manic, severe, with psychotic behavior [F31.2] 04/11/2015  . Suicidal ideation [R45.851] 02/07/2015  . Paranoia [F22]   . Bipolar affective disorder, currently manic, severe, with psychotic features [F31.2] 11/28/2014  . HTN (hypertension) [I10] 11/28/2014  . Dyslipidemia [E78.5] 11/28/2014  . Obesity [E66.9] 11/28/2014  . Diabetes [E11.9] 11/28/2014  . Tobacco use disorder [Z72.0] 11/28/2014   Total Time spent with patient: 20 minutes   Past Medical History:  Past Medical History  Diagnosis Date  . Diabetes   . Obesity   . Dyslipidemia   . HTN (hypertension)   . Schizo affective schizophrenia   . Paranoia   . Bipolar 1 disorder     Past Surgical History  Procedure Laterality Date  . Tonsillectomy    . Appendectomy     Family History: History reviewed. No pertinent family history. Social History:  History  Alcohol Use No     History  Drug Use No    Social History   Social History  . Marital Status: Single    Spouse Name: N/A  . Number of Children: N/A  . Years of Education: N/A   Social History Main Topics  . Smoking status: Current Every Day Smoker -- 1.00 packs/day    Types: Cigarettes  . Smokeless tobacco: Current User    Types: Snuff  . Alcohol Use: No  . Drug Use: No  . Sexual Activity: Not Asked   Other Topics  Concern  . None   Social History Narrative   Additional History:    Sleep: Good  Appetite:  Good   Assessment:   Musculoskeletal: Strength & Muscle Tone: within normal limits Gait & Station: normal Patient leans: N/A   Psychiatric Specialty Exam: Physical Exam  Nursing note and vitals reviewed.   Review of Systems  All other systems reviewed and are negative.   Blood pressure 134/87, pulse 106, temperature 98.1 F (36.7 C), temperature source Oral, resp. rate 18, height  (1.778 m), weight 106.142 kg (234 lb), SpO2 99 %.Body mass index is 33.58 kg/(m^2).  General Appearance: Casual  Eye Contact::  Fair  Speech:  Pressured  Volume:  Normal  Mood:  Euphoric  Affect:  Labile  Thought Process:  Goal Directed  Orientation:  Full (Time, Place, and Person)  Thought Content:  Delusions and Paranoid Ideation  Suicidal Thoughts:  No  Homicidal Thoughts:  No  Memory:  Immediate;   Fair Recent;   Fair Remote;   Fair  Judgement:  Impaired  Insight:  Shallow  Psychomotor Activity:  Increased  Concentration:  Fair  Recall:  [employer] of Knowledge:Fair  Language: Fair  Akathisia:  No  Handed:  Right  AIMS (if indicated):     Assets:  Communication Skills Desire for Improvement Financial Resources/Insurance Housing Physical Health Resilience Social Support  ADL's:  Intact  Cognition: WNL  Sleep:  Number of Hours: 6  Current Medications: Current Facility-Administered Medications  Medication Dose Route Frequency Provider Last Rate Last Dose  . acetaminophen (TYLENOL) tablet 650 mg  650 mg Oral Q6H PRN Beau Fanny, MD   650 mg at 04/14/15 0326  . alum & mag hydroxide-simeth (MAALOX/MYLANTA) 200-200-20 MG/5ML suspension 30 mL  30 mL Oral Q4H PRN Beau Fanny, MD      . divalproex (DEPAKOTE) DR tablet 500 mg  500 mg Oral Q12H Orva Gwaltney B Janelis Stelzer, MD   500 mg at 04/14/15 0959  . lithium carbonate (LITHOBID) CR tablet 600 mg  600 mg Oral Q12H Sunshine Mackowski B  Ania Levay, MD   600 mg at 04/14/15 1001  . magnesium hydroxide (MILK OF MAGNESIA) suspension 30 mL  30 mL Oral Daily PRN Beau Fanny, MD      . metFORMIN (GLUCOPHAGE) tablet 1,000 mg  1,000 mg Oral BID WC Shari Prows, MD   1,000 mg at 04/14/15 0807  . metoprolol succinate (TOPROL-XL) 24 hr tablet 50 mg  50 mg Oral Daily Adenike Shidler B Annaliyah Willig, MD   50 mg at 04/14/15 1000  . nicotine (NICODERM CQ - dosed in mg/24 hours) patch 21 mg  21 mg Transdermal Daily Kyeshia Zinn B Ziona Wickens, MD   21 mg at 04/14/15 1005  . [START ON 04/16/2015] paliperidone (INVEGA SUSTENNA) injection 156 mg  156 mg Intramuscular Once Julaine Zimny B Valeria Krisko, MD      . [name] ON 05/13/2015] paliperidone (INVEGA SUSTENNA) injection 234 mg  234 mg Intramuscular Q28 days Theoplis Garciagarcia B Brysun Eschmann, MD      . paliperidone (INVEGA) 24 hr tablet 6 mg  6 mg Oral Daily Kelechi Astarita B Arlana Canizales, MD   6 mg at 04/14/15 1000  . simvastatin (ZOCOR) tablet 10 mg  10 mg Oral q1800 Donovan Persley B Tani Virgo, MD   10 mg at 04/13/15 1753  . traZODone (DESYREL) tablet 100 mg  100 mg Oral QHS Shari Prows, MD   100 mg at 04/13/15 2149    Lab Results:  Results for orders placed or performed during the hospital encounter of 04/11/15 (from the past 48 hour(s))  Glucose, capillary     Status: Abnormal   Collection Time: 04/12/15  4:42 PM  Result Value Ref Range   Glucose-Capillary 120 (H) 65 - 99 mg/dL   Comment 1 Notify RN   Glucose, capillary     Status: Abnormal   Collection Time: 04/13/15  7:02 AM  Result Value Ref Range   Glucose-Capillary 130 (H) 65 - 99 mg/dL  Glucose, capillary     Status: Abnormal   Collection Time: 04/13/15 12:11 PM  Result Value Ref Range   Glucose-Capillary 120 (H) 65 - 99 mg/dL   Comment 1 Notify RN   Glucose, capillary     Status: Abnormal   Collection Time: 04/13/15  4:32 PM  Result Value Ref Range   Glucose-Capillary 110 (H) 65 - 99 mg/dL   Comment 1 Notify RN   Glucose, capillary     Status: Abnormal    Collection Time: 04/14/15  6:16 AM  Result Value Ref Range   Glucose-Capillary 122 (H) 65 - 99 mg/dL  Glucose, capillary     Status: Abnormal   Collection Time: 04/14/15 12:00 PM  Result Value Ref Range   Glucose-Capillary 156 (H) 65 - 99 mg/dL    Physical Findings: AIMS:  , ,  ,  ,    CIWA:    COWS:     Treatment Plan Summary: Daily contact with patient to  assess and evaluate symptoms and progress in treatment and Medication management   Medical Decision Making:  Established Problem, Stable/Improving (1), Review of Psycho-Social Stressors (1), Review or order clinical lab tests (1), Review of Medication Regimen & Side Effects (2) and Review of New Medication or Change in Dosage (2)   Greg [name] is a 49 year old male with a history of severe bipolar illness admitted in a manic psychotic episode with low lithium level.  1. Mood and psychosis. We will continue Depakote. Valproic acid level on admission was therapeutic. We will restart lithium. Lithium level on admission was low. We will continue oral Invega. The patient agreed to start Invega sustenna injections on 9/13. Next injection is on Saturday. Anticipated discharge on Monday. Will check lithium level.  2. Diabetes. We'll continue metformin.  3. Hypertension. We will continue Toprol.  4. Dyslipidemia. We'll continue Zocor.   5. Smoking. Nicotine patch is available.  6 insomnia. We'll continue trazodone.  7. Disposition. He will return to home with his parents. He will follow up with his psychiatrist.     Kristine Linea 04/14/2015, 1:51 PM

## 2015-04-14 NOTE — Tx Team (Signed)
Interdisciplinary Treatment Plan Update (Adult)  Date:  04/14/2015 Time Reviewed:  2:33 PM  Progress in Treatment: Attending groups: Yes. Participating in groups:  Yes. Taking medication as prescribed:  Yes. Tolerating medication:  Yes. Family/Significant othe contact made:  No, will contact:  mother Tyheim Vanalstyne Patient understands diagnosis:  Yes. Discussing patient identified problems/goals with staff:  Yes. Medical problems stabilized or resolved:  Yes. Denies suicidal/homicidal ideation: Yes. Issues/concerns per patient self-inventory:  No. Other:  New problem(s) identified: No, Describe:  none reported  Discharge Plan or Barriers: Patient will return home with his parents and need hospital followup at discharge. Followed by Vesta Mixer  Reason for Continuation of Hospitalization: Delusions  Mania  Comments: adjusting meds for mania  Estimated length of stay: up to 4 days with expected discharge Friday 04/18/15  New goal(s):  Review of initial/current patient goals per problem list:   See Care Plan  Attendees: Physician:  Kristine Linea, MD 9/15/20162:33 PM  Nursing:   Leonia Reader, RN 9/15/20162:33 PM  Other:  Beryl Meager, LCSWA 9/15/20162:33 PM   Scribe for Treatment Team:   Lulu Riding, MSW, LCSWA  04/14/2015, 2:33 PM

## 2015-04-14 NOTE — Progress Notes (Signed)
The patient denies SI during shift. Patient complains of shoulder pain 6/10. Tylenol given. Patient slept more this shift but did have some periods of pacing during the night. Patient pleasant and talkative. Q 15 min checks maintained during shift for safety. Continue plan . Will continue to monitor.

## 2015-04-14 NOTE — [facility] Group Notes (Signed)
[facility] LCSW Group Therapy  04/14/2015 12:30 PM  Type of Therapy:  Group Therapy  Participation Level:  Active  Participation Quality:  Appropriate  Affect:  Appropriate  Cognitive:  Alert  Insight:  Developing/Improving  Engagement in Therapy:  Developing/Improving  Modes of Intervention:  Discussion, Education and Support  Summary of Progress/Problems::LCSW introduced group rules. Patients were encouraged to share obstacles they face and how they cope. The second half of group was how patients find balance in their lives. He was a good participant.  Johnella Moloney, Kaydi Kley M 04/14/2015, 12:30 PM

## 2015-04-15 LAB — GLUCOSE, CAPILLARY
Glucose-Capillary: 123 mg/dL — ABNORMAL HIGH (ref 65–99)
Glucose-Capillary: 149 mg/dL — ABNORMAL HIGH (ref 65–99)
Glucose-Capillary: 114 mg/dL — ABNORMAL HIGH (ref 65–99)

## 2015-04-15 NOTE — [facility] Group Notes (Signed)
[facility] Group Notes:  (Nursing/MHT/Case Management/Adjunct)  Date:  04/15/2015  Time:  11:52 AM  Type of Therapy:  Psychoeducational Skills  Participation Level:  Active  Participation Quality:  Appropriate and Attentive  Affect:  Appropriate  Cognitive:  Appropriate  Insight:  Appropriate  Engagement in Group:  Engaged  Modes of Intervention:  Activity and Discussion    Summary of Progress/Problems:  Greg [name] 04/15/2015, 11:52 AM

## 2015-04-15 NOTE — Progress Notes (Signed)
Recreation Therapy Notes  Date: 09.16.16 Time: 3:00 pm Location: Craft Room  Group Topic: Coping Skills  Goal Area(s) Addresses:  Patient will participate in healthy coping skill. Patient will verbalize at least one emotion experienced during group.  Behavioral Response: Attentive, Interactive  Intervention: Coloring  Activity: Patients were instructed to color either mandalas or other coloring sheets and focus on the emotions they were experiencing.  Education: LRT educated patients on different coping skills.  Education Outcome: Acknowledges education/In group clarification offered   Clinical Observations/Feedback: Patient colored coloring sheet. Patient left group at approximately 3:20 pm stating his back ws hurting. Patient returned to group at approximately 3:45 pm. Patient continued to color his coloring sheet. Patient contributed to group discussion by stating what emotions he felt.  Jacquelynn Cree, LRT/CTRS 04/15/2015 4:52 PM

## 2015-04-15 NOTE — Plan of Care (Signed)
Problem: Alteration in thought process Goal: LTG-Patient has not harmed self or others in at least 2 days Outcome: Progressing Patient has not harmed himself or verbalized any thoughts of harming himself.

## 2015-04-15 NOTE — Clinical Social Work Note (Signed)
CSW spoke with patient's mother Kye Hedden [phone] to share that patient could stay through the weekend unless the weekend psychiatrist can discharge. Patient has followup care and will stabilize on medications to discharge home.

## 2015-04-15 NOTE — Plan of Care (Signed)
Problem: Ineffective individual coping Goal: LTG: Patient will report a decrease in negative feelings Outcome: Progressing Pt reports feeling better, no longer manic.  Goal: STG: Patient will remain free from self harm Outcome: Progressing No self harm.  Goal: STG:Pt. will utilize relaxation techniques to reduce stress STG: Patient will utilize relaxation techniques to reduce stress levels  Outcome: Progressing Pt able to relax and sleep in his room.  Goal: STG-Increase in ability to manage activities of daily living Outcome: Progressing Pt able to independently complete ADL's.

## 2015-04-15 NOTE — [facility] Group Notes (Signed)
[facility] LCSW Aftercare Discharge Planning Group Note   04/15/2015 2:39 PM  Participation Quality:  Minimal   Mood/Affect:  Flat  Depression Rating:  3  Anxiety Rating:  0  Thoughts of Suicide:  No Will you contract for safety?   NA  Current AVH:  No  Plan for Discharge/Comments:  Pt reports his depression has increased but is unable to state why. He reports that he is no longer manic and that he believes he should be discharged. During group, he was distracting and slightly disorganized and grandiose. He states he will return to one of his six houses and follow up with outpatient. He states he is connected to [employer].   Transportation Means: Family   Supports: Family   Candace [employer] MSW, [employer]

## 2015-04-15 NOTE — Progress Notes (Signed)
D: Patient alert and oriented x4.  Denies SI or depression.  Continues to have disorganized thought process and continues to state "I hope the spirit helps me to control my mouth" A:  Medication compliant and group compliant. Visible in milieu.  Complained of generalized aching in back. R:  Safety maintained, tylenol relieved back pain.

## 2015-04-15 NOTE — Plan of Care (Signed)
Problem: Ineffective individual coping Goal: LTG: Patient will report a decrease in negative feelings Outcome: Progressing Pt notes he is feeling "good" and is appropriately active in the milieu. Joyful affect, laughing, telling stories from his past.  Goal: STG: Patient will remain free from self harm Outcome: Progressing No self harm.

## 2015-04-15 NOTE — Progress Notes (Signed)
Pt slept for 2. 25 hours in the early evening and was unable to return to sleep after waking. He has no PRN medications available. The patient remained oriented and pleasant despite his inability to sleep. The patient was witnessed multiple times laughing and talking when no one was with him, however he denies AVH. He denies current SI and HI. He is active in the milieu with his peers.

## 2015-04-15 NOTE — Plan of Care (Signed)
Problem: Alteration in thought process Goal: LTG-Patient behavior demonstrates decreased signs psychosis (Patient behavior demonstrates decreased signs of psychosis to the point the patient is safe to return home and continue treatment in an outpatient setting.)  Outcome: Not Progressing Less manic, continues to have delusional thoughts

## 2015-04-16 LAB — GLUCOSE, CAPILLARY
Glucose-Capillary: 129 mg/dL — ABNORMAL HIGH (ref 65–99)
Glucose-Capillary: 112 mg/dL — ABNORMAL HIGH (ref 65–99)
Glucose-Capillary: 113 mg/dL — ABNORMAL HIGH (ref 65–99)

## 2015-04-16 LAB — VALPROIC ACID LEVEL: Valproic Acid Lvl: 58 ug/mL (ref 50.0–100.0)

## 2015-04-16 LAB — TSH: TSH: 2.735 u[IU]/mL (ref 0.350–4.500)

## 2015-04-16 LAB — LITHIUM LEVEL: Lithium Lvl: 0.9 mmol/L (ref 0.60–1.20)

## 2015-04-16 LAB — LIPID PANEL
Cholesterol: 82 mg/dL (ref 0–200)
HDL: 29 mg/dL — ABNORMAL LOW (ref >40)
LDL Cholesterol: 34 mg/dL (ref 0–99)
Total CHOL/HDL Ratio: 2.8 RATIO
Triglycerides: 97 mg/dL (ref <150)
VLDL: 19 mg/dL (ref 0–40)

## 2015-04-16 LAB — HEMOGLOBIN A1C: Hgb A1c MFr Bld: 7.3 % — ABNORMAL HIGH (ref 4.0–6.0)

## 2015-04-16 NOTE — Progress Notes (Signed)
[facility] MD Progress Note  04/16/2015 4:10 PM Greg Elsie Ra.  MRN:  [id number]  Subjective:  Greg [name] is a 49 year old male with a history of severe bipolar ilness admitted yet again for psychotic, manic episode in the context of treatment noncompliance. He was seen for follow-up. Patient remains psychotic and reported that he is doing very well and is waiting to be discharged. He reported that he has discussed with Dr. Glenetta Hew  lost about his discharge and she has told him that he will be discharged on Monday and he is anxiously waiting for Monday. He was noted to be lying in the bed with his shoes on. He reported that there is nothing wrong with him. He remains psychotic and religiously preoccupied.   Principal Problem: Bipolar affective disorder, currently manic, severe, with psychotic features Diagnosis:   Patient Active Problem List   Diagnosis Date Noted  . Schizo-affective psychosis [F25.9] 04/11/2015  . Bipolar affective disorder, manic, severe, with psychotic behavior [F31.2] 04/11/2015  . Suicidal ideation [R45.851] 02/07/2015  . Paranoia [F22]   . Bipolar affective disorder, currently manic, severe, with psychotic features [F31.2] 11/28/2014  . HTN (hypertension) [I10] 11/28/2014  . Dyslipidemia [E78.5] 11/28/2014  . Obesity [E66.9] 11/28/2014  . Diabetes [E11.9] 11/28/2014  . Tobacco use disorder [Z72.0] 11/28/2014   Total Time spent with patient: 20 minutes   Past Medical History:  Past Medical History  Diagnosis Date  . Diabetes   . Obesity   . Dyslipidemia   . HTN (hypertension)   . Schizo affective schizophrenia   . Paranoia   . Bipolar 1 disorder     Past Surgical History  Procedure Laterality Date  . Tonsillectomy    . Appendectomy     Family History: History reviewed. No pertinent family history. Social History:  History  Alcohol Use No     History  Drug Use No    Social History   Social History  . Marital Status: Single    Spouse Name:  N/A  . Number of Children: N/A  . Years of Education: N/A   Social History Main Topics  . Smoking status: Current Every Day Smoker -- 1.00 packs/day    Types: Cigarettes  . Smokeless tobacco: Current User    Types: Snuff  . Alcohol Use: No  . Drug Use: No  . Sexual Activity: Not Asked   Other Topics Concern  . None   Social History Narrative   Additional History:    Sleep: Fair  Appetite:  Good   Assessment:   Musculoskeletal: Strength & Muscle Tone: within normal limits Gait & Station: normal Patient leans: N/A   Psychiatric Specialty Exam: Physical Exam  Nursing note and vitals reviewed.   Review of Systems  Psychiatric/Behavioral: Positive for depression. The patient is nervous/anxious.   All other systems reviewed and are negative.   Blood pressure 120/84, pulse 98, temperature 98.6 F (37 C), temperature source Oral, resp. rate 18, height  (1.778 m), weight 234 lb (106.142 kg), SpO2 99 %.Body mass index is 33.58 kg/(m^2).  General Appearance: Disheveled  Eye Solicitor::  Fair  Speech:  Clear and Coherent  Volume:  Increased  Mood:  Euphoric  Affect:  Labile  Thought Process:  Goal Directed  Orientation:  Full (Time, Place, and Person)  Thought Content:  Delusions, Hallucinations: Auditory and Paranoid Ideation  Suicidal Thoughts:  No  Homicidal Thoughts:  No  Memory:  Immediate;   Fair Recent;   Fair Remote;  Fair  Judgement:  Impaired  Insight:  Shallow  Psychomotor Activity:  Increased  Concentration:  Fair  Recall:  [employer] of Knowledge:Fair  Language: Fair  Akathisia:  No  Handed:  Right  AIMS (if indicated):     Assets:  Communication Skills Desire for Improvement Financial Resources/Insurance Housing Physical Health Resilience Social Support  ADL's:  Intact  Cognition: WNL  Sleep:  Number of Hours: 8.75     Current Medications: Current Facility-Administered Medications  Medication Dose Route Frequency Provider Last  Rate Last Dose  . acetaminophen (TYLENOL) tablet 650 mg  650 mg Oral Q6H PRN Beau Fanny, MD   650 mg at 04/15/15 1126  . alum & mag hydroxide-simeth (MAALOX/MYLANTA) 200-200-20 MG/5ML suspension 30 mL  30 mL Oral Q4H PRN Beau Fanny, MD      . divalproex (DEPAKOTE) DR tablet 500 mg  500 mg Oral Q12H [name], MD   500 mg at 04/16/15 0943  . lithium carbonate (LITHOBID) CR tablet 600 mg  600 mg Oral Q12H [name], MD   600 mg at 04/16/15 0943  . magnesium hydroxide (MILK OF MAGNESIA) suspension 30 mL  30 mL Oral Daily PRN Beau Fanny, MD      . metFORMIN (GLUCOPHAGE) tablet 1,000 mg  1,000 mg Oral BID WC [name], MD   1,000 mg at 04/16/15 0746  . metoprolol succinate (TOPROL-XL) 24 hr tablet 50 mg  50 mg Oral Daily [name], MD   50 mg at 04/16/15 0943  . nicotine (NICODERM CQ - dosed in mg/24 hours) patch 21 mg  21 mg Transdermal Daily [name], MD   21 mg at 04/16/15 0945  . [START ON 05/13/2015] paliperidone (INVEGA SUSTENNA) injection 234 mg  234 mg Intramuscular Q28 days [name], MD      . paliperidone (INVEGA) 24 hr tablet 6 mg  6 mg Oral Daily [name], MD   6 mg at 04/16/15 0943  . simvastatin (ZOCOR) tablet 10 mg  10 mg Oral q1800 [name], MD   10 mg at 04/15/15 1741  . traZODone (DESYREL) tablet 100 mg  100 mg Oral QHS Shari Prows, MD   100 mg at 04/15/15 2317    Lab Results:  Results for orders placed or performed during the hospital encounter of 04/11/15 (from the past 48 hour(s))  Glucose, capillary     Status: Abnormal   Collection Time: 04/14/15  4:31 PM  Result Value Ref Range   Glucose-Capillary 122 (H) 65 - 99 mg/dL  Glucose, capillary     Status: Abnormal   Collection Time: 04/15/15  5:40 AM  Result Value Ref Range   Glucose-Capillary 123 (H) 65 - 99 mg/dL  Glucose, capillary     Status: Abnormal   Collection Time: 04/15/15 11:43 AM  Result Value Ref Range    Glucose-Capillary 149 (H) 65 - 99 mg/dL  Glucose, capillary     Status: Abnormal   Collection Time: 04/15/15  4:19 PM  Result Value Ref Range   Glucose-Capillary 114 (H) 65 - 99 mg/dL   Comment 1 Notify RN   Valproic acid level     Status: None   Collection Time: 04/16/15  6:00 AM  Result Value Ref Range   Valproic Acid Lvl 58 50.0 - 100.0 ug/mL  Hemoglobin A1c     Status: Abnormal   Collection Time: 04/16/15  6:00 AM  Result Value Ref  Range   Hgb A1c MFr Bld 7.3 (H) 4.0 - 6.0 %  Lipid panel     Status: Abnormal   Collection Time: 04/16/15  6:00 AM  Result Value Ref Range   Cholesterol 82 0 - 200 mg/dL   Triglycerides 97 <161 mg/dL   HDL 29 (L) >09 mg/dL   Total CHOL/HDL Ratio 2.8 RATIO   VLDL 19 0 - 40 mg/dL   LDL Cholesterol 34 0 - 99 mg/dL    Comment:        Total Cholesterol/HDL:CHD Risk Coronary Heart Disease Risk Table                     Men   Women  1/2 Average Risk   3.4   3.3  Average Risk       5.0   4.4  2 X Average Risk   9.6   7.1  3 X Average Risk  23.4   11.0        Use the calculated Patient Ratio above and the CHD Risk Table to determine the patient's CHD Risk.        ATP III CLASSIFICATION (LDL):  <100     mg/dL   Optimal  604-540  mg/dL   Near or Above                    Optimal  130-159  mg/dL   Borderline  981-191  mg/dL   High  >478     mg/dL   Very High   TSH     Status: None   Collection Time: 04/16/15  6:00 AM  Result Value Ref Range   TSH 2.735 0.350 - 4.500 uIU/mL  Glucose, capillary     Status: Abnormal   Collection Time: 04/16/15  6:41 AM  Result Value Ref Range   Glucose-Capillary 129 (H) 65 - 99 mg/dL   Comment 1 Notify RN    Comment 2 Document in Chart   Glucose, capillary     Status: Abnormal   Collection Time: 04/16/15 11:43 AM  Result Value Ref Range   Glucose-Capillary 112 (H) 65 - 99 mg/dL    Physical Findings: AIMS:  , ,  ,  ,    CIWA:    COWS:     Treatment Plan Summary: Daily contact with patient to assess  and evaluate symptoms and progress in treatment and Medication management   Medical Decision Making:  Established Problem, Stable/Improving (1), Review of Psycho-Social Stressors (1), Review or order clinical lab tests (1), Review of Medication Regimen & Side Effects (2) and Review of New Medication or Change in Dosage (2)   Greg [name] is a 49 year old male with a history of severe bipolar illness admitted in a manic psychotic episode with low lithium level.  1. Mood and psychosis. We will continue Depakote. Valproic acid level on admission was therapeutic. We will restart lithium. Lithium level on admission was low. We will continue oral Invega. The patient agreed to start Invega sustenna injections on 9/13. Next injection is on Saturday. Anticipated discharge on Monday. Will check lithium level on Sat.  2. Diabetes. We'll continue metformin.  3. Hypertension. We will continue Toprol.  4. Dyslipidemia. We'll continue Zocor.   5. Smoking. Nicotine patch is available.  6 insomnia. We'll continue trazodone.  7. Disposition. He will return to home with his parents. He will follow up with his psychiatrist.     Brandy Hale 04/16/2015, 4:10 PM

## 2015-04-16 NOTE — Progress Notes (Signed)
Patient is pleasant and cooperative. He was able to rest well during the night and notes his belief that his mania is over. He is appropriate and interactive in the milieu. Denies SI, HI, and AVH and not witnessed to be responding to any internal stimuli.

## 2015-04-16 NOTE — Progress Notes (Signed)
D: patient denies any SI/HI this shift.  Patient pleasant and compliant with treatment plan and medications.  Patients energy level is appropriate.  Patient seen in the milieu walking the halls for most of the day.  Patient attend all groups.  Patient in no distress at this time. A: encouragement and support given and medications given as prescribed.   R: patient receptive of information and states he is ready for discharge.

## 2015-04-16 NOTE — [facility] Group Notes (Signed)
[facility] Group Notes:  (Nursing/MHT/Case Management/Adjunct)  Date:  04/16/2015  Time:  9:46 PM  Type of Therapy:  Evening Wrap-up Group  Participation Level:  Active  Participation Quality:  Appropriate  Affect:  Appropriate  Cognitive:  Appropriate  Insight:  Appropriate  Engagement in Group:  Engaged  Modes of Intervention:  Activity  Summary of Progress/Problems:  Greg [name] 04/16/2015, 9:46 PM

## 2015-04-16 NOTE — [facility] Group Notes (Signed)
[facility] LCSW Group Therapy  04/16/2015 3:30 PM  Type of Therapy:  Group Therapy  Participation Level:  Did Not Attend  Modes of Intervention:  Discussion, Education, Socialization and Support  Summary of Progress/Problems:Balance in life: Patients will discuss the concept of balance and how it looks and feels to be unbalanced. Pt will identify areas in their life that is unbalanced and ways to become more balanced.    Candace L Hyatt MSW, LCSWA  04/16/2015, 3:30 PM 

## 2015-04-16 NOTE — Plan of Care (Signed)
Problem: Ineffective individual coping Goal: STG: Patient will remain free from self harm Outcome: Progressing Patient seems more pleasant this shift and has been seen in the milieu with positive interaction with others.  Patient has reported a decrease in negative feelings.

## 2015-04-16 NOTE — Progress Notes (Signed)
[facility] MD Progress Note  04/16/2015 4:33 AM Greg [name].  MRN:  [id number]  Subjective:  Greg [name] is a 49 year old male with a history of severe bipolar ilness admitted yet again for psychotic, manic episode in the context of treatment noncompliance. He was restarted on lithium, will check lithumlevel in am. He agreed to [place] sustenna shots and will receive his second dose on Saturday.  The patient is still psychotic and religiously preoccupied but not overtly. There were no sexualized behaviors in the past 24 hours. He is less disruptive and hyperactive. He goes to groups.  Principal Problem: Bipolar affective disorder, currently manic, severe, with psychotic features Diagnosis:   Patient Active Problem List   Diagnosis Date Noted  . Schizo-affective psychosis [F25.9] 04/11/2015  . Bipolar affective disorder, manic, severe, with psychotic behavior [F31.2] 04/11/2015  . Suicidal ideation [R45.851] 02/07/2015  . Paranoia [F22]   . Bipolar affective disorder, currently manic, severe, with psychotic features [F31.2] 11/28/2014  . HTN (hypertension) [I10] 11/28/2014  . Dyslipidemia [E78.5] 11/28/2014  . Obesity [E66.9] 11/28/2014  . Diabetes [E11.9] 11/28/2014  . Tobacco use disorder [Z72.0] 11/28/2014   Total Time spent with patient: 20 minutes   Past Medical History:  Past Medical History  Diagnosis Date  . Diabetes   . Obesity   . Dyslipidemia   . HTN (hypertension)   . Schizo affective schizophrenia   . Paranoia   . Bipolar 1 disorder     Past Surgical History  Procedure Laterality Date  . Tonsillectomy    . Appendectomy     Family History: History reviewed. No pertinent family history. Social History:  History  Alcohol Use No     History  Drug Use No    Social History   Social History  . Marital Status: Single    Spouse Name: N/A  . Number of Children: N/A  . Years of Education: N/A   Social History Main Topics  . Smoking status: Current Every  Day Smoker -- 1.00 packs/day    Types: Cigarettes  . Smokeless tobacco: Current User    Types: Snuff  . Alcohol Use: No  . Drug Use: No  . Sexual Activity: Not Asked   Other Topics Concern  . None   Social History Narrative   Additional History:    Sleep: Fair  Appetite:  Good   Assessment:   Musculoskeletal: Strength & Muscle Tone: within normal limits Gait & Station: normal Patient leans: N/A   Psychiatric Specialty Exam: Physical Exam  Nursing note and vitals reviewed.   Review of Systems  All other systems reviewed and are negative.   Blood pressure 137/85, pulse 101, temperature 97.5 F (36.4 C), temperature source Oral, resp. rate 18, height 5\' 10"  (1.778 m), weight 106.142 kg (234 lb), SpO2 99 %.Body mass index is 33.58 kg/(m^2).  General Appearance: Disheveled  Eye Solicitor::  Fair  Speech:  Clear and Coherent  Volume:  Increased  Mood:  Euphoric  Affect:  Labile  Thought Process:  Goal Directed  Orientation:  Full (Time, Place, and Person)  Thought Content:  Delusions, Hallucinations: Auditory and Paranoid Ideation  Suicidal Thoughts:  No  Homicidal Thoughts:  No  Memory:  Immediate;   Fair Recent;   Fair Remote;   Fair  Judgement:  Impaired  Insight:  Shallow  Psychomotor Activity:  Increased  Concentration:  Fair  Recall:  [employer] of Knowledge:Fair  Language: Fair  Akathisia:  No  Handed:  Right  AIMS (if indicated):     Assets:  Communication Skills Desire for Improvement Financial Resources/Insurance Housing Physical Health Resilience Social Support  ADL's:  Intact  Cognition: WNL  Sleep:  Number of Hours: 4     Current Medications: Current Facility-Administered Medications  Medication Dose Route Frequency Provider Last Rate Last Dose  . acetaminophen (TYLENOL) tablet 650 mg  650 mg Oral Q6H PRN Beau Fanny, MD   650 mg at 04/15/15 1126  . alum & mag hydroxide-simeth (MAALOX/MYLANTA) 200-200-20 MG/5ML suspension 30 mL  30  mL Oral Q4H PRN Beau Fanny, MD      . divalproex (DEPAKOTE) DR tablet 500 mg  500 mg Oral Q12H Omaria Plunk B Nidya Bouyer, MD   500 mg at 04/15/15 2316  . lithium carbonate (LITHOBID) CR tablet 600 mg  600 mg Oral Q12H Alson Mcpheeters B Shannyn Jankowiak, MD   600 mg at 04/15/15 2317  . magnesium hydroxide (MILK OF MAGNESIA) suspension 30 mL  30 mL Oral Daily PRN Beau Fanny, MD      . metFORMIN (GLUCOPHAGE) tablet 1,000 mg  1,000 mg Oral BID WC Antara Brecheisen B Posie Lillibridge, MD   1,000 mg at 04/15/15 1741  . metoprolol succinate (TOPROL-XL) 24 hr tablet 50 mg  50 mg Oral Daily Shari Prows, MD   50 mg at 04/15/15 0903  . nicotine (NICODERM CQ - dosed in mg/24 hours) patch 21 mg  21 mg Transdermal Daily Shari Prows, MD   21 mg at 04/15/15 0904  . paliperidone (INVEGA SUSTENNA) injection 156 mg  156 mg Intramuscular Once Kayl Stogdill B Frederico Gerling, MD      . [name] ON 05/13/2015] paliperidone (INVEGA SUSTENNA) injection 234 mg  234 mg Intramuscular Q28 days Caress Reffitt B Allyah Heather, MD      . paliperidone (INVEGA) 24 hr tablet 6 mg  6 mg Oral Daily Satrina Magallanes B Luzmaria Devaux, MD   6 mg at 04/15/15 0903  . simvastatin (ZOCOR) tablet 10 mg  10 mg Oral q1800 Nioma Mccubbins B Shalinda Burkholder, MD   10 mg at 04/15/15 1741  . traZODone (DESYREL) tablet 100 mg  100 mg Oral QHS Shari Prows, MD   100 mg at 04/15/15 2317    Lab Results:  Results for orders placed or performed during the hospital encounter of 04/11/15 (from the past 48 hour(s))  Glucose, capillary     Status: Abnormal   Collection Time: 04/14/15  6:16 AM  Result Value Ref Range   Glucose-Capillary 122 (H) 65 - 99 mg/dL  Glucose, capillary     Status: Abnormal   Collection Time: 04/14/15 12:00 PM  Result Value Ref Range   Glucose-Capillary 156 (H) 65 - 99 mg/dL  Glucose, capillary     Status: Abnormal   Collection Time: 04/14/15  4:31 PM  Result Value Ref Range   Glucose-Capillary 122 (H) 65 - 99 mg/dL  Glucose, capillary     Status: Abnormal   Collection  Time: 04/15/15  5:40 AM  Result Value Ref Range   Glucose-Capillary 123 (H) 65 - 99 mg/dL  Glucose, capillary     Status: Abnormal   Collection Time: 04/15/15 11:43 AM  Result Value Ref Range   Glucose-Capillary 149 (H) 65 - 99 mg/dL  Glucose, capillary     Status: Abnormal   Collection Time: 04/15/15  4:19 PM  Result Value Ref Range   Glucose-Capillary 114 (H) 65 - 99 mg/dL   Comment 1 Notify RN     Physical Findings: AIMS:  , ,  ,  ,  CIWA:    COWS:     Treatment Plan Summary: Daily contact with patient to assess and evaluate symptoms and progress in treatment and Medication management   Medical Decision Making:  Established Problem, Stable/Improving (1), Review of Psycho-Social Stressors (1), Review or order clinical lab tests (1), Review of Medication Regimen & Side Effects (2) and Review of New Medication or Change in Dosage (2)   Greg [name] is a 49 year old male with a history of severe bipolar illness admitted in a manic psychotic episode with low lithium level.  1. Mood and psychosis. We will continue Depakote. Valproic acid level on admission was therapeutic. We will restart lithium. Lithium level on admission was low. We will continue oral Invega. The patient agreed to start Invega sustenna injections on 9/13. Next injection is on Saturday. Anticipated discharge on Monday. Will check lithium level on Sat.  2. Diabetes. We'll continue metformin.  3. Hypertension. We will continue Toprol.  4. Dyslipidemia. We'll continue Zocor.   5. Smoking. Nicotine patch is available.  6 insomnia. We'll continue trazodone.  7. Disposition. He will return to home with his parents. He will follow up with his psychiatrist.     Kristine Linea 04/16/2015, 4:33 AM

## 2015-04-16 NOTE — Plan of Care (Signed)
Problem: Ineffective individual coping Goal: LTG: Patient will report a decrease in negative feelings Outcome: Progressing Patient reports his manic period is over and his is ready to go home, feeling much better.  Goal: STG: Patient will remain free from self harm Outcome: Progressing No self harm.

## 2015-04-17 LAB — GLUCOSE, CAPILLARY: Glucose-Capillary: 117 mg/dL — ABNORMAL HIGH (ref 65–99)

## 2015-04-17 NOTE — Progress Notes (Signed)
   04/17/15 1900  Clinical Encounter Type  Visited With Patient  Visit Type Spiritual support  Referral From Nurse  Consult/Referral To Chaplain  Spiritual Encounters  Spiritual Needs Ritual;Prayer;Sacred text  Stress Factors  Patient Stress Factors Family relationships;Major life changes  Met w/patient who was concerned about returning home and remaining stable. Provided pastoral counsel and education for contemplative prayer to encourage stability. Received confessional.  Melven Sartorius. Nelvin Tomb G. [place]

## 2015-04-17 NOTE — Progress Notes (Signed)
D: patient very pleasant this shift.  Patient denies any SI or HI at this time.  Patient compliant with medications.  Patient seen in the milieu for most of the shift walking and interacting with other patients.  Patient is ready for discharge and knows the after discharge care and follow ups. A: encouragement and support given to patient.  Gave scheduled medications as prescribed.  Went over after care plans with patient  R: patient receptive of care and information given.

## 2015-04-17 NOTE — [facility] Group Notes (Signed)
[facility] LCSW Group Therapy  04/17/2015 3:04 PM  Type of Therapy:  Group Therapy  Participation Level:  Minimal  Participation Quality:  Attentive  Affect:  Flat  Cognitive:  Alert  Insight:  Limited  Engagement in Therapy:  Limited  Modes of Intervention:  Discussion, Education, Socialization and Support  Summary of Progress/Problems: Pt will learn how to identify obstacles, self-sabotaging and enabling behaviors, what they are and why we do them. Pt will discuss unhealthy relationships and how to have positive healthy boundaries with those that sabotage and enable. Pt will identify aspects of self sabotage and enabling in themselves and how to limit these behaviors.  Brenson attended group and stayed the entire time. He sat quietly and listened to other group members.   [employer] MSW, LCSWA  04/17/2015, 3:04 PM

## 2015-04-17 NOTE — Progress Notes (Signed)
Patient cooperative and interactive in the milieu and with his peers. No new needs or distress noted. Reports feeling hopeful about returning home. Reports understanding of med compliance. Able to sleep during the night.

## 2015-04-17 NOTE — Progress Notes (Signed)
[facility] MD Progress Note  04/17/2015 6:13 PM Greg Elsie Ra.  MRN:  [id number]  Subjective:  Greg [name] is a 49 year old male with a history of severe bipolar ilness admitted yet again for psychotic, manic episode in the context of treatment noncompliance. He was seen for follow-up. Patient appeared much calmer and alert today and he also wrote a letter for me requesting his discharge. He reported that he has been doing well on his medications. He does not appear psychotic. He also noted to have a tremor in his right hand which is more pronounced than his left. He reported that he has been having this tremor for long time. He currently denied having any suicidal ideations or plans. He appeared to be more conversant and is able to communicate well.    Principal Problem: Bipolar affective disorder, currently manic, severe, with psychotic features Diagnosis:   Patient Active Problem List   Diagnosis Date Noted  . Schizo-affective psychosis [F25.9] 04/11/2015  . Bipolar affective disorder, manic, severe, with psychotic behavior [F31.2] 04/11/2015  . Suicidal ideation [R45.851] 02/07/2015  . Paranoia [F22]   . Bipolar affective disorder, currently manic, severe, with psychotic features [F31.2] 11/28/2014  . HTN (hypertension) [I10] 11/28/2014  . Dyslipidemia [E78.5] 11/28/2014  . Obesity [E66.9] 11/28/2014  . Diabetes [E11.9] 11/28/2014  . Tobacco use disorder [Z72.0] 11/28/2014   Total Time spent with patient: 20 minutes   Past Medical History:  Past Medical History  Diagnosis Date  . Diabetes   . Obesity   . Dyslipidemia   . HTN (hypertension)   . Schizo affective schizophrenia   . Paranoia   . Bipolar 1 disorder     Past Surgical History  Procedure Laterality Date  . Tonsillectomy    . Appendectomy     Family History: History reviewed. No pertinent family history. Social History:  History  Alcohol Use No     History  Drug Use No    Social History   Social  History  . Marital Status: Single    Spouse Name: N/A  . Number of Children: N/A  . Years of Education: N/A   Social History Main Topics  . Smoking status: Current Every Day Smoker -- 1.00 packs/day    Types: Cigarettes  . Smokeless tobacco: Current User    Types: Snuff  . Alcohol Use: No  . Drug Use: No  . Sexual Activity: Not Asked   Other Topics Concern  . None   Social History Narrative   Additional History:    Sleep: Fair  Appetite:  Good   Assessment:   Musculoskeletal: Strength & Muscle Tone: within normal limits Gait & Station: normal Patient leans: N/A   Psychiatric Specialty Exam: Physical Exam  Nursing note and vitals reviewed.   Review of Systems  Psychiatric/Behavioral: Positive for depression. The patient is nervous/anxious.   All other systems reviewed and are negative.   Blood pressure 139/90, pulse 98, temperature 98.6 F (37 C), temperature source Oral, resp. rate 18, height  (1.778 m), weight 234 lb (106.142 kg), SpO2 99 %.Body mass index is 33.58 kg/(m^2).  General Appearance: Disheveled  Eye Solicitor::  Fair  Speech:  Clear and Coherent  Volume:  Increased  Mood:  Anxious  Affect:  Congruent  Thought Process:  Goal Directed  Orientation:  Full (Time, Place, and Person)  Thought Content:  Delusions, Hallucinations: Auditory and Paranoid Ideation  Suicidal Thoughts:  No  Homicidal Thoughts:  No  Memory:  Immediate;  Fair Recent;   Fair Remote;   Fair  Judgement:  Impaired  Insight:  Fair  Psychomotor Activity:  Increased  Concentration:  Fair  Recall:  [employer] of Knowledge:Fair  Language: Fair  Akathisia:  No  Handed:  Right  AIMS (if indicated):     Assets:  Communication Skills Desire for Improvement Financial Resources/Insurance Housing Physical Health Resilience Social Support  ADL's:  Intact  Cognition: WNL  Sleep:  Number of Hours: 5.25     Current Medications: Current Facility-Administered  Medications  Medication Dose Route Frequency Tatyana Biber Last Rate Last Dose  . acetaminophen (TYLENOL) tablet 650 mg  650 mg Oral Q6H PRN Beau Fanny, MD   650 mg at 04/17/15 1702  . alum & mag hydroxide-simeth (MAALOX/MYLANTA) 200-200-20 MG/5ML suspension 30 mL  30 mL Oral Q4H PRN Beau Fanny, MD      . divalproex (DEPAKOTE) DR tablet 500 mg  500 mg Oral Q12H [name], MD   500 mg at 04/17/15 0915  . lithium carbonate (LITHOBID) CR tablet 600 mg  600 mg Oral Q12H [name], MD   600 mg at 04/17/15 0915  . magnesium hydroxide (MILK OF MAGNESIA) suspension 30 mL  30 mL Oral Daily PRN Beau Fanny, MD      . metFORMIN (GLUCOPHAGE) tablet 1,000 mg  1,000 mg Oral BID WC [name], MD   1,000 mg at 04/17/15 1701  . metoprolol succinate (TOPROL-XL) 24 hr tablet 50 mg  50 mg Oral Daily [name], MD   50 mg at 04/17/15 0915  . nicotine (NICODERM CQ - dosed in mg/24 hours) patch 21 mg  21 mg Transdermal Daily [name], MD   21 mg at 04/17/15 0916  . [START ON 05/13/2015] paliperidone (INVEGA SUSTENNA) injection 234 mg  234 mg Intramuscular Q28 days [name], MD      . paliperidone (INVEGA) 24 hr tablet 6 mg  6 mg Oral Daily [name], MD   6 mg at 04/17/15 0914  . simvastatin (ZOCOR) tablet 10 mg  10 mg Oral q1800 [name], MD   10 mg at 04/17/15 1701  . traZODone (DESYREL) tablet 100 mg  100 mg Oral QHS Shari Prows, MD   100 mg at 04/16/15 2156    Lab Results:  Results for orders placed or performed during the hospital encounter of 04/11/15 (from the past 48 hour(s))  Lithium level     Status: None   Collection Time: 04/16/15  6:00 AM  Result Value Ref Range   Lithium Lvl 0.90 0.60 - 1.20 mmol/L  Valproic acid level     Status: None   Collection Time: 04/16/15  6:00 AM  Result Value Ref Range   Valproic Acid Lvl 58 50.0 - 100.0 ug/mL  Hemoglobin A1c     Status: Abnormal   Collection Time:  04/16/15  6:00 AM  Result Value Ref Range   Hgb A1c MFr Bld 7.3 (H) 4.0 - 6.0 %  Lipid panel     Status: Abnormal   Collection Time: 04/16/15  6:00 AM  Result Value Ref Range   Cholesterol 82 0 - 200 mg/dL   Triglycerides 97 <161 mg/dL   HDL 29 (L) >09 mg/dL   Total CHOL/HDL Ratio 2.8 RATIO   VLDL 19 0 - 40 mg/dL   LDL Cholesterol 34 0 - 99 mg/dL    Comment:  Total Cholesterol/HDL:CHD Risk Coronary Heart Disease Risk Table                     Men   Women  1/2 Average Risk   3.4   3.3  Average Risk       5.0   4.4  2 X Average Risk   9.6   7.1  3 X Average Risk  23.4   11.0        Use the calculated Patient Ratio above and the CHD Risk Table to determine the patient's CHD Risk.        ATP III CLASSIFICATION (LDL):  <100     mg/dL   Optimal  161-096  mg/dL   Near or Above                    Optimal  130-159  mg/dL   Borderline  045-409  mg/dL   High  >811     mg/dL   Very High   TSH     Status: None   Collection Time: 04/16/15  6:00 AM  Result Value Ref Range   TSH 2.735 0.350 - 4.500 uIU/mL  Glucose, capillary     Status: Abnormal   Collection Time: 04/16/15  6:41 AM  Result Value Ref Range   Glucose-Capillary 129 (H) 65 - 99 mg/dL   Comment 1 Notify RN    Comment 2 Document in Chart   Glucose, capillary     Status: Abnormal   Collection Time: 04/16/15 11:43 AM  Result Value Ref Range   Glucose-Capillary 112 (H) 65 - 99 mg/dL  Glucose, capillary     Status: Abnormal   Collection Time: 04/16/15  4:48 PM  Result Value Ref Range   Glucose-Capillary 113 (H) 65 - 99 mg/dL   Comment 1 Notify RN   Glucose, capillary     Status: Abnormal   Collection Time: 04/17/15  6:55 AM  Result Value Ref Range   Glucose-Capillary 117 (H) 65 - 99 mg/dL   Comment 1 Notify RN    Comment 2 Document in Chart     Physical Findings: AIMS:  , ,  ,  ,    CIWA:    COWS:     Treatment Plan Summary: Daily contact with patient to assess and evaluate symptoms and progress in  treatment and Medication management   Medical Decision Making:  Established Problem, Stable/Improving (1), Review of Psycho-Social Stressors (1), Review or order clinical lab tests (1), Review of Medication Regimen & Side Effects (2) and Review of New Medication or Change in Dosage (2)   Greg [name] is a 49 year old male with a history of severe bipolar illness admitted in a manic psychotic episode with low lithium level.  1. Mood and psychosis. We will continue Depakote. Valproic acid level on admission was therapeutic. We will restart lithium. Lithium level on admission was low. We will continue oral Invega. The patient agreed to start Invega sustenna injections on 9/13. Next injection is on Saturday. Anticipated discharge on Monday. Will check lithium level on Sat.  2. Diabetes. We'll continue metformin.  3. Hypertension. We will continue Toprol.  4. Dyslipidemia. We'll continue Zocor.   5. Smoking. Nicotine patch is available.  6 insomnia. We'll continue trazodone.  7. Disposition. He will return to home with his parents. He will follow up with his psychiatrist.     Brandy Hale 04/17/2015, 6:13 PM

## 2015-04-17 NOTE — Plan of Care (Signed)
Problem: Ineffective individual coping Goal: LTG: Patient will report a decrease in negative feelings Outcome: Progressing Patient pleasant this shift and experiencing no SI and is ready for discharge.

## 2015-04-18 LAB — GLUCOSE, CAPILLARY: Glucose-Capillary: 118 mg/dL — ABNORMAL HIGH (ref 65–99)

## 2015-04-18 MED ORDER — PALIPERIDONE ER 6 MG PO TB24: 6.0000 mg | ORAL_TABLET | Freq: Every day | ORAL | Status: DC

## 2015-04-18 MED ORDER — LITHIUM CARBONATE ER 450 MG PO TBCR: 450.0000 mg | EXTENDED_RELEASE_TABLET | Freq: Two times a day (BID) | ORAL | Status: DC

## 2015-04-18 MED ORDER — TRAZODONE HCL 100 MG PO TABS: 100.0000 mg | ORAL_TABLET | Freq: Every day | ORAL | Status: DC

## 2015-04-18 MED ORDER — DIVALPROEX SODIUM 500 MG PO DR TAB: 500.0000 mg | DELAYED_RELEASE_TABLET | Freq: Two times a day (BID) | ORAL | Status: DC

## 2015-04-18 MED ORDER — PALIPERIDONE PALMITATE 234 MG/1.5ML IM SUSP: 234.0000 mg | INTRAMUSCULAR | Status: DC

## 2015-04-18 MED ORDER — LITHIUM CARBONATE ER 300 MG PO TBCR: 600.0000 mg | EXTENDED_RELEASE_TABLET | Freq: Two times a day (BID) | ORAL | Status: DC

## 2015-04-18 MED ORDER — AMANTADINE HCL 100 MG PO CAPS: 100.0000 mg | ORAL_CAPSULE | Freq: Two times a day (BID) | ORAL | Status: DC

## 2015-04-18 NOTE — [facility] Suicide Risk Assessment (Signed)
[facility] Discharge Suicide Risk Assessment   Demographic Factors:  Male and Divorced or widowed  Total Time spent with patient: 30 minutes  Musculoskeletal: Strength & Muscle Tone: within normal limits Gait & Station: normal Patient leans: N/A  Psychiatric Specialty Exam: Physical Exam  Nursing note and vitals reviewed.   Review of Systems  All other systems reviewed and are negative.   Blood pressure 121/82, pulse 101, temperature 98.6 F (37 C), temperature source Oral, resp. rate 18, height  (1.778 m), weight 106.142 kg (234 lb), SpO2 99 %.Body mass index is 33.58 kg/(m^2).  General Appearance: Casual  Eye Contact::  Good  Speech:  Clear and Coherent409  Volume:  Normal  Mood:  Euthymic  Affect:  Appropriate  Thought Process:  Goal Directed  Orientation:  Full (Time, Place, and Person)  Thought Content:  WDL  Suicidal Thoughts:  No  Homicidal Thoughts:  No  Memory:  Immediate;   Fair Recent;   Fair Remote;   Fair  Judgement:  Fair  Insight:  Fair  Psychomotor Activity:  Normal  Concentration:  Fair  Recall:  [employer] of Knowledge:Fair  Language: Fair  Akathisia:  No  Handed:  Right  AIMS (if indicated):     Assets:  Communication Skills Desire for Improvement Financial Resources/Insurance Housing Resilience Social Support  Sleep:  Number of Hours: 0  Cognition: WNL  ADL's:  Intact   Have you used any form of tobacco in the last 30 days? (Cigarettes, Smokeless Tobacco, Cigars, and/or Pipes): Yes  Has this patient used any form of tobacco in the last 30 days? (Cigarettes, Smokeless Tobacco, Cigars, and/or Pipes) Yes, A prescription for an FDA-approved tobacco cessation medication was offered at discharge and the patient refused  Mental Status Per Nursing Assessment::   On Admission:  NA  Current Mental Status by Physician: NA  Loss Factors: NA  Historical Factors: Impulsivity  Risk Reduction Factors:   Sense of responsibility to family,  Living with another person, especially a relative, Positive social support and Positive therapeutic relationship  Continued Clinical Symptoms:  Bipolar Disorder:   Mixed State  Cognitive Features That Contribute To Risk:  None    Suicide Risk:  Minimal: No identifiable suicidal ideation.  Patients presenting with no risk factors but with morbid ruminations; may be classified as minimal risk based on the severity of the depressive symptoms  Principal Problem: Bipolar affective disorder, currently manic, severe, with psychotic features Discharge Diagnoses:  Patient Active Problem List   Diagnosis Date Noted  . Schizo-affective psychosis [F25.9] 04/11/2015  . Bipolar affective disorder, manic, severe, with psychotic behavior [F31.2] 04/11/2015  . Suicidal ideation [R45.851] 02/07/2015  . Paranoia [F22]   . Bipolar affective disorder, currently manic, severe, with psychotic features [F31.2] 11/28/2014  . HTN (hypertension) [I10] 11/28/2014  . Dyslipidemia [E78.5] 11/28/2014  . Obesity [E66.9] 11/28/2014  . Diabetes [E11.9] 11/28/2014  . Tobacco use disorder [Z72.0] 11/28/2014    Follow-up Information    Follow up with [facility]. Go on 04/19/2015.   Why:  For follow-up care appt Tuesday 04/19/15 at 8am  (walkin hours at M-F 8-3)   Contact information:   [address] [place], [place] [phone] Fax [phone]       Plan Of Care/Follow-up recommendations:  Activity:  As tolerated. Diet:  No sodium heart healthy. Other:  He follow-up appointments.  Is patient on multiple antipsychotic therapies at discharge:  No   Has Patient had three or more failed trials of  antipsychotic monotherapy by history:  No  Recommended Plan for Multiple Antipsychotic Therapies: NA    Jolanta Pucilowska 04/18/2015, 10:48 AM

## 2015-04-18 NOTE — Progress Notes (Signed)
  [facility] Adult Case Management Discharge Plan :  Will you be returning to the same living situation after discharge:  Yes,  home with his mother Greg [name] At discharge, do you have transportation home?: Yes,  patient's mother will pick up at discharge Do you have the ability to pay for your medications: Yes,  patient has Medicaid  Release of information consent forms completed and in the chart;  Patient's signature needed at discharge.  Patient to Follow up at: Follow-up Information    Follow up with [facility]. Go on 04/19/2015.   Why:  For follow-up care appt Tuesday 04/19/15 at 8am  (walkin hours at M-F 8-3)   Contact information:   [address] [place], [place] [phone] Fax [phone]       Patient denies SI/HI: Yes,  patient denies SI/HI    Safety Planning and Suicide Prevention discussed: Yes,  SPE discussed with patient and his mother Greg [name]  Have you used any form of tobacco in the last 30 days? (Cigarettes, Smokeless Tobacco, Cigars, and/or Pipes): Yes  Has patient been referred to the Quitline?: Patient refused referral  Lulu Riding, MSW, LCSWA 04/18/2015, 10:24 AM

## 2015-04-18 NOTE — Plan of Care (Signed)
Problem: Ineffective individual coping Goal: STG: Patient will remain free from self harm Outcome: Progressing Pt safe on the unit at this time     

## 2015-04-18 NOTE — Progress Notes (Signed)
Patient denies SI/HI, denies A/V hallucinations. Patient verbalizes understanding of discharge instructions, follow up care and prescriptions. Patient given all belongings from  locker. Patient escorted out by staff, transported by family. 

## 2015-04-18 NOTE — H&P (Signed)
D: Pt denies SI/HI/AVH. Pt is pleasant and cooperative. Pt only focus was on his pain in back and how it could never be fixed. Pt observed on unit interacting with staff and peers.   A: Pt was offered support and encouragement. Pt was given scheduled medications. Pt was encourage to attend groups. Q 15 minute checks were done for safety.   R:Pt attends groups and interacts well with peers and staff. Pt is taking medication. Pt receptive to treatment and safety maintained on unit.

## 2015-04-18 NOTE — Progress Notes (Signed)
Recreation Therapy Notes  INPATIENT RECREATION TR PLAN  Patient Details Name: Greg [name]. MRN: [id number] DOB: 1966-06-08 Today's Date: 04/18/2015  Rec Therapy Plan Is patient appropriate for Therapeutic Recreation?: Yes Treatment times per week: At least once a week TR Treatment/Interventions: 1:1 session, Group participation (Comment) (Appropriate participation in daily recreation therapy tx)  Discharge Criteria Pt will be discharged from therapy if:: Discharged Treatment plan/goals/alternatives discussed and agreed upon by:: Patient/family  Discharge Summary Short term goals set: See Care Plan Short term goals met: Complete Progress toward goals comments: One-to-one attended Which groups?: Coping skills, Leisure education, Self-esteem, Goal setting One-to-one attended: Time management, decision making Reason goals not met: N/A Therapeutic equipment acquired: None Reason patient discharged from therapy: Discharge from hospital Pt/family agrees with progress & goals achieved: Yes Date patient discharged from therapy: 04/18/15   Leon[facility], LRT/CTRS 04/18/2015, 1:25 PM

## 2015-04-18 NOTE — Discharge Summary (Signed)
Physician Discharge Summary Note  Patient:  Greg [name]. is an 49 y.o., male MRN:  [id number] DOB:  11-09-1965 Patient phone:  [phone] (home)  Patient address:   [address] [place] [address],  Total Time spent with patient: 30 minutes  Date of Admission:  04/11/2015 Date of Discharge: 04/18/2015  Reason for Admission:  Manic episode.  Identifying data. [name] is a 49 year old male with a history of bipolar disorder.   Chief complaint. I'm fine.  History of present il Greg [name] has a long history of bipolar illness with multiple hospital admissions for manic episodes. He is last hospitalization was at [facility] in July of this year. He usually does well on a combination of lithium, Depakote, and the [place]. He however refuses injectable anti-psychotics. He was petitioned by his parents for disorganized, paranoid, bizarre behavior. He was wearing American flag and was preoccupied with conspiracy theory about 9/11. The patient was insomniac, restlessness, agitated, with racing thoughts, and paranoid delusions. It is unclear if he were compliant with medications. His Depakote level was therapeutic, Lithium level was very low. The patient denies symptoms of depression, anxiety, or psychosis. He denies alcohol or illicit substance use.  Past psychiatric history. There were multiple hospital admissions and multiple medication trials. There were no suicide attempts.  Family psychiatric history. Grandmother with schizophrenia.  Social history. He dropped out of high school but got his GED's and went to technical college. He is divorced. He is disabled from mental illness. He lives with his parents.   Principal Problem: Bipolar affective disorder, currently manic, severe, with psychotic features Discharge Diagnoses: Patient Active Problem List   Diagnosis Date Noted  . Schizo-affective psychosis [F25.9] 04/11/2015  . Bipolar affective disorder, manic,  severe, with psychotic behavior [F31.2] 04/11/2015  . Suicidal ideation [R45.851] 02/07/2015  . Paranoia [F22]   . Bipolar affective disorder, currently manic, severe, with psychotic features [F31.2] 11/28/2014  . HTN (hypertension) [I10] 11/28/2014  . Dyslipidemia [E78.5] 11/28/2014  . Obesity [E66.9] 11/28/2014  . Diabetes [E11.9] 11/28/2014  . Tobacco use disorder [Z72.0] 11/28/2014    Musculoskeletal: Strength & Muscle Tone: within normal limits Gait & Station: normal Patient leans: N/A  Psychiatric Specialty Exam: Physical Exam  Nursing note and vitals reviewed.   Review of Systems  Neurological: Positive for tremors.  All other systems reviewed and are negative.   Blood pressure 121/82, pulse 101, temperature 98.6 F (37 C), temperature source Oral, resp. rate 18, height  (1.778 m), weight 106.142 kg (234 lb), SpO2 99 %.Body mass index is 33.58 kg/(m^2).  See SRA.                                                  Sleep:  Number of Hours: 0   Have you used any form of tobacco in the last 30 days? (Cigarettes, Smokeless Tobacco, Cigars, and/or Pipes): Yes  Has this patient used any form of tobacco in the last 30 days? (Cigarettes, Smokeless Tobacco, Cigars, and/or Pipes) Yes, A prescription for an FDA-approved tobacco cessation medication was offered at discharge and the patient refused  Past Medical History:  Past Medical History  Diagnosis Date  . Diabetes   . Obesity   . Dyslipidemia   . HTN (hypertension)   . Schizo affective schizophrenia   . Paranoia   .  Bipolar 1 disorder     Past Surgical History  Procedure Laterality Date  . Tonsillectomy    . Appendectomy     Family History: History reviewed. No pertinent family history. Social History:  History  Alcohol Use No     History  Drug Use No    Social History   Social History  . Marital Status: Single    Spouse Name: N/A  . Number of Children: N/A  . Years of  Education: N/A   Social History Main Topics  . Smoking status: Current Every Day Smoker -- 1.00 packs/day    Types: Cigarettes  . Smokeless tobacco: Current User    Types: Snuff  . Alcohol Use: No  . Drug Use: No  . Sexual Activity: Not Asked   Other Topics Concern  . None   Social History Narrative    Past Psychiatric History: Hospitalizations:  Outpatient Care:  Substance Abuse Care:  Self-Mutilation:  Suicidal Attempts:  Violent Behaviors:   Risk to Self: Is patient at risk for suicide?: No Risk to Others:   Prior Inpatient Therapy:   Prior Outpatient Therapy:    Level of Care:  OP  Hospital Course:    Greg [name] is a 49 year old male with a history of severe bipolar illness admitted in a manic psychotic episode with low lithium level.  1. Mood and psychosis. We continued Depakote. VPA 58. We restared lithium. Lithium level on admission was low at discharge 0.9. The patient developed tremors. Lithium dose was lowered from 1200 mg to 900 mg/day. We continued oral Invega. The patient agreed to start Invega sustenna injections indices to initial doses. Next dose of 234 mh on 05/09/2015.   2. Diabetes. We continued metformin. HgbA1C 7.3. Lipid panel was not elevated.  3. Hypertension. We continued Toprol.  4. Dyslipidemia. We continued Zocor.   5. Smoking. Nicotine patch was available.  6 Insomnia. We continued trazodone.  7. Disposition. He was discharged to home with his parents. He will follow up with his psychiatrist at [facility].    Consults:  None  Significant Diagnostic Studies:  None  Discharge Vitals:   Blood pressure 121/82, pulse 101, temperature 98.6 F (37 C), temperature source Oral, resp. rate 18, height  (1.778 m), weight 106.142 kg (234 lb), SpO2 99 %. Body mass index is 33.58 kg/(m^2). Lab Results:   Results for orders placed or performed during the hospital encounter of 04/11/15 (from the past 72 hour(s))  Glucose, capillary      Status: Abnormal   Collection Time: 04/15/15 11:43 AM  Result Value Ref Range   Glucose-Capillary 149 (H) 65 - 99 mg/dL  Glucose, capillary     Status: Abnormal   Collection Time: 04/15/15  4:19 PM  Result Value Ref Range   Glucose-Capillary 114 (H) 65 - 99 mg/dL   Comment 1 Notify RN   Lithium level     Status: None   Collection Time: 04/16/15  6:00 AM  Result Value Ref Range   Lithium Lvl 0.90 0.60 - 1.20 mmol/L  Valproic acid level     Status: None   Collection Time: 04/16/15  6:00 AM  Result Value Ref Range   Valproic Acid Lvl 58 50.0 - 100.0 ug/mL  Hemoglobin A1c     Status: Abnormal   Collection Time: 04/16/15  6:00 AM  Result Value Ref Range   Hgb A1c MFr Bld 7.3 (H) 4.0 - 6.0 %  Lipid panel     Status: Abnormal  Collection Time: 04/16/15  6:00 AM  Result Value Ref Range   Cholesterol 82 0 - 200 mg/dL   Triglycerides 97 <161 mg/dL   HDL 29 (L) >09 mg/dL   Total CHOL/HDL Ratio 2.8 RATIO   VLDL 19 0 - 40 mg/dL   LDL Cholesterol 34 0 - 99 mg/dL    Comment:        Total Cholesterol/HDL:CHD Risk Coronary Heart Disease Risk Table                     Men   Women  1/2 Average Risk   3.4   3.3  Average Risk       5.0   4.4  2 X Average Risk   9.6   7.1  3 X Average Risk  23.4   11.0        Use the calculated Patient Ratio above and the CHD Risk Table to determine the patient's CHD Risk.        ATP III CLASSIFICATION (LDL):  <100     mg/dL   Optimal  604-540  mg/dL   Near or Above                    Optimal  130-159  mg/dL   Borderline  981-191  mg/dL   High  >478     mg/dL   Very High   TSH     Status: None   Collection Time: 04/16/15  6:00 AM  Result Value Ref Range   TSH 2.735 0.350 - 4.500 uIU/mL  Glucose, capillary     Status: Abnormal   Collection Time: 04/16/15  6:41 AM  Result Value Ref Range   Glucose-Capillary 129 (H) 65 - 99 mg/dL   Comment 1 Notify RN    Comment 2 Document in Chart   Glucose, capillary     Status: Abnormal   Collection Time:  04/16/15 11:43 AM  Result Value Ref Range   Glucose-Capillary 112 (H) 65 - 99 mg/dL  Glucose, capillary     Status: Abnormal   Collection Time: 04/16/15  4:48 PM  Result Value Ref Range   Glucose-Capillary 113 (H) 65 - 99 mg/dL   Comment 1 Notify RN   Glucose, capillary     Status: Abnormal   Collection Time: 04/17/15  6:55 AM  Result Value Ref Range   Glucose-Capillary 117 (H) 65 - 99 mg/dL   Comment 1 Notify RN    Comment 2 Document in Chart     Physical Findings: AIMS:  , ,  ,  ,    CIWA:    COWS:      See Psychiatric Specialty Exam and Suicide Risk Assessment completed by Attending Physician prior to discharge.  Discharge destination:  Home  Is patient on multiple antipsychotic therapies at discharge:  No   Has Patient had three or more failed trials of antipsychotic monotherapy by history:  No    Recommended Plan for Multiple Antipsychotic Therapies: NA  Discharge Instructions    Diet - low sodium heart healthy    Complete by:  As directed      Diet - low sodium heart healthy    Complete by:  As directed      Increase activity slowly    Complete by:  As directed      Increase activity slowly    Complete by:  As directed  Medication List    STOP taking these medications        divalproex 500 MG 24 hr tablet  Commonly known as:  DEPAKOTE ER  Replaced by:  divalproex 500 MG DR tablet     nicotine polacrilex 2 MG gum  Commonly known as:  NICORETTE     paliperidone 3 MG 24 hr tablet  Commonly known as:  INVEGA      TAKE these medications      Indication   amantadine 100 MG capsule  Commonly known as:  SYMMETREL  Take 1 capsule (100 mg total) by mouth 2 (two) times daily.   Indication:  Extrapyramidal Reaction caused by Medications     divalproex 500 MG DR tablet  Commonly known as:  DEPAKOTE  Take 1 tablet (500 mg total) by mouth every 12 (twelve) hours.   Indication:  Manic Phase of Manic-Depression     lithium carbonate 450 MG CR  tablet  Commonly known as:  ESKALITH  Take 1 tablet (450 mg total) by mouth every 12 (twelve) hours.   Indication:  Manic-Depression     metFORMIN 1000 MG tablet  Commonly known as:  GLUCOPHAGE  Take 1 tablet (1,000 mg total) by mouth 2 (two) times daily with a meal.   Indication:  Antipsychotic Therapy-Induced Weight Gain, Type 2 Diabetes     metoprolol succinate 50 MG 24 hr tablet  Commonly known as:  TOPROL-XL  Take 1 tablet (50 mg total) by mouth daily. Take with or immediately following a meal.   Indication:  High Blood Pressure     paliperidone 234 MG/1.5ML Susp injection  Commonly known as:  INVEGA SUSTENNA  Inject 234 mg into the muscle every 28 (twenty-eight) days.  Start taking on:  05/13/2015   Indication:  Schizoaffective Disorder     simvastatin 10 MG tablet  Commonly known as:  ZOCOR  Take 1 tablet (10 mg total) by mouth at bedtime.   Indication:  Cerebrovascular Accident or Stroke, Inherited Heterozygous Hypercholesterolemia     traZODone 100 MG tablet  Commonly known as:  DESYREL  Take 1 tablet (100 mg total) by mouth at bedtime.   Indication:  Trouble Sleeping           Follow-up Information    Follow up with [facility] on 04/19/2015.   Why:  For follow-up care appt Tuesday 04/19/15 at 8am  (walkin hours at M-F 8-3)   Contact information:   [address] [place], [place] Ph [phone] Fax [phone]       Follow-up recommendations:  Activity:  as tolerated. Diet:  low sodium heart healthy ADA diet. Other:  keep follow-up appointments.  Comments:    Total Discharge Time: 35 min.  Signed: Kristine Linea 04/18/2015, 10:51 AM

## 2015-04-20 MED ORDER — METOPROLOL SUCCINATE ER 50 MG PO TB24: 50.0000 mg | ORAL_TABLET | Freq: Every day | ORAL | Status: DC

## 2015-04-20 MED ORDER — METFORMIN HCL 1000 MG PO TABS: 1000.0000 mg | ORAL_TABLET | Freq: Two times a day (BID) | ORAL | Status: DC

## 2015-04-20 MED ORDER — SIMVASTATIN 10 MG PO TABS: 10.0000 mg | ORAL_TABLET | Freq: Every day | ORAL | Status: DC

## 2015-04-22 DIAGNOSIS — F250 Schizoaffective disorder, bipolar type: Secondary | ICD-10-CM | POA: Diagnosis not present

## 2015-04-22 DIAGNOSIS — F319 Bipolar disorder, unspecified: Secondary | ICD-10-CM | POA: Diagnosis not present

## 2015-05-12 DIAGNOSIS — F250 Schizoaffective disorder, bipolar type: Secondary | ICD-10-CM | POA: Diagnosis not present

## 2015-05-12 DIAGNOSIS — F319 Bipolar disorder, unspecified: Secondary | ICD-10-CM | POA: Diagnosis not present

## 2015-06-09 DIAGNOSIS — F319 Bipolar disorder, unspecified: Secondary | ICD-10-CM | POA: Diagnosis not present

## 2015-06-21 DIAGNOSIS — F250 Schizoaffective disorder, bipolar type: Secondary | ICD-10-CM | POA: Diagnosis not present

## 2015-06-21 DIAGNOSIS — F319 Bipolar disorder, unspecified: Secondary | ICD-10-CM | POA: Diagnosis not present

## 2015-07-07 DIAGNOSIS — F319 Bipolar disorder, unspecified: Secondary | ICD-10-CM | POA: Diagnosis not present

## 2015-07-07 DIAGNOSIS — F250 Schizoaffective disorder, bipolar type: Secondary | ICD-10-CM | POA: Diagnosis not present

## 2015-08-04 DIAGNOSIS — F250 Schizoaffective disorder, bipolar type: Secondary | ICD-10-CM | POA: Diagnosis not present

## 2015-08-04 DIAGNOSIS — F319 Bipolar disorder, unspecified: Secondary | ICD-10-CM | POA: Diagnosis not present

## 2015-08-10 DIAGNOSIS — F319 Bipolar disorder, unspecified: Secondary | ICD-10-CM | POA: Diagnosis not present

## 2015-08-10 DIAGNOSIS — F250 Schizoaffective disorder, bipolar type: Secondary | ICD-10-CM | POA: Diagnosis not present

## 2015-08-27 ENCOUNTER — Emergency Department
Admission: EM | Admit: 2015-08-27 | Discharge: 2015-08-28 | Disposition: A | Discharge status: Home / Self Care | Payer: Medicare Other | Attending: Emergency Medicine | Admitting: Emergency Medicine

## 2015-08-27 DIAGNOSIS — F99 Mental disorder, not otherwise specified: Secondary | ICD-10-CM | POA: Diagnosis not present

## 2015-08-27 DIAGNOSIS — F29 Unspecified psychosis not due to a substance or known physiological condition: Secondary | ICD-10-CM | POA: Diagnosis not present

## 2015-08-27 DIAGNOSIS — F17210 Nicotine dependence, cigarettes, uncomplicated: Secondary | ICD-10-CM | POA: Insufficient documentation

## 2015-08-27 DIAGNOSIS — F0391 Unspecified dementia with behavioral disturbance: Secondary | ICD-10-CM | POA: Diagnosis not present

## 2015-08-27 DIAGNOSIS — Z79899 Other long term (current) drug therapy: Secondary | ICD-10-CM | POA: Diagnosis not present

## 2015-08-27 DIAGNOSIS — I10 Essential (primary) hypertension: Secondary | ICD-10-CM | POA: Insufficient documentation

## 2015-08-27 DIAGNOSIS — Z7984 Long term (current) use of oral hypoglycemic drugs: Secondary | ICD-10-CM | POA: Insufficient documentation

## 2015-08-27 DIAGNOSIS — F319 Bipolar disorder, unspecified: Secondary | ICD-10-CM | POA: Diagnosis not present

## 2015-08-27 DIAGNOSIS — E1165 Type 2 diabetes mellitus with hyperglycemia: Secondary | ICD-10-CM | POA: Insufficient documentation

## 2015-08-27 DIAGNOSIS — F259 Schizoaffective disorder, unspecified: Secondary | ICD-10-CM | POA: Diagnosis not present

## 2015-08-27 LAB — COMPREHENSIVE METABOLIC PANEL
ALT: 17 U/L (ref 17–63)
AST: 19 U/L (ref 15–41)
Albumin: 3.8 g/dL (ref 3.5–5.0)
Alkaline Phosphatase: 82 U/L (ref 38–126)
Anion gap: 10 (ref 5–15)
BUN: 21 mg/dL — ABNORMAL HIGH (ref 6–20)
CO2: 23 mmol/L (ref 22–32)
Calcium: 9.1 mg/dL (ref 8.9–10.3)
Chloride: 97 mmol/L — ABNORMAL LOW (ref 101–111)
Creatinine, Ser: 0.91 mg/dL (ref 0.61–1.24)
GFR calc Af Amer: >60 mL/min (ref >60)
GFR calc non Af Amer: >60 mL/min (ref >60)
Glucose, Bld: 378 mg/dL — ABNORMAL HIGH (ref 65–99)
Potassium: 4 mmol/L (ref 3.5–5.1)
Sodium: 130 mmol/L — ABNORMAL LOW (ref 135–145)
Total Bilirubin: 0.9 mg/dL (ref 0.3–1.2)
Total Protein: 7.1 g/dL (ref 6.5–8.1)

## 2015-08-27 LAB — CBC WITH DIFFERENTIAL/PLATELET
Basophils Absolute: 0.1 10*3/uL (ref 0–0.1)
Basophils Relative: 1 %
Eosinophils Absolute: 0.8 10*3/uL — ABNORMAL HIGH (ref 0–0.7)
Eosinophils Relative: 8 %
HCT: 46.3 % (ref 40.0–52.0)
Hemoglobin: 15.6 g/dL (ref 13.0–18.0)
Lymphocytes Relative: 36 %
Lymphs Abs: 3.7 10*3/uL — ABNORMAL HIGH (ref 1.0–3.6)
MCH: 28.1 pg (ref 26.0–34.0)
MCHC: 33.8 g/dL (ref 32.0–36.0)
MCV: 83.2 fL (ref 80.0–100.0)
Monocytes Absolute: 0.7 10*3/uL (ref 0.2–1.0)
Monocytes Relative: 7 %
Neutro Abs: 4.9 10*3/uL (ref 1.4–6.5)
Neutrophils Relative %: 48 %
Platelets: 227 10*3/uL (ref 150–440)
RBC: 5.56 MIL/uL (ref 4.40–5.90)
RDW: 13.2 % (ref 11.5–14.5)
WBC: 10.3 10*3/uL (ref 3.8–10.6)

## 2015-08-27 LAB — VALPROIC ACID LEVEL: Valproic Acid Lvl: 32 ug/mL — ABNORMAL LOW (ref 50.0–100.0)

## 2015-08-27 LAB — LITHIUM LEVEL: Lithium Lvl: <0.06 mmol/L — ABNORMAL LOW (ref 0.60–1.20)

## 2015-08-27 LAB — ACETAMINOPHEN LEVEL: Acetaminophen (Tylenol), Serum: <10 ug/mL — ABNORMAL LOW (ref 10–30)

## 2015-08-27 LAB — SALICYLATE LEVEL: Salicylate Lvl: <4 mg/dL (ref 2.8–30.0)

## 2015-08-27 LAB — ETHANOL: Alcohol, Ethyl (B): <5 mg/dL (ref <5)

## 2015-08-27 MED ORDER — SODIUM CHLORIDE 0.9 % IV BOLUS (SEPSIS)
1000.0000 mL | Freq: Once | INTRAVENOUS | Status: AC
  Administered 2015-08-27: 1000 mL via INTRAVENOUS

## 2015-08-27 NOTE — ED Notes (Signed)
Pt with history of scizoaffective disorder. Pt states has had increased thirst and urination. Pt states his parents locked him out of house this pm and he "had to pee". Pt states he was yelling to be let in, he states he thinks he scared his mother with his yelling and she called ems to have him transported to ed for psych evaluation. Pt with hyperglycemia with ems of 469. Pt denies HI or SI, pt alert and oriented x4, pt with htn, tachycardia, calm and cooperative.

## 2015-08-27 NOTE — ED Provider Notes (Addendum)
[facility] Emergency Department Provider Note  ____________________________________________   I have reviewed the triage vital signs and the nursing notes.   HISTORY  Chief Complaint Hyperglycemia    HPI Kapono Luhn is a 50 y.o. male presents today complaining of getting agitated. I did discuss with his mother, he states that he has been off his meds and sleeping cycles are disrupted, and he is becoming more and more agitated and angry towards them. He is threatening them telling them that they will go to hell, yelling and screaming. Patient admits to being noncompliant with his lithium. He states that it makes his thumb twitch and he doesn't like the way it makes him feel. He states it keeps him up at night. He has been having difficulty sleeping for the last few days. He states that he has not had any SI or HI.Patient takes oral hypoglycemic agents but has not been checking his sugars. His parents also state he is not compliant with his blood pressure medication. They connected him to go regular doctor. They have a house or him across the street but he is too paranoid to sleep. He normally sleeps at there has been does not have a key because they do not trust him with it. They state that he has been becoming more and more agitated. He was not locked out of the house as he indicated when he first arrived according to the family.  Past Medical History  Diagnosis Date  . Diabetes   . Obesity   . Dyslipidemia   . HTN (hypertension)   . Schizo affective schizophrenia   . Paranoia   . Bipolar 1 disorder     Patient Active Problem List   Diagnosis Date Noted  . Schizo-affective psychosis (HCC) 04/11/2015  . Bipolar affective disorder, manic, severe, with psychotic behavior (HCC) 04/11/2015  . Suicidal ideation 02/07/2015  . Paranoia (HCC)   . Bipolar affective disorder, currently manic, severe, with psychotic features (HCC) 11/28/2014  . HTN (hypertension)  11/28/2014  . Dyslipidemia 11/28/2014  . Obesity 11/28/2014  . Diabetes (HCC) 11/28/2014  . Tobacco use disorder 11/28/2014    Past Surgical History  Procedure Laterality Date  . Tonsillectomy    . Appendectomy      Current Outpatient Rx  Name  Route  Sig  Dispense  Refill  . amantadine (SYMMETREL) 100 MG capsule   Oral   Take 1 capsule (100 mg total) by mouth 2 (two) times daily.   60 capsule   0   . divalproex (DEPAKOTE) 500 MG DR tablet   Oral   Take 1 tablet (500 mg total) by mouth every 12 (twelve) hours.   60 tablet   0   . lithium carbonate (ESKALITH) 450 MG CR tablet   Oral   Take 1 tablet (450 mg total) by mouth every 12 (twelve) hours.   60 tablet   0   . metFORMIN (GLUCOPHAGE) 1000 MG tablet   Oral   Take 1 tablet (1,000 mg total) by mouth 2 (two) times daily with a meal.   60 tablet   0   . metoprolol succinate (TOPROL-XL) 50 MG 24 hr tablet   Oral   Take 1 tablet (50 mg total) by mouth daily. Take with or immediately following a meal.   30 tablet   0   . paliperidone (INVEGA SUSTENNA) 234 MG/1.5ML SUSP injection   Intramuscular   Inject 234 mg into the muscle every 28 (twenty-eight) days.  0.9 mL   1   . simvastatin (ZOCOR) 10 MG tablet   Oral   Take 1 tablet (10 mg total) by mouth at bedtime.   30 tablet   0   . traZODone (DESYREL) 100 MG tablet   Oral   Take 1 tablet (100 mg total) by mouth at bedtime.   30 tablet   0     Allergies Haldol; Risperdal; and Seroquel  No family history on file.  Social History Social History  Substance Use Topics  . Smoking status: Current Every Day Smoker -- 1.00 packs/day    Types: Cigarettes  . Smokeless tobacco: Current User    Types: Snuff  . Alcohol Use: No    Review of Systems Constitutional: No fever/chills Eyes: No visual changes. ENT: No sore throat. No stiff neck no neck pain Cardiovascular: Denies chest pain. Respiratory: Denies shortness of breath. Gastrointestinal:   no  vomiting.  No diarrhea.  No constipation. Genitourinary: Negative for dysuria. Musculoskeletal: Negative lower extremity swelling Skin: Negative for rash. Neurological: Negative for headaches, focal weakness or numbness. 10-point ROS otherwise negative.  ____________________________________________   PHYSICAL EXAM:  VITAL SIGNS: ED Triage Vitals  Enc Vitals Group     BP 08/27/15 2115 176/108 mmHg     Pulse Rate 08/27/15 2115 106     Resp 08/27/15 2115 18     Temp 08/27/15 2115 98.8 F (37.1 C)     Temp Source 08/27/15 2115 Oral     SpO2 08/27/15 2115 100 %     Weight 08/27/15 2115 265 lb (120.203 kg)     Height 08/27/15 2115  (1.778 m)     Head Cir --      Peak Flow --      Pain Score 08/27/15 2207 0     Pain Loc --      Pain Edu? --      Excl. in GC? --     Constitutional: Alert and oriented. Well appearing and in no acute distress. Eyes: Conjunctivae are normal. PERRL. EOMI. Head: Atraumatic. Nose: No congestion/rhinnorhea. Mouth/Throat: Mucous membranes are moist.  Oropharynx non-erythematous. Neck: No stridor.   Nontender with no meningismus Cardiovascular: Normal rate, regular rhythm. Grossly normal heart sounds.  Good peripheral circulation. Respiratory: Normal respiratory effort.  No retractions. Lungs CTAB. Abdominal: Soft and nontender. No distention. No guarding no rebound Back:  There is no focal tenderness or step off there is no midline tenderness there are no lesions noted. there is no CVA tenderness Musculoskeletal: No lower extremity tenderness. No joint effusions, no DVT signs strong distal pulses no edema Neurologic:  Normal speech and language. No gross focal neurologic deficits are appreciated.  Skin:  Skin is warm, dry and intact. No rash noted. Psychiatric: Mood and affect are slightly bizarre but not psychotic. Speech and behavior are normal.  ____________________________________________   LABS (all labs ordered are listed, but only  abnormal results are displayed)  Labs Reviewed  LITHIUM LEVEL - Abnormal; Notable for the following:    Lithium Lvl <0.06 (*)    All other components within normal limits  COMPREHENSIVE METABOLIC PANEL - Abnormal; Notable for the following:    Sodium 130 (*)    Chloride 97 (*)    Glucose, Bld 378 (*)    BUN 21 (*)    All other components within normal limits  ACETAMINOPHEN LEVEL - Abnormal; Notable for the following:    Acetaminophen (Tylenol), Serum <10 (*)    All other components  within normal limits  CBC WITH DIFFERENTIAL/PLATELET - Abnormal; Notable for the following:    Lymphs Abs 3.7 (*)    Eosinophils Absolute 0.8 (*)    All other components within normal limits  BLOOD GAS, VENOUS - Abnormal; Notable for the following:    pCO2, Ven 41 (*)    All other components within normal limits  VALPROIC ACID LEVEL - Abnormal; Notable for the following:    Valproic Acid Lvl 32 (*)    All other components within normal limits  ETHANOL  SALICYLATE LEVEL   ____________________________________________  EKG  I personally interpreted any EKGs ordered by me or triage Sinus rhythm Nonspecific T abnormalities, lateral leads Baseline wander in lead(s) I III aVL Rate 98 ____________________________________________  RADIOLOGY  I reviewed any imaging ordered by me or triage that were performed during my shift ____________________________________________   PROCEDURES  Procedure(s) performed: None  Critical Care performed: None  ____________________________________________   INITIAL IMPRESSION / ASSESSMENT AND PLAN / ED COURSE  Pertinent labs & imaging results that were available during my care of the patient were reviewed by me and considered in my medical decision making (see chart for details).  Patient sent here for evaluation of increasingly bizarre behavior at home. He does have a history of schizo affective disorder. He has become more paranoid and irrational according  to family but threatening speech towards them. He is here voluntarily and he states he would rather not be IVC that he would stay here for voluntary assessment. I did explain to him that that is fine as long as he does consent to stay but he does try to leave we will IVC him. I do feel that it is important the patient received psychiatric care for his deteriorating mental status as as described by his parents in the context of medical noncompliance. ____________________________________________   FINAL CLINICAL IMPRESSION(S) / ED DIAGNOSES  Final diagnoses:  None      This chart was dictated using voice recognition software.  Despite best efforts to proofread,  errors can occur which can change meaning.     Jeanmarie Plant, MD 08/27/15 2332  Jeanmarie Plant, MD 09/07/15 [phone]

## 2015-08-28 LAB — GLUCOSE, CAPILLARY: Glucose-Capillary: 294 mg/dL — ABNORMAL HIGH (ref 65–99)

## 2015-08-28 MED ORDER — AMANTADINE HCL 100 MG PO CAPS
100.0000 mg | ORAL_CAPSULE | Freq: Two times a day (BID) | ORAL | Status: DC
  Administered 2015-08-28 (×3): 100 mg via ORAL
  Filled 2015-08-28 (×3): qty 1

## 2015-08-28 MED ORDER — DIVALPROEX SODIUM 500 MG PO DR TAB
500.0000 mg | DELAYED_RELEASE_TABLET | Freq: Two times a day (BID) | ORAL | Status: DC
  Administered 2015-08-28 (×3): 500 mg via ORAL
  Filled 2015-08-28 (×3): qty 1

## 2015-08-28 MED ORDER — LORAZEPAM 2 MG PO TABS
ORAL_TABLET | ORAL | Status: AC
  Administered 2015-08-28: 2 mg via ORAL
  Filled 2015-08-28: qty 1

## 2015-08-28 MED ORDER — HYDROXYZINE HCL 25 MG PO TABS
50.0000 mg | ORAL_TABLET | Freq: Once | ORAL | Status: AC
  Administered 2015-08-28: 50 mg via ORAL
  Filled 2015-08-28: qty 2

## 2015-08-28 MED ORDER — METOPROLOL SUCCINATE ER 50 MG PO TB24
50.0000 mg | ORAL_TABLET | Freq: Every day | ORAL | Status: DC
  Administered 2015-08-28: 50 mg via ORAL
  Filled 2015-08-28: qty 1

## 2015-08-28 MED ORDER — LORAZEPAM 2 MG PO TABS
2.0000 mg | ORAL_TABLET | Freq: Three times a day (TID) | ORAL | Status: DC | PRN
  Administered 2015-08-28 (×2): 2 mg via ORAL
  Filled 2015-08-28: qty 1

## 2015-08-28 MED ORDER — LITHIUM CARBONATE ER 450 MG PO TBCR
450.0000 mg | EXTENDED_RELEASE_TABLET | Freq: Two times a day (BID) | ORAL | Status: DC
  Administered 2015-08-28 (×3): 450 mg via ORAL
  Filled 2015-08-28 (×2): qty 1

## 2015-08-28 MED ORDER — LITHIUM CARBONATE ER 450 MG PO TBCR
EXTENDED_RELEASE_TABLET | ORAL | Status: AC
  Administered 2015-08-28: 450 mg via ORAL
  Filled 2015-08-28: qty 1

## 2015-08-28 MED ORDER — METFORMIN HCL 500 MG PO TABS
1000.0000 mg | ORAL_TABLET | Freq: Two times a day (BID) | ORAL | Status: DC
  Administered 2015-08-28 (×2): 1000 mg via ORAL
  Filled 2015-08-28 (×2): qty 2

## 2015-08-28 MED ORDER — SIMVASTATIN 10 MG PO TABS
10.0000 mg | ORAL_TABLET | Freq: Every day | ORAL | Status: DC
  Administered 2015-08-28 (×2): 10 mg via ORAL
  Filled 2015-08-28 (×2): qty 1

## 2015-08-28 NOTE — ED Notes (Signed)
Patient asleep in room. No noted distress or abnormal behavior. Will continue 15 minute checks and observation by security cameras for safety. 

## 2015-08-28 NOTE — Discharge Instructions (Signed)

## 2015-08-28 NOTE — ED Provider Notes (Signed)
Patient seen and cleared for discharge by psychiatry. Patient currently back on his metformin.  He will follow up closely with monitor his outpatient provider.  Sharyn Creamer, MD 08/28/15 2155

## 2015-08-28 NOTE — ED Provider Notes (Signed)
-----------------------------------------   6:40 AM on 08/28/2015 -----------------------------------------   Blood pressure 158/97, pulse 106, temperature 98.8 F (37.1 C), temperature source Oral, resp. rate 22, height  (1.778 m), weight 265 lb (120.203 kg), SpO2 95 %.  The patient had no acute events since last update.  Vistaril ordered for patient appearing to be responding to internal stimuli. Home medications were ordered to continue in the emergency department. Calm and cooperative at this time.  Disposition is pending per Psychiatry/Behavioral Medicine team recommendations.     Irean Hong, MD 08/28/15 [phone]

## 2015-08-28 NOTE — ED Notes (Signed)
Called [facility] for consult [phone]  Notified at approximately 1410 of order

## 2015-08-28 NOTE — ED Notes (Signed)
ENVIRONMENTAL ASSESSMENT  Potentially harmful objects out of patient reach: Yes.  Personal belongings secured: Yes.  Patient dressed in hospital provided attire only: Yes.  Plastic bags out of patient reach: Yes.  Patient care equipment (cords, cables, call bells, lines, and drains) shortened, removed, or accounted for: Yes.  Equipment and supplies removed from bottom of stretcher: Yes.  Potentially toxic materials out of patient reach: Yes.  Sharps container removed or out of patient reach: Yes.  BEHAVIORAL HEALTH ROUNDING  Patient sleeping: No.  Patient alert and oriented: yes  Behavior appropriate: Yes. ; If no, describe:  Nutrition and fluids offered: Yes  Toileting and hygiene offered: Yes  Sitter present: not applicable, Q 15 min safety rounds and observation via security camera. Law enforcement present: Yes ODS  ED BHU PLACEMENT JUSTIFICATION  Is the patient under IVC or is there intent for IVC: Yes. WAITING PAPERS TO BE DISCONTINUED FOR DISCHARGE   Is the patient medically cleared: Yes.  Is there vacancy in the ED BHU: Yes.  Is the population mix appropriate for patient: Yes.  Is the patient awaiting placement in inpatient or outpatient setting: Yes.  Has the patient had a psychiatric consult: Yes.  Survey of unit performed for contraband, proper placement and condition of furniture, tampering with fixtures in bathroom, shower, and each patient room: Yes. ; Findings: All clear  APPEARANCE/BEHAVIOR  calm, cooperative and adequate rapport can be established  NEURO ASSESSMENT  Orientation: time, place and person  Hallucinations: No.None noted (Hallucinations)  Speech: Normal  Gait: normal  RESPIRATORY ASSESSMENT  WNL  CARDIOVASCULAR ASSESSMENT  WNL  GASTROINTESTINAL ASSESSMENT  WNL  EXTREMITIES  WNL  PLAN OF CARE  Provide calm/safe environment. Vital signs assessed twice daily. ED BHU Assessment once each 12-hour shift. Collaborate with intake RN daily or as condition  indicates. Assure the ED provider has rounded once each shift. Provide and encourage hygiene. Provide redirection as needed. Assess for escalating behavior; address immediately and inform ED provider.  Assess family dynamic and appropriateness for visitation as needed: Yes. ; If necessary, describe findings:  Educate the patient/family about BHU procedures/visitation: Yes. ; If necessary, describe findings: Pt is calm and cooperative at this time. Pt understanding and accepting of unit procedures/rules. Will continue to monitor with Q 15 min safety rounds and observation via security camera.

## 2015-08-28 NOTE — ED Notes (Signed)
The patient states "The holy spirit inside of me is very agitated."  When asked when this agitation started, patient responded, "it's has been this way for a while now".

## 2015-08-28 NOTE — ED Notes (Signed)
Patient awakened for medications and tele psych interview.  In no apparent distress at present. Maintained on 15 minute checks and observation by security camera for safety.

## 2015-08-28 NOTE — ED Notes (Signed)
Patient spoke to [facility] via telephone.  Patient has been in good behavioral control. No gross mania or psychosis noted. Maintained on 15 minute checks and observation by security camera for safety.

## 2015-08-28 NOTE — ED Notes (Signed)
Patient verbally agitated when recounting details which resulted in ED visit. Blaming parents, threatening to speak in tongues, accusing nurse of being judgmental " only the lord can judge." Denying psychiatric issues.  Case discussed with MD. Lorazepam was ordered as a prn dose to reduce agitation and assist with behavioral control.

## 2015-08-28 NOTE — ED Notes (Signed)
Patient states The [facility] is very powerful, more powerful than Bipolar.  God is in control now.  God is in control"

## 2015-08-28 NOTE — ED Notes (Signed)

## 2015-08-28 NOTE — ED Notes (Signed)
Patient briefly awake. He took remainder of ordered morning medications, then went back to bed. Maintained on 15 minute checks and observation by security camera for safety.

## 2015-08-28 NOTE — ED Notes (Addendum)
Pt is alert and oriented but does seem to be responding to internal stimuli at times. Pt denies SI/HI and AVH but has delayed response time and affect is somewhat bizarre. Writer requested medications for psychosis, hypertention and hyperglycemia as well as medication reconciliation. Pt is pleasant and cooperative with staff and went to sleep after arriving on the unit. 15 minute checks initiated for safety.   Sleep apnea noted throughout the night.

## 2015-09-01 DIAGNOSIS — F250 Schizoaffective disorder, bipolar type: Secondary | ICD-10-CM | POA: Diagnosis not present

## 2015-09-01 DIAGNOSIS — F319 Bipolar disorder, unspecified: Secondary | ICD-10-CM | POA: Diagnosis not present

## 2015-09-01 LAB — BLOOD GAS, VENOUS
Acid-Base Excess: 1.9 mmol/L (ref 0.0–3.0)
Bicarbonate: 26.6 mEq/L (ref 21.0–28.0)
Patient temperature: 37
pCO2, Ven: 41 mmHg — ABNORMAL LOW (ref 44.0–60.0)
pH, Ven: 7.42 (ref 7.320–7.430)

## 2015-09-27 DIAGNOSIS — F250 Schizoaffective disorder, bipolar type: Secondary | ICD-10-CM | POA: Diagnosis not present

## 2015-09-27 DIAGNOSIS — F319 Bipolar disorder, unspecified: Secondary | ICD-10-CM | POA: Diagnosis not present

## 2015-10-06 ENCOUNTER — Inpatient Hospital Stay
Admission: RE | Admit: 2015-10-06 | Discharge: 2015-10-12 | DRG: 885 | Disposition: A | Discharge status: Home / Self Care | Payer: Medicare Other | Source: Intra-hospital | Attending: Psychiatry | Admitting: Psychiatry

## 2015-10-06 ENCOUNTER — Emergency Department
Admission: EM | Admit: 2015-10-06 | Discharge: 2015-10-06 | Disposition: A | Discharge status: Other Acute Inpatient Hospital | Payer: Medicare Other | Attending: Emergency Medicine | Admitting: Emergency Medicine

## 2015-10-06 ENCOUNTER — Encounter: Payer: Self-pay | Admitting: *Deleted

## 2015-10-06 DIAGNOSIS — Z818 Family history of other mental and behavioral disorders: Secondary | ICD-10-CM | POA: Diagnosis not present

## 2015-10-06 DIAGNOSIS — G250 Essential tremor: Secondary | ICD-10-CM | POA: Diagnosis present

## 2015-10-06 DIAGNOSIS — E871 Hypo-osmolality and hyponatremia: Secondary | ICD-10-CM

## 2015-10-06 DIAGNOSIS — F250 Schizoaffective disorder, bipolar type: Secondary | ICD-10-CM | POA: Diagnosis not present

## 2015-10-06 DIAGNOSIS — E785 Hyperlipidemia, unspecified: Secondary | ICD-10-CM | POA: Diagnosis not present

## 2015-10-06 DIAGNOSIS — I10 Essential (primary) hypertension: Secondary | ICD-10-CM | POA: Diagnosis present

## 2015-10-06 DIAGNOSIS — E119 Type 2 diabetes mellitus without complications: Secondary | ICD-10-CM | POA: Diagnosis not present

## 2015-10-06 DIAGNOSIS — F489 Nonpsychotic mental disorder, unspecified: Secondary | ICD-10-CM | POA: Diagnosis present

## 2015-10-06 DIAGNOSIS — Z7984 Long term (current) use of oral hypoglycemic drugs: Secondary | ICD-10-CM | POA: Diagnosis not present

## 2015-10-06 DIAGNOSIS — F17200 Nicotine dependence, unspecified, uncomplicated: Secondary | ICD-10-CM | POA: Diagnosis present

## 2015-10-06 DIAGNOSIS — Z79899 Other long term (current) drug therapy: Secondary | ICD-10-CM | POA: Diagnosis not present

## 2015-10-06 DIAGNOSIS — F309 Manic episode, unspecified: Secondary | ICD-10-CM | POA: Diagnosis not present

## 2015-10-06 DIAGNOSIS — E669 Obesity, unspecified: Secondary | ICD-10-CM | POA: Insufficient documentation

## 2015-10-06 DIAGNOSIS — R21 Rash and other nonspecific skin eruption: Secondary | ICD-10-CM | POA: Diagnosis present

## 2015-10-06 DIAGNOSIS — F17210 Nicotine dependence, cigarettes, uncomplicated: Secondary | ICD-10-CM | POA: Diagnosis not present

## 2015-10-06 DIAGNOSIS — F312 Bipolar disorder, current episode manic severe with psychotic features: Secondary | ICD-10-CM | POA: Diagnosis not present

## 2015-10-06 DIAGNOSIS — G251 Drug-induced tremor: Secondary | ICD-10-CM | POA: Diagnosis present

## 2015-10-06 DIAGNOSIS — F259 Schizoaffective disorder, unspecified: Secondary | ICD-10-CM | POA: Insufficient documentation

## 2015-10-06 DIAGNOSIS — F3181 Bipolar II disorder: Secondary | ICD-10-CM | POA: Insufficient documentation

## 2015-10-06 DIAGNOSIS — Z9889 Other specified postprocedural states: Secondary | ICD-10-CM

## 2015-10-06 DIAGNOSIS — G4700 Insomnia, unspecified: Secondary | ICD-10-CM | POA: Diagnosis present

## 2015-10-06 DIAGNOSIS — Z888 Allergy status to other drugs, medicaments and biological substances status: Secondary | ICD-10-CM | POA: Diagnosis not present

## 2015-10-06 DIAGNOSIS — Z9049 Acquired absence of other specified parts of digestive tract: Secondary | ICD-10-CM

## 2015-10-06 DIAGNOSIS — Z9114 Patient's other noncompliance with medication regimen: Secondary | ICD-10-CM

## 2015-10-06 LAB — COMPREHENSIVE METABOLIC PANEL
ALT: 33 U/L (ref 17–63)
AST: 56 U/L — ABNORMAL HIGH (ref 15–41)
Albumin: 3.7 g/dL (ref 3.5–5.0)
Alkaline Phosphatase: 54 U/L (ref 38–126)
Anion gap: 11 (ref 5–15)
BUN: 15 mg/dL (ref 6–20)
CO2: 21 mmol/L — ABNORMAL LOW (ref 22–32)
Calcium: 9.6 mg/dL (ref 8.9–10.3)
Chloride: 95 mmol/L — ABNORMAL LOW (ref 101–111)
Creatinine, Ser: 0.95 mg/dL (ref 0.61–1.24)
GFR calc Af Amer: >60 mL/min (ref >60)
GFR calc non Af Amer: >60 mL/min (ref >60)
Glucose, Bld: 233 mg/dL — ABNORMAL HIGH (ref 65–99)
Potassium: 4 mmol/L (ref 3.5–5.1)
Sodium: 127 mmol/L — ABNORMAL LOW (ref 135–145)
Total Bilirubin: 1 mg/dL (ref 0.3–1.2)
Total Protein: 7.3 g/dL (ref 6.5–8.1)

## 2015-10-06 LAB — ACETAMINOPHEN LEVEL: Acetaminophen (Tylenol), Serum: <10 ug/mL — ABNORMAL LOW (ref 10–30)

## 2015-10-06 LAB — URINE DRUG SCREEN, QUALITATIVE (ARMC ONLY)
Amphetamines, Ur Screen: NOT DETECTED
Barbiturates, Ur Screen: NOT DETECTED
Benzodiazepine, Ur Scrn: NOT DETECTED
Cannabinoid 50 Ng, Ur ~~LOC~~: NOT DETECTED
Cocaine Metabolite,Ur ~~LOC~~: NOT DETECTED
MDMA (Ecstasy)Ur Screen: NOT DETECTED
Methadone Scn, Ur: NOT DETECTED
Opiate, Ur Screen: NOT DETECTED
Phencyclidine (PCP) Ur S: NOT DETECTED
Tricyclic, Ur Screen: NOT DETECTED

## 2015-10-06 LAB — GLUCOSE, CAPILLARY: Glucose-Capillary: 234 mg/dL — ABNORMAL HIGH (ref 65–99)

## 2015-10-06 LAB — ETHANOL: Alcohol, Ethyl (B): <5 mg/dL (ref <5)

## 2015-10-06 LAB — VALPROIC ACID LEVEL: Valproic Acid Lvl: 44 ug/mL — ABNORMAL LOW (ref 50.0–100.0)

## 2015-10-06 LAB — CBC
HCT: 41.2 % (ref 40.0–52.0)
Hemoglobin: 14.1 g/dL (ref 13.0–18.0)
MCH: 27.9 pg (ref 26.0–34.0)
MCHC: 34.1 g/dL (ref 32.0–36.0)
MCV: 81.6 fL (ref 80.0–100.0)
Platelets: 212 10*3/uL (ref 150–440)
RBC: 5.05 MIL/uL (ref 4.40–5.90)
RDW: 13.5 % (ref 11.5–14.5)
WBC: 7.4 10*3/uL (ref 3.8–10.6)

## 2015-10-06 LAB — LITHIUM LEVEL: Lithium Lvl: 0.59 mmol/L — ABNORMAL LOW (ref 0.60–1.20)

## 2015-10-06 LAB — SALICYLATE LEVEL: Salicylate Lvl: <4 mg/dL (ref 2.8–30.0)

## 2015-10-06 MED ORDER — TEMAZEPAM 15 MG PO CAPS
30.0000 mg | ORAL_CAPSULE | Freq: Every day | ORAL | Status: DC
Start: 2015-10-06 — End: 2015-10-06

## 2015-10-06 MED ORDER — METOPROLOL SUCCINATE ER 25 MG PO TB24
50.0000 mg | ORAL_TABLET | Freq: Every day | ORAL | Status: DC
  Administered 2015-10-08 – 2015-10-12 (×5): 50 mg via ORAL
  Filled 2015-10-06 (×6): qty 2

## 2015-10-06 MED ORDER — ALUM & MAG HYDROXIDE-SIMETH 200-200-20 MG/5ML PO SUSP
30.0000 mL | ORAL | Status: DC | PRN
  Administered 2015-10-07 – 2015-10-08 (×3): 30 mL via ORAL
  Filled 2015-10-06 (×3): qty 30

## 2015-10-06 MED ORDER — PROPRANOLOL HCL 20 MG PO TABS
20.0000 mg | ORAL_TABLET | Freq: Two times a day (BID) | ORAL | Status: DC
  Filled 2015-10-06 (×2): qty 1

## 2015-10-06 MED ORDER — DIVALPROEX SODIUM 500 MG PO DR TAB
500.0000 mg | DELAYED_RELEASE_TABLET | Freq: Two times a day (BID) | ORAL | Status: DC
  Administered 2015-10-06 (×2): 500 mg via ORAL
  Filled 2015-10-06 (×2): qty 1

## 2015-10-06 MED ORDER — TRAZODONE HCL 100 MG PO TABS
100.0000 mg | ORAL_TABLET | Freq: Every day | ORAL | Status: DC
  Administered 2015-10-06: 100 mg via ORAL
  Filled 2015-10-06: qty 1

## 2015-10-06 MED ORDER — CLONAZEPAM 1 MG PO TABS
1.0000 mg | ORAL_TABLET | Freq: Three times a day (TID) | ORAL | Status: DC | PRN
  Administered 2015-10-06: 1 mg via ORAL
  Filled 2015-10-06: qty 1

## 2015-10-06 MED ORDER — METFORMIN HCL 500 MG PO TABS
1000.0000 mg | ORAL_TABLET | Freq: Two times a day (BID) | ORAL | Status: DC
  Administered 2015-10-07 – 2015-10-12 (×12): 1000 mg via ORAL
  Filled 2015-10-06 (×2): qty 2
  Filled 2015-10-06: qty 1
  Filled 2015-10-06 (×10): qty 2

## 2015-10-06 MED ORDER — NICOTINE 21 MG/24HR TD PT24
21.0000 mg | MEDICATED_PATCH | Freq: Every day | TRANSDERMAL | Status: DC
  Administered 2015-10-07 – 2015-10-09 (×3): 21 mg via TRANSDERMAL
  Filled 2015-10-06 (×5): qty 1

## 2015-10-06 MED ORDER — TRIHEXYPHENIDYL HCL 2 MG PO TABS
2.0000 mg | ORAL_TABLET | Freq: Every day | ORAL | Status: DC
  Filled 2015-10-06: qty 1

## 2015-10-06 MED ORDER — PROPRANOLOL HCL 20 MG PO TABS
20.0000 mg | ORAL_TABLET | Freq: Two times a day (BID) | ORAL | Status: DC
  Administered 2015-10-06 (×2): 20 mg via ORAL
  Filled 2015-10-06 (×2): qty 1

## 2015-10-06 MED ORDER — CALCIUM CARBONATE ANTACID 500 MG PO CHEW
1.0000 | CHEWABLE_TABLET | Freq: Once | ORAL | Status: AC
  Administered 2015-10-06: 200 mg via ORAL
  Filled 2015-10-06 (×2): qty 1

## 2015-10-06 MED ORDER — TRAZODONE HCL 100 MG PO TABS
100.0000 mg | ORAL_TABLET | Freq: Every day | ORAL | Status: DC
  Filled 2015-10-06: qty 1

## 2015-10-06 MED ORDER — CLONAZEPAM 1 MG PO TABS: 1.0000 mg | ORAL_TABLET | Freq: Three times a day (TID) | ORAL | Status: DC | PRN

## 2015-10-06 MED ORDER — SIMVASTATIN 20 MG PO TABS
10.0000 mg | ORAL_TABLET | Freq: Every day | ORAL | Status: DC
  Administered 2015-10-07 – 2015-10-11 (×5): 10 mg via ORAL
  Filled 2015-10-06 (×5): qty 1

## 2015-10-06 MED ORDER — LITHIUM CARBONATE ER 450 MG PO TBCR
450.0000 mg | EXTENDED_RELEASE_TABLET | Freq: Two times a day (BID) | ORAL | Status: DC
  Administered 2015-10-06 (×2): 450 mg via ORAL
  Filled 2015-10-06 (×3): qty 1

## 2015-10-06 MED ORDER — NICOTINE 21 MG/24HR TD PT24: 21.0000 mg | MEDICATED_PATCH | Freq: Every day | TRANSDERMAL | Status: DC

## 2015-10-06 MED ORDER — METOPROLOL SUCCINATE ER 50 MG PO TB24
50.0000 mg | ORAL_TABLET | Freq: Every day | ORAL | Status: DC
  Administered 2015-10-06: 50 mg via ORAL
  Filled 2015-10-06: qty 1

## 2015-10-06 MED ORDER — LITHIUM CARBONATE ER 450 MG PO TBCR
450.0000 mg | EXTENDED_RELEASE_TABLET | Freq: Two times a day (BID) | ORAL | Status: DC
  Administered 2015-10-07 – 2015-10-12 (×11): 450 mg via ORAL
  Filled 2015-10-06 (×11): qty 1

## 2015-10-06 MED ORDER — AMANTADINE HCL 100 MG PO CAPS
100.0000 mg | ORAL_CAPSULE | Freq: Two times a day (BID) | ORAL | Status: DC
  Administered 2015-10-07 – 2015-10-12 (×11): 100 mg via ORAL
  Filled 2015-10-06 (×11): qty 1

## 2015-10-06 MED ORDER — TRIHEXYPHENIDYL HCL 2 MG PO TABS
2.0000 mg | ORAL_TABLET | Freq: Every day | ORAL | Status: DC
  Administered 2015-10-06 – 2015-10-10 (×5): 2 mg via ORAL
  Filled 2015-10-06 (×6): qty 1

## 2015-10-06 MED ORDER — DIVALPROEX SODIUM 500 MG PO DR TAB
500.0000 mg | DELAYED_RELEASE_TABLET | Freq: Two times a day (BID) | ORAL | Status: DC
  Administered 2015-10-07 – 2015-10-10 (×8): 500 mg via ORAL
  Filled 2015-10-06 (×8): qty 1

## 2015-10-06 MED ORDER — MAGNESIUM HYDROXIDE 400 MG/5ML PO SUSP
30.0000 mL | Freq: Every day | ORAL | Status: DC | PRN
  Administered 2015-10-07: 30 mL via ORAL
  Filled 2015-10-06: qty 30

## 2015-10-06 MED ORDER — AMANTADINE HCL 100 MG PO CAPS
100.0000 mg | ORAL_CAPSULE | Freq: Two times a day (BID) | ORAL | Status: DC
  Administered 2015-10-06 (×2): 100 mg via ORAL
  Filled 2015-10-06 (×2): qty 1

## 2015-10-06 MED ORDER — METFORMIN HCL 500 MG PO TABS
1000.0000 mg | ORAL_TABLET | Freq: Two times a day (BID) | ORAL | Status: DC
  Filled 2015-10-06: qty 2

## 2015-10-06 MED ORDER — SIMVASTATIN 10 MG PO TABS
10.0000 mg | ORAL_TABLET | Freq: Every day | ORAL | Status: DC
Start: 2015-10-06 — End: 2015-10-06
  Filled 2015-10-06 (×2): qty 1

## 2015-10-06 MED ORDER — ACETAMINOPHEN 325 MG PO TABS: 650.0000 mg | ORAL_TABLET | Freq: Four times a day (QID) | ORAL | Status: DC | PRN

## 2015-10-06 NOTE — Consult Note (Signed)
[place] Psychiatry Consult   Reason for Consult:  Consult for 50 year old man with schizoaffective disorder. He presented to the emergency room file law enforcement. They picked him up for acting bizarre in public. Referring Physician:  Clearnce Hasten Patient Identification: Greg [name] MRN:  [id number] Principal Diagnosis: Schizo-affective psychosis [facility]) Diagnosis:   Patient Active Problem List   Diagnosis Date Noted  . Hyponatremia [E87.1] 10/06/2015  . Rash and nonspecific skin eruption [R21] 10/06/2015  . Schizo-affective psychosis ([place]) [F25.9] 04/11/2015  . Bipolar affective disorder, manic, severe, with psychotic behavior ([place]) [F31.2] 04/11/2015  . Suicidal ideation [R45.851] 02/07/2015  . Paranoia ([place]) [F22]   . Bipolar affective disorder, currently manic, severe, with psychotic features ([place]) [F31.2] 11/28/2014  . HTN (hypertension) [I10] 11/28/2014  . Dyslipidemia [E78.5] 11/28/2014  . Obesity [E66.9] 11/28/2014  . Diabetes ([place]) [E11.9] 11/28/2014  . Tobacco use disorder [F17.200] 11/28/2014    Total Time spent with patient: 1 hour  Subjective:   Greg [name] is a 50 y.o. male patient admitted with "I was having a party".  HPI:  Information from the patient and the chart. Patient interviewed. Reviewed old notes. Reviewed current labs. Patient was brought in by police who found him acting bizarre in public. Patient tells me that for the last 3 days he has been having a party at his house which is to celebrate the wedding of Rica Mast. God told him to do this in his mind. He reports that he was taking all of his belongings and furniture out into the yard and street and also that he was urinating in public. Patient admits he has not been sleeping well in days. Not been eating normally. He has been drinking excessive amounts of fluid including soft drinks and water. Patient claims he has been taking his medicines but basically says he takes them when he feels  like it rather than on a specific schedule. He says he has not seen anyone at [facility] since he was here in the hospital last. Denies any drug or alcohol use. Patient has delusions and bizarre behavior. He was pretty well behaved talking to me though he was quite hyper religious but he showed a lot of bizarre behavior and a little bit of slightly aggressive behavior towards the TTS worker.  Social history: Patient lives with his mother. Chronic mental illness. Not able to work. Limited social activity.  Medical history: Patient is obese and has gained weight even since the last time he was here. He has a history of diabetes and high blood pressure dyslipidemia. On this admission we note by his labs that he has a low sodium probably from excessive water drinking. Also he has a diffuse rash over the front side of his body and his arms and hands.  Substance abuse history: Patient talks about marijuana but then tells me that he doesn't use any and hasn't used any drugs. Doesn't drink alcohol very often. No major substance abuse issue.  Past Psychiatric History: Past history of long-standing schizophrenia multiple hospitalizations. Here recently in our hospital. In the past has resisted long-acting injectables although I believe there was a plan to switch him over to long-acting in [place] on his last admission. He also is supposed to be taking lithium and valproic acid and Klonopin. I don't believe he has an actual history of suicide attempts in the past.  Risk to Self: Suicidal Ideation: No Suicidal Intent: No Is patient at risk for suicide?: No Suicidal Plan?: No Access to Means: No What  has been your use of drugs/alcohol within the last 12 months?: None Reported  How many times?: 0 Triggers for Past Attempts: Other (Comment) (None ) Intentional Self Injurious Behavior: None Risk to Others: Homicidal Ideation: No Thoughts of Harm to Others: No Current Homicidal Intent: No Current Homicidal Plan:  No Access to Homicidal Means: No Identified Victim:  (N/A) History of harm to others?: Yes (Pt reports in the Distant past ) Assessment of Violence: In distant past Violent Behavior Description: Fighting others Does patient have access to weapons?: No Criminal Charges Pending?: Yes Describe Pending Criminal Charges:  (Pt states that he has a riffle but refused to specify where ) Does patient have a court date: No Prior Inpatient Therapy: Prior Inpatient Therapy: Yes Prior Therapy Dates: [phone] Prior Therapy Facilty/Provider(s): [facility] Reason for Treatment: Manic Bx Prior Outpatient Therapy: Prior Outpatient Therapy: Yes Prior Therapy Dates: Current  Prior Therapy Facilty/Provider(s): [facility]  Reason for Treatment: Schizoaffective Disorder  Does patient have an ACCT team?: No Does patient have Intensive In-House Services?  : No Does patient have [facility] services? : No Does patient have P4CC services?: No  Past Medical History:  Past Medical History  Diagnosis Date  . Diabetes ([place])   . Obesity   . Dyslipidemia   . HTN (hypertension)   . Schizo affective schizophrenia ([place])   . Paranoia ([place])   . Bipolar 1 disorder [facility])     Past Surgical History  Procedure Laterality Date  . Tonsillectomy    . Appendectomy     Family History: No family history on file. Family Psychiatric  History: Grandmother had schizophrenia and he says his grandfathers had alcohol abuse Social History:  History  Alcohol Use No     History  Drug Use No    Social History   Social History  . Marital Status: Single    Spouse Name: N/A  . Number of Children: N/A  . Years of Education: N/A   Social History Main Topics  . Smoking status: Current Every Day Smoker -- 1.00 packs/day    Types: Cigarettes  . Smokeless tobacco: Current User    Types: Snuff  . Alcohol Use: No  . Drug Use: No  . Sexual Activity: Not Asked   Other Topics Concern  . None   Social History Narrative   Additional  Social History:    Allergies:   Allergies  Allergen Reactions  . Haldol [Haloperidol] Anaphylaxis  . Risperdal [Risperidone] Other (See Comments)    Reaction: unknown   . Seroquel [Quetiapine Fumarate] Other (See Comments)    Reaction: unknown     Labs:  Results for orders placed or performed during the hospital encounter of 10/06/15 (from the past 48 hour(s))  Comprehensive metabolic panel     Status: Abnormal   Collection Time: 10/06/15  7:12 AM  Result Value Ref Range   Sodium 127 (L) 135 - 145 mmol/L   Potassium 4.0 3.5 - 5.1 mmol/L   Chloride 95 (L) 101 - 111 mmol/L   CO2 21 (L) 22 - 32 mmol/L   Glucose, Bld 233 (H) 65 - 99 mg/dL   BUN 15 6 - 20 mg/dL   Creatinine, Ser 0.95 0.61 - 1.24 mg/dL   Calcium 9.6 8.9 - 10.3 mg/dL   Total Protein 7.3 6.5 - 8.1 g/dL   Albumin 3.7 3.5 - 5.0 g/dL   AST 56 (H) 15 - 41 U/L   ALT 33 17 - 63 U/L   Alkaline Phosphatase 54 38 -  126 U/L   Total Bilirubin 1.0 0.3 - 1.2 mg/dL   GFR calc non Af Amer >60 >60 mL/min   GFR calc Af Amer >60 >60 mL/min    Comment: (NOTE) The eGFR has been calculated using the CKD EPI equation. This calculation has not been validated in all clinical situations. eGFR's persistently <60 mL/min signify possible Chronic Kidney Disease.    Anion gap 11 5 - 15  Ethanol (ETOH)     Status: None   Collection Time: 10/06/15  7:12 AM  Result Value Ref Range   Alcohol, Ethyl (B) <5 <5 mg/dL    Comment:        LOWEST DETECTABLE LIMIT FOR SERUM ALCOHOL IS 5 mg/dL FOR MEDICAL PURPOSES ONLY   Salicylate level     Status: None   Collection Time: 10/06/15  7:12 AM  Result Value Ref Range   Salicylate Lvl <1.4 2.8 - 30.0 mg/dL  Acetaminophen level     Status: Abnormal   Collection Time: 10/06/15  7:12 AM  Result Value Ref Range   Acetaminophen (Tylenol), Serum <10 (L) 10 - 30 ug/mL    Comment:        THERAPEUTIC CONCENTRATIONS VARY SIGNIFICANTLY. A RANGE OF 10-30 ug/mL MAY BE AN EFFECTIVE CONCENTRATION FOR MANY  PATIENTS. HOWEVER, SOME ARE BEST TREATED AT CONCENTRATIONS OUTSIDE THIS RANGE. ACETAMINOPHEN CONCENTRATIONS >150 ug/mL AT 4 HOURS AFTER INGESTION AND >50 ug/mL AT 12 HOURS AFTER INGESTION ARE OFTEN ASSOCIATED WITH TOXIC REACTIONS.   CBC     Status: None   Collection Time: 10/06/15  7:12 AM  Result Value Ref Range   WBC 7.4 3.8 - 10.6 K/uL   RBC 5.05 4.40 - 5.90 MIL/uL   Hemoglobin 14.1 13.0 - 18.0 g/dL   HCT 41.2 40.0 - 52.0 %   MCV 81.6 80.0 - 100.0 fL   MCH 27.9 26.0 - 34.0 pg   MCHC 34.1 32.0 - 36.0 g/dL   RDW 13.5 11.5 - 14.5 %   Platelets 212 150 - 440 K/uL  Valproic acid level     Status: Abnormal   Collection Time: 10/06/15  7:12 AM  Result Value Ref Range   Valproic Acid Lvl 44 (L) 50.0 - 100.0 ug/mL  Lithium level     Status: Abnormal   Collection Time: 10/06/15  7:12 AM  Result Value Ref Range   Lithium Lvl 0.59 (L) 0.60 - 1.20 mmol/L  Urine Drug Screen, Qualitative ([facility] only)     Status: None   Collection Time: 10/06/15  7:13 AM  Result Value Ref Range   Tricyclic, Ur Screen NONE DETECTED NONE DETECTED   Amphetamines, Ur Screen NONE DETECTED NONE DETECTED   MDMA (Ecstasy)Ur Screen NONE DETECTED NONE DETECTED   Cocaine Metabolite,Ur [place] NONE DETECTED NONE DETECTED   Opiate, Ur Screen NONE DETECTED NONE DETECTED   Phencyclidine (PCP) Ur S NONE DETECTED NONE DETECTED   Cannabinoid 50 Ng, Ur [place] NONE DETECTED NONE DETECTED   Barbiturates, Ur Screen NONE DETECTED NONE DETECTED   Benzodiazepine, Ur Scrn NONE DETECTED NONE DETECTED   Methadone Scn, Ur NONE DETECTED NONE DETECTED    Comment: (NOTE) 970  Tricyclics, urine               Cutoff 1000 ng/mL 200  Amphetamines, urine             Cutoff 1000 ng/mL 300  MDMA (Ecstasy), urine           Cutoff 500 ng/mL 400  Cocaine Metabolite,  urine       Cutoff 300 ng/mL 500  Opiate, urine                   Cutoff 300 ng/mL 600  Phencyclidine (PCP), urine      Cutoff 25 ng/mL 700  Cannabinoid, urine              Cutoff 50  ng/mL 800  Barbiturates, urine             Cutoff 200 ng/mL 900  Benzodiazepine, urine           Cutoff 200 ng/mL 1000 Methadone, urine                Cutoff 300 ng/mL 1100 1200 The urine drug screen provides only a preliminary, unconfirmed 1300 analytical test result and should not be used for non-medical 1400 purposes. Clinical consideration and professional judgment should 1500 be applied to any positive drug screen result due to possible 1600 interfering substances. A more specific alternate chemical method 1700 must be used in order to obtain a confirmed analytical result.  1800 Gas chromato graphy / mass spectrometry (GC/MS) is the preferred 1900 confirmatory method.     Current Facility-Administered Medications  Medication Dose Route Frequency Provider Last Rate Last Dose  . amantadine (SYMMETREL) capsule 100 mg  100 mg Oral BID Gonzella Lex, MD      . clonazePAM (KLONOPIN) tablet 1 mg  1 mg Oral TID PRN Gonzella Lex, MD      . divalproex (DEPAKOTE) DR tablet 500 mg  500 mg Oral Q12H Gonzella Lex, MD      . lithium carbonate (ESKALITH) CR tablet 450 mg  450 mg Oral Q12H Gonzella Lex, MD      . metFORMIN (GLUCOPHAGE) tablet 1,000 mg  1,000 mg Oral BID WC Gonzella Lex, MD      . metoprolol succinate (TOPROL-XL) 24 hr tablet 50 mg  50 mg Oral Daily Gonzella Lex, MD      . propranolol (INDERAL) tablet 20 mg  20 mg Oral BID Gonzella Lex, MD      . simvastatin (ZOCOR) tablet 10 mg  10 mg Oral q1800 Gonzella Lex, MD      . traZODone (DESYREL) tablet 100 mg  100 mg Oral QHS Gonzella Lex, MD      . trihexyphenidyl (ARTANE) tablet 2 mg  2 mg Oral QHS Gonzella Lex, MD       Current Outpatient Prescriptions  Medication Sig Dispense Refill  . amantadine (SYMMETREL) 100 MG capsule Take 1 capsule (100 mg total) by mouth 2 (two) times daily. (Patient taking differently: Take 100 mg by mouth 3 (three) times daily. ) 60 capsule 0  . clonazePAM (KLONOPIN) 1 MG tablet Take 1  mg by mouth every 8 (eight) hours as needed for anxiety.     . divalproex (DEPAKOTE) 500 MG DR tablet Take 1 tablet (500 mg total) by mouth every 12 (twelve) hours. (Patient taking differently: Take 500 mg by mouth 2 (two) times daily. ) 60 tablet 0  . lithium carbonate (ESKALITH) 450 MG CR tablet Take 1 tablet (450 mg total) by mouth every 12 (twelve) hours. (Patient taking differently: Take 450 mg by mouth 2 (two) times daily. ) 60 tablet 0  . paliperidone (INVEGA SUSTENNA) 234 MG/1.5ML SUSP injection Inject 234 mg into the muscle every 28 (twenty-eight) days. 0.9 mL 1  . propranolol (INDERAL) 20 MG tablet  Take 20 mg by mouth 2 (two) times daily.     . traZODone (DESYREL) 100 MG tablet Take 1 tablet (100 mg total) by mouth at bedtime. 30 tablet 0  . trihexyphenidyl (ARTANE) 2 MG tablet Take 2 mg by mouth at bedtime.    . metFORMIN (GLUCOPHAGE) 1000 MG tablet Take 1 tablet (1,000 mg total) by mouth 2 (two) times daily with a meal. (Patient not taking: Reported on 10/06/2015) 60 tablet 0  . metoprolol succinate (TOPROL-XL) 50 MG 24 hr tablet Take 1 tablet (50 mg total) by mouth daily. Take with or immediately following a meal. (Patient not taking: Reported on 10/06/2015) 30 tablet 0  . simvastatin (ZOCOR) 10 MG tablet Take 1 tablet (10 mg total) by mouth at bedtime. (Patient not taking: Reported on 10/06/2015) 30 tablet 0    Musculoskeletal: Strength & Muscle Tone: within normal limits Gait & Station: normal Patient leans: N/A  Psychiatric Specialty Exam: Review of Systems  Constitutional: Negative.   HENT: Negative.   Eyes: Negative.   Respiratory: Negative.   Cardiovascular: Negative.   Gastrointestinal: Negative.   Musculoskeletal: Negative.   Skin: Positive for rash.  Neurological: Negative.   Psychiatric/Behavioral: Positive for hallucinations. Negative for depression, suicidal ideas, memory loss and substance abuse. The patient is nervous/anxious and has insomnia.     Blood pressure  178/85, pulse 126, temperature 98.4 F (36.9 C), temperature source Oral, resp. rate 20, height 5' 10"  (1.778 m), weight 120.203 kg (265 lb), SpO2 98 %.Body mass index is 38.02 kg/(m^2).  General Appearance: Disheveled  Eye Sport and exercise psychologist::  Fair  Speech:  Garbled  Volume:  Normal  Mood:  Euphoric  Affect:  Labile  Thought Process:  Disorganized  Orientation:  Full (Time, Place, and Person)  Thought Content:  Delusions, Hallucinations: Auditory and Ideas of Reference:   Delusions  Suicidal Thoughts:  No  Homicidal Thoughts:  No  Memory:  Immediate;   Good Recent;   Fair Remote;   Fair  Judgement:  Impaired  Insight:  Shallow  Psychomotor Activity:  Restlessness  Concentration:  Fair  Recall:  Poor  Fund of Knowledge:Fair  Language: Poor  Akathisia:  No  Handed:  Right  AIMS (if indicated):     Assets:  Financial Resources/Insurance Housing Resilience Social Support  ADL's:  Intact  Cognition: WNL  Sleep:      Treatment Plan Summary: Daily contact with patient to assess and evaluate symptoms and progress in treatment, Medication management and Plan patient was schizoaffective disorder who presents as psychotic. Although there is not a specific way in which she is threatening to harm himself or others he appears to probably be noncompliant with medicine. Lithium and valproic levels are low. May not of gotten a shot anytime soon. Certainly decompensated and psychotic and not functioning normally. I propose we admitted him back to the hospital. Continue precautions. Continue current medicine as prescribed. Recheck levels recheck sodium. Check his thyroid check his hemoglobin A1c TSH and prolactin. Engage him in groups and educational opportunities on the unit. I'm not sure what is causing the rash. It looks to me like it could be a drug rash but he denies that he's made any recent changes so I'm not going to immediately change anything for now.  Disposition: Recommend psychiatric Inpatient  admission when medically cleared.  Alethia Berthold, MD 10/06/2015 1:34 PM

## 2015-10-06 NOTE — ED Notes (Signed)
Pt here via police, voluntary pt was preparing for a wedding for " Greg [name]", moving things for the wedding onto the front lawn, pt cooperative, denies HI,SI

## 2015-10-06 NOTE — ED Notes (Signed)
Lunch provided along with an extra drink  - pt has been seen by psychiatry and the plan is to admit him to [facility] when staff/bed become available

## 2015-10-06 NOTE — Progress Notes (Signed)
[facility] by Dr. Toni Amendlapacs.  Attending Physician will be Dr. Jennet MaduroPucilowska.   Patient [facility] Victorino DikeJennifer.   Intake Paper Work has been signed and placed on patient chart.  ER staff is aware of the admission [facility]( [facility].; Dr. Derrill KayGoodman, ER MD; Amy Patient's Nurse & [place] NailIsabel Patient Access).   10/06/2015 Cheryl FlashNicole Kealey Kemmer, MS, NCC, LPCA Therapeutic Triage Specialist

## 2015-10-06 NOTE — ED Notes (Signed)
BEHAVIORAL HEALTH ROUNDING Patient sleeping: No. Patient alert and oriented: yes Behavior appropriate: Yes.  ; If no, describe:  Nutrition and fluids offered: yes Toileting and hygiene offered: Yes  Sitter present: q15 minute observations and security  monitoring Law enforcement present: Yes  ODS  

## 2015-10-06 NOTE — ED Notes (Signed)

## 2015-10-06 NOTE — ED Provider Notes (Signed)
[facility] Emergency Department Provider Note  ____________________________________________  Time seen: Approximately 7:42 AM  I have reviewed the triage vital signs and the nursing notes.   HISTORY  Chief Complaint Mental Health Problem    HPI Greg [name] is a 50 y.o. male with a history of schizoaffective disorder, paranoia and bipolar disorder was presenting to the emergency department today for odd behavior. He was brought in by police for moving things for "[place] Spears'" wetting onto his front lawn. The patient says that "the Shaune Pollack spoke to me today to tell me there was a wedding." He says that he also "interprets tongues." He says that he has been compliant with his medications. He is denying any suicidal or homicidal ideation.   Past Medical History  Diagnosis Date  . Diabetes (HCC)   . Obesity   . Dyslipidemia   . HTN (hypertension)   . Schizo affective schizophrenia (HCC)   . Paranoia (HCC)   . Bipolar 1 disorder [facility])     Patient Active Problem List   Diagnosis Date Noted  . Schizo-affective psychosis (HCC) 04/11/2015  . Bipolar affective disorder, manic, severe, with psychotic behavior (HCC) 04/11/2015  . Suicidal ideation 02/07/2015  . Paranoia (HCC)   . Bipolar affective disorder, currently manic, severe, with psychotic features (HCC) 11/28/2014  . HTN (hypertension) 11/28/2014  . Dyslipidemia 11/28/2014  . Obesity 11/28/2014  . Diabetes (HCC) 11/28/2014  . Tobacco use disorder 11/28/2014    Past Surgical History  Procedure Laterality Date  . Tonsillectomy    . Appendectomy      Current Outpatient Rx  Name  Route  Sig  Dispense  Refill  . amantadine (SYMMETREL) 100 MG capsule   Oral   Take 1 capsule (100 mg total) by mouth 2 (two) times daily. Patient taking differently: Take 100 mg by mouth 3 (three) times daily.    60 capsule   0   . clonazePAM (KLONOPIN) 1 MG tablet   Oral   Take 1 mg by mouth every 8  (eight) hours as needed for anxiety.          . divalproex (DEPAKOTE) 500 MG DR tablet   Oral   Take 1 tablet (500 mg total) by mouth every 12 (twelve) hours. Patient taking differently: Take 500 mg by mouth 2 (two) times daily.    60 tablet   0   . lithium carbonate (ESKALITH) 450 MG CR tablet   Oral   Take 1 tablet (450 mg total) by mouth every 12 (twelve) hours. Patient taking differently: Take 450 mg by mouth 2 (two) times daily.    60 tablet   0   . paliperidone (INVEGA SUSTENNA) 234 MG/1.5ML SUSP injection   Intramuscular   Inject 234 mg into the muscle every 28 (twenty-eight) days.   0.9 mL   1   . propranolol (INDERAL) 20 MG tablet   Oral   Take 20 mg by mouth 2 (two) times daily.          . traZODone (DESYREL) 100 MG tablet   Oral   Take 1 tablet (100 mg total) by mouth at bedtime.   30 tablet   0   . trihexyphenidyl (ARTANE) 2 MG tablet   Oral   Take 2 mg by mouth at bedtime.         . metFORMIN (GLUCOPHAGE) 1000 MG tablet   Oral   Take 1 tablet (1,000 mg total) by mouth 2 (two) times daily with a  meal. Patient not taking: Reported on 10/06/2015   60 tablet   0   . metoprolol succinate (TOPROL-XL) 50 MG 24 hr tablet   Oral   Take 1 tablet (50 mg total) by mouth daily. Take with or immediately following a meal. Patient not taking: Reported on 10/06/2015   30 tablet   0   . simvastatin (ZOCOR) 10 MG tablet   Oral   Take 1 tablet (10 mg total) by mouth at bedtime. Patient not taking: Reported on 10/06/2015   30 tablet   0     Allergies Haldol; Risperdal; and Seroquel  No family history on file.  Social History Social History  Substance Use Topics  . Smoking status: Current Every Day Smoker -- 1.00 packs/day    Types: Cigarettes  . Smokeless tobacco: Current User    Types: Snuff  . Alcohol Use: No    Review of Systems Constitutional: No fever/chills Eyes: No visual changes. ENT: No sore throat. Cardiovascular: Denies chest  pain. Respiratory: Denies shortness of breath. Gastrointestinal: No abdominal pain.  No nausea, no vomiting.  No diarrhea.  No constipation. Genitourinary: Negative for dysuria. Musculoskeletal: Negative for back pain. Skin: Negative for rash. Neurological: Negative for headaches, focal weakness or numbness.  10-point ROS otherwise negative.  ____________________________________________   PHYSICAL EXAM:  VITAL SIGNS: ED Triage Vitals  Enc Vitals Group     BP 10/06/15 0710 178/85 mmHg     Pulse Rate 10/06/15 0710 126     Resp 10/06/15 0710 20     Temp 10/06/15 0710 98.4 F (36.9 C)     Temp Source 10/06/15 0710 Oral     SpO2 10/06/15 0710 98 %     Weight 10/06/15 0710 265 lb (120.203 kg)     Height 10/06/15 0710 5\' 10"  (1.778 m)     Head Cir --      Peak Flow --      Pain Score --      Pain Loc --      Pain Edu? --      Excl. in GC? --     Constitutional: Alert and oriented. Well appearing and in no acute distress. Eyes: Conjunctivae are normal. PERRL. EOMI. Head: Atraumatic. Nose: No congestion/rhinnorhea. Mouth/Throat: Mucous membranes are moist.   Neck: No stridor.   Cardiovascular: Tachycardic, regular rhythm. Grossly normal heart sounds.  Good peripheral circulation. Respiratory: Normal respiratory effort.  No retractions. Lungs CTAB. Gastrointestinal: Soft and nontender. No distention. No abdominal bruits. No CVA tenderness. Musculoskeletal: No lower extremity tenderness nor edema.  No joint effusions. Neurologic:  Normal speech and language. No gross focal neurologic deficits are appreciated.  Skin:  Skin is warm, dry and intact. No rash noted. Psychiatric: Pressured speech.  ____________________________________________   LABS (all labs ordered are listed, but only abnormal results are displayed)  Labs Reviewed  COMPREHENSIVE METABOLIC PANEL - Abnormal; Notable for the following:    Sodium 127 (*)    Chloride 95 (*)    CO2 21 (*)    Glucose, Bld 233  (*)    AST 56 (*)    All other components within normal limits  ACETAMINOPHEN LEVEL - Abnormal; Notable for the following:    Acetaminophen (Tylenol), Serum <10 (*)    All other components within normal limits  ETHANOL  SALICYLATE LEVEL  CBC  URINE DRUG SCREEN, QUALITATIVE ([facility] ONLY)  VALPROIC ACID LEVEL  LITHIUM LEVEL   ____________________________________________  EKG   ____________________________________________  RADIOLOGY   ____________________________________________  PROCEDURES    ____________________________________________   INITIAL IMPRESSION / ASSESSMENT AND PLAN / ED COURSE  Pertinent labs & imaging results that were available during my care of the patient were reviewed by me and considered in my medical decision making (see chart for details).  Patient with what appears to be a manic episode. I believe that his mania is likely the cause of his tachycardia. We will involuntarily commit. ____________________________________________   FINAL CLINICAL IMPRESSION(S) / ED DIAGNOSES  Mania    Myrna Blazer, MD 10/06/15 [phone]

## 2015-10-06 NOTE — ED Notes (Signed)
Pt in dayroom, sleeping. Pt will be transferred to BMU next shift. Waiting on medication from pharmacy. No distress noted. Maintained on 15 minute checks and observation by security camera for safety.

## 2015-10-06 NOTE — ED Notes (Addendum)
Report called to [facility] B [facility]

## 2015-10-06 NOTE — ED Notes (Addendum)
Pt continues to sleep in the dayroom. Pt can be aroused, but goes back to sleep quickly. Pt has not eaten any of his dinner tray. No distress noted. Maintained on 15 minute checks and observation by security camera for safety.  Pt will be admitted to BMU when bed/staff available.

## 2015-10-06 NOTE — ED Notes (Signed)
Pt sitting in dayroom, asleep in a recliyner. No distress noted. Maintained on 15 minute checks and observation by security camera for safety.

## 2015-10-06 NOTE — Progress Notes (Signed)
LCSW went in to say goodmorning and patient reported he was fine and taking the fifth ( held up his hand) and decided not to communicate further, wished him a happy day. Delta Air LinesClaudine Hershall Benkert LCSW [phone]

## 2015-10-06 NOTE — ED Notes (Signed)
[facility]

## 2015-10-06 NOTE — ED Notes (Signed)
Pt sleeping in dayroom recliner. No distress noted. Maintained on 15 minute checks and observation by security camera for safety.

## 2015-10-06 NOTE — Progress Notes (Signed)
TTS has spoken with the pts Mother, Loura HaltLyndia Kleeburg @ [phone]. The pts mother reports that the pt has been manic. She reports that although delusions are normal for the pt even at baseline, he has been actively verbalizing delusionals for the past month. Pts mother reports that the pt has not slept in 3 days and 3 nights. Pts mother confirms that she does administer his medication but can not guarantee that he is taken them as he has been known to cheek meds. She reports that the pt complains of side affects ( tics and involuntary hand movements). Pt then refuses to take select medications.   10/06/2015 Cheryl FlashNicole Jennica Tagliaferri, MS, NCC, LPCA Therapeutic Triage Specialist

## 2015-10-06 NOTE — ED Notes (Signed)
Patient assigned to appropriate care area. Patient oriented to unit/care area: Informed that, for their safety, care areas are designed for safety and monitored by security cameras at all times; and visiting hours explained to patient. Patient verbalizes understanding, and verbal contract for safety obtained.  Pt sitting in dayroom, appears very sleepy. Pt denied SI/HI and pain. Pt calm. No concerns voiced. No distress noted. Maintained on 15 minute checks and observation by security camera for safety.

## 2015-10-06 NOTE — Progress Notes (Signed)
[facility] Pt is alert and oriented x4. Pt states "my mom perceived me as going manic" and "my father God told me to prepare a wedding for [place] Spears. I'm supposed to marry her." Denies SI/HI/AVH at this time. Pt thought process is hyperreligious. Speech is pressured and assertive. Pt reports that he has been compliant with his medications because his mom "distributes them to me." Pt says that his family is his biggest stressor, "my family is like an oven, if we get together too much we start to burn." Pt is pleasant throughout the assessment. Skin assessment performed and no contraband found. A scar is noted on pt's abdomen from a previous surgery. Pt's name is written on his left upper arm in ink from a pen. Pt also has a scab on his left lower arm. Pt oriented to unit and provided with a sandwich tray. No concerns or complaints at this time. q15 minute safety checks maintained. Pt remains free from harm.

## 2015-10-06 NOTE — ED Notes (Signed)
Med administered as ordered
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
## 2015-10-06 NOTE — Progress Notes (Signed)
Patient ID: Greg [name], male   DOB: 05/09/1966, 50 y.o.   MRN: [id number] Per State regulations 482.30 this chart was reviewed for medical necessity with respect to the patient's admission/duration of stay.    Next review date: 10/10/15  Thurman CoyerEric Gudelia Eugene, BSN, RN-BC  Case Manager

## 2015-10-06 NOTE — ED Notes (Addendum)
I awakened pt and administered meds as ordered   - Pt observed with no unusual behavior  Appropriate to stimulation  No verbalized needs or concerns at this time  NAD assessed  Continue to monitor

## 2015-10-06 NOTE — Tx Team (Signed)
Initial Interdisciplinary Treatment Plan   PATIENT STRESSORS: Health problems Marital or family conflict   PATIENT STRENGTHS: Active sense of humor Supportive family/friends   PROBLEM LIST: Problem List/Patient Goals Date to be addressed Date deferred Reason deferred Estimated date of resolution  Psychosis 10/06/15           "not worrying about stuff" 10/06/15                                          DISCHARGE CRITERIA:  Improved stabilization in mood, thinking, and/or behavior Motivation to continue treatment in a less acute level of care Need for constant or close observation no longer present Verbal commitment to aftercare and medication compliance  PRELIMINARY DISCHARGE PLAN: Attend aftercare/continuing care group Attend PHP/IOP Outpatient therapy  PATIENT/FAMIILY INVOLVEMENT: This treatment plan has been presented to and reviewed with the patient, Greg [name], and/or family member.  The patient and family have been given the opportunity to ask questions and make suggestions.  Greg [name] 10/06/2015, 11:10 PM

## 2015-10-07 DIAGNOSIS — F312 Bipolar disorder, current episode manic severe with psychotic features: Principal | ICD-10-CM

## 2015-10-07 LAB — LIPID PANEL
Cholesterol: 98 mg/dL (ref 0–200)
HDL: 37 mg/dL — ABNORMAL LOW (ref >40)
LDL Cholesterol: 44 mg/dL (ref 0–99)
Total CHOL/HDL Ratio: 2.6 RATIO
Triglycerides: 87 mg/dL (ref <150)
VLDL: 17 mg/dL (ref 0–40)

## 2015-10-07 LAB — HEMOGLOBIN A1C: Hgb A1c MFr Bld: 12.2 % — ABNORMAL HIGH (ref 4.0–6.0)

## 2015-10-07 LAB — GLUCOSE, CAPILLARY: Glucose-Capillary: 179 mg/dL — ABNORMAL HIGH (ref 65–99)

## 2015-10-07 LAB — TSH: TSH: 1.739 u[IU]/mL (ref 0.350–4.500)

## 2015-10-07 MED ORDER — TEMAZEPAM 15 MG PO CAPS
30.0000 mg | ORAL_CAPSULE | Freq: Every day | ORAL | Status: DC
  Administered 2015-10-07 – 2015-10-11 (×5): 30 mg via ORAL
  Filled 2015-10-07 (×5): qty 2

## 2015-10-07 MED ORDER — PALIPERIDONE ER 3 MG PO TB24
6.0000 mg | ORAL_TABLET | Freq: Every day | ORAL | Status: DC
Start: 2015-10-07 — End: 2015-10-12
  Administered 2015-10-07 – 2015-10-12 (×6): 6 mg via ORAL
  Filled 2015-10-07 (×6): qty 2

## 2015-10-07 NOTE — H&P (Addendum)
Psychiatric Admission Assessment Adult  Patient Identification: Greg [name] MRN:  [id number] Date of Evaluation:  10/07/2015 Chief Complaint:  schizoaffective disorder  Principal Diagnosis: Bipolar affective disorder, currently manic, severe, with psychotic features (HCC) Diagnosis:   Patient Active Problem List   Diagnosis Date Noted  . Hyponatremia [E87.1] 10/06/2015  . Rash and nonspecific skin eruption [R21] 10/06/2015  . Schizoaffective disorder (HCC) [F25.9] 10/06/2015  . Bipolar affective disorder, currently manic, severe, with psychotic features (HCC) [F31.2] 11/28/2014  . HTN (hypertension) [I10] 11/28/2014  . Dyslipidemia [E78.5] 11/28/2014  . Obesity [E66.9] 11/28/2014  . Diabetes (HCC) [E11.9] 11/28/2014  . Tobacco use disorder [F17.200] 11/28/2014   History of Present Illness:  Identifying data. Greg [name] is a 50 year old male with a history of bipolar disorder.   Chief complaint. "I have been gagging mymedications when my mother gives it to me."  History of present illness. Information was obtained from the patient and the chart. [name] has a long history of bipolar illness with multiple hospital admissions for manic episodes. He is last hospitalization here in the fall of 2016. He was discharged on a combination of Lithium, depakote and Invega sustenna injections. He claims to be compliant with injection and reports that it was given 3 weeks ago. Li and VPA are sub-therapeutic but decent. He admtts that he has been trying too rid of pills that his mother tried to administer.  He was brought to the hospital for manic, psychotic, bizarre, hyperreligiuos behaviors for the past 3 days. He was told by god to organize wedding party for [place]. The patient himself denies symptoms of depression, anxiety, or psychosis. He denies alcohol or illicit substance use.  Past psychiatric history. There were multiple hospital admissions and multiple medication trials.  There were no suicide attempts.  Family psychiatric history. Grandmother with schizophrenia.  Social history. He dropped out of high school but got his GED's and went to technical college. He is divorced. He is disabled from mental illness. He lives with his parents. 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  Total Time spent with patient: 1 hour  Past Psychiatric History: bipolar disorder.  Is the patient at risk to self? No.  Has the patient been a risk to self in the past 6 months? No.  Has the patient been a risk to self within the distant past? No.  Is the patient a risk to others? No.  Has the patient been a risk to others in the past 6 months? No.  Has the patient been a risk to others within the distant past? No.   Prior Inpatient Therapy:   Prior Outpatient Therapy:    Alcohol Screening: 1. How often do you have a drink containing alcohol?: Never 2. How many drinks containing alcohol do you have on a typical day when you are drinking?: 1 or 2 3. How often do you have six or more drinks on one occasion?: Never Preliminary Score: 0 4. How often during the last year have you found that you were not able to stop drinking once you had started?: Never 5. How often during the last year have you failed to do what was normally expected from you becasue of drinking?: Never 6. How often during the last year have you needed a first drink in the morning to get yourself going after a heavy drinking session?: Never 7. How often during the last year have you had a feeling of guilt of remorse  after drinking?: Never 8. How often during the last year have you been unable to remember what happened the night before because you had been drinking?: Never 9. Have you or someone else been injured as a result of your drinking?: No 10. Has a relative or friend or a doctor or another health worker been concerned about your drinking or suggested you cut down?:  No Alcohol Use Disorder Identification Test Final Score (AUDIT): 0 Brief Intervention: AUDIT score less than 7 or less-screening does not suggest unhealthy drinking-brief intervention not indicated Substance Abuse History in the last 12 months:  No. Consequences of Substance Abuse: NA Previous Psychotropic Medications: Yes  Psychological Evaluations: No  Past Medical History:  Past Medical History  Diagnosis Date  . Diabetes (HCC)   . Obesity   . Dyslipidemia   . HTN (hypertension)   . Schizo affective schizophrenia (HCC)   . Paranoia (HCC)   . Bipolar 1 disorder [facility])     Past Surgical History  Procedure Laterality Date  . Tonsillectomy    . Appendectomy     Family History: History reviewed. No pertinent family history. Family Psychiatric  History: grandmother with schizophrenia. Tobacco Screening: @FLOW ([phone])::1)@ Social History:  History  Alcohol Use No     History  Drug Use No    Additional Social History:      Pain Medications: see PTA meds Prescriptions: see PTA meds Over the Counter: see PTA meds History of alcohol / drug use?: No history of alcohol / drug abuse Longest period of sobriety (when/how long):  (n/a) Negative Consequences of Use:  (n/a) Withdrawal Symptoms:  (n/a)                    Allergies:   Allergies  Allergen Reactions  . Haldol [Haloperidol] Anaphylaxis  . Risperdal [Risperidone] Other (See Comments)    Reaction: unknown   . Seroquel [Quetiapine Fumarate] Other (See Comments)    Reaction: unknown    Lab Results:  Results for orders placed or performed during the hospital encounter of 10/06/15 (from the past 48 hour(s))  Glucose, capillary     Status: Abnormal   Collection Time: 10/07/15  6:45 AM  Result Value Ref Range   Glucose-Capillary 179 (H) 65 - 99 mg/dL  Lipid panel, fasting     Status: Abnormal   Collection Time: 10/07/15  7:32 AM  Result Value Ref Range   Cholesterol 98 0 - 200 mg/dL   Triglycerides 87  <213 mg/dL   HDL 37 (L) >08 mg/dL   Total CHOL/HDL Ratio 2.6 RATIO   VLDL 17 0 - 40 mg/dL   LDL Cholesterol 44 0 - 99 mg/dL    Comment:        Total Cholesterol/HDL:CHD Risk Coronary Heart Disease Risk Table                     Men   Women  1/2 Average Risk   3.4   3.3  Average Risk       5.0   4.4  2 X Average Risk   9.6   7.1  3 X Average Risk  23.4   11.0        Use the calculated Patient Ratio above and the CHD Risk Table to determine the patient's CHD Risk.        ATP III CLASSIFICATION (LDL):  <100     mg/dL   Optimal  657-846  mg/dL   Near or Above  Optimal  130-159  mg/dL   Borderline  161-096  mg/dL   High  >045     mg/dL   Very High   TSH     Status: None   Collection Time: 10/07/15  7:32 AM  Result Value Ref Range   TSH 1.739 0.350 - 4.500 uIU/mL    Blood Alcohol level:  Lab Results  Component Value Date   ETH <5 10/06/2015   ETH <5 08/27/2015    Metabolic Disorder Labs:  Lab Results  Component Value Date   HGBA1C 7.3* 04/16/2015   MPG 154 02/09/2015   MPG 229 08/16/2007   No results found for: PROLACTIN Lab Results  Component Value Date   CHOL 98 10/07/2015   TRIG 87 10/07/2015   HDL 37* 10/07/2015   CHOLHDL 2.6 10/07/2015   VLDL 17 10/07/2015   LDLCALC 44 10/07/2015   LDLCALC 34 04/16/2015    Current Medications: Current Facility-Administered Medications  Medication Dose Route Frequency Provider Last Rate Last Dose  . acetaminophen (TYLENOL) tablet 650 mg  650 mg Oral Q6H PRN Audery Amel, MD      . alum & mag hydroxide-simeth (MAALOX/MYLANTA) 200-200-20 MG/5ML suspension 30 mL  30 mL Oral Q4H PRN Audery Amel, MD      . amantadine (SYMMETREL) capsule 100 mg  100 mg Oral BID Audery Amel, MD   100 mg at 10/07/15 0827  . clonazePAM (KLONOPIN) tablet 1 mg  1 mg Oral TID PRN Audery Amel, MD      . divalproex (DEPAKOTE) DR tablet 500 mg  500 mg Oral Q12H Audery Amel, MD   500 mg at 10/07/15 0827  . lithium  carbonate (ESKALITH) CR tablet 450 mg  450 mg Oral Q12H Audery Amel, MD   450 mg at 10/07/15 0828  . magnesium hydroxide (MILK OF MAGNESIA) suspension 30 mL  30 mL Oral Daily PRN Audery Amel, MD   30 mL at 10/07/15 0831  . metFORMIN (GLUCOPHAGE) tablet 1,000 mg  1,000 mg Oral BID WC Audery Amel, MD   1,000 mg at 10/07/15 0827  . metoprolol succinate (TOPROL-XL) 24 hr tablet 50 mg  50 mg Oral Daily Audery Amel, MD   50 mg at 10/07/15 0827  . nicotine (NICODERM CQ - dosed in mg/24 hours) patch 21 mg  21 mg Transdermal Daily Shari Prows, MD   21 mg at 10/07/15 0828  . propranolol (INDERAL) tablet 20 mg  20 mg Oral BID Audery Amel, MD   20 mg at 10/07/15 0827  . simvastatin (ZOCOR) tablet 10 mg  10 mg Oral q1800 Audery Amel, MD      . traZODone (DESYREL) tablet 100 mg  100 mg Oral QHS Audery Amel, MD   100 mg at 10/06/15 2228  . trihexyphenidyl (ARTANE) tablet 2 mg  2 mg Oral QHS Audery Amel, MD   2 mg at 10/06/15 2228   PTA Medications: Prescriptions prior to admission  Medication Sig Dispense Refill Last Dose  . amantadine (SYMMETREL) 100 MG capsule Take 1 capsule (100 mg total) by mouth 2 (two) times daily. (Patient taking differently: Take 100 mg by mouth 3 (three) times daily. ) 60 capsule 0 unknown  . clonazePAM (KLONOPIN) 1 MG tablet Take 1 mg by mouth every 8 (eight) hours as needed for anxiety.    PRN  . divalproex (DEPAKOTE) 500 MG DR tablet Take 1 tablet (500 mg total) by mouth  every 12 (twelve) hours. (Patient taking differently: Take 500 mg by mouth 2 (two) times daily. ) 60 tablet 0 unknown  . lithium carbonate (ESKALITH) 450 MG CR tablet Take 1 tablet (450 mg total) by mouth every 12 (twelve) hours. (Patient taking differently: Take 450 mg by mouth 2 (two) times daily. ) 60 tablet 0 unknown  . metFORMIN (GLUCOPHAGE) 1000 MG tablet Take 1 tablet (1,000 mg total) by mouth 2 (two) times daily with a meal. (Patient not taking: Reported on 10/06/2015) 60 tablet 0  unknown at unknown  . metoprolol succinate (TOPROL-XL) 50 MG 24 hr tablet Take 1 tablet (50 mg total) by mouth daily. Take with or immediately following a meal. (Patient not taking: Reported on 10/06/2015) 30 tablet 0 unknown at unknown  . paliperidone (INVEGA SUSTENNA) 234 MG/1.5ML SUSP injection Inject 234 mg into the muscle every 28 (twenty-eight) days. 0.9 mL 1 unknown  . propranolol (INDERAL) 20 MG tablet Take 20 mg by mouth 2 (two) times daily.    unknown  . simvastatin (ZOCOR) 10 MG tablet Take 1 tablet (10 mg total) by mouth at bedtime. (Patient not taking: Reported on 10/06/2015) 30 tablet 0 unknown at unknown  . traZODone (DESYREL) 100 MG tablet Take 1 tablet (100 mg total) by mouth at bedtime. 30 tablet 0 unknown  . trihexyphenidyl (ARTANE) 2 MG tablet Take 2 mg by mouth at bedtime.   unknown    Musculoskeletal: Strength & Muscle Tone: within normal limits Gait & Station: normal Patient leans: N/A  Psychiatric Specialty Exam: I reviewed physical exam performed in the emergency room and agree with her findings. Physical Exam  Nursing note and vitals reviewed.   Review of Systems  Neurological: Positive for tremors.  All other systems reviewed and are negative.   Blood pressure 101/52, pulse 87, temperature 99 F (37.2 C), temperature source Oral, resp. rate 20, height  (1.778 m), weight 117.028 kg (258 lb), SpO2 95 %.Body mass index is 37.02 kg/(m^2).  See SRA.                                                  Sleep:  Number of Hours: 5.75     Treatment Plan Summary: Daily contact with patient to assess and evaluate symptoms and progress in treatment and Medication management   Mr. Southwood is a 50 year old male with a history of bipolar disorder admitted for manic episode.  1. Mood and psychosis. He has been maintained on [place] injections but it is not clear if he was compliant. We will start oral Invega. We will continue lithium and  Depakote.   2. Tremor. It is unclear if this is Parkinson's, familial or lithium/depakote tremor. He is on Amantadine and Artane.  3. Diabetes. We continued metformin, ADA diet and blood glucose monitoring.  4. Smoking. Nicotine patch is available.  5. Hypertension. He is on metoprolol.   6. Metabolic syndrome monitoring. Lipid profile and TSH are normal. Hemoglobin A1c is elevated, prolactin still pending.  7. Insomnia. He sleptonly 4 hours with Trazodone. We offer Restoril.  8. Hyponatremia. Likely from excessive drinking and or depakote. We encourage limited fluid intake.   9. Discharge planning. He will be discharged to home with his parents. He will follow up with his psychiatrist at [facility].   Observation Level/Precautions:  15 minute checks  Laboratory:  CBC Chemistry Profile UDS UA  Psychotherapy:    Medications:    Consultations:    Discharge Concerns:    Estimated LOS:  Other:     I certify that inpatient services furnished can reasonably be expected to improve the patient's condition.    Kristine LineaJolanta Pucilowska, MD 3/10/201712:12 PM

## 2015-10-07 NOTE — Progress Notes (Signed)
Recreation Therapy Notes  Date: 03.10.17 Time: 3:00 pm Location: Craft Room  Group Topic: Communication, Problem solving, Teamwork  Goal Area(s) Addresses:  Patient will work in teams towards shared goal. Patient will verbalize skills needed to make activity successful. Patient will verbalize benefit of using skills identified to reach post d/c goals.  Behavioral Response: Disruptive, Intermittently Attentive, Interactive  Intervention: Landing Pad  Activity: Patients were put in groups and instructed to build a contraption to catch the golf ball with 15 straws and approximately 2.5 feet of tape.  Education: LRT educated patients on healthy support systems.  Education Outcome: In group clarification offered  Clinical Observations/Feedback: Initially, patient was disruptive by having side conversations. LRT redirected and patient complied. Patient worked with team to build contraption. Patient contributed to group discussion.  Jacquelynn CreeGreene,Savhanna Sliva M, LRT/CTRS 10/07/2015 4:06 PM

## 2015-10-07 NOTE — Progress Notes (Signed)
Recreation Therapy Notes  INPATIENT RECREATION THERAPY ASSESSMENT  Patient Details Name: Greg PonderRichard Barnett MRN: [id number] DOB: 05/17/1966 Today's Date: 10/07/2015  Patient Stressors:  Patient did not report any stressors.  Coping Skills:   Isolate, Exercise, Art/Dance, Talking, Music, Sports, Other (Comment) (Relaxation techniques)  Personal Challenges:  Patient reported no personal challenges.  Leisure Interests (2+):  Music - Play instrument, Individual - Other (Comment) (Walk neighbor's dog)  Awareness of Community Resources:  Yes  Community Resources:  Park  Current Use: Yes  If no, Barriers?:    Patient Strengths:  "I love the Lord and I stay in good health. I'm kind to people"  Patient Identified Areas of Improvement:  No  Current Recreation Participation:  "I'm having fun all the time."  Patient Goal for Hospitalization:  Hand out and socialize and get out of the box. Feels like he has been in a bubble too long and the [name] brought him here  [place]  County [place] Residence:  [place]   Current SI (including self-harm):  No  Current HI:  No  Consent to Intern Participation: N/A  Due to patient reporting no personal challenges, LRT will not develop a Recreational Therapy Care Plan. If patient's status changes, LRT will develop a Recreational Therapy Care Plan.  Jacquelynn CreeGreene,Azaiah Licciardi M, LRT/CTRS 10/07/2015, 1:49 PM

## 2015-10-07 NOTE — [facility] Group Notes (Signed)
[facility] LCSW Aftercare Discharge Planning Group Note   10/07/2015 12:49 PM  Participation Quality: Did not attend.   Keylee Shrestha L Rosary Filosa MSW, LCSWA  

## 2015-10-07 NOTE — Progress Notes (Signed)
D:  Pt refused to fill out self inventory sheet, no complaints today other than tremor, denies SI/HI/AVH, bizarre behaviors at times, need redirection, laughs inappropriately, fixed smile at times.    A:  Emotional support provided, Encouraged pt to continue with treatment plan and attend all group activities, q15 min checks maintained for safety.  R:  Pt is receptive, going to groups, pleasant and cooperative with staff and other patients on the unit.

## 2015-10-07 NOTE — [facility] Group Notes (Signed)
[facility] Group Notes:  (Nursing/MHT/Case Management/Adjunct)  Date:  10/07/2015  Time:  12:19 PM  Type of Therapy:  Psychoeducational Skills  Participation Level:  Active  Participation Quality:  Appropriate  Affect:  Appropriate  Cognitive:  Appropriate  Insight:  Appropriate  Engagement in Group:  Engaged  Modes of Intervention:  Activity  Summary of Progress/Problems:  Mickey Farberamela M Moses Ellison 10/07/2015, 12:19 PM

## 2015-10-07 NOTE — [facility] Group Notes (Signed)
[facility] LCSW Group Therapy   10/07/2015 1pm  Type of Therapy: Group Therapy   Participation Level: Did Not Attend. Patient invited to participate but declined.    Semaj Coburn F. Zeriah Baysinger, MSW, LCSWA, LCAS   

## 2015-10-07 NOTE — [facility] Suicide Risk Assessment (Signed)
[facility]   Nursing information obtained from:  Patient, Review of record Demographic factors:  Male, Caucasian, Low socioeconomic status Current Mental Status:  NA Loss Factors:  NA Historical Factors:  NA Risk Reduction Factors:  Living with another person, especially a relative, Employed, Positive social support  Total Time spent with patient: 1 hour Principal Problem: Bipolar affective disorder, currently manic, severe, with psychotic features (HCC) Diagnosis:   Patient Active Problem List   Diagnosis Date Noted  . Hyponatremia [E87.1] 10/06/2015  . Rash and nonspecific skin eruption [R21] 10/06/2015  . Schizoaffective disorder (HCC) [F25.9] 10/06/2015  . Bipolar affective disorder, currently manic, severe, with psychotic features (HCC) [F31.2] 11/28/2014  . HTN (hypertension) [I10] 11/28/2014  . Dyslipidemia [E78.5] 11/28/2014  . Obesity [E66.9] 11/28/2014  . Diabetes (HCC) [E11.9] 11/28/2014  . Tobacco use disorder [F17.200] 11/28/2014   Subjective Data: Insomnia, grandiosity, racing thoughts.  Continued Clinical Symptoms:  Alcohol Use Disorder Identification Test Final Score (AUDIT): 0 The "Alcohol Use Disorders Identification Test", Guidelines for Use in Primary Care, Second Edition.  World Science writerHealth Organization [facility](WHO). Score between 0-7:  no or low risk or alcohol related problems. Score between 8-15:  moderate risk of alcohol related problems. Score between 16-19:  high risk of alcohol related problems. Score 20 or above:  warrants further diagnostic evaluation for alcohol dependence and treatment.   CLINICAL FACTORS:   Bipolar Disorder:   Mixed State   Musculoskeletal: Strength & Muscle Tone: within normal limits Gait & Station: normal Patient leans: N/A  Psychiatric Specialty Exam: Review of Systems  Neurological: Positive for tremors.  All other systems reviewed and are negative.   Blood pressure 101/52, pulse 87, temperature 99  F (37.2 C), temperature source Oral, resp. rate 20, height 5\' 10"  (1.778 m), weight 117.028 kg (258 lb), SpO2 95 %.Body mass index is 37.02 kg/(m^2).  General Appearance: Fairly Groomed  Patent attorneyye Contact::  Good  Speech:  Pressured  Volume:  Normal  Mood:  Euphoric  Affect:  Congruent  Thought Process:  Goal Directed  Orientation:  Full (Time, Place, and Person)  Thought Content:  WDL  Suicidal Thoughts:  No  Homicidal Thoughts:  No  Memory:  Immediate;   Fair Recent;   Fair Remote;   Fair  Judgement:  Impaired  Insight:  Present  Psychomotor Activity:  Normal  Concentration:  Fair  Recall:  FiservFair  Fund of Knowledge:Fair  Language: Fair  Akathisia:  No  Handed:  Right  AIMS (if indicated):     Assets:  Communication Skills Desire for Improvement Financial Resources/Insurance Housing Physical Health Resilience Social Support Transportation  Sleep:  Number of Hours: 5.75  Cognition: WNL  ADL's:  Intact    COGNITIVE FEATURES THAT CONTRIBUTE TO RISK:  None    SUICIDE RISK:   Minimal: No identifiable suicidal ideation.  Patients presenting with no risk factors but with morbid ruminations; may be classified as minimal risk based on the severity of the depressive symptoms  PLAN OF CARE: Hospital admission, medication management, discharge planning.  Greg [name] is a 50 year old male with a history of bipolar disorder admitted for manic episode.  1. Mood and psychosis. He has been maintained on [place] Sustenna injections. We will check with a provider on if he is due for another shot. He reports being compliant. We will continue lithium and Depakote. He as slightly subtherapeutic but the patient admits that he was gagging medications that his mother was giving him.  2. Familial tremor.  Will continue propranolol. He is also on Amantadine and Artane.  3. Diabetes. We continued metformin, ADA diet and blood glucose monitoring.  4. Smoking. Nicotine patch is available.  5.  Hypertension. He is on metoprolol. This is unnecessary to have this patient on propranolol and metoprolol will try to clarify which one he is really taking. His blood pressure was low today and we had to hold his blood pressure medication.  6. Metabolic syndrome monitoring. Lipid profile and TSH are normal. Hemoglobin A1c and prolactin are pending.  7. Discharge planning. He will be discharged to home with his parents. He will follow up with his psychiatrist at [facility].  I certify that inpatient services furnished can reasonably be expected to improve the patient's condition.   Kristine Linea, MD 10/07/2015, 12:03 PM

## 2015-10-08 LAB — PROLACTIN: Prolactin: 45.2 ng/mL — ABNORMAL HIGH (ref 4.0–15.2)

## 2015-10-08 LAB — GLUCOSE, CAPILLARY
Glucose-Capillary: 165 mg/dL — ABNORMAL HIGH (ref 65–99)
Glucose-Capillary: 191 mg/dL — ABNORMAL HIGH (ref 65–99)

## 2015-10-08 NOTE — Progress Notes (Signed)
Pt has been pleasant and cooperative. Pt denies SI and A/V hallucinations. Pt has been active on the unit and attending all unit  activities. Will continue to observe and maintain a safe environment

## 2015-10-08 NOTE — Progress Notes (Signed)
Patient ID: Greg [name], male   DOB: 09/15/1965, 50 y.o.   MRN: [id number]  Pt's mother Loura HaltLyndia Kleeburg reports pt will start complaining about side effects such as his finger twitching. He will start refusing to take his medications and it does not take long for him to deteriorate. Mrs. Wynetta FinesKleeburg reports pt removed things from the house and filled the front yard "decorated for the wedding to [place]." Pt sat outside for 2 days with his shirt off in the front yard. He rubbed [place] all over his body. He has had an ACT team in the past but it was not successful. He has been placed into facilities in the past but he would become paranoid and run away.   Daisy FloroCandace L Barri Neidlinger MSW, LCSWA 10/08/2015 3:12 PM

## 2015-10-08 NOTE — Progress Notes (Signed)
[facility] MD Progress Note  10/08/2015 4:49 PM Greg [name]  MRN:  [id number] Subjective:  Patient today says he is feeling much better. He denies any hallucinations. He denies any bizarre thinking. He has been compliant with medicine. He is still not very forthcoming in his speech. Insight is modest at best. No physical complaints today. I note that his blood sugars still runs a little high his blood pressure is adequate. Principal Problem: Bipolar affective disorder, currently manic, severe, with psychotic features (HCC) Diagnosis:   Patient Active Problem List   Diagnosis Date Noted  . Affective psychosis, bipolar (HCC) [F31.9]   . Hyponatremia [E87.1] 10/06/2015  . Rash and nonspecific skin eruption [R21] 10/06/2015  . Schizoaffective disorder (HCC) [F25.9] 10/06/2015  . Bipolar affective disorder, currently manic, severe, with psychotic features (HCC) [F31.2] 11/28/2014  . HTN (hypertension) [I10] 11/28/2014  . Dyslipidemia [E78.5] 11/28/2014  . Obesity [E66.9] 11/28/2014  . Diabetes (HCC) [E11.9] 11/28/2014  . Tobacco use disorder [F17.200] 11/28/2014   Total Time spent with patient: 25 minutes  Past Psychiatric History: Patient has a history of schizoaffective disorder and or bipolar disorder and has had admissions with psychotic features and mania in the past. No history of suicide attempts. History of bizarre behavior and medicine noncompliance.  Past Medical History:  Past Medical History  Diagnosis Date  . Diabetes (HCC)   . Obesity   . Dyslipidemia   . HTN (hypertension)   . Schizo affective schizophrenia (HCC)   . Paranoia (HCC)   . Bipolar 1 disorder [facility])     Past Surgical History  Procedure Laterality Date  . Tonsillectomy    . Appendectomy     Family History: History reviewed. No pertinent family history. Family Psychiatric  History: No family history known of any mental health problems Social History:  History  Alcohol Use No     History  Drug Use No     Social History   Social History  . Marital Status: Single    Spouse Name: N/A  . Number of Children: N/A  . Years of Education: N/A   Social History Main Topics  . Smoking status: Current Every Day Smoker -- 1.00 packs/day    Types: Cigarettes  . Smokeless tobacco: Current User    Types: Snuff  . Alcohol Use: No  . Drug Use: No  . Sexual Activity: No   Other Topics Concern  . None   Social History Narrative   Additional Social History:    Pain Medications: see PTA meds Prescriptions: see PTA meds Over the Counter: see PTA meds History of alcohol / drug use?: No history of alcohol / drug abuse Longest period of sobriety (when/how long):  (n/a) Negative Consequences of Use:  (n/a) Withdrawal Symptoms:  (n/a)                    Sleep: Fair  Appetite:  Good  Current Medications: Current Facility-Administered Medications  Medication Dose Route Frequency Provider Last Rate Last Dose  . acetaminophen (TYLENOL) tablet 650 mg  650 mg Oral Q6H PRN Audery Amel, MD      . alum & mag hydroxide-simeth (MAALOX/MYLANTA) 200-200-20 MG/5ML suspension 30 mL  30 mL Oral Q4H PRN Audery Amel, MD   30 mL at 10/08/15 0054  . amantadine (SYMMETREL) capsule 100 mg  100 mg Oral BID Audery Amel, MD   100 mg at 10/08/15 0911  . clonazePAM (KLONOPIN) tablet 1 mg  1 mg Oral TID PRN  Audery Amel, MD      . divalproex (DEPAKOTE) DR tablet 500 mg  500 mg Oral Q12H Audery Amel, MD   500 mg at 10/08/15 0911  . lithium carbonate (ESKALITH) CR tablet 450 mg  450 mg Oral Q12H Audery Amel, MD   450 mg at 10/08/15 0910  . magnesium hydroxide (MILK OF MAGNESIA) suspension 30 mL  30 mL Oral Daily PRN Audery Amel, MD   30 mL at 10/07/15 0831  . metFORMIN (GLUCOPHAGE) tablet 1,000 mg  1,000 mg Oral BID WC Audery Amel, MD   1,000 mg at 10/08/15 0911  . metoprolol succinate (TOPROL-XL) 24 hr tablet 50 mg  50 mg Oral Daily Audery Amel, MD   50 mg at 10/08/15 0911  . nicotine  (NICODERM CQ - dosed in mg/24 hours) patch 21 mg  21 mg Transdermal Daily [name], MD   21 mg at 10/08/15 0915  . paliperidone (INVEGA) 24 hr tablet 6 mg  6 mg Oral Daily [name], MD   6 mg at 10/08/15 0912  . simvastatin (ZOCOR) tablet 10 mg  10 mg Oral q1800 Audery Amel, MD   10 mg at 10/07/15 1700  . temazepam (RESTORIL) capsule 30 mg  30 mg Oral QHS Shari Prows, MD   30 mg at 10/07/15 2157  . trihexyphenidyl (ARTANE) tablet 2 mg  2 mg Oral QHS Audery Amel, MD   2 mg at 10/07/15 2157    Lab Results:  Results for orders placed or performed during the hospital encounter of 10/06/15 (from the past 48 hour(s))  Glucose, capillary     Status: Abnormal   Collection Time: 10/07/15  6:45 AM  Result Value Ref Range   Glucose-Capillary 179 (H) 65 - 99 mg/dL  Hemoglobin Z6X     Status: Abnormal   Collection Time: 10/07/15  7:32 AM  Result Value Ref Range   Hgb A1c MFr Bld 12.2 (H) 4.0 - 6.0 %  Lipid panel, fasting     Status: Abnormal   Collection Time: 10/07/15  7:32 AM  Result Value Ref Range   Cholesterol 98 0 - 200 mg/dL   Triglycerides 87 <096 mg/dL   HDL 37 (L) >04 mg/dL   Total CHOL/HDL Ratio 2.6 RATIO   VLDL 17 0 - 40 mg/dL   LDL Cholesterol 44 0 - 99 mg/dL    Comment:        Total Cholesterol/HDL:CHD Risk Coronary Heart Disease Risk Table                     Men   Women  1/2 Average Risk   3.4   3.3  Average Risk       5.0   4.4  2 X Average Risk   9.6   7.1  3 X Average Risk  23.4   11.0        Use the calculated Patient Ratio above and the CHD Risk Table to determine the patient's CHD Risk.        ATP III CLASSIFICATION (LDL):  <100     mg/dL   Optimal  540-981  mg/dL   Near or Above                    Optimal  130-159  mg/dL   Borderline  191-478  mg/dL   High  >295     mg/dL   Very High  Prolactin     Status: Abnormal   Collection Time: 10/07/15  7:32 AM  Result Value Ref Range   Prolactin 45.2 (H) 4.0 - 15.2 ng/mL     Comment: (NOTE) Performed At: [facility]  [address] [place], [place] [id number] Mila HomerHancock William F MD WU:[id number]:[phone]   TSH     Status: None   Collection Time: 10/07/15  7:32 AM  Result Value Ref Range   TSH 1.739 0.350 - 4.500 uIU/mL  Glucose, capillary     Status: Abnormal   Collection Time: 10/08/15  6:38 AM  Result Value Ref Range   Glucose-Capillary 165 (H) 65 - 99 mg/dL  Glucose, capillary     Status: Abnormal   Collection Time: 10/08/15 11:57 AM  Result Value Ref Range   Glucose-Capillary 191 (H) 65 - 99 mg/dL    Blood Alcohol level:  Lab Results  Component Value Date   ETH <5 10/06/2015   ETH <5 08/27/2015    Physical Findings: AIMS: Facial and Oral Movements Muscles of Facial Expression: None, normal Lips and Perioral Area: None, normal Jaw: None, normal Tongue: None, normal,Extremity Movements Upper (arms, wrists, hands, fingers): None, normal Lower (legs, knees, ankles, toes): None, normal, Trunk Movements Neck, shoulders, hips: None, normal, Overall Severity Severity of abnormal movements (highest score from questions above): None, normal Incapacitation due to abnormal movements: None, normal Patient's awareness of abnormal movements (rate only patient's report): No Awareness, Dental Status Current problems with teeth and/or dentures?: No Does patient usually wear dentures?: No  CIWA:    COWS:     Musculoskeletal: Strength & Muscle Tone: within normal limits Gait & Station: normal Patient leans: N/A  Psychiatric Specialty Exam: Review of Systems  Constitutional: Negative.   HENT: Negative.   Eyes: Negative.   Respiratory: Negative.   Cardiovascular: Negative.   Gastrointestinal: Negative.   Musculoskeletal: Negative.   Skin: Negative.   Neurological: Negative.   Psychiatric/Behavioral: Negative for depression, suicidal ideas, hallucinations, memory loss and substance abuse. The patient is not nervous/anxious and does not have insomnia.      Blood pressure 142/83, pulse 86, temperature 99 F (37.2 C), temperature source Oral, resp. rate 20, height 5\' 10"  (1.778 m), weight 117.028 kg (258 lb), SpO2 95 %.Body mass index is 37.02 kg/(m^2).  General Appearance: Disheveled  Eye SolicitorContact::  Fair  Speech:  Normal Rate  Volume:  Decreased  Mood:  Euthymic  Affect:  Inappropriate  Thought Process:  Circumstantial  Orientation:  Full (Time, Place, and Person)  Thought Content:  Negative  Suicidal Thoughts:  No  Homicidal Thoughts:  No  Memory:  Immediate;   Good Recent;   Fair Remote;   Fair  Judgement:  Fair  Insight:  Fair  Psychomotor Activity:  Normal  Concentration:  Fair  Recall:  FiservFair  Fund of Knowledge:Fair  Language: Fair  Akathisia:  Negative  Handed:  Ambidextrous  AIMS (if indicated):     Assets:  Communication Skills Desire for Improvement Financial Resources/Insurance Housing Resilience Social Support  ADL's:  Intact  Cognition: WNL  Sleep:  Number of Hours: 3   Treatment Plan Summary: Daily contact with patient to assess and evaluate symptoms and progress in treatment, Medication management and Plan As far as his schizoaffective disorder he is currently compliant with his combine medicine. He has calm down and is not showing current bizarre behavior although he mostly stays confined to his room. Nursing has not reported any problems with him. No complaints of any side effects.  His sleep is still not very good at night. His diabetes could be better controlled. Diet is still not good. He still has some high sugars much of the time but his blood pressure is under good control. Discharge planning will need to include active planning to make sure he can stay on appropriate medicines and has good follow-up after discharge. He might be a good candidate for an act team.  Mordecai Rasmussen, MD 10/08/2015, 4:49 PM

## 2015-10-08 NOTE — [facility] Group Notes (Signed)
[facility] LCSW Group Therapy  10/08/2015 4:14 PM  Type of Therapy:  Group Therapy  Participation Level:  Did Not Attend  Modes of Intervention:  Discussion, Education, Socialization and Support  Summary of Progress/Problems: Balance in life: Patients will discuss the concept of balance and how it looks and feels to be unbalanced. Pt will identify areas in their life that is unbalanced and ways to become more balanced.    Yukiko Minnich L Rhyder Bratz MSW, LCSWA  10/08/2015, 4:14 PM  

## 2015-10-08 NOTE — Plan of Care (Signed)
Problem: Ineffective individual coping Goal: STG: Patient will remain free from self harm Outcome: Progressing Patient has remained free from harm since admission.      

## 2015-10-08 NOTE — [facility] Suicide Risk Assessment (Signed)
[facility] INPATIENT:  Family/Significant Other Suicide Prevention Education  Suicide Prevention Education:  Education Completed; Loura HaltLyndia Kleeburg [phone] (mother) has been identified by the patient as the family member/significant other with whom the patient will be residing, and identified as the person(s) who will aid the patient in the event of a mental health crisis (suicidal ideations/suicide attempt).  With written consent from the patient, the family member/significant other has been provided the following suicide prevention education, prior to the and/or following the discharge of the patient.  The suicide prevention education provided includes the following:  Suicide risk factors  Suicide prevention and interventions  National Suicide Hotline telephone number  [facility] assessment telephone number  [facility] Emergency Assistance 911  [facility] and/or Residential Mobile Crisis Unit telephone number  Request made of family/significant other to:  Remove weapons (e.g., guns, rifles, knives), all items previously/currently identified as safety concern.    Remove drugs/medications (over-the-counter, prescriptions, illicit drugs), all items previously/currently identified as a safety concern.  The family member/significant other verbalizes understanding of the suicide prevention education information provided.  The family member/significant other agrees to remove the items of safety concern listed above.  Sempra EnergyCandace L Toy Samarin MSW, LCSWA  10/08/2015, 3:13 PM

## 2015-10-08 NOTE — [facility] Counselor (Signed)
Adult Comprehensive Assessment  Patient ID: Greg [name], male   DOB: November 01, 1965, 50 y.o.   MRN: [id number]  Information Source: Information source: Patient  Current Stressors:  Educational / Learning stressors: None reported  Employment / Job issues: SSDI  Family Relationships: None reported  Surveyor, quantity / Lack of resources (include bankruptcy): Limited income.  Housing / Lack of housing: Pt lives with parents.  Physical health (include injuries & life threatening diseases): None reported.  Social relationships: None reported  Substance abuse: Denies use.  Bereavement / Loss: None reported   Living/Environment/Situation:  Living Arrangements: Parent Living conditions (as described by patient or guardian): "wonderful"  How long has patient lived in current situation?: 23 years  What is atmosphere in current home: Comfortable, Paramedic, Supportive  Family History:  Marital status: Single Are you sexually active?: No What is your sexual orientation?: Heterosexual  Has your sexual activity been affected by drugs, alcohol, medication, or emotional stress?: None reported  Does patient have children?: No  Childhood History:  By whom was/is the patient raised?: Both parents Description of patient's relationship with caregiver when they were a child: "wonderful"  Patient's description of current relationship with people who raised him/her: "wonderful"  How were you disciplined when you got in trouble as a child/adolescent?: None reported  Does patient have siblings?: Yes Number of Siblings: 1 Description of patient's current relationship with siblings: Sister who passed away in 50.  Did patient suffer any verbal/emotional/physical/sexual abuse as a child?: No Did patient suffer from severe childhood neglect?: No Has patient ever been sexually abused/assaulted/raped as an adolescent or adult?: No Was the patient ever a victim of a crime or a disaster?: No Witnessed domestic  violence?: No Has patient been effected by domestic violence as an adult?: No  Education:  Highest grade of school patient has completed: Some Automotive engineer  Currently a student?: No Learning disability?: No  Employment/Work Situation:   Employment situation: On disability Why is patient on disability: Mental illness How long has patient been on disability: over 20 years  Patient's job has been impacted by current illness: No What is the longest time patient has a held a job?: 1.5 years 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 Has patient ever been in the [employer]?: No  Financial Resources:   Surveyor, quantity resources: Insurance claims handler, Support from parents / caregiver, Medicaid, Medicare Does patient have a representative payee or guardian?: No  Alcohol/Substance Abuse:   What has been your use of drugs/alcohol within the last 12 months?: Denies use  Alcohol/Substance Abuse Treatment Hx: Denies past history Has alcohol/substance abuse ever caused legal problems?: No  Social Support System:   Conservation officer, nature Support System: Good Describe Community Support System: family  Type of faith/religion: Pentecostal How does patient's faith help to cope with current illness?: "God talks to me and I speak in tounges"   Leisure/Recreation:   Leisure and Hobbies: Pension scheme manager, playing guitar, writing poetry   Strengths/Needs:   What things does the patient do well?: writing music In what areas does patient struggle / problems for patient: "nothing really"   Discharge Plan:   Does patient have access to transportation?: Yes Will patient be returning to same living situation after discharge?: Yes Currently receiving community mental health services: Yes (From Whom) Vesta Mixer ) Does patient have financial barriers related to discharge medications?: No  Summary/Recommendations:    Patient is a 50 year old male admitted  with a diagnosis of Schizoaffective Disorder. Patient presented to  the  hospital with mania. Pt reports God told him that he will marry ChinaBrittany Spears and to get his house ready. He also put a teddy bear in his front yard to protest [facility]. Pt reports complying with medications. [facility] SSDI [facility]. Pt plans to return home and follow up with outpatient.  Patient could not identify any triggers for admission. Patient will benefit from crisis stabilization, medication evaluation, group therapy and psycho education in addition to case management for discharge. At discharge, it is recommended that patient remain compliant with established discharge plan and continued treatment.   Greg [name].MSW, [facility]  10/08/2015

## 2015-10-08 NOTE — Progress Notes (Signed)
D: Patient appears very animated. States he's here because he was walking to stop abortion and that he won't stop. He states he's also here because he wanted to marry Safeway IncBritney Spears. He denies SI/HI/AVH. He also denies pain. A: Medication was given with education. Encouragement provided.  R: Patient was compliant with medication. He has remained calm and cooperative. Safety maintained with 15 min checks.

## 2015-10-09 LAB — GLUCOSE, CAPILLARY: Glucose-Capillary: 144 mg/dL — ABNORMAL HIGH (ref 65–99)

## 2015-10-09 NOTE — Progress Notes (Signed)
Pt has been pleasant and cooperative. Pt denies SI and A/V hallucinations. Pt has been active on the unit and attending all unit activities. Pt appears to have trouble staying awake. Will continue to observe and maintain a safe environment

## 2015-10-09 NOTE — Progress Notes (Signed)
D: Patient appears very animated. Patient sill disorganized and delusional. States when he leaves he's going to live in one of his four houses. He denies SI/HI/AVH. He also denies pain. A: Medication was given with education. Encouragement provided.  R: Patient was compliant with medication. He has remained calm and cooperative. Safety maintained with 15 min checks.

## 2015-10-09 NOTE — [facility] Group Notes (Signed)
[facility] Group Notes:  (Nursing/MHT/Case Management/Adjunct)  Date:  10/09/2015  Time:  9:13 PM  Type of Therapy:  Group Therapy  Participation Level:  Active  Participation Quality:  Appropriate  Affect:  Appropriate  Cognitive:  Appropriate  Insight:  Appropriate  Engagement in Group:  Engaged  Modes of Intervention:  Discussion  Summary of Progress/Problems:  Greg [name] [name] [name] 10/09/2015, 9:13 PM

## 2015-10-09 NOTE — Progress Notes (Signed)
[facility] MD Progress Note  10/09/2015 3:00 PM Greg [name]  MRN:  [id number] Subjective: Patient continues to say he is feeling better. Denies any psychotic symptoms. He is not acting bizarre or labile. Tolerating medicine well. He is a little bit tired during the day but easily aroused. No sign of acute dangerousness. His blood sugars do run slightly high around 144. No other physical problems identified. Principal Problem: Bipolar affective disorder, currently manic, severe, with psychotic features (HCC) Diagnosis:   Patient Active Problem List   Diagnosis Date Noted  . Affective psychosis, bipolar (HCC) [F31.9]   . Hyponatremia [E87.1] 10/06/2015  . Rash and nonspecific skin eruption [R21] 10/06/2015  . Schizoaffective disorder (HCC) [F25.9] 10/06/2015  . Bipolar affective disorder, currently manic, severe, with psychotic features (HCC) [F31.2] 11/28/2014  . HTN (hypertension) [I10] 11/28/2014  . Dyslipidemia [E78.5] 11/28/2014  . Obesity [E66.9] 11/28/2014  . Diabetes (HCC) [E11.9] 11/28/2014  . Tobacco use disorder [F17.200] 11/28/2014   Total Time spent with patient: 25 minutes  Past Psychiatric History: Patient has a history of schizoaffective disorder and or bipolar disorder and has had admissions with psychotic features and mania in the past. No history of suicide attempts. History of bizarre behavior and medicine noncompliance.  Past Medical History:  Past Medical History  Diagnosis Date  . Diabetes (HCC)   . Obesity   . Dyslipidemia   . HTN (hypertension)   . Schizo affective schizophrenia (HCC)   . Paranoia (HCC)   . Bipolar 1 disorder [facility])     Past Surgical History  Procedure Laterality Date  . Tonsillectomy    . Appendectomy     Family History: History reviewed. No pertinent family history. Family Psychiatric  History: No family history known of any mental health problems Social History:  History  Alcohol Use No     History  Drug Use No    Social History    Social History  . Marital Status: Single    Spouse Name: N/A  . Number of Children: N/A  . Years of Education: N/A   Social History Main Topics  . Smoking status: Current Every Day Smoker -- 1.00 packs/day    Types: Cigarettes  . Smokeless tobacco: Current User    Types: Snuff  . Alcohol Use: No  . Drug Use: No  . Sexual Activity: No   Other Topics Concern  . None   Social History Narrative   Additional Social History:    Pain Medications: see PTA meds Prescriptions: see PTA meds Over the Counter: see PTA meds History of alcohol / drug use?: No history of alcohol / drug abuse Longest period of sobriety (when/how long):  (n/a) Negative Consequences of Use:  (n/a) Withdrawal Symptoms:  (n/a)                    Sleep: Fair  Appetite:  Good  Current Medications: Current Facility-Administered Medications  Medication Dose Route Frequency Provider Last Rate Last Dose  . acetaminophen (TYLENOL) tablet 650 mg  650 mg Oral Q6H PRN Audery Amel, MD      . alum & mag hydroxide-simeth (MAALOX/MYLANTA) 200-200-20 MG/5ML suspension 30 mL  30 mL Oral Q4H PRN Audery Amel, MD   30 mL at 10/08/15 2103  . amantadine (SYMMETREL) capsule 100 mg  100 mg Oral BID Audery Amel, MD   100 mg at 10/09/15 0925  . clonazePAM (KLONOPIN) tablet 1 mg  1 mg Oral TID PRN Audery Amel, MD      .  divalproex (DEPAKOTE) DR tablet 500 mg  500 mg Oral Q12H Audery Amel, MD   500 mg at 10/09/15 0927  . lithium carbonate (ESKALITH) CR tablet 450 mg  450 mg Oral Q12H Audery Amel, MD   450 mg at 10/09/15 0927  . magnesium hydroxide (MILK OF MAGNESIA) suspension 30 mL  30 mL Oral Daily PRN Audery Amel, MD   30 mL at 10/07/15 0831  . metFORMIN (GLUCOPHAGE) tablet 1,000 mg  1,000 mg Oral BID WC Audery Amel, MD   1,000 mg at 10/09/15 0926  . metoprolol succinate (TOPROL-XL) 24 hr tablet 50 mg  50 mg Oral Daily Audery Amel, MD   50 mg at 10/09/15 0927  . nicotine (NICODERM CQ - dosed  in mg/24 hours) patch 21 mg  21 mg Transdermal Daily Shari Prows, MD   21 mg at 10/09/15 0927  . paliperidone (INVEGA) 24 hr tablet 6 mg  6 mg Oral Daily Shari Prows, MD   6 mg at 10/09/15 0927  . simvastatin (ZOCOR) tablet 10 mg  10 mg Oral q1800 Audery Amel, MD   10 mg at 10/08/15 1702  . temazepam (RESTORIL) capsule 30 mg  30 mg Oral QHS Shari Prows, MD   30 mg at 10/08/15 2103  . trihexyphenidyl (ARTANE) tablet 2 mg  2 mg Oral QHS Audery Amel, MD   2 mg at 10/08/15 2104    Lab Results:  Results for orders placed or performed during the hospital encounter of 10/06/15 (from the past 48 hour(s))  Glucose, capillary     Status: Abnormal   Collection Time: 10/08/15  6:38 AM  Result Value Ref Range   Glucose-Capillary 165 (H) 65 - 99 mg/dL  Glucose, capillary     Status: Abnormal   Collection Time: 10/08/15 11:57 AM  Result Value Ref Range   Glucose-Capillary 191 (H) 65 - 99 mg/dL  Glucose, capillary     Status: Abnormal   Collection Time: 10/09/15  6:25 AM  Result Value Ref Range   Glucose-Capillary 144 (H) 65 - 99 mg/dL    Blood Alcohol level:  Lab Results  Component Value Date   ETH <5 10/06/2015   ETH <5 08/27/2015    Physical Findings: AIMS: Facial and Oral Movements Muscles of Facial Expression: None, normal Lips and Perioral Area: None, normal Jaw: None, normal Tongue: None, normal,Extremity Movements Upper (arms, wrists, hands, fingers): None, normal Lower (legs, knees, ankles, toes): None, normal, Trunk Movements Neck, shoulders, hips: None, normal, Overall Severity Severity of abnormal movements (highest score from questions above): None, normal Incapacitation due to abnormal movements: None, normal Patient's awareness of abnormal movements (rate only patient's report): No Awareness, Dental Status Current problems with teeth and/or dentures?: No Does patient usually wear dentures?: No  CIWA:    COWS:      Musculoskeletal: Strength & Muscle Tone: within normal limits Gait & Station: normal Patient leans: N/A  Psychiatric Specialty Exam: Review of Systems  Constitutional: Negative.   HENT: Negative.   Eyes: Negative.   Respiratory: Negative.   Cardiovascular: Negative.   Gastrointestinal: Negative.   Musculoskeletal: Negative.   Skin: Negative.   Neurological: Negative.   Psychiatric/Behavioral: Negative for depression, suicidal ideas, hallucinations, memory loss and substance abuse. The patient is not nervous/anxious and does not have insomnia.     Blood pressure 130/83, pulse 100, temperature 98.3 F (36.8 C), temperature source Oral, resp. rate 20, height  (1.778 m), weight  117.028 kg (258 lb), SpO2 95 %.Body mass index is 37.02 kg/(m^2).  General Appearance: Disheveled  Eye SolicitorContact::  Fair  Speech:  Normal Rate  Volume:  Decreased  Mood:  Euthymic  Affect:  Inappropriate  Thought Process:  Circumstantial  Orientation:  Full (Time, Place, and Person)  Thought Content:  Negative  Suicidal Thoughts:  No  Homicidal Thoughts:  No  Memory:  Immediate;   Good Recent;   Fair Remote;   Fair  Judgement:  Fair  Insight:  Fair  Psychomotor Activity:  Normal  Concentration:  Fair  Recall:  Fis[employer]Fair  Fund of Knowledge:Fair  Language: Fair  Akathisia:  Negative  Handed:  Ambidextrous  AIMS (if indicated):     Assets:  Communication Skills Desire for Improvement Financial Resources/Insurance Housing Resilience Social Support  ADL's:  Intact  Cognition: WNL  Sleep:  Number of Hours: 5.5   Treatment Plan Summary: Daily contact with patient to assess and evaluate symptoms and progress in treatment, Medication management and Plan Patient with bipolar disorder or schizoaffective disorder. He appears to stabilize. Probably was off his medicine. Currently not showing obvious psychotic symptoms not acting in a manic way. Fairly lucid. Still not the best self-care but he is  staying reasonably clean. Shows some good insight. Some interaction with others. Encourage patient to continue with current medicine. May be ready for discharge fairly soon.  Mordecai RasmussenJohn [name], MD 10/09/2015, 3:00 PM

## 2015-10-09 NOTE — [facility] Group Notes (Signed)
[facility] LCSW Group Therapy  10/09/2015 12:53 PM  Type of Therapy:  Group Therapy  Participation Level:  Minimal  Participation Quality:  Drowsy   Affect:  Flat   Cognitive:  Disorganized   Insight:  Limited  Engagement in Therapy:  Limited  Modes of Intervention:  Discussion, Education, Socialization and Support  Summary of Progress/Problems:Self esteem: Patients discussed self esteem and how it impacts them. They discussed what aspects in their lives has influenced their self esteem. They were challenged to identify changes that are needed in order to improve self esteem.  Pt attended group but did not stay the entire time. He had difficulties staying awake.   Daisy Floroandace L Ming Mcmannis MSW, LCSWA  10/09/2015, 12:53 PM

## 2015-10-09 NOTE — Plan of Care (Signed)
Problem: Alteration in thought process Goal: LTG-Patient is able to perceive the environment accurately Outcome: Progressing When asked to come to the medication room patient is able to do so.

## 2015-10-10 LAB — GLUCOSE, CAPILLARY: Glucose-Capillary: 204 mg/dL — ABNORMAL HIGH (ref 65–99)

## 2015-10-10 MED ORDER — PRIMIDONE 50 MG PO TABS
50.0000 mg | ORAL_TABLET | Freq: Four times a day (QID) | ORAL | Status: DC
  Administered 2015-10-10 – 2015-10-12 (×9): 50 mg via ORAL
  Filled 2015-10-10 (×11): qty 1

## 2015-10-10 NOTE — Progress Notes (Signed)
Patient cooperative during shift. Patient denies SI/HI/AVH. Patient medication compliant and voices no concerns during shift. Patient pacing during sleep hours but able to return back to room. No unsafe behaviors noted.

## 2015-10-10 NOTE — Plan of Care (Signed)
Problem: Ineffective individual coping Goal: LTG: Patient will report a decrease in negative feelings Outcome: Progressing Patient reports feeling better today than he did yesterday     

## 2015-10-10 NOTE — Progress Notes (Signed)
D:  Patient is alert and oriented on the unit this shift.  Patient did not attend groups today.  Patient denies suicidal ideation, homicidal ideation, auditory or visual hallucinations at the present time.  Patient reports feeling very depressed today. A:  Scheduled medications are administered to patient as per MD orders.  Emotional support and encouragement are provided.  Patient is maintained on q.15 minute safety checks.  Patient is informed to notify staff with questions or concerns. R:  No adverse medication reactions are noted.  Patient is cooperative with medication administration and treatment plan today.  Patient is receptive, calm and cooperative on the unit at this time.  Patient interacts minimally with others on the unit this shift.  Patient contracts for safety at this time.  Patient remains safe at this time.

## 2015-10-10 NOTE — [facility] Group Notes (Signed)
[facility] LCSW Aftercare Discharge Planning Group Note  10/10/2015 9:30 AM  Participation Quality: Did Not Attend. Patient invited to participate but declined.   Shon Indelicato F. Arvid Marengo, MSW, LCSWA, LCAS   

## 2015-10-10 NOTE — [facility] Group Notes (Signed)
[facility] Group Notes:  (Nursing/MHT/Case Management/Adjunct)  Date:  10/10/2015  Time:  12:21 PM  Type of Therapy:  Psychoeducational Skills  Participation Level:  None  Participation Quality:  Drowsy and Inattentive  Affect:  Flat  Cognitive:  Appropriate  Insight:  Limited  Engagement in Group:  Limited and Off Topic  Modes of Intervention:  Discussion and Education  Summary of Progress/Problems:  Greg [name] 10/10/2015, 12:21 PM

## 2015-10-10 NOTE — [facility] Group Notes (Signed)
[facility] Group Notes:  (Nursing/MHT/Case Management/Adjunct)  Date:  10/10/2015  Time:  9:13 PM  Type of Therapy:  Group Therapy  Participation Level:  Active  Participation Quality:  Appropriate  Affect:  Appropriate  Cognitive:  Appropriate  Insight:  Appropriate  Engagement in Group:  Engaged  Modes of Intervention:  Discussion  Summary of Progress/Problems:  Greg [name] 10/10/2015, 9:13 PM

## 2015-10-10 NOTE — Progress Notes (Signed)
Recreation Therapy Notes  Date: 03.13.17 Time: 3:00 pm Location: Craft Room  Group Topic: Wellness  Goal Area(s) Addresses:  Patient will identify at least one item per dimension of health. Patient will examine areas they are deficient in.  Behavioral Response: Left early  Intervention: 6 Dimensions of Health  Activity: Patients were given a worksheet with the definitions of the 6 Dimensions of Health. Patients were given a pie chart with the 6 dimensions of healthy in each section and instructed to write 2-3 things they were currently doing in each section.  Education: LRT educated patients on ways they can increase each area.  Education Outcome: Patient left before LRT educated group.   Clinical Observations/Feedback: Patient left group at approximately 3:10 pm stating he was having a hard time concentrating due to all the medication he was on.  Jacquelynn CreeGreene,Biannca Scantlin M, LRT/CTRS 10/10/2015 4:09 PM

## 2015-10-10 NOTE — Progress Notes (Signed)
[facility] MD Progress Note  10/10/2015 11:38 AM Greg [name]  MRN:  [id number]  Subjective:  Greg [name] reports much improvement. He feels calmer, no racing thoughts, no agitation. He also started developing more insight into his problems and realizes that his attempts to [facility] were part of his delusional system. He accepts medications and seems to tolerate them well. There are no somatic complaints except for tremor which is most likely of family type. His grandfather and other males in the family have similar symptoms. Social worker spoke with his mother at length today. The mother noticed that he started gets worse in the last week before injection of [place]. She tries to give him more oral intake during the last week. She also noticed that in the same period of time he tries to avoid taking lithium or Depakote. We will suggest to give Invega Sustenna injections every 3 weeks instead of every 4 weeks.  \Principal Problem: Bipolar affective disorder, currently manic, severe, with psychotic features (HCC) Diagnosis:   Patient Active Problem List   Diagnosis Date Noted  . Hyponatremia [E87.1] 10/06/2015  . Bipolar affective disorder, currently manic, severe, with psychotic features (HCC) [F31.2] 11/28/2014  . HTN (hypertension) [I10] 11/28/2014  . Dyslipidemia [E78.5] 11/28/2014  . Obesity [E66.9] 11/28/2014  . Diabetes (HCC) [E11.9] 11/28/2014  . Tobacco use disorder [F17.200] 11/28/2014   Total Time spent with patient: 20 minutes  Past Psychiatric History: Bipolar disorder.  Past Medical History:  Past Medical History  Diagnosis Date  . Diabetes (HCC)   . Obesity   . Dyslipidemia   . HTN (hypertension)   . Schizo affective schizophrenia (HCC)   . Paranoia (HCC)   . Bipolar 1 disorder [facility])     Past Surgical History  Procedure Laterality Date  . Tonsillectomy    . Appendectomy     Family History: History reviewed. No pertinent family history. Family  Psychiatric  History: See H&P. Social History:  History  Alcohol Use No     History  Drug Use No    Social History   Social History  . Marital Status: Single    Spouse Name: N/A  . Number of Children: N/A  . Years of Education: N/A   Social History Main Topics  . Smoking status: Current Every Day Smoker -- 1.00 packs/day    Types: Cigarettes  . Smokeless tobacco: Current User    Types: Snuff  . Alcohol Use: No  . Drug Use: No  . Sexual Activity: No   Other Topics Concern  . None   Social History Narrative   Additional Social History:    Pain Medications: see PTA meds Prescriptions: see PTA meds Over the Counter: see PTA meds History of alcohol / drug use?: No history of alcohol / drug abuse Longest period of sobriety (when/how long):  (n/a) Negative Consequences of Use:  (n/a) Withdrawal Symptoms:  (n/a)                    Sleep: Fair  Appetite:  Fair  Current Medications: Current Facility-Administered Medications  Medication Dose Route Frequency Provider Last Rate Last Dose  . acetaminophen (TYLENOL) tablet 650 mg  650 mg Oral Q6H PRN Audery Amel, MD      . alum & mag hydroxide-simeth (MAALOX/MYLANTA) 200-200-20 MG/5ML suspension 30 mL  30 mL Oral Q4H PRN Audery Amel, MD   30 mL at 10/08/15 2103  . amantadine (SYMMETREL) capsule 100 mg  100 mg Oral  BID Audery Amel, MD   100 mg at 10/10/15 1056  . clonazePAM (KLONOPIN) tablet 1 mg  1 mg Oral TID PRN Audery Amel, MD      . divalproex (DEPAKOTE) DR tablet 500 mg  500 mg Oral Q12H Audery Amel, MD   500 mg at 10/10/15 1055  . lithium carbonate (ESKALITH) CR tablet 450 mg  450 mg Oral Q12H Audery Amel, MD   450 mg at 10/10/15 1056  . magnesium hydroxide (MILK OF MAGNESIA) suspension 30 mL  30 mL Oral Daily PRN Audery Amel, MD   30 mL at 10/07/15 0831  . metFORMIN (GLUCOPHAGE) tablet 1,000 mg  1,000 mg Oral BID WC Audery Amel, MD   1,000 mg at 10/10/15 0757  . metoprolol succinate  (TOPROL-XL) 24 hr tablet 50 mg  50 mg Oral Daily Audery Amel, MD   50 mg at 10/10/15 1055  . nicotine (NICODERM CQ - dosed in mg/24 hours) patch 21 mg  21 mg Transdermal Daily Shari Prows, MD   21 mg at 10/09/15 0927  . paliperidone (INVEGA) 24 hr tablet 6 mg  6 mg Oral Daily Jazira Maloney B Jaaron Oleson, MD   6 mg at 10/10/15 1056  . primidone (MYSOLINE) tablet 50 mg  50 mg Oral 4 times per day Shari Prows, MD      . simvastatin (ZOCOR) tablet 10 mg  10 mg Oral q1800 Audery Amel, MD   10 mg at 10/09/15 1704  . temazepam (RESTORIL) capsule 30 mg  30 mg Oral QHS Shari Prows, MD   30 mg at 10/09/15 2107  . trihexyphenidyl (ARTANE) tablet 2 mg  2 mg Oral QHS Audery Amel, MD   2 mg at 10/09/15 2108    Lab Results:  Results for orders placed or performed during the hospital encounter of 10/06/15 (from the past 48 hour(s))  Glucose, capillary     Status: Abnormal   Collection Time: 10/08/15 11:57 AM  Result Value Ref Range   Glucose-Capillary 191 (H) 65 - 99 mg/dL  Glucose, capillary     Status: Abnormal   Collection Time: 10/09/15  6:25 AM  Result Value Ref Range   Glucose-Capillary 144 (H) 65 - 99 mg/dL  Glucose, capillary     Status: Abnormal   Collection Time: 10/10/15  9:50 AM  Result Value Ref Range   Glucose-Capillary 204 (H) 65 - 99 mg/dL    Blood Alcohol level:  Lab Results  Component Value Date   ETH <5 10/06/2015   ETH <5 08/27/2015    Physical Findings: AIMS: Facial and Oral Movements Muscles of Facial Expression: None, normal Lips and Perioral Area: None, normal Jaw: None, normal Tongue: None, normal,Extremity Movements Upper (arms, wrists, hands, fingers): None, normal Lower (legs, knees, ankles, toes): None, normal, Trunk Movements Neck, shoulders, hips: None, normal, Overall Severity Severity of abnormal movements (highest score from questions above): None, normal Incapacitation due to abnormal movements: None, normal Patient's  awareness of abnormal movements (rate only patient's report): No Awareness, Dental Status Current problems with teeth and/or dentures?: No Does patient usually wear dentures?: No  CIWA:    COWS:     Musculoskeletal: Strength & Muscle Tone: within normal limits Gait & Station: normal Patient leans: N/A  Psychiatric Specialty Exam: Review of Systems  Neurological: Positive for tremors.  All other systems reviewed and are negative.   Blood pressure 149/95, pulse 95, temperature 98.3 F (36.8 C), temperature source  Oral, resp. rate 20, height 5\' 10"  (1.778 m), weight 117.028 kg (258 lb), SpO2 95 %.Body mass index is 37.02 kg/(m^2).  General Appearance: Casual  Eye Contact::  Good  Speech:  Clear and Coherent  Volume:  Normal  Mood:  Euphoric  Affect:  Congruent  Thought Process:  Disorganized  Orientation:  Full (Time, Place, and Person)  Thought Content:  Delusions, Hallucinations: Auditory and Paranoid Ideation  Suicidal Thoughts:  No  Homicidal Thoughts:  No  Memory:  Immediate;   Fair Recent;   Fair Remote;   Fair  Judgement:  Fair  Insight:  Shallow  Psychomotor Activity:  Normal  Concentration:  Fair  Recall:  FiservFair  Fund of Knowledge:Fair  Language: Fair  Akathisia:  No  Handed:  Right  AIMS (if indicated):     Assets:  Communication Skills Desire for Improvement Financial Resources/Insurance Housing Physical Health Resilience Social Support  ADL's:  Intact  Cognition: WNL  Sleep:  Number of Hours: 6.25   Treatment Plan Summary: Daily contact with patient to assess and evaluate symptoms and progress in treatment and Medication management   Greg [name] is a 50 year old male with a history of bipolar disorder admitted for manic episode.  1. Mood and psychosis. He has been maintained on [place] Sustenna injections but it is not clear if he was compliant. We will start oral Invega. We will continue lithium and Depakote. Li and VPA level in am.   2. Tremor. It  is unclear if this is Parkinson's, familial or lithium/depakote tremor. He is on Amantadine and Artane. Will start Primidone.  3. Diabetes. We continued metformin, ADA diet and blood glucose monitoring.  4. Smoking. Nicotine patch is available.  5. Hypertension. He is on metoprolol.   6. Metabolic syndrome monitoring. Lipid profile and TSH are normal. Hemoglobin A1c is elevated. PRL 45.   7. Insomnia. He sleptonly 4 hours with Trazodone. We offer Restoril.  8. Hyponatremia. Likely from excessive drinking and or depakote. We encourage limited fluid intake. Will recheck Na level.  9. Discharge planning. He will be discharged to home with his parents. [facility]  Kristine LineaJolanta Kowen Kluth, MD 10/10/2015, 11:38 AM

## 2015-10-10 NOTE — Plan of Care (Signed)
Problem: Ineffective individual coping Goal: STG: Patient will remain free from self harm Outcome: Progressing Patient remains free from self harm currently     

## 2015-10-10 NOTE — [facility] Group Notes (Signed)
[facility] LCSW Group Therapy   10/10/2015 1pm  Type of Therapy: Group Therapy   Participation Level: Did Not Attend. Patient invited to participate but declined.    Lache Dagher F. Renika Shiflet, MSW, LCSWA, LCAS   

## 2015-10-11 LAB — COMPREHENSIVE METABOLIC PANEL
ALT: 26 U/L (ref 17–63)
AST: 22 U/L (ref 15–41)
Albumin: 3.7 g/dL (ref 3.5–5.0)
Alkaline Phosphatase: 48 U/L (ref 38–126)
Anion gap: 7 (ref 5–15)
BUN: 11 mg/dL (ref 6–20)
CO2: 26 mmol/L (ref 22–32)
Calcium: 9.1 mg/dL (ref 8.9–10.3)
Chloride: 101 mmol/L (ref 101–111)
Creatinine, Ser: 1.02 mg/dL (ref 0.61–1.24)
GFR calc Af Amer: >60 mL/min (ref >60)
GFR calc non Af Amer: >60 mL/min (ref >60)
Glucose, Bld: 154 mg/dL — ABNORMAL HIGH (ref 65–99)
Potassium: 4.8 mmol/L (ref 3.5–5.1)
Sodium: 134 mmol/L — ABNORMAL LOW (ref 135–145)
Total Bilirubin: 0.6 mg/dL (ref 0.3–1.2)
Total Protein: 7 g/dL (ref 6.5–8.1)

## 2015-10-11 LAB — LITHIUM LEVEL: Lithium Lvl: 0.78 mmol/L (ref 0.60–1.20)

## 2015-10-11 LAB — GLUCOSE, CAPILLARY: Glucose-Capillary: 140 mg/dL — ABNORMAL HIGH (ref 65–99)

## 2015-10-11 LAB — VALPROIC ACID LEVEL: Valproic Acid Lvl: 49 ug/mL — ABNORMAL LOW (ref 50.0–100.0)

## 2015-10-11 MED ORDER — DIVALPROEX SODIUM 500 MG PO DR TAB
750.0000 mg | DELAYED_RELEASE_TABLET | Freq: Two times a day (BID) | ORAL | Status: DC
  Administered 2015-10-11 – 2015-10-12 (×3): 750 mg via ORAL
  Filled 2015-10-11 (×3): qty 1

## 2015-10-11 NOTE — [facility] Group Notes (Signed)
[facility] Group Notes:  (Nursing/MHT/Case Management/Adjunct)  Date:  10/11/2015  Time:  2:56 PM  Type of Therapy:  Psychoeducational Skills  Participation Level:  Did Not Attend   Greg [name] [name] 10/11/2015, 2:56 PM 

## 2015-10-11 NOTE — Progress Notes (Signed)
D: Patient appears very animated in the milieu. Patient sill disorganized and delusional. He denies SI/HI/AVH. He also denies pain. A: Medication was given with education. Encouragement provided.  R: Patient was compliant with medication. He has remained calm and cooperative. Safety maintained with 15 min checks.  

## 2015-10-11 NOTE — Progress Notes (Signed)
[facility] MD Progress Note  10/11/2015 9:16 AM Greg [name]  MRN:  [id number]  Subjective: Greg [name] reports further improvement. His mood is more stable, affect improving. He is no longer preoccupied with his paranoid delusions. He is no longer hyper religious or preoccupied with his marriage to [place]. He has more insight into his problems. He denies hallucinations. He interacts with peers and staff appropriately. He accepts medications and tolerates them well. There is still significant tremor that is probably familial. We started Primidone yesterday with apparent improvement. Sleep and appetite are fair. There are no somatic complaints. He participates well in groups.   Principal Problem: Bipolar affective disorder, currently manic, severe, with psychotic features ([place]) Diagnosis:   Patient Active Problem List   Diagnosis Date Noted  . Affective psychosis, bipolar ([place]) [F31.9]   . Hyponatremia [E87.1] 10/06/2015  . Bipolar affective disorder, currently manic, severe, with psychotic features ([place]) [F31.2] 11/28/2014  . HTN (hypertension) [I10] 11/28/2014  . Dyslipidemia [E78.5] 11/28/2014  . Obesity [E66.9] 11/28/2014  . Diabetes ([place]) [E11.9] 11/28/2014  . Tobacco use disorder [F17.200] 11/28/2014   Total Time spent with patient: 20 minutes  Past Psychiatric History: Bipolar disorder.  Past Medical History:  Past Medical History  Diagnosis Date  . Diabetes ([place])   . Obesity   . Dyslipidemia   . HTN (hypertension)   . Schizo affective schizophrenia ([place])   . Paranoia ([place])   . Bipolar 1 disorder [facility])     Past Surgical History  Procedure Laterality Date  . Tonsillectomy    . Appendectomy     Family History: History reviewed. No pertinent family history. Family Psychiatric  History: Grandmother with schizophrenia.  Social History:  History  Alcohol Use No     History  Drug Use No    Social History   Social History  . Marital Status: Single    Spouse  Name: N/A  . Number of Children: N/A  . Years of Education: N/A   Social History Main Topics  . Smoking status: Current Every Day Smoker -- 1.00 packs/day    Types: Cigarettes  . Smokeless tobacco: Current User    Types: Snuff  . Alcohol Use: No  . Drug Use: No  . Sexual Activity: No   Other Topics Concern  . None   Social History Narrative   Additional Social History:    Pain Medications: see PTA meds Prescriptions: see PTA meds Over the Counter: see PTA meds History of alcohol / drug use?: No history of alcohol / drug abuse Longest period of sobriety (when/how long):  (n/a) Negative Consequences of Use:  (n/a) Withdrawal Symptoms:  (n/a)                    Sleep: Fair  Appetite:  Fair  Current Medications: Current Facility-Administered Medications  Medication Dose Route Frequency Provider Last Rate Last Dose  . acetaminophen (TYLENOL) tablet 650 mg  650 mg Oral Q6H PRN Gonzella Lex, MD      . alum & mag hydroxide-simeth (MAALOX/MYLANTA) 200-200-20 MG/5ML suspension 30 mL  30 mL Oral Q4H PRN Gonzella Lex, MD   30 mL at 10/08/15 2103  . amantadine (SYMMETREL) capsule 100 mg  100 mg Oral BID Gonzella Lex, MD   100 mg at 10/10/15 2140  . clonazePAM (KLONOPIN) tablet 1 mg  1 mg Oral TID PRN Gonzella Lex, MD      . divalproex (DEPAKOTE) DR tablet 750 mg  750 mg  Oral Q12H Charlayne Vultaggio B Deasha Clendenin, MD      . lithium carbonate (ESKALITH) CR tablet 450 mg  450 mg Oral Q12H Gonzella Lex, MD   450 mg at 10/10/15 2140  . magnesium hydroxide (MILK OF MAGNESIA) suspension 30 mL  30 mL Oral Daily PRN Gonzella Lex, MD   30 mL at 10/07/15 0831  . metFORMIN (GLUCOPHAGE) tablet 1,000 mg  1,000 mg Oral BID WC Gonzella Lex, MD   1,000 mg at 10/11/15 0824  . metoprolol succinate (TOPROL-XL) 24 hr tablet 50 mg  50 mg Oral Daily Gonzella Lex, MD   50 mg at 10/10/15 1055  . nicotine (NICODERM CQ - dosed in mg/24 hours) patch 21 mg  21 mg Transdermal Daily Clovis Fredrickson, MD   21 mg at 10/09/15 0927  . paliperidone (INVEGA) 24 hr tablet 6 mg  6 mg Oral Daily Arlando Leisinger B Daeton Kluth, MD   6 mg at 10/10/15 1056  . primidone (MYSOLINE) tablet 50 mg  50 mg Oral 4 times per day Clovis Fredrickson, MD   50 mg at 10/11/15 0644  . simvastatin (ZOCOR) tablet 10 mg  10 mg Oral q1800 Gonzella Lex, MD   10 mg at 10/10/15 1705  . temazepam (RESTORIL) capsule 30 mg  30 mg Oral QHS Clovis Fredrickson, MD   30 mg at 10/10/15 2140    Lab Results:  Results for orders placed or performed during the hospital encounter of 10/06/15 (from the past 48 hour(s))  Glucose, capillary     Status: Abnormal   Collection Time: 10/10/15  9:50 AM  Result Value Ref Range   Glucose-Capillary 204 (H) 65 - 99 mg/dL  Glucose, capillary     Status: Abnormal   Collection Time: 10/11/15  6:44 AM  Result Value Ref Range   Glucose-Capillary 140 (H) 65 - 99 mg/dL  Valproic acid level     Status: Abnormal   Collection Time: 10/11/15  6:54 AM  Result Value Ref Range   Valproic Acid Lvl 49 (L) 50.0 - 100.0 ug/mL  Lithium level     Status: None   Collection Time: 10/11/15  6:54 AM  Result Value Ref Range   Lithium Lvl 0.78 0.60 - 1.20 mmol/L  Comprehensive metabolic panel     Status: Abnormal   Collection Time: 10/11/15  6:54 AM  Result Value Ref Range   Sodium 134 (L) 135 - 145 mmol/L   Potassium 4.8 3.5 - 5.1 mmol/L   Chloride 101 101 - 111 mmol/L   CO2 26 22 - 32 mmol/L   Glucose, Bld 154 (H) 65 - 99 mg/dL   BUN 11 6 - 20 mg/dL   Creatinine, Ser 1.02 0.61 - 1.24 mg/dL   Calcium 9.1 8.9 - 10.3 mg/dL   Total Protein 7.0 6.5 - 8.1 g/dL   Albumin 3.7 3.5 - 5.0 g/dL   AST 22 15 - 41 U/L   ALT 26 17 - 63 U/L   Alkaline Phosphatase 48 38 - 126 U/L   Total Bilirubin 0.6 0.3 - 1.2 mg/dL   GFR calc non Af Amer >60 >60 mL/min   GFR calc Af Amer >60 >60 mL/min    Comment: (NOTE) The eGFR has been calculated using the CKD EPI equation. This calculation has not been validated in all  clinical situations. eGFR's persistently <60 mL/min signify possible Chronic Kidney Disease.    Anion gap 7 5 - 15    Blood Alcohol level:  Lab Results  Component Value Date   ETH <5 10/06/2015   ETH <5 08/27/2015    Physical Findings: AIMS: Facial and Oral Movements Muscles of Facial Expression: None, normal Lips and Perioral Area: None, normal Jaw: None, normal Tongue: None, normal,Extremity Movements Upper (arms, wrists, hands, fingers): None, normal Lower (legs, knees, ankles, toes): None, normal, Trunk Movements Neck, shoulders, hips: None, normal, Overall Severity Severity of abnormal movements (highest score from questions above): None, normal Incapacitation due to abnormal movements: None, normal Patient's awareness of abnormal movements (rate only patient's report): No Awareness, Dental Status Current problems with teeth and/or dentures?: No Does patient usually wear dentures?: No  CIWA:    COWS:     Musculoskeletal: Strength & Muscle Tone: within normal limits Gait & Station: normal Patient leans: N/A  Psychiatric Specialty Exam: Review of Systems  Neurological: Positive for tremors.  All other systems reviewed and are negative.   Blood pressure 155/93, pulse 88, temperature 97.8 F (36.6 C), temperature source Oral, resp. rate 20, height 5' 10"  (1.778 m), weight 117.028 kg (258 lb), SpO2 95 %.Body mass index is 37.02 kg/(m^2).  General Appearance: Casual  Eye Contact::  Good  Speech:  Clear and Coherent  Volume:  Normal  Mood:  Euphoric  Affect:  Congruent  Thought Process:  Disorganized  Orientation:  Full (Time, Place, and Person)  Thought Content:  Delusions and Paranoid Ideation  Suicidal Thoughts:  No  Homicidal Thoughts:  No  Memory:  Immediate;   Fair Recent;   Fair Remote;   Fair  Judgement:  Poor  Insight:  Lacking  Psychomotor Activity:  Increased  Concentration:  Fair  Recall:  [employer] of Knowledge:Fair  Language: Fair   Akathisia:  No  Handed:  Right  AIMS (if indicated):     Assets:  Communication Skills Desire for Improvement Financial Resources/Insurance Housing Physical Health Resilience Social Support  ADL's:  Intact  Cognition: WNL  Sleep:  Number of Hours: 5.5   Treatment Plan Summary: Daily contact with patient to assess and evaluate symptoms and progress in treatment and Medication management   Greg [name] is a 50 year old male with a history of bipolar disorder admitted for manic episode.  1. Mood and psychosis. He has been maintained on [place] injections but apparently he becomes psychotic in the fourth week of treatment. He will benefit from injections of [place] every 3 weeks. We started oral Invega. We continued lithium and Depakote. Li level today was 0.78 and VPA was 49. We will increase Depakote dose to 725 mg bid.   2. Tremor. It is unclear if this is Parkinson's, familial or lithium/depakote tremor. We started Primidone, continued Amantadine and stopped Artane.   3. Diabetes. We continued metformin, ADA diet and blood glucose monitoring.  4. Smoking. Nicotine patch is available.  5. Hypertension. He is on metoprolol.   6. Metabolic syndrome monitoring. Lipid profile and TSH are normal. Hemoglobin A1c is elevated. PRL 45.   7. Insomnia. He did not respond well to Trazodone. He slept 5.5 hours with Restoril.  8. Hyponatremia. Likely from excessive drinking and or depakote. We encourage limited fluid intake. Na level 134.  9. Discharge planning. He will be discharged to home with his parents. He will follow up with his psychiatrist at [facility].  Orson Slick, MD 10/11/2015, 9:16 AM

## 2015-10-11 NOTE — Plan of Care (Signed)
Problem: Alteration in thought process Goal: LTG-Patient has not harmed self or others in at least 2 days Outcome: Progressing Patient has remained free from harm since admission     

## 2015-10-11 NOTE — Plan of Care (Signed)
Problem: Consults Goal: [facility] General Treatment Patient Education Outcome: Progressing Cooperative with plan of care.      

## 2015-10-11 NOTE — Tx Team (Signed)
Interdisciplinary Treatment Plan Update (Adult)  Date:  10/11/2015 Time Reviewed:  5:01 PM  Progress in Treatment: Attending groups: Yes. Participating in groups:  Yes. Taking medication as prescribed:  Yes. Tolerating medication:  Yes. Family/Significant othe contact made:  Yes, individual(s) contacted:  Mother, Greg [name] Patient understands diagnosis:  Yes. Discussing patient identified problems/goals with staff:  Yes. Medical problems stabilized or resolved:  Yes. Denies suicidal/homicidal ideation: Yes. Issues/concerns per patient self-inventory:  No. Other:  New problem(s) identified: No, Describe:     Discharge Plan or Barriers:Pt will follow up with [facility] or be referred to new provider. Pt will return to home with mother.  Reason for Continuation of Hospitalization: Delusions  Hallucinations Medication stabilization  Comments:Greg [name] reports further improvement. His mood is more stable, affect improving. He is no longer preoccupied with his paranoid delusions. He is no longer hyper religious or preoccupied with his marriage to [place]. He has more insight into his problems. He denies hallucinations. He interacts with peers and staff appropriately. He accepts medications and tolerates them well. There is still significant tremor that is probably familial. We started Primidone yesterday with apparent improvement. Sleep and appetite are fair. There are no somatic complaints. He participates well in groups.   Estimated length of stay:3-5 days  New goal(s):  Review of initial/current patient goals per problem list:   1.  Goal(s):Pt will participate in aftercare plan.  Met:  No  Target date:dsicharge  As evidenced HW:TUUE for housing and psychiatric follow up being identified  2.  Goal (s):Pt will demonstrate decrease in psychotic symptoms.  Met:  No  Target date:discharge  As evidenced [id number] in signs and symptoms of psychosis OR Pt is deemed  stable or at baseline by MD  3.  Goal(s):Patient will exhibit decreased signs and symptoms of Mania  Met:  No  Target date:discharge  As evidenced XT:AVWP organized thought process, less intrusive, less tangential, sleeping appropriately OR be deemed stable by MD.  Attendees: Patient:   3/14/20175:01 PM  Family:   3/14/20175:01 PM  Physician:  Orson Slick 3/14/20175:01 PM  Nursing:   Delsa Sale RN 3/14/20175:01 PM  Case Manager:   3/14/20175:01 PM  Counselor:  Dossie Arbour, LCSW 3/14/20175:01 PM  Other:  Everitt Amber, [name] 3/14/20175:01 PM  Other:   3/14/20175:01 PM  Other:   3/14/20175:01 PM  Other:  3/14/20175:01 PM  Other:  3/14/20175:01 PM  Other:  3/14/20175:01 PM  Other:  3/14/20175:01 PM  Other:  3/14/20175:01 PM  Other:  3/14/20175:01 PM  Other:   3/14/20175:01 PM   Scribe for Treatment Team:   Greg [name], 10/11/2015, 5:01 PM, MSW, LCSW

## 2015-10-11 NOTE — Progress Notes (Signed)
Patient with appropriate affect, cooperative behavior with meals, meds and plan of care. No SI/HI at this time. Pleasant with peers, verbalizes needs appropriately with peers. Safety maintained.

## 2015-10-11 NOTE — [facility] Group Notes (Deleted)
[facility] LCSW Group Therapy   10/11/2015 1pm  Type of Therapy: Group Therapy   Participation Level: Did Not Attend. Patient invited to participate but declined.    Daemyn Gariepy F. Torry Istre, MSW, LCSWA, LCAS   

## 2015-10-11 NOTE — Progress Notes (Signed)
Recreation Therapy Notes  Date: 03.14.17 Time: 3:00 pm Location: Craft Room  Group Topic: Self-expression  Goal Area(s) Addresses:  Patient will be able to identify a color that represents each emotion. Patient will verbalize benefit of using art as a means of self-expression. Patient will verbalize one positive emotion experienced while participating in group.  Behavioral Response: Arrived late, Attentive  Intervention: The Colors Within Me  Activity: Patients were given a blank face worksheet and instructed to pick a color for each emotion they were feeling and to show how much of that emotion they were feeling on the face.  Education: LRT educated patients on other forms of self-expression.  Education Outcome: In group clarification offered   Clinical Observations/Feedback: Patient arrived to group at approximately 3:33 pm. LRT explained activity. Patient drew a face. Patient did not contribute to group discussion.  Jacquelynn CreeGreene,Ellar Hakala M, LRT/CTRS 10/11/2015 4:19 PM

## 2015-10-11 NOTE — [facility] Group Notes (Signed)
[facility] LCSW Group Therapy   10/11/2015 11:00 am  Type of Therapy: Group Therapy   Participation Level: Active   Participation Quality: Attentive, Sharing and Supportive   Affect: Appropriate  Cognitive: Alert and Oriented   Insight: Developing/Improving and Engaged   Engagement in Therapy: Developing/Improving and Engaged   Modes of Intervention: Clarification, Confrontation, Discussion, Education, Exploration,  Limit-setting, Orientation, Problem-solving, Rapport Building, Dance movement psychotherapisteality Testing, Socialization and Support  Summary of Progress/Problems: The topic for group therapy was feelings about diagnosis. Pt actively participated in group discussion on their past and current diagnosis and how they feel towards this. Pt also identified how society and family members judge them, based on their diagnosis as well as stereotypes and stigmas.  Pt shared that currently and in the past the pt has been diagnosed initally as bi-polar and later as schizoaffective.  Pt shared that the pt felt judged by his father.  Pt shared that to cope with these feelings of being judged the pt would listen to music, exercise and use his faith on a daily basis.  Pt presented as having good insight, as evidenced by his laughing with other group members when describing the past events which led to his admittance and reporting that he is now incredulous at the things he did when not on his medications and the pt's sharing was connected to the topics in group at all times. Pt was polite and cooperative with the CSW and other group members and focused and attentive to the topics discussed and the sharing of others.    Greg PeaJonathan F. Breelyn Icard, LCSWA, LCAS

## 2015-10-12 DIAGNOSIS — F3181 Bipolar II disorder: Secondary | ICD-10-CM | POA: Insufficient documentation

## 2015-10-12 DIAGNOSIS — G250 Essential tremor: Secondary | ICD-10-CM | POA: Diagnosis present

## 2015-10-12 DIAGNOSIS — G251 Drug-induced tremor: Secondary | ICD-10-CM | POA: Diagnosis present

## 2015-10-12 LAB — GLUCOSE, CAPILLARY: Glucose-Capillary: 137 mg/dL — ABNORMAL HIGH (ref 65–99)

## 2015-10-12 MED ORDER — PALIPERIDONE ER 6 MG PO TB24: 6.0000 mg | ORAL_TABLET | Freq: Every day | ORAL | Status: DC

## 2015-10-12 MED ORDER — CLONAZEPAM 1 MG PO TABS: 1.0000 mg | ORAL_TABLET | Freq: Three times a day (TID) | ORAL | Status: DC | PRN

## 2015-10-12 MED ORDER — LITHIUM CARBONATE ER 450 MG PO TBCR: 450.0000 mg | EXTENDED_RELEASE_TABLET | Freq: Two times a day (BID) | ORAL | Status: DC

## 2015-10-12 MED ORDER — PALIPERIDONE PALMITATE 234 MG/1.5ML IM SUSP: 234.0000 mg | INTRAMUSCULAR | Status: DC

## 2015-10-12 MED ORDER — METOPROLOL SUCCINATE ER 50 MG PO TB24: 50.0000 mg | ORAL_TABLET | Freq: Every day | ORAL | Status: DC

## 2015-10-12 MED ORDER — TEMAZEPAM 30 MG PO CAPS: 30.0000 mg | ORAL_CAPSULE | Freq: Every day | ORAL | Status: DC

## 2015-10-12 MED ORDER — SIMVASTATIN 10 MG PO TABS: 10.0000 mg | ORAL_TABLET | Freq: Every day | ORAL | Status: DC

## 2015-10-12 MED ORDER — AMANTADINE HCL 100 MG PO CAPS: 100.0000 mg | ORAL_CAPSULE | Freq: Two times a day (BID) | ORAL | Status: DC

## 2015-10-12 MED ORDER — PRIMIDONE 50 MG PO TABS: 50.0000 mg | ORAL_TABLET | Freq: Three times a day (TID) | ORAL | Status: DC

## 2015-10-12 MED ORDER — METFORMIN HCL 1000 MG PO TABS: 1000.0000 mg | ORAL_TABLET | Freq: Two times a day (BID) | ORAL | Status: DC

## 2015-10-12 MED ORDER — DIVALPROEX SODIUM 250 MG PO DR TAB: 750.0000 mg | DELAYED_RELEASE_TABLET | Freq: Two times a day (BID) | ORAL | Status: DC

## 2015-10-12 NOTE — Plan of Care (Signed)
Problem: Ineffective individual coping Goal: LTG: Patient will report a decrease in negative feelings Outcome: Progressing Patient states his mood has improved.

## 2015-10-12 NOTE — [facility] Suicide Risk Assessment (Signed)
[facility]   Principal Problem: Bipolar affective disorder, currently manic, severe, with psychotic features [facility](HCC) Discharge Diagnoses:  Patient Active Problem List   Diagnosis Date Noted  . 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 Hyponatremia [E87.1] 10/06/2015  . Bipolar affective disorder, currently manic, severe, with psychotic features ([facility]) [F31.2] 11/28/2014  . HTN (hypertension) [I10] 11/28/2014  . Dyslipidemia [E78.5] 11/28/2014  . Obesity [E66.9] 11/28/2014  . Diabetes ([facility]) [E11.9] 11/28/2014  . Tobacco use disorder [F17.200] 11/28/2014    Total Time spent with patient: 30 minutes  Musculoskeletal: Strength & Muscle Tone: within normal limits Gait & Station: normal Patient leans: N/A  Psychiatric Specialty Exam: Review of Systems  All other systems reviewed and are negative.   Blood pressure 128/81, pulse 85, temperature 98.7 F (37.1 C), temperature source Oral, resp. rate 20, height 5\' 10"  (1.778 m), weight 117.028 kg (258 lb), SpO2 95 %.Body mass index is 37.02 kg/(m^2).  General Appearance: Casual  Eye Contact::  Good  Speech:  Clear and Coherent409  Volume:  Normal  Mood:  Euthymic  Affect:  Appropriate  Thought Process:  Goal Directed  Orientation:  Full (Time, Place, and Person)  Thought Content:  WDL  Suicidal Thoughts:  No  Homicidal Thoughts:  No  Memory:  Immediate;   Fair Recent;   Fair Remote;   Fair  Judgement:  Fair  Insight:  Shallow  Psychomotor Activity:  Normal  Concentration:  Fair  Recall:  [employer]  Fund of Knowledge:Fair  Language: Fair  Akathisia:  No  Handed:  Right  AIMS (if indicated):     Assets:  Communication Skills Desire for Improvement Financial Resources/Insurance Housing Physical Health Resilience Social Support Transportation  Sleep:  Number of Hours: 6.25  Cognition: WNL  ADL's:  Intact   Mental Status Per Nursing Assessment::   On Admission:  NA  Demographic Factors:   Male, Divorced or widowed and Caucasian  Loss Factors: NA  Historical Factors: Prior suicide attempts, Family history of mental illness or substance abuse and Impulsivity  Risk Reduction Factors:   Sense of responsibility to family, Religious beliefs about death, Living with another person, especially a relative, Positive social support and Positive therapeutic relationship  Continued Clinical Symptoms:  Bipolar Disorder:   Mixed State  Cognitive Features That Contribute To Risk:  None    Suicide Risk:  Minimal: No identifiable suicidal ideation.  Patients presenting with no risk factors but with morbid ruminations; may be classified as minimal risk based on the severity of the depressive symptoms    Plan Of Care/Follow-up recommendations:  Activity:  as tolerated. Diet:  Low sodium heart healthy ADA diet. Other:  Keep follow-up appointments.  Kristine LineaJolanta Tensley Wery, MD 10/12/2015, 12:44 PM

## 2015-10-12 NOTE — Progress Notes (Signed)
Patient to discharge today when discharge plan and transportation in place. 

## 2015-10-12 NOTE — Progress Notes (Signed)
D: Patient appears very animated in the milieu. Patient sill disorganized and delusional. He denies SI/HI/AVH. He also denies pain. A: Medication was given with education. Encouragement provided.  R: Patient was compliant with medication. He has remained calm and cooperative. Safety maintained with 15 min checks.

## 2015-10-12 NOTE — [facility] Group Notes (Signed)
[facility] Group Notes:  (Nursing/MHT/Case Management/Adjunct)  Date:  10/12/2015  Time:  3:07 PM  Type of Therapy:  Psychoeducational Skills  Participation Level:  Active  Participation Quality:  Appropriate, Attentive and Sharing  Affect:  Appropriate  Cognitive:  Appropriate  Insight:  Appropriate  Engagement in Group:  Engaged  Modes of Intervention:  Discussion, Education and Support  Summary of Progress/Problems:  Greg SmokeCara [name] [name] 10/12/2015, 3:07 PM

## 2015-10-12 NOTE — Discharge Summary (Signed)
Physician Discharge Summary Note  Patient:  Greg PonderRichard [name] is an 50 y.o., male MRN:  [id number] DOB:  11/16/1965 Patient phone:  [phone] (home)  Patient address:   [address] [place] [address],  Total Time spent with patient: 30 minutes  Date of Admission:  10/06/2015 Date of Discharge: 10/12/2015  Reason for Admission:  Psychotic break.  Identifying data. [name] is a 50 year old male with a history of bipolar disorder.   Chief complaint. "I have been gagging mymedications when my mother gives it to me."  History of present illness. Information was obtained from the patient and the chart. [name] has a long history of bipolar illness with multiple hospital admissions for manic episodes. He is last hospitalization here in the fall of 2016. He was discharged on a combination of Lithium, depakote and Invega sustenna injections. He claims to be compliant with injection and reports that it was given 3 weeks ago. Li and VPA are sub-therapeutic but decent. He admtts that he has been trying too rid of pills that his mother tried to administer. He was brought to the hospital for manic, psychotic, bizarre, hyperreligiuos behaviors for the past 3 days. He was told by god to organize wedding party for China[place]ittany [name]. The patient himself denies symptoms of depression, anxiety, or psychosis. He denies alcohol or illicit substance use.  Past psychiatric history. There were multiple hospital admissions and multiple medication trials. There were no suicide attempts.  Family psychiatric history. Grandmother with schizophrenia.  Social history. He dropped out of high school but got his GED's and went to technical college. He is divorced. He is disabled from mental illness. He lives with his parents. 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  Principal Problem: Bipolar affective disorder, currently manic, severe, with psychotic features  [facility](HCC) Discharge Diagnoses: Patient Active Problem List   Diagnosis Date Noted  . 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 Hyponatremia [E87.1] 10/06/2015  . Bipolar affective disorder, currently manic, severe, with psychotic features (HCC) [F31.2] 11/28/2014  . HTN (hypertension) [I10] 11/28/2014  . Dyslipidemia [E78.5] 11/28/2014  . Obesity [E66.9] 11/28/2014  . Diabetes (HCC) [E11.9] 11/28/2014  . Tobacco use disorder [F17.200] 11/28/2014    Past Psychiatric History: Bipolar disorder.  Past Medical History:  Past Medical History  Diagnosis Date  . Diabetes (HCC)   . Obesity   . Dyslipidemia   . HTN (hypertension)   . Schizo affective schizophrenia (HCC)   . Paranoia (HCC)   . Bipolar 1 disorder [facility](HCC)     Past Surgical History  Procedure Laterality Date  . Tonsillectomy    . Appendectomy     Family History: History reviewed. No pertinent family history. Family Psychiatric  History: Grandmother with schizophrenia. Social History:  History  Alcohol Use No     History  Drug Use No    Social History   Social History  . Marital Status: Single    Spouse Name: N/A  . Number of Children: N/A  . Years of Education: N/A   Social History Main Topics  . Smoking status: Current Every Day Smoker -- 1.00 packs/day    Types: Cigarettes  . Smokeless tobacco: Current User    Types: Snuff  . Alcohol Use: No  . Drug Use: No  . Sexual Activity: No   Other Topics Concern  . None   Social History Narrative    Hospital Course:    [name] is a 50 year old male  with a history of bipolar disorder admitted in a manic episode.  1. Mood and psychosis. He has been maintained on [place] injections but apparently he becomes psychotic in the fourth week of treatment. He will benefit from injections of [place] given avery 21 days. His last injection was on February 28. We started oral Invega and continued lithium and Depakote. Li level was 0.78 and VPA was  49. We increased Depakote dose to 750 mg bid.   2. Tremor. It is unclear if this is Parkinson's, familial or lithium/depakote tremor. His grandfather had benign tremor. We started Primidone with excellent results. We continued Amantadine and stopped Artane.   3. Diabetes. We continued metformin, ADA diet and blood glucose monitoring.  4. Smoking. Nicotine patch was available.  5. Hypertension. He was on metoprolol.   6. Metabolic syndrome monitoring. Lipid profile and TSH are normal. Hemoglobin A1c is elevated. PRL 45.   7. Insomnia. He did not respond well to Trazodone but slept better with Restoril.  8. Hyponatremia. It was likely from excessive drinking. Na level 134.  9. Discharge planning. He was discharged to home with his parents. He will follow up with [facility] for medication management and day program.   Physical Findings: AIMS: Facial and Oral Movements Muscles of Facial Expression: None, normal Lips and Perioral Area: None, normal Jaw: None, normal Tongue: None, normal,Extremity Movements Upper (arms, wrists, hands, fingers): None, normal Lower (legs, knees, ankles, toes): None, normal, Trunk Movements Neck, shoulders, hips: None, normal, Overall Severity Severity of abnormal movements (highest score from questions above): None, normal Incapacitation due to abnormal movements: None, normal Patient's awareness of abnormal movements (rate only patient's report): No Awareness, Dental Status Current problems with teeth and/or dentures?: No Does patient usually wear dentures?: No  CIWA:    COWS:     Musculoskeletal: Strength & Muscle Tone: within normal limits Gait & Station: normal Patient leans: N/A  Psychiatric Specialty Exam: Review of Systems  All other systems reviewed and are negative.   Blood pressure 128/81, pulse 85, temperature 98.7 F (37.1 C), temperature source Oral, resp. rate 20, height 5\' 10"  (1.778 m), weight 117.028 kg (258 lb), SpO2 95 %.Body  mass index is 37.02 kg/(m^2).  See SRA.                                                  Sleep:  Number of Hours: 6.25   Have you used any form of tobacco in the last 30 days? (Cigarettes, Smokeless Tobacco, Cigars, and/or Pipes): Yes  Has this patient used any form of tobacco in the last 30 days? (Cigarettes, Smokeless Tobacco, Cigars, and/or Pipes) Yes, Yes, A prescription for an FDA-approved tobacco cessation medication was offered at discharge and the patient refused  Blood Alcohol level:  Lab Results  Component Value Date   [facility] <5 10/06/2015   ETH <5 08/27/2015    Metabolic Disorder Labs:  Lab Results  Component Value Date   HGBA1C 12.2* 10/07/2015   MPG 154 02/09/2015   MPG 229 08/16/2007   Lab Results  Component Value Date   PROLACTIN 45.2* 10/07/2015   Lab Results  Component Value Date   CHOL 98 10/07/2015   TRIG 87 10/07/2015   HDL 37* 10/07/2015   CHOLHDL 2.6 10/07/2015   VLDL 17 10/07/2015   LDLCALC 44 10/07/2015  LDLCALC 34 04/16/2015    See Psychiatric Specialty Exam and Suicide Risk Assessment completed by Attending Physician prior to discharge.  Discharge destination:  Home  Is patient on multiple antipsychotic therapies at discharge:  No   Has Patient had three or more failed trials of antipsychotic monotherapy by history:  No  Recommended Plan for Multiple Antipsychotic Therapies: NA  Discharge Instructions    Diet - low sodium heart healthy    Complete by:  As directed      Increase activity slowly    Complete by:  As directed             Medication List    STOP taking these medications        propranolol 20 MG tablet  Commonly known as:  INDERAL     traZODone 100 MG tablet  Commonly known as:  DESYREL     trihexyphenidyl 2 MG tablet  Commonly known as:  ARTANE      TAKE these medications      Indication   amantadine 100 MG capsule  Commonly known as:  SYMMETREL  Take 1 capsule (100 mg total) by mouth  2 (two) times daily.   Indication:  Extrapyramidal Reaction caused by Medications     clonazePAM 1 MG tablet  Commonly known as:  KLONOPIN  Take 1 tablet (1 mg total) by mouth every 8 (eight) hours as needed for anxiety.   Indication:  Manic-Depression     divalproex 250 MG DR tablet  Commonly known as:  DEPAKOTE  Take 3 tablets (750 mg total) by mouth every 12 (twelve) hours.   Indication:  Manic Phase of Manic-Depression     lithium carbonate 450 MG CR tablet  Commonly known as:  ESKALITH  Take 1 tablet (450 mg total) by mouth every 12 (twelve) hours.   Indication:  Mania     metFORMIN 1000 MG tablet  Commonly known as:  GLUCOPHAGE  Take 1 tablet (1,000 mg total) by mouth 2 (two) times daily with a meal.   Indication:  Antipsychotic Therapy-Induced Weight Gain, Type 2 Diabetes     metoprolol succinate 50 MG 24 hr tablet  Commonly known as:  TOPROL-XL  Take 1 tablet (50 mg total) by mouth daily. Take with or immediately following a meal.   Indication:  High Blood Pressure     paliperidone 234 MG/1.5ML Susp injection  Commonly known as:  INVEGA SUSTENNA  Inject 234 mg into the muscle every 21 ( twenty-one) days.  Start taking on:  10/18/2015   Indication:  Schizoaffective Disorder     paliperidone 6 MG 24 hr tablet  Commonly known as:  INVEGA  Take 1 tablet (6 mg total) by mouth daily.   Indication:  Schizoaffective Disorder     primidone 50 MG tablet  Commonly known as:  MYSOLINE  Take 1 tablet (50 mg total) by mouth every 8 (eight) hours.   Indication:  Fine to Coarse Slow Tremor affecting Head, Hands & Voice     simvastatin 10 MG tablet  Commonly known as:  ZOCOR  Take 1 tablet (10 mg total) by mouth at bedtime.   Indication:  Cerebrovascular Accident or Stroke     temazepam 30 MG capsule  Commonly known as:  RESTORIL  Take 1 capsule (30 mg total) by mouth at bedtime.   Indication:  Trouble Sleeping         Follow-up recommendations:  Activity:  As  tolerated Diet:  Low sodium heart healthy  ADA diet Other:  Keep follow-up appointments.  Comments:    Signed: Kristine Linea, MD 10/12/2015, 12:47 PM

## 2015-10-12 NOTE — Progress Notes (Signed)
Patient acknowledges all belongings have been returned. Mother arrives to transport patient via private car. Verbalizes understanding rt recommended plan of care.

## 2015-10-12 NOTE — Progress Notes (Signed)
  [facility] :  Will you be returning to the same living situation after discharge:  Yes,  homem with mother At discharge, do you have transportation home?: Yes,  mother to pick up Do you have the ability to pay for your medications: Yes,  Medicare and Medicaid  Release of information consent forms completed and in the chart;  Patient's signature needed at discharge.  Patient to Follow up at: Follow-up Information    Go to [facility].   Why:  Walk-ins Monday-Friday between 9am-4pm., Please take your photo ID and Insurance card, Hospital Follow up, Outpatient Medication Management, PSR Day program   Contact information:   [address] [place] [address] [phone] Fax      Next level of care provider has access to [facility] Link:no  Safety Planning and Suicide Prevention discussed: Yes,     Have you used any form of tobacco in the last 30 days? (Cigarettes, Smokeless Tobacco, Cigars, and/or Pipes): Yes  Has patient been referred to the Quitline?: Patient refused referral  Patient has been referred for addiction treatment: N/A  Glennon MacLaws, Addie Cederberg P, MSW, LCSW 10/12/2015, 3:05 PM

## 2015-10-12 NOTE — Progress Notes (Signed)
Patient with bright affect, cooperative behavior with meals, meds and plan of care. No SI/HI at this time. Pleasant when writer initiates interaction and silly at times. States his goal today is to be pleasant as possible to staff. Encouraged to attend therapy groups to learn stressors. Fair adls. Good appetite. Social with peers. Safety maintained.

## 2015-10-12 NOTE — Plan of Care (Signed)
Problem: Alteration in thought process Goal: STG-Patient is able to sleep at least 6 hours per night Outcome: Not Progressing Patient slept 5.5 hours previous night

## 2015-10-25 DIAGNOSIS — F319 Bipolar disorder, unspecified: Secondary | ICD-10-CM | POA: Diagnosis not present

## 2015-11-04 DIAGNOSIS — F319 Bipolar disorder, unspecified: Secondary | ICD-10-CM | POA: Diagnosis not present

## 2015-11-04 DIAGNOSIS — F250 Schizoaffective disorder, bipolar type: Secondary | ICD-10-CM | POA: Diagnosis not present

## 2015-11-22 DIAGNOSIS — F319 Bipolar disorder, unspecified: Secondary | ICD-10-CM | POA: Diagnosis not present

## 2015-12-20 DIAGNOSIS — F250 Schizoaffective disorder, bipolar type: Secondary | ICD-10-CM | POA: Diagnosis not present

## 2015-12-20 DIAGNOSIS — F319 Bipolar disorder, unspecified: Secondary | ICD-10-CM | POA: Diagnosis not present

## 2016-02-23 DIAGNOSIS — F250 Schizoaffective disorder, bipolar type: Secondary | ICD-10-CM | POA: Diagnosis not present

## 2016-03-13 DIAGNOSIS — F250 Schizoaffective disorder, bipolar type: Secondary | ICD-10-CM | POA: Diagnosis not present

## 2016-03-27 ENCOUNTER — Encounter: Payer: Self-pay | Admitting: Surgery

## 2016-03-27 ENCOUNTER — Inpatient Hospital Stay
Admission: EM | Admit: 2016-03-27 | Discharge: 2016-04-01 | DRG: 419 | Disposition: A | Discharge status: Home / Self Care | Payer: Medicare Other | Attending: Internal Medicine | Admitting: Internal Medicine

## 2016-03-27 ENCOUNTER — Emergency Department: Payer: Medicare Other

## 2016-03-27 DIAGNOSIS — R4182 Altered mental status, unspecified: Secondary | ICD-10-CM | POA: Diagnosis not present

## 2016-03-27 DIAGNOSIS — E1165 Type 2 diabetes mellitus with hyperglycemia: Secondary | ICD-10-CM | POA: Diagnosis present

## 2016-03-27 DIAGNOSIS — E785 Hyperlipidemia, unspecified: Secondary | ICD-10-CM | POA: Diagnosis present

## 2016-03-27 DIAGNOSIS — F259 Schizoaffective disorder, unspecified: Secondary | ICD-10-CM

## 2016-03-27 DIAGNOSIS — I10 Essential (primary) hypertension: Secondary | ICD-10-CM | POA: Diagnosis not present

## 2016-03-27 DIAGNOSIS — F17290 Nicotine dependence, other tobacco product, uncomplicated: Secondary | ICD-10-CM | POA: Diagnosis present

## 2016-03-27 DIAGNOSIS — Z818 Family history of other mental and behavioral disorders: Secondary | ICD-10-CM | POA: Diagnosis not present

## 2016-03-27 DIAGNOSIS — K810 Acute cholecystitis: Secondary | ICD-10-CM | POA: Diagnosis not present

## 2016-03-27 DIAGNOSIS — F419 Anxiety disorder, unspecified: Secondary | ICD-10-CM | POA: Diagnosis not present

## 2016-03-27 DIAGNOSIS — Z7984 Long term (current) use of oral hypoglycemic drugs: Secondary | ICD-10-CM | POA: Diagnosis not present

## 2016-03-27 DIAGNOSIS — Z79899 Other long term (current) drug therapy: Secondary | ICD-10-CM | POA: Diagnosis not present

## 2016-03-27 DIAGNOSIS — R109 Unspecified abdominal pain: Secondary | ICD-10-CM

## 2016-03-27 DIAGNOSIS — K812 Acute cholecystitis with chronic cholecystitis: Secondary | ICD-10-CM | POA: Diagnosis not present

## 2016-03-27 DIAGNOSIS — K5790 Diverticulosis of intestine, part unspecified, without perforation or abscess without bleeding: Secondary | ICD-10-CM | POA: Diagnosis not present

## 2016-03-27 DIAGNOSIS — E669 Obesity, unspecified: Secondary | ICD-10-CM | POA: Diagnosis present

## 2016-03-27 DIAGNOSIS — F250 Schizoaffective disorder, bipolar type: Secondary | ICD-10-CM | POA: Diagnosis not present

## 2016-03-27 DIAGNOSIS — F17210 Nicotine dependence, cigarettes, uncomplicated: Secondary | ICD-10-CM | POA: Diagnosis present

## 2016-03-27 DIAGNOSIS — Z888 Allergy status to other drugs, medicaments and biological substances status: Secondary | ICD-10-CM

## 2016-03-27 DIAGNOSIS — E119 Type 2 diabetes mellitus without complications: Secondary | ICD-10-CM | POA: Diagnosis not present

## 2016-03-27 DIAGNOSIS — I96 Gangrene, not elsewhere classified: Secondary | ICD-10-CM | POA: Diagnosis not present

## 2016-03-27 DIAGNOSIS — F3181 Bipolar II disorder: Secondary | ICD-10-CM

## 2016-03-27 DIAGNOSIS — F319 Bipolar disorder, unspecified: Secondary | ICD-10-CM | POA: Diagnosis not present

## 2016-03-27 DIAGNOSIS — Z6836 Body mass index (BMI) 36.0-36.9, adult: Secondary | ICD-10-CM | POA: Diagnosis not present

## 2016-03-27 DIAGNOSIS — K828 Other specified diseases of gallbladder: Secondary | ICD-10-CM | POA: Diagnosis not present

## 2016-03-27 DIAGNOSIS — K8000 Calculus of gallbladder with acute cholecystitis without obstruction: Secondary | ICD-10-CM | POA: Diagnosis not present

## 2016-03-27 HISTORY — DX: Acute cholecystitis with chronic cholecystitis: K81.2

## 2016-03-27 LAB — CBC
HCT: 44.6 % (ref 40.0–52.0)
Hemoglobin: 15.1 g/dL (ref 13.0–18.0)
MCH: 28.7 pg (ref 26.0–34.0)
MCHC: 33.9 g/dL (ref 32.0–36.0)
MCV: 84.7 fL (ref 80.0–100.0)
Platelets: 237 10*3/uL (ref 150–440)
RBC: 5.27 MIL/uL (ref 4.40–5.90)
RDW: 13.5 % (ref 11.5–14.5)
WBC: 18 10*3/uL — ABNORMAL HIGH (ref 3.8–10.6)

## 2016-03-27 LAB — BASIC METABOLIC PANEL
Anion gap: 7 (ref 5–15)
BUN: 15 mg/dL (ref 6–20)
CO2: 24 mmol/L (ref 22–32)
Calcium: 9.5 mg/dL (ref 8.9–10.3)
Chloride: 98 mmol/L — ABNORMAL LOW (ref 101–111)
Creatinine, Ser: 0.97 mg/dL (ref 0.61–1.24)
GFR calc Af Amer: >60 mL/min (ref >60)
GFR calc non Af Amer: >60 mL/min (ref >60)
Glucose, Bld: 303 mg/dL — ABNORMAL HIGH (ref 65–99)
Potassium: 4.1 mmol/L (ref 3.5–5.1)
Sodium: 129 mmol/L — ABNORMAL LOW (ref 135–145)

## 2016-03-27 LAB — HEPATIC FUNCTION PANEL
ALT: 11 U/L — ABNORMAL LOW (ref 17–63)
AST: 15 U/L (ref 15–41)
Albumin: 3.7 g/dL (ref 3.5–5.0)
Alkaline Phosphatase: 61 U/L (ref 38–126)
Bilirubin, Direct: <0.1 mg/dL — ABNORMAL LOW (ref 0.1–0.5)
Total Bilirubin: 0.7 mg/dL (ref 0.3–1.2)
Total Protein: 7.8 g/dL (ref 6.5–8.1)

## 2016-03-27 LAB — URINALYSIS COMPLETE WITH MICROSCOPIC (ARMC ONLY)
Bacteria, UA: NONE SEEN
Bilirubin Urine: NEGATIVE
Glucose, UA: >500 mg/dL — AB
Hgb urine dipstick: NEGATIVE
Leukocytes, UA: NEGATIVE
Nitrite: NEGATIVE
Protein, ur: 100 mg/dL — AB
Specific Gravity, Urine: 1.036 — ABNORMAL HIGH (ref 1.005–1.030)
Squamous Epithelial / LPF: NONE SEEN
pH: 5 (ref 5.0–8.0)

## 2016-03-27 LAB — GLUCOSE, CAPILLARY: Glucose-Capillary: 228 mg/dL — ABNORMAL HIGH (ref 65–99)

## 2016-03-27 LAB — LACTIC ACID, PLASMA: Lactic Acid, Venous: 1 mmol/L (ref 0.5–1.9)

## 2016-03-27 LAB — LIPASE, BLOOD: Lipase: 19 U/L (ref 11–51)

## 2016-03-27 LAB — VALPROIC ACID LEVEL: Valproic Acid Lvl: 67 ug/mL (ref 50.0–100.0)

## 2016-03-27 MED ORDER — NICOTINE 21 MG/24HR TD PT24
21.0000 mg | MEDICATED_PATCH | Freq: Once | TRANSDERMAL | Status: AC
  Administered 2016-03-27: 21 mg via TRANSDERMAL
  Filled 2016-03-27: qty 1

## 2016-03-27 MED ORDER — ACETAMINOPHEN 500 MG PO TABS
1000.0000 mg | ORAL_TABLET | Freq: Four times a day (QID) | ORAL | Status: DC
  Administered 2016-03-28 – 2016-03-31 (×14): 1000 mg via ORAL
  Filled 2016-03-27 (×15): qty 2
  Filled 2016-03-27: qty 1
  Filled 2016-03-27 (×2): qty 2

## 2016-03-27 MED ORDER — KETOROLAC TROMETHAMINE 30 MG/ML IJ SOLN
15.0000 mg | Freq: Once | INTRAMUSCULAR | Status: AC
  Administered 2016-03-27: 15 mg via INTRAVENOUS
  Filled 2016-03-27: qty 1

## 2016-03-27 MED ORDER — MORPHINE SULFATE (PF) 4 MG/ML IV SOLN: 4.0000 mg | Freq: Once | INTRAVENOUS | Status: DC

## 2016-03-27 MED ORDER — ONDANSETRON HCL 4 MG/2ML IJ SOLN: 4.0000 mg | Freq: Once | INTRAMUSCULAR | Status: DC

## 2016-03-27 MED ORDER — OXYCODONE HCL 5 MG PO TABS
5.0000 mg | ORAL_TABLET | ORAL | Status: DC | PRN
  Administered 2016-03-27: 5 mg via ORAL
  Administered 2016-03-30: 10 mg via ORAL
  Filled 2016-03-27: qty 2
  Filled 2016-03-27: qty 1

## 2016-03-27 MED ORDER — INSULIN ASPART 100 UNIT/ML ~~LOC~~ SOLN
0.0000 [IU] | SUBCUTANEOUS | Status: DC
  Administered 2016-03-27: 5 [IU] via SUBCUTANEOUS
  Administered 2016-03-28: 3 [IU] via SUBCUTANEOUS
  Administered 2016-03-28: 5 [IU] via SUBCUTANEOUS
  Administered 2016-03-28: 2 [IU] via SUBCUTANEOUS
  Administered 2016-03-28 (×2): 5 [IU] via SUBCUTANEOUS
  Administered 2016-03-28 – 2016-03-29 (×6): 3 [IU] via SUBCUTANEOUS
  Administered 2016-03-29: 2 [IU] via SUBCUTANEOUS
  Administered 2016-03-30: 8 [IU] via SUBCUTANEOUS
  Administered 2016-03-31 (×3): 3 [IU] via SUBCUTANEOUS
  Administered 2016-03-31: 5 [IU] via SUBCUTANEOUS
  Administered 2016-03-31 – 2016-04-01 (×2): 3 [IU] via SUBCUTANEOUS
  Administered 2016-04-01: 2 [IU] via SUBCUTANEOUS
  Administered 2016-04-01: 3 [IU] via SUBCUTANEOUS
  Filled 2016-03-27: qty 3
  Filled 2016-03-27: qty 5
  Filled 2016-03-27 (×6): qty 3
  Filled 2016-03-27 (×2): qty 5
  Filled 2016-03-27: qty 8
  Filled 2016-03-27: qty 2
  Filled 2016-03-27: qty 5
  Filled 2016-03-27 (×5): qty 3
  Filled 2016-03-27 (×2): qty 2
  Filled 2016-03-27: qty 3

## 2016-03-27 MED ORDER — MORPHINE SULFATE (PF) 4 MG/ML IV SOLN
4.0000 mg | Freq: Once | INTRAVENOUS | Status: AC
  Administered 2016-03-27: 4 mg via INTRAVENOUS
  Filled 2016-03-27: qty 1

## 2016-03-27 MED ORDER — METOPROLOL TARTRATE 5 MG/5ML IV SOLN
5.0000 mg | Freq: Three times a day (TID) | INTRAVENOUS | Status: DC
  Administered 2016-03-27 – 2016-03-29 (×6): 5 mg via INTRAVENOUS
  Filled 2016-03-27 (×6): qty 5

## 2016-03-27 MED ORDER — LABETALOL HCL 5 MG/ML IV SOLN
10.0000 mg | INTRAVENOUS | Status: DC | PRN
  Administered 2016-03-27: 10 mg via INTRAVENOUS

## 2016-03-27 MED ORDER — SODIUM CHLORIDE 0.9 % IV SOLN
INTRAVENOUS | Status: DC
  Administered 2016-03-27 – 2016-03-30 (×6): via INTRAVENOUS

## 2016-03-27 MED ORDER — ONDANSETRON HCL 4 MG/2ML IJ SOLN
4.0000 mg | Freq: Once | INTRAMUSCULAR | Status: AC
  Administered 2016-03-27: 4 mg via INTRAVENOUS
  Filled 2016-03-27: qty 2

## 2016-03-27 MED ORDER — KETOROLAC TROMETHAMINE 30 MG/ML IJ SOLN
INTRAMUSCULAR | Status: AC
  Filled 2016-03-27: qty 1

## 2016-03-27 MED ORDER — SODIUM CHLORIDE 0.9 % IV BOLUS (SEPSIS)
1000.0000 mL | Freq: Once | INTRAVENOUS | Status: AC
  Administered 2016-03-27: 1000 mL via INTRAVENOUS

## 2016-03-27 MED ORDER — LORAZEPAM 2 MG/ML IJ SOLN: 0.5000 mg | Freq: Four times a day (QID) | INTRAMUSCULAR | Status: DC | PRN

## 2016-03-27 MED ORDER — PIPERACILLIN-TAZOBACTAM 3.375 G IVPB 30 MIN
3.3750 g | Freq: Once | INTRAVENOUS | Status: AC
  Administered 2016-03-27: 3.375 g via INTRAVENOUS
  Filled 2016-03-27: qty 50

## 2016-03-27 MED ORDER — HYDRALAZINE HCL 20 MG/ML IJ SOLN
10.0000 mg | Freq: Four times a day (QID) | INTRAMUSCULAR | Status: DC | PRN
  Administered 2016-03-31: 10 mg via INTRAVENOUS
  Filled 2016-03-27: qty 1

## 2016-03-27 MED ORDER — NICOTINE 21 MG/24HR TD PT24
21.0000 mg | MEDICATED_PATCH | Freq: Every day | TRANSDERMAL | Status: DC
  Administered 2016-03-28 – 2016-04-01 (×5): 21 mg via TRANSDERMAL
  Filled 2016-03-27 (×5): qty 1

## 2016-03-27 MED ORDER — VALPROATE SODIUM 500 MG/5ML IV SOLN
500.0000 mg | Freq: Two times a day (BID) | INTRAVENOUS | Status: DC
  Administered 2016-03-28 – 2016-04-01 (×10): 500 mg via INTRAVENOUS
  Filled 2016-03-27 (×12): qty 5

## 2016-03-27 MED ORDER — ONDANSETRON HCL 4 MG/2ML IJ SOLN: 4.0000 mg | Freq: Four times a day (QID) | INTRAMUSCULAR | Status: DC | PRN

## 2016-03-27 MED ORDER — KETOROLAC TROMETHAMINE 30 MG/ML IJ SOLN
30.0000 mg | Freq: Four times a day (QID) | INTRAMUSCULAR | Status: DC
  Administered 2016-03-28 – 2016-04-01 (×17): 30 mg via INTRAVENOUS
  Filled 2016-03-27 (×17): qty 1

## 2016-03-27 MED ORDER — INSULIN ASPART 100 UNIT/ML ~~LOC~~ SOLN
SUBCUTANEOUS | Status: AC
  Filled 2016-03-27: qty 5

## 2016-03-27 MED ORDER — LABETALOL HCL 5 MG/ML IV SOLN
INTRAVENOUS | Status: AC
  Filled 2016-03-27: qty 4

## 2016-03-27 MED ORDER — ZIPRASIDONE MESYLATE 20 MG IM SOLR
10.0000 mg | Freq: Four times a day (QID) | INTRAMUSCULAR | Status: DC | PRN
  Filled 2016-03-27: qty 20

## 2016-03-27 MED ORDER — METOPROLOL TARTRATE 5 MG/5ML IV SOLN
INTRAVENOUS | Status: AC
  Filled 2016-03-27: qty 5

## 2016-03-27 MED ORDER — LORAZEPAM 2 MG/ML IJ SOLN
INTRAMUSCULAR | Status: AC
  Administered 2016-03-27: 2 mg via INTRAMUSCULAR
  Filled 2016-03-27: qty 1

## 2016-03-27 MED ORDER — ONDANSETRON 4 MG PO TBDP: 4.0000 mg | ORAL_TABLET | Freq: Four times a day (QID) | ORAL | Status: DC | PRN

## 2016-03-27 MED ORDER — LORAZEPAM 2 MG/ML IJ SOLN
2.0000 mg | Freq: Once | INTRAMUSCULAR | Status: AC
  Administered 2016-03-27: 2 mg via INTRAMUSCULAR

## 2016-03-27 MED ORDER — LITHIUM CARBONATE ER 450 MG PO TBCR
450.0000 mg | EXTENDED_RELEASE_TABLET | Freq: Two times a day (BID) | ORAL | Status: DC
  Administered 2016-03-27 – 2016-04-01 (×9): 450 mg via ORAL
  Filled 2016-03-27 (×13): qty 1

## 2016-03-27 MED ORDER — PIPERACILLIN-TAZOBACTAM 3.375 G IVPB
3.3750 g | Freq: Three times a day (TID) | INTRAVENOUS | Status: DC
  Administered 2016-03-28 – 2016-04-01 (×14): 3.375 g via INTRAVENOUS
  Filled 2016-03-27 (×14): qty 50

## 2016-03-27 NOTE — ED Provider Notes (Signed)
7:30p.  Pt now wanting to leave AMA.  I cannot assess his competence as he will not cooperate with talking to me.  He states that he is very angry that he has been "subjected to all of these bells and ringing".  I offered him Ativan to help with his nerves and offered that we could help adjust the monitor so they would not be ringing but patient states no new ones to go home and that God will heal him. Until I cannot assess his ability to make decisions for himself I feel I need to place him on involuntary commitment. His heart rate has been increasing throughout the day and is now in the 130s.  Surgery aware; Dr. Orvis BrillLoflin in the ER & will see pt.  I will order a telepsych consult.  7:50p Dr. Orvis BrillLoflin has convinced pt to stay for admission.   Maurilio LovelyNoelle Donie Lemelin, MD 03/27/16 2121

## 2016-03-27 NOTE — ED Notes (Signed)
Pt remains alert and oriented. Waiting on surgery consult. NAD. Skin warm dry and pink. respirations unlabored.

## 2016-03-27 NOTE — H&P (Signed)
Greg [name] is an 50 y.o. male.   Chief Complaint: abdominal pain HPI: 50 yr old male with PMHx of HTN and Biploar disorder came in with pain starting last night after eating some almonds.  Patient states that he had the pan in the right upper abdomen and around his back and flank.  He states the pain was 5 out of ten and constant.  Patient has had pain medication here and states that it does not hurt nearly as bad now.  He has not had any fevers or chills.  He has had some nausea but no vomiting.  He has been constipated for the past few days and took some Senna yesterday.  He did have a BM here that was hard and difficult to pass.  He also denies any dysuria.  He did have some members in his family with gallbladder disease.     Past Medical History:  Diagnosis Date  . Bipolar 1 disorder ([place])   . Diabetes ([place])   . Dyslipidemia   . HTN (hypertension)   . Obesity   . Paranoia ([place])   . Schizo affective schizophrenia [facility])     Past Surgical History:  Procedure Laterality Date  . APPENDECTOMY    . TONSILLECTOMY      Family History  Problem Relation Age of Onset  . Depression Mother   . Mental illness Mother   . Depression Father   . Mental illness Father   . Depression Sister   . Mental illness Sister    Social History:  reports that he has been smoking Cigarettes.  He has a 8.00 pack-year smoking history. His smokeless tobacco use includes Snuff. He reports that he does not drink alcohol or use drugs.  Allergies:  Allergies  Allergen Reactions  . Haldol [Haloperidol] Anaphylaxis  . Risperdal [Risperidone] Other (See Comments)    Reaction: unknown   . Seroquel [Quetiapine Fumarate] Other (See Comments)    Reaction: unknown      (Not in a hospital admission)  Results for orders placed or performed during the hospital encounter of 03/27/16 (from the past 48 hour(s))  Urinalysis complete, with microscopic ([facility] only)     Status: Abnormal   Collection Time: 03/27/16   3:34 AM  Result Value Ref Range   Color, Urine YELLOW (A) YELLOW   APPearance CLEAR (A) CLEAR   Glucose, UA >500 (A) NEGATIVE mg/dL   Bilirubin Urine NEGATIVE NEGATIVE   Ketones, ur 2+ (A) NEGATIVE mg/dL   Specific Gravity, Urine 1.036 (H) 1.005 - 1.030   Hgb urine dipstick NEGATIVE NEGATIVE   pH 5.0 5.0 - 8.0   Protein, ur 100 (A) NEGATIVE mg/dL   Nitrite NEGATIVE NEGATIVE   Leukocytes, UA NEGATIVE NEGATIVE   RBC / HPF 0-5 0 - 5 RBC/hpf   WBC, UA 0-5 0 - 5 WBC/hpf   Bacteria, UA NONE SEEN NONE SEEN   Squamous Epithelial / LPF NONE SEEN NONE SEEN   Mucous PRESENT   CBC     Status: Abnormal   Collection Time: 03/27/16  3:34 AM  Result Value Ref Range   WBC 18.0 (H) 3.8 - 10.6 K/uL   RBC 5.27 4.40 - 5.90 MIL/uL   Hemoglobin 15.1 13.0 - 18.0 g/dL   HCT 44.6 40.0 - 52.0 %   MCV 84.7 80.0 - 100.0 fL   MCH 28.7 26.0 - 34.0 pg   MCHC 33.9 32.0 - 36.0 g/dL   RDW 13.5 11.5 - 14.5 %   Platelets  237 150 - 440 K/uL  Basic metabolic panel     Status: Abnormal   Collection Time: 03/27/16  3:34 AM  Result Value Ref Range   Sodium 129 (L) 135 - 145 mmol/L   Potassium 4.1 3.5 - 5.1 mmol/L   Chloride 98 (L) 101 - 111 mmol/L   CO2 24 22 - 32 mmol/L   Glucose, Bld 303 (H) 65 - 99 mg/dL   BUN 15 6 - 20 mg/dL   Creatinine, Ser 0.97 0.61 - 1.24 mg/dL   Calcium 9.5 8.9 - 10.3 mg/dL   GFR calc non Af Amer >60 >60 mL/min   GFR calc Af Amer >60 >60 mL/min    Comment: (NOTE) The eGFR has been calculated using the CKD EPI equation. This calculation has not been validated in all clinical situations. eGFR's persistently <60 mL/min signify possible Chronic Kidney Disease.    Anion gap 7 5 - 15  Hepatic function panel     Status: Abnormal   Collection Time: 03/27/16  3:34 AM  Result Value Ref Range   Total Protein 7.8 6.5 - 8.1 g/dL   Albumin 3.7 3.5 - 5.0 g/dL   AST 15 15 - 41 U/L   ALT 11 (L) 17 - 63 U/L   Alkaline Phosphatase 61 38 - 126 U/L   Total Bilirubin 0.7 0.3 - 1.2 mg/dL    Bilirubin, Direct <0.1 (L) 0.1 - 0.5 mg/dL   Indirect Bilirubin NOT CALCULATED 0.3 - 0.9 mg/dL  Lipase, blood     Status: None   Collection Time: 03/27/16  3:34 AM  Result Value Ref Range   Lipase 19 11 - 51 U/L  Valproic acid level     Status: None   Collection Time: 03/27/16  3:34 AM  Result Value Ref Range   Valproic Acid Lvl 67 50.0 - 100.0 ug/mL  Lactic acid, plasma     Status: None   Collection Time: 03/27/16  4:33 PM  Result Value Ref Range   Lactic Acid, Venous 1.0 0.5 - 1.9 mmol/L   Ct Renal Stone Study  Result Date: 03/27/2016 CLINICAL DATA:  Right flank pain EXAM: CT ABDOMEN AND PELVIS WITHOUT CONTRAST TECHNIQUE: Multidetector CT imaging of the abdomen and pelvis was performed following the standard protocol without IV contrast. COMPARISON:  CT abdomen pelvis 08/04/2012 FINDINGS: Lower chest:  Lung bases clear Hepatobiliary: Large calcified gallstone in the neck of the gallbladder measuring 2.6 cm. There is mild gallbladder wall thickening with edema in the fat surrounding the gallbladder. Findings suggest acute cholecystitis. Bile ducts nondilated. Liver is normal. Pancreas: Negative Spleen: Negative Adrenals/Urinary Tract: Negative for urinary tract calculi. No renal obstruction or mass. Mild bladder wall thickening. Stomach/Bowel: Stomach and duodenum normal. Negative for bowel obstruction. Mild colonic diverticulosis with diverticulum the left colon and sigmoid colon. Negative for diverticulitis. Appendectomy clips. Vascular/Lymphatic: Aorta and IVC within normal limits. Negative for lymphadenopathy Reproductive: Mild to moderate prostate enlargement with mild bladder wall thickening. Other: No free fluid.  Negative for hernia Musculoskeletal: Negative IMPRESSION: 2.6 cm calcified gallstone in the neck of the gallbladder. Gallbladder wall thickening and surrounding edema suggests acute cholecystitis. Mild colonic diverticulosis without diverticulitis Electronically Signed   By:  Franchot Gallo M.D.   On: 03/27/2016 08:18    Review of Systems  Constitutional: Negative for chills, diaphoresis and fever.  HENT: Negative for ear pain and sore throat.   Respiratory: Negative for cough, sputum production, shortness of breath and wheezing.   Cardiovascular: Negative for chest  pain, palpitations, orthopnea, claudication and leg swelling.  Gastrointestinal: Positive for abdominal pain and nausea. Negative for blood in stool, constipation, diarrhea, heartburn and vomiting.  Genitourinary: Positive for flank pain. Negative for dysuria, frequency, hematuria and urgency.  Musculoskeletal: Negative for back pain, myalgias and neck pain.  Skin: Negative for itching and rash.  Neurological: Negative for dizziness, tingling, tremors, sensory change, speech change and focal weakness.  Psychiatric/Behavioral: Negative for depression, hallucinations, substance abuse and suicidal ideas. The patient is nervous/anxious.   All other systems reviewed and are negative.   Blood pressure (!) 150/91, pulse (!) 108, temperature 98.3 F (36.8 C), temperature source Oral, resp. rate (!) 26, height _0  (1.778 m), weight 250 lb (113.4 kg), SpO2 96 %. Physical Exam  Vitals reviewed. Constitutional: He is oriented to person, place, and time. He appears well-developed and well-nourished. No distress.  HENT:  Head: Normocephalic and atraumatic.  Right Ear: External ear normal.  Left Ear: External ear normal.  Nose: Nose normal.  Mouth/Throat: Oropharynx is clear and moist. No oropharyngeal exudate.  Eyes: Conjunctivae and EOM are normal. Pupils are equal, round, and reactive to light. No scleral icterus.  Neck: Neck supple. No tracheal deviation present.  Cardiovascular: Normal rate, regular rhythm, normal heart sounds and intact distal pulses.  Exam reveals no gallop and no friction rub.   No murmur heard. Respiratory: Effort normal and breath sounds normal. No respiratory distress. He has no  wheezes. He has no rales.  GI: Soft. Bowel sounds are normal. He exhibits no distension. There is tenderness. There is no rebound and no guarding.  Mild RUQ/ right flank tenderness; no epigastric tenderness, some Right CVA tenderness  Musculoskeletal: Normal range of motion. He exhibits no edema, tenderness or deformity.  Neurological: He is alert and oriented to person, place, and time. No cranial nerve deficit.  Skin: Skin is warm and dry. No rash noted. No erythema. No pallor.  Psychiatric: He has a normal mood and affect.  Patient anxious but cooperative to my exam however not with other staff     Assessment/Plan 50 yr old male with HTN, Bipolar and acute cholecytsitits.  I have personally reviewed his past medical history to include some psych diagnoses of schizoid and paranoia in the past. I have personally reveiwed his lab values which show an elevated wbc at 18.  I have personally reviewed his CT scan images which do show a very thickened gallbladder wall with a large 2.5 cm stone.    I have reviewed these findings with the patient and he is very anxious about having any surgeries, although he said he had a good experience with his Lap appy a few years back.  I explained to the patient that we could try him on some antibiotics and see that resolved his pain for now.  But I did inform him of his large stone which gives him a higher likelihood of having issues in the future.     Of note, while the patient was waiting in the ED, he became increasingly agitated with increasing HR, BP and anxiety.  The patient tried to leave AMA and ripped his IV out.  I spoke with the patient again about the plan to admit and give antibiotics.  He is in agreement at this time.  I have had medicine consulted for help with HTN and anxiety and due to the IVC in the ED psych has been consulted as well.    Hubbard Robinson, MD 03/27/2016, 7:59 PM

## 2016-03-27 NOTE — ED Notes (Signed)
Pt. A/O, waiting on disposition. Respirations unlabored. Skin warm, dry, pink.

## 2016-03-27 NOTE — ED Notes (Signed)
Pt's call light going off. This RN went into the room and pt stated, "I'm ready to be discharged right now. I'm not hurting at all and I'm bipolar and all the dinging noises are driving me crazy and I can't stand it anymore. I want to leave right now." This RN verified with pt that he wasn't hurting again and pt stated, "Yes, I'm not hurting. I want to leave now." Pt's HR in the 130s. This RN went to talk to ER MD about pt wanting to leave. Pt has admission orders from prime doc and surgery but no order for admission bed. ER MD to bedside to talk with pt due to fact that pt's vitals unstable while this RN calls surgeon on call to explain situation. ER MD came out of room saying that pt is wanting to leave AMA and ER MD signing emergency IVC papers. 2mg  of IV Ativan ordered, however had to be given IM as pt had removed own IV. Pt willingly took IM Ativan. Surgeon on call asking to speak with ER MD as he does not know the patient. Several minutes later the surgeon who saw the patient initially came down to the department to speak with the ER MD regarding the situation. As this RN was in the room talking with patient and explaining that he was under IVC and would not be allowed to leave, Dr. Orvis BrillLoflin came in to speak with pt. Pt unwilling to talk with Dr. Orvis BrillLoflin but willing to speak with this RN. Told this RN, "I'm just worried about having surgery. I've had surgery before and I'm just afraid of the loss of control." This RN reiterated to pt what Dr. Orvis BrillLoflin had told him, that for now we were admitting him for IV fluids and IV antibiotics, and weren't going to be doing surgery right now. Pt verbalized understanding. This RN emphasized to pt that if he did have to go to surgery, that he would be well cared for and looked after during surgery by the OR team and after surgery. Pt verbalized understanding and said that he felt better. Pt agreeable to this RN hooking him back up to the monitor and inserting another IV for  fluids and antibiotics.

## 2016-03-27 NOTE — ED Notes (Signed)
Pt ambulatory to toilet with no assistance. Pt denied weakness or dizziness at this time.

## 2016-03-27 NOTE — ED Provider Notes (Signed)
[facility] Emergency Department Provider Note   ____________________________________________   First MD Initiated Contact with Patient 03/27/16 [phone]     (approximate)  I have reviewed the triage vital signs and the nursing notes.   HISTORY  Chief Complaint Flank Pain    HPI Cordaryl Decelles is a 50 y.o. male history of hypertension, bipolar disorder, diabetes who presents for evaluation of 3 days of waxing and waning right flank pain, gradual onset, constant, no modifying factors, currently mild. He denies any vomiting, diarrhea, fevers or chills, no dysuria or hematuria. Pain has improved somewhat after a Tylenol that he took. He denies any history of similar symptoms, reports that 3 days ago he bent over and picked up a cigarette and thinks that that could've been what caused his pain. He denies any saddle anesthesia, no numbness or weakness in the legs, no incontinence of bowel or bladder, no history of IV drug use, no history of malignancy.   Past Medical History:  Diagnosis Date  . Bipolar 1 disorder (HCC)   . Diabetes (HCC)   . Dyslipidemia   . HTN (hypertension)   . Obesity   . Paranoia (HCC)   . Schizo affective schizophrenia [facility])     Patient Active Problem List   Diagnosis Date Noted  . Benign familial tremor 10/12/2015  . Affective psychosis, bipolar (HCC)   . Hyponatremia 10/06/2015  . Bipolar affective disorder, currently manic, severe, with psychotic features (HCC) 11/28/2014  . HTN (hypertension) 11/28/2014  . Dyslipidemia 11/28/2014  . Obesity 11/28/2014  . Diabetes (HCC) 11/28/2014  . Tobacco use disorder 11/28/2014    Past Surgical History:  Procedure Laterality Date  . APPENDECTOMY    . TONSILLECTOMY      Prior to Admission medications   Medication Sig Start Date End Date Taking? Authorizing Provider  divalproex (DEPAKOTE) 250 MG DR tablet Take 3 tablets (750 mg total) by mouth every 12 (twelve) hours. 10/12/15  Yes  [name], MD  lithium carbonate (ESKALITH) 450 MG CR tablet Take 1 tablet (450 mg total) by mouth every 12 (twelve) hours. 10/12/15  Yes [name], MD  amantadine (SYMMETREL) 100 MG capsule Take 1 capsule (100 mg total) by mouth 2 (two) times daily. Patient not taking: Reported on 03/27/2016 10/12/15   Shari Prows, MD  clonazePAM (KLONOPIN) 1 MG tablet Take 1 tablet (1 mg total) by mouth every 8 (eight) hours as needed for anxiety. 10/12/15   Shari Prows, MD  metFORMIN (GLUCOPHAGE) 1000 MG tablet Take 1 tablet (1,000 mg total) by mouth 2 (two) times daily with a meal. Patient not taking: Reported on 03/27/2016 10/12/15   Shari Prows, MD  metoprolol succinate (TOPROL-XL) 50 MG 24 hr tablet Take 1 tablet (50 mg total) by mouth daily. Take with or immediately following a meal. Patient not taking: Reported on 03/27/2016 10/12/15   Shari Prows, MD  paliperidone (INVEGA SUSTENNA) 234 MG/1.5ML SUSP injection Inject 234 mg into the muscle every 21 ( twenty-one) days. Patient not taking: Reported on 03/27/2016 10/18/15   Shari Prows, MD  paliperidone (INVEGA) 6 MG 24 hr tablet Take 1 tablet (6 mg total) by mouth daily. Patient not taking: Reported on 03/27/2016 10/12/15   Shari Prows, MD  primidone (MYSOLINE) 50 MG tablet Take 1 tablet (50 mg total) by mouth every 8 (eight) hours. Patient not taking: Reported on 03/27/2016 10/12/15   Shari Prows, MD  simvastatin (ZOCOR) 10 MG tablet  Take 1 tablet (10 mg total) by mouth at bedtime. Patient not taking: Reported on 03/27/2016 10/12/15   Shari ProwsJolanta B Pucilowska, MD  temazepam (RESTORIL) 30 MG capsule Take 1 capsule (30 mg total) by mouth at bedtime. Patient not taking: Reported on 03/27/2016 10/12/15   Shari ProwsJolanta B Pucilowska, MD    Allergies Haldol [haloperidol]; Risperdal [risperidone]; and Seroquel [quetiapine fumarate]  No family history on file.  Social History Social History    Substance Use Topics  . Smoking status: Current Every Day Smoker    Packs/day: 1.00    Types: Cigarettes  . Smokeless tobacco: Current User    Types: Snuff  . Alcohol use No    Review of Systems Constitutional: No fever/chills Eyes: No visual changes. ENT: No sore throat. Cardiovascular: Denies chest pain. Respiratory: Denies shortness of breath. Gastrointestinal: No abdominal pain.  No nausea, no vomiting.  No diarrhea.  No constipation. Genitourinary: Negative for dysuria. Musculoskeletal: Positive for back pain. Skin: Negative for rash. Neurological: Negative for headaches, focal weakness or numbness.  10-point ROS otherwise negative.  ____________________________________________   PHYSICAL EXAM:  VITAL SIGNS: ED Triage Vitals  Enc Vitals Group     BP 03/27/16 0333 (!) 148/85     Pulse Rate 03/27/16 0333 100     Resp 03/27/16 0333 18     Temp 03/27/16 0333 97.6 F (36.4 C)     Temp Source 03/27/16 0333 Oral     SpO2 03/27/16 0333 96 %     Weight 03/27/16 0330 250 lb (113.4 kg)     Height 03/27/16 0330 5\' 10"  (1.778 m)     Head Circumference --      Peak Flow --      Pain Score 03/27/16 0330 3     Pain Loc --      Pain Edu? --      Excl. in GC? --     Constitutional: Alert and oriented. Well appearing and in no acute distress. Eyes: Conjunctivae are normal. PERRL. EOMI. Head: Atraumatic. Nose: No congestion/rhinnorhea. Mouth/Throat: Mucous membranes are moist.  Oropharynx non-erythematous. Neck: No stridor. Supple without meningismus.  Cardiovascular: Normal rate, regular rhythm. Grossly normal heart sounds.  Good peripheral circulation. Respiratory: Normal respiratory effort.  No retractions. Lungs CTAB. Gastrointestinal: Soft and nontender. No distention. No abdominal bruits. Mild right CVA tenderness. Genitourinary: deferred Musculoskeletal: No lower extremity tenderness nor edema.  No joint effusions. No midline T or L-spine tenderness to  palpation. Neurologic:  Normal speech and language. No gross focal neurologic deficits are appreciated. No gait instability. Normal sensation in the saddle distribution. 5 out of 5 strength of dorsiflexion of the big toes bilaterally. Skin:  Skin is warm, dry and intact. No rash noted. Psychiatric: Mood and affect are normal. Speech and behavior are normal.  ____________________________________________   LABS (all labs ordered are listed, but only abnormal results are displayed)  Labs Reviewed  URINALYSIS COMPLETEWITH MICROSCOPIC ([facility] ONLY) - Abnormal; Notable for the following:       Result Value   Color, Urine YELLOW (*)    APPearance CLEAR (*)    Glucose, UA >500 (*)    Ketones, ur 2+ (*)    Specific Gravity, Urine 1.036 (*)    Protein, ur 100 (*)    All other components within normal limits  CBC - Abnormal; Notable for the following:    WBC 18.0 (*)    All other components within normal limits  BASIC METABOLIC PANEL - Abnormal; Notable for the following:  Sodium 129 (*)    Chloride 98 (*)    Glucose, Bld 303 (*)    All other components within normal limits  HEPATIC FUNCTION PANEL - Abnormal; Notable for the following:    ALT 11 (*)    Bilirubin, Direct <0.1 (*)    All other components within normal limits  URINE CULTURE  LIPASE, BLOOD  VALPROIC ACID LEVEL   ____________________________________________  EKG  none ____________________________________________  RADIOLOGY  CT abdomen and pelvis ____________________________________________   PROCEDURES  Procedure(s) performed: None  Procedures  Critical Care performed: No  ____________________________________________   INITIAL IMPRESSION / ASSESSMENT AND PLAN / ED COURSE  Pertinent labs & imaging results that were available during my care of the patient were reviewed by me and considered in my medical decision making (see chart for details).  Paulo Keimig is a 50 y.o. male history of  hypertension, bipolar disorder, diabetes who presents for evaluation of 3 days of waxing and waning right flank pain. On exam, he is generally well-appearing and in no acute distress. Vital signs are stable, he is afebrile. He does have mild right-sided CVA tenderness. He does have significant leukocytosis, white blood cell count is elevated at 18,000. EMP shows hyponatremia which is mild, sodium 129 however patient has chronic hyponatremia and this is not far from his baseline. Glucose was elevated at 303, he does have a history of diabetes. Urinalysis is not consistent with urinary tract infection lipase and LFTs are pending however given his symptoms, leukocytosis and mild right CVA tenderness, we'll obtain CT renal protocol, symptomatically with IV fluids and Toradol and reassess for disposition.  ----------------------------------------- 9:36 AM on 03/27/2016 -----------------------------------------  CT scan shows large nonmobile stone in the gallbladder neck as well as findings concerning for acute cholecystitis I discussed this with Dr. Manuela Schwartz nurse and she will evaluate the patient when she is out of the operating room.  ----------------------------------------- 3:46 PM on 03/27/2016 ----------------------------------------- Patient continues to wait for surgical evaluation, he is comfortable. I spoke with Dr. Orvis Brill herself and she reports that she is currently occupied but will be by to evaluate the patient.  Clinical Course     ____________________________________________   FINAL CLINICAL IMPRESSION(S) / ED DIAGNOSES  Final diagnoses:  Right flank pain  Acute cholecystitis      NEW MEDICATIONS STARTED DURING THIS VISIT:  New Prescriptions   No medications on file     Note:  This document was prepared using Dragon voice recognition software and may include unintentional dictation errors.    Gayla Doss, MD 03/27/16 [phone]

## 2016-03-27 NOTE — ED Notes (Signed)
SOC computer taken to bedside.

## 2016-03-27 NOTE — ED Notes (Signed)
Report given to floor RN. Pt taken to floor via stretcher. Vital signs stable prior to transport.  

## 2016-03-27 NOTE — ED Notes (Signed)
SOC calling for consult.

## 2016-03-27 NOTE — Consult Note (Signed)
[facility] [place] [facility]   PATIENT NAME: Greg PonderRichard [name]    MR#:  [id number]  DATE OF BIRTH:  05/22/1966  DATE OF ADMISSION:  03/27/2016  PRIMARY CARE PHYSICIAN: Evelene CroonNIEMEYER, MEINDERT, MD   CONSULT REQUESTING/REFERRING PHYSICIAN: Dr. Orvis BrillLoflin  REASON FOR CONSULT: Hypertension, Bipolar disorder  CHIEF COMPLAINT:   Chief Complaint  Patient presents with  . Flank Pain    HISTORY OF PRESENT ILLNESS:  Greg [name]  is a 50 y.o. male with a known history of Hypertension, diabetes and bipolar disorder with frequent episodes of mania presents to the hospital complaining of right-sided abdominal and flank pain. Patient was also found to have leukocytosis. Had a CT scan of the abdomen with renal stone protocol and found to have acute cholecystitis with a stone in the neck of gallbladder. He is afebrile. Blood pressure has been elevated into the 180s to 190s systolic. No recent change in medications. Overall patient does not know his medications well and mentions he might miss doses at times. It is unclear if he took his medications today.  His last surgery was 6 years back when he had appendectomy with no complications.  Good functional status.  PAST MEDICAL HISTORY:   Past Medical History:  Diagnosis Date  . Bipolar 1 disorder (HCC)   . Diabetes (HCC)   . Dyslipidemia   . HTN (hypertension)   . Obesity   . Paranoia (HCC)   . Schizo affective schizophrenia (HCC)     PAST SURGICAL HISTOIRY:   Past Surgical History:  Procedure Laterality Date  . APPENDECTOMY    . TONSILLECTOMY      SOCIAL HISTORY:   Social History  Substance Use Topics  . Smoking status: Current Every Day Smoker    Packs/day: 1.00    Years: 8.00    Types: Cigarettes  . Smokeless tobacco: Current User    Types: Snuff  . Alcohol use No    FAMILY HISTORY:   Family History  Problem Relation Age of Onset  . Depression Mother   . Mental illness Mother   .  Depression Father   . Mental illness Father   . Depression Sister   . Mental illness Sister     DRUG ALLERGIES:   Allergies  Allergen Reactions  . Haldol [Haloperidol] Anaphylaxis  . Risperdal [Risperidone] Other (See Comments)    Reaction: unknown   . Seroquel [Quetiapine Fumarate] Other (See Comments)    Reaction: unknown     REVIEW OF SYSTEMS:   ROS  CONSTITUTIONAL: No fever, fatigue or weakness.  EYES: No blurred or double vision.  EARS, NOSE, AND THROAT: No tinnitus or ear pain.  RESPIRATORY: No cough, shortness of breath, wheezing or hemoptysis.  CARDIOVASCULAR: No chest pain, orthopnea, edema.  GASTROINTESTINAL: No nausea, vomiting, diarrhea. Complains of right-sided abdominal pain GENITOURINARY: No dysuria, hematuria.  ENDOCRINE: No polyuria, nocturia,  HEMATOLOGY: No anemia, easy bruising or bleeding SKIN: No rash or lesion. MUSCULOSKELETAL: No joint pain or arthritis.   NEUROLOGIC: No tingling, numbness, weakness.  PSYCHIATRY: No anxiety or depression.   MEDICATIONS AT HOME:   Prior to Admission medications   Medication Sig Start Date End Date Taking? Authorizing Provider  divalproex (DEPAKOTE) 250 MG DR tablet Take 3 tablets (750 mg total) by mouth every 12 (twelve) hours. 10/12/15  Yes [name], MD  lithium carbonate (ESKALITH) 450 MG CR tablet Take 1 tablet (450 mg total) by mouth every 12 (twelve) hours. 10/12/15  Yes [name],  MD  amantadine (SYMMETREL) 100 MG capsule Take 1 capsule (100 mg total) by mouth 2 (two) times daily. Patient not taking: Reported on 03/27/2016 10/12/15   Shari Prows, MD  clonazePAM (KLONOPIN) 1 MG tablet Take 1 tablet (1 mg total) by mouth every 8 (eight) hours as needed for anxiety. 10/12/15   Shari Prows, MD  metFORMIN (GLUCOPHAGE) 1000 MG tablet Take 1 tablet (1,000 mg total) by mouth 2 (two) times daily with a meal. Patient not taking: Reported on 03/27/2016 10/12/15   Shari Prows, MD   metoprolol succinate (TOPROL-XL) 50 MG 24 hr tablet Take 1 tablet (50 mg total) by mouth daily. Take with or immediately following a meal. Patient not taking: Reported on 03/27/2016 10/12/15   Shari Prows, MD  paliperidone (INVEGA SUSTENNA) 234 MG/1.5ML SUSP injection Inject 234 mg into the muscle every 21 ( twenty-one) days. Patient not taking: Reported on 03/27/2016 10/18/15   Shari Prows, MD  paliperidone (INVEGA) 6 MG 24 hr tablet Take 1 tablet (6 mg total) by mouth daily. Patient not taking: Reported on 03/27/2016 10/12/15   Shari Prows, MD  primidone (MYSOLINE) 50 MG tablet Take 1 tablet (50 mg total) by mouth every 8 (eight) hours. Patient not taking: Reported on 03/27/2016 10/12/15   Shari Prows, MD  propranolol (INDERAL) 20 MG tablet  01/28/16   Historical Provider, MD  simvastatin (ZOCOR) 10 MG tablet Take 1 tablet (10 mg total) by mouth at bedtime. Patient not taking: Reported on 03/27/2016 10/12/15   Shari Prows, MD  temazepam (RESTORIL) 30 MG capsule Take 1 capsule (30 mg total) by mouth at bedtime. Patient not taking: Reported on 03/27/2016 10/12/15   Shari Prows, MD      VITAL SIGNS:  Blood pressure (!) 179/97, pulse (!) 114, temperature 98.3 F (36.8 C), temperature source Oral, resp. rate (!) 25, height 5\' 10"  (1.778 m), weight 113.4 kg (250 lb), SpO2 95 %.  PHYSICAL EXAMINATION:  GENERAL:  50 y.o.-year-old patient lying in the bed with no acute distress.  EYES: Pupils equal, round, reactive to light and accommodation. No scleral icterus. Extraocular muscles intact.  HEENT: Head atraumatic, normocephalic. Oropharynx and nasopharynx clear.  NECK:  Supple, no jugular venous distention. No thyroid enlargement, no tenderness.  LUNGS: Normal breath sounds bilaterally, no wheezing, rales,rhonchi or crepitation. No use of accessory muscles of respiration.  CARDIOVASCULAR: S1, S2 normal. No murmurs, rubs, or gallops.  ABDOMEN: Soft,  nontender, nondistended. Bowel sounds present. No organomegaly or mass.  EXTREMITIES: No pedal edema, cyanosis, or clubbing.  NEUROLOGIC: Cranial nerves II through XII are intact. Muscle strength 5/5 in all extremities. Sensation intact. Gait not checked.  PSYCHIATRIC: The patient is alert and Awake. SKIN: No obvious rash, lesion, or ulcer.   LABORATORY PANEL:   CBC  Recent Labs Lab 03/27/16 0334  WBC 18.0*  HGB 15.1  HCT 44.6  PLT 237   ------------------------------------------------------------------------------------------------------------------  Chemistries   Recent Labs Lab 03/27/16 0334  NA 129*  K 4.1  CL 98*  CO2 24  GLUCOSE 303*  BUN 15  CREATININE 0.97  CALCIUM 9.5  AST 15  ALT 11*  ALKPHOS 61  BILITOT 0.7   ------------------------------------------------------------------------------------------------------------------  Cardiac Enzymes No results for input(s): TROPONINI in the last 168 hours. ------------------------------------------------------------------------------------------------------------------  RADIOLOGY:  Ct Renal Stone Study  Result Date: 03/27/2016 CLINICAL DATA:  Right flank pain EXAM: CT ABDOMEN AND PELVIS WITHOUT CONTRAST TECHNIQUE: Multidetector CT imaging of the abdomen and pelvis  was performed following the standard protocol without IV contrast. COMPARISON:  CT abdomen pelvis 08/04/2012 FINDINGS: Lower chest:  Lung bases clear Hepatobiliary: Large calcified gallstone in the neck of the gallbladder measuring 2.6 cm. There is mild gallbladder wall thickening with edema in the fat surrounding the gallbladder. Findings suggest acute cholecystitis. Bile ducts nondilated. Liver is normal. Pancreas: Negative Spleen: Negative Adrenals/Urinary Tract: Negative for urinary tract calculi. No renal obstruction or mass. Mild bladder wall thickening. Stomach/Bowel: Stomach and duodenum normal. Negative for bowel obstruction. Mild colonic  diverticulosis with diverticulum the left colon and sigmoid colon. Negative for diverticulitis. Appendectomy clips. Vascular/Lymphatic: Aorta and IVC within normal limits. Negative for lymphadenopathy Reproductive: Mild to moderate prostate enlargement with mild bladder wall thickening. Other: No free fluid.  Negative for hernia Musculoskeletal: Negative IMPRESSION: 2.6 cm calcified gallstone in the neck of the gallbladder. Gallbladder wall thickening and surrounding edema suggests acute cholecystitis. Mild colonic diverticulosis without diverticulitis Electronically Signed   By: Marlan Palau M.D.   On: 03/27/2016 08:18    EKG:   Orders placed or performed during the hospital encounter of 08/27/15  . ED EKG  . ED EKG  . EKG 12-Lead  . EKG 12-Lead    IMPRESSION AND PLAN:   * Acute cholecystitis Patient has been started on IV Zosyn. We'll change his essential medications to IV while he is nothing by mouth. Further management as per Dr. Orvis Brill of surgery  * Hypertension We'll place him on Lopressor IV 5 mg every 8 hours scheduled. Hydralazine when necessary. Restart home medications when able to take by mouth  * Diabetes mellitus type 2 Hold metformin and start on sliding scale insulin. Added Accu-Cheks every 4 hours.  * Bipolar disorder We'll change his Depakote to IV.  * DVT prophylaxis SCDs added. Lovenox to be started by primary team as per his surgery scheduled.  All the records are reviewed and case discussed with Consulting provider. Management plans discussed with the patient, family and they are in agreement.  TOTAL TIME TAKING CARE OF THIS PATIENT: 40 minutes.   Milagros Loll R M.D on 03/27/2016 at 5:00 PM  Between 7am to 6pm - Pager - [phone]  After 6pm go to [email or web address] - password [email or web address] [facility]  [facility]  Office  [phone]  CC: Primary care Physician: Evelene Croon, MD  Note: This dictation was prepared with Dragon dictation  along with smaller phrase technology. Any transcriptional errors that result from this process are unintentional.

## 2016-03-27 NOTE — ED Notes (Signed)
Blood specimen was sent to the lab,

## 2016-03-27 NOTE — ED Triage Notes (Signed)
Patient ambulatory to triage with steady gait, without difficulty or distress noted; pt reports x 3 days having right flank/side pain with no accomp symptoms

## 2016-03-27 NOTE — ED Notes (Signed)
Pt c/o R flank pain x 3 days. Nausea yesterday, denies v/d.

## 2016-03-28 DIAGNOSIS — F250 Schizoaffective disorder, bipolar type: Secondary | ICD-10-CM

## 2016-03-28 LAB — COMPREHENSIVE METABOLIC PANEL
ALT: 25 U/L (ref 17–63)
AST: 26 U/L (ref 15–41)
Albumin: 2.9 g/dL — ABNORMAL LOW (ref 3.5–5.0)
Alkaline Phosphatase: 100 U/L (ref 38–126)
Anion gap: 8 (ref 5–15)
BUN: 15 mg/dL (ref 6–20)
CO2: 21 mmol/L — ABNORMAL LOW (ref 22–32)
Calcium: 8.5 mg/dL — ABNORMAL LOW (ref 8.9–10.3)
Chloride: 106 mmol/L (ref 101–111)
Creatinine, Ser: 0.84 mg/dL (ref 0.61–1.24)
GFR calc Af Amer: >60 mL/min (ref >60)
GFR calc non Af Amer: >60 mL/min (ref >60)
Glucose, Bld: 130 mg/dL — ABNORMAL HIGH (ref 65–99)
Potassium: 3.9 mmol/L (ref 3.5–5.1)
Sodium: 135 mmol/L (ref 135–145)
Total Bilirubin: 0.5 mg/dL (ref 0.3–1.2)
Total Protein: 6.4 g/dL — ABNORMAL LOW (ref 6.5–8.1)

## 2016-03-28 LAB — BASIC METABOLIC PANEL
Anion gap: 3 — ABNORMAL LOW (ref 5–15)
BUN: 11 mg/dL (ref 6–20)
CO2: 27 mmol/L (ref 22–32)
Calcium: 8.5 mg/dL — ABNORMAL LOW (ref 8.9–10.3)
Chloride: 104 mmol/L (ref 101–111)
Creatinine, Ser: 0.93 mg/dL (ref 0.61–1.24)
GFR calc Af Amer: >60 mL/min (ref >60)
GFR calc non Af Amer: >60 mL/min (ref >60)
Glucose, Bld: 196 mg/dL — ABNORMAL HIGH (ref 65–99)
Potassium: 4.4 mmol/L (ref 3.5–5.1)
Sodium: 134 mmol/L — ABNORMAL LOW (ref 135–145)

## 2016-03-28 LAB — GLUCOSE, CAPILLARY
Glucose-Capillary: 127 mg/dL — ABNORMAL HIGH (ref 65–99)
Glucose-Capillary: 169 mg/dL — ABNORMAL HIGH (ref 65–99)
Glucose-Capillary: 170 mg/dL — ABNORMAL HIGH (ref 65–99)
Glucose-Capillary: 199 mg/dL — ABNORMAL HIGH (ref 65–99)
Glucose-Capillary: 208 mg/dL — ABNORMAL HIGH (ref 65–99)
Glucose-Capillary: 217 mg/dL — ABNORMAL HIGH (ref 65–99)
Glucose-Capillary: 217 mg/dL — ABNORMAL HIGH (ref 65–99)

## 2016-03-28 LAB — URINE CULTURE
Culture: NO GROWTH
Special Requests: NORMAL

## 2016-03-28 LAB — CBC
HCT: 38.8 % — ABNORMAL LOW (ref 40.0–52.0)
Hemoglobin: 13.1 g/dL (ref 13.0–18.0)
MCH: 28.8 pg (ref 26.0–34.0)
MCHC: 33.7 g/dL (ref 32.0–36.0)
MCV: 85.7 fL (ref 80.0–100.0)
Platelets: 217 10*3/uL (ref 150–440)
RBC: 4.53 MIL/uL (ref 4.40–5.90)
RDW: 13.9 % (ref 11.5–14.5)
WBC: 15 10*3/uL — ABNORMAL HIGH (ref 3.8–10.6)

## 2016-03-28 LAB — HEMOGLOBIN A1C: Hgb A1c MFr Bld: 12.2 % — ABNORMAL HIGH (ref 4.0–6.0)

## 2016-03-28 LAB — SODIUM, URINE, RANDOM: Sodium, Ur: 53 mmol/L

## 2016-03-28 LAB — LACTIC ACID, PLASMA: Lactic Acid, Venous: 1 mmol/L (ref 0.5–1.9)

## 2016-03-28 LAB — OSMOLALITY, URINE: Osmolality, Ur: 894 mOsm/kg (ref 300–900)

## 2016-03-28 MED ORDER — TEMAZEPAM 15 MG PO CAPS: 15.0000 mg | ORAL_CAPSULE | Freq: Every evening | ORAL | Status: DC | PRN

## 2016-03-28 NOTE — Progress Notes (Signed)
Pt with new onset nocturnal enuresis.  On psych meds.  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 Check urine sodium and osmolality.

## 2016-03-28 NOTE — Progress Notes (Signed)
[facility] -  at [facility]   PATIENT NAME: Greg PonderRichard [name]    MR#:  [id number]  DATE OF BIRTH:  04/18/1966  SUBJECTIVE:  CHIEF COMPLAINT:  Patient is  resting comfortably, sitter at bedside denies any abdominal pain. Answers questions but falling asleep.   REVIEW OF SYSTEMS:  CONSTITUTIONAL: No fever, fatigue or weakness.  EYES: No blurred or double vision.  EARS, NOSE, AND THROAT: No tinnitus or ear pain.  RESPIRATORY: No cough, shortness of breath, wheezing or hemoptysis.  CARDIOVASCULAR: No chest pain, orthopnea, edema.  GASTROINTESTINAL: No nausea, vomiting, diarrhea or abdominal pain.  GENITOURINARY: No dysuria, hematuria.  ENDOCRINE: No polyuria, nocturia,  HEMATOLOGY: No anemia, easy bruising or bleeding SKIN: No rash or lesion. MUSCULOSKELETAL: No joint pain or arthritis.   NEUROLOGIC: No tingling, numbness, weakness.  PSYCHIATRY: No anxiety or depression.   DRUG ALLERGIES:   Allergies  Allergen Reactions  . Haldol [Haloperidol] Anaphylaxis  . Risperdal [Risperidone] Other (See Comments)    Reaction: unknown   . Seroquel [Quetiapine Fumarate] Other (See Comments)    Reaction: unknown     VITALS:  Blood pressure (!) 146/71, pulse 98, temperature 98.3 F (36.8 C), temperature source Oral, resp. rate 20, height 5\' 10"  (1.778 m), weight 113.9 kg (251 lb), SpO2 91 %.  PHYSICAL EXAMINATION:  GENERAL:  50 y.o.-year-old patient lying in the bed with no acute distress.  EYES: Pupils equal, round, reactive to light and accommodation. No scleral icterus. Extraocular muscles intact.  HEENT: Head atraumatic, normocephalic. Oropharynx and nasopharynx clear.  NECK:  Supple, no jugular venous distention. No thyroid enlargement, no tenderness.  LUNGS: Normal breath sounds bilaterally, no wheezing, rales,rhonchi or crepitation. No use of accessory muscles of respiration.  CARDIOVASCULAR: S1, S2 normal. No murmurs, rubs, or gallops.  ABDOMEN:  Soft, nontender, nondistended. Bowel sounds present. No organomegaly or mass.  EXTREMITIES: No pedal edema, cyanosis, or clubbing.  NEUROLOGIC: Cranial nerves II through XII are intact. Muscle strength 5/5 in all extremities. Sensation intact. Gait not checked.  PSYCHIATRIC: The patient is alert and oriented x 3.  SKIN: No obvious rash, lesion, or ulcer.    LABORATORY PANEL:   CBC  Recent Labs Lab 03/28/16 0525  WBC 15.0*  HGB 13.1  HCT 38.8*  PLT 217   ------------------------------------------------------------------------------------------------------------------  Chemistries   Recent Labs Lab 03/27/16 0334 03/28/16 0525  NA 129* 134*  K 4.1 4.4  CL 98* 104  CO2 24 27  GLUCOSE 303* 196*  BUN 15 11  CREATININE 0.97 0.93  CALCIUM 9.5 8.5*  AST 15  --   ALT 11*  --   ALKPHOS 61  --   BILITOT 0.7  --    ------------------------------------------------------------------------------------------------------------------  Cardiac Enzymes No results for input(s): TROPONINI in the last 168 hours. ------------------------------------------------------------------------------------------------------------------  RADIOLOGY:  Ct Renal Stone Study  Result Date: 03/27/2016 CLINICAL DATA:  Right flank pain EXAM: CT ABDOMEN AND PELVIS WITHOUT CONTRAST TECHNIQUE: Multidetector CT imaging of the abdomen and pelvis was performed following the standard protocol without IV contrast. COMPARISON:  CT abdomen pelvis 08/04/2012 FINDINGS: Lower chest:  Lung bases clear Hepatobiliary: Large calcified gallstone in the neck of the gallbladder measuring 2.6 cm. There is mild gallbladder wall thickening with edema in the fat surrounding the gallbladder. Findings suggest acute cholecystitis. Bile ducts nondilated. Liver is normal. Pancreas: Negative Spleen: Negative Adrenals/Urinary Tract: Negative for urinary tract calculi. No renal obstruction or mass. Mild bladder wall thickening. Stomach/Bowel:  Stomach and duodenum normal. Negative for bowel  obstruction. Mild colonic diverticulosis with diverticulum the left colon and sigmoid colon. Negative for diverticulitis. Appendectomy clips. Vascular/Lymphatic: Aorta and IVC within normal limits. Negative for lymphadenopathy Reproductive: Mild to moderate prostate enlargement with mild bladder wall thickening. Other: No free fluid.  Negative for hernia Musculoskeletal: Negative IMPRESSION: 2.6 cm calcified gallstone in the neck of the gallbladder. Gallbladder wall thickening and surrounding edema suggests acute cholecystitis. Mild colonic diverticulosis without diverticulitis Electronically Signed   By: Marlan Palau M.D.   On: 03/27/2016 08:18    EKG:   Orders placed or performed during the hospital encounter of 08/27/15  . ED EKG  . ED EKG  . EKG 12-Lead  . EKG 12-Lead    ASSESSMENT AND PLAN:   * Acute cholecystitis Continue IV Zosyn. Further management as per Dr. Orvis Brill of surgery  * Hypertension Patient is currently nothing by mouth  on Lopressor IV 5 mg every 8 hours scheduled. Hydralazine when necessary. Restart home medications when able to take by mouth  * Diabetes mellitus type 2 Hold metformin   on sliding scale insulin. Added Accu-Cheks every 4 hours.  * Bipolar disorder Continue Depakote IV, changed to by mouth once patient is able to take by mouth Psychiatry was consulted in view of psychosis which is pending  * DVT prophylaxis SCDs added. Lovenox to be started by primary team as per his surgery scheduled.  All the records are reviewed and case discussed with RN Management plans discussed with the patient, family and they are in agreement.     All the records are reviewed and case discussed with Care Management/Social Workerr. Management plans discussed with the patient, family and they are in agreement.  CODE STATUS: fc  TOTAL TIME TAKING CARE OF THIS PATIENT: 36 minutes.     Note: This dictation  was prepared with Dragon dictation along with smaller phrase technology. Any transcriptional errors that result from this process are unintentional.   Ramonita Lab M.D on 03/28/2016 at 3:41 PM  Between 7am to 6pm - Pager - [phone] After 6pm go to [email or web address] - password [email or web address] [facility]  [place] [facility]  Office  [phone]  CC: Primary care physician; Evelene Croon, MD

## 2016-03-28 NOTE — Progress Notes (Signed)
Pt reported that several weeks ago he began experiencing nocturnal enuresis. Reported to on-call provider, Dr. Sheryle Hailiamond.

## 2016-03-28 NOTE — Progress Notes (Signed)
Inpatient Diabetes Program Recommendations  AACE/ADA: New Consensus Statement on Inpatient Glycemic Control (2015)  Target Ranges:  Prepandial:   less than 140 mg/dL      Peak postprandial:   less than 180 mg/dL (1-2 hours)      Critically ill patients:  140 - 180 mg/dL   Lab Results  Component Value Date   GLUCAP 170 (H) 03/28/2016   HGBA1C 12.2 (H) 10/07/2015    Review of Glycemic ControlResults for Greg [name], [name] (MRN [id number]) as of 03/28/2016 12:22  Ref. Range 03/27/2016 20:59 03/28/2016 00:34 03/28/2016 04:17 03/28/2016 07:22 03/28/2016 11:29  Glucose-Capillary Latest Ref Range: 65 - 99 mg/dL 213228 (H) 086217 (H) 578208 (H) 169 (H) 170 (H)   Diabetes history: Diabetes Outpatient Diabetes medications: Metformin 1000 mg bid Current orders for Inpatient glycemic control:  Novolog moderate q 4 hours  Inpatient Diabetes Program Recommendations:    Note that A1C is pending however in March of 2017 it was 12.2%.  May consider adding Lantus 15 units daily while patient is in the hospital and titrate.  Thanks, Beryl MeagerJenny Kamala Kolton, RN, BC-ADM Inpatient Diabetes Coordinator Pager [phone] (8a-5p)

## 2016-03-28 NOTE — Consult Note (Signed)
[facility] Face-to-Face Psychiatry Consult   Reason for Consult:  Consult for 50 year old man with a history of schizoaffective disorder or bipolar disorder who is actually in the hospital for possible cholecystitis. Concern about emotional and thought stability. Referring Physician:  Gouru Patient Identification: Greg [name] MRN:  [id number] Principal Diagnosis: Schizoaffective disorder [facility]) Diagnosis:   Patient Active Problem List   Diagnosis Date Noted  . Schizoaffective disorder ([place]) [F25.9] 03/28/2016  . Acute cholecystitis with chronic cholecystitis [K81.2] 03/27/2016  . Anxiety [F41.9]   . 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 Bipolar 2 disorder ([place]) [F31.81]   . Hyponatremia [E87.1] 10/06/2015  . Bipolar affective disorder, currently manic, severe, with psychotic features ([place]) [F31.2] 11/28/2014  . Essential hypertension [I10] 11/28/2014  . Dyslipidemia [E78.5] 11/28/2014  . Obesity [E66.9] 11/28/2014  . Diabetes ([place]) [E11.9] 11/28/2014  . Tobacco use disorder [F17.200] 11/28/2014    Total Time spent with patient: 1 hour  Subjective:   Greg [name] is a 50 y.o. male patient admitted with "I was having back pain".  HPI:  Patient interviewed. Chart reviewed. Labs and vitals reviewed. 51 year old man with a history of chronic mental illness. He came to the emergency room yesterday because of several days of worsening back pain. Tentative diagnosis of cholecystitis. Yesterday in the emergency room the patient became acutely agitated and attempted to leave [place] and appeared to be not thinking clearly at the time. He was placed under IVC. On evaluation today the patient says he is feeling emotionally stable. He is able to describe for me several days of worsening back pain that led him to come to the emergency room. Emotionally he says he had been doing fine. Not feeling depressed agitated angry or manic. His sleep at home had been stable. He has not had  any recent new stresses. He says he has been compliant with his prescribed medicines for bipolar disorder and has been compliant with appointments to see his therapist and psychiatrist. He acknowledges that he was impulsive last night in trying to leave. He says the sound of the beeping intravenous machine was driving him crazy. Patient is able to express an understanding of having an inflamed gallbladder. Understands the differential diagnosis. Understands the potential risks of not having it treated. He states that he is awaiting a decision by the treatment team but knows that if necessary it would be better to go ahead and have the surgery. He denies any hallucinations. Denies suicidal or homicidal ideation. He is currently being given his lithium orally and being given his valproic acid has an intravenous. His Depakote level was in the low normal range on admission. No lithium level was obtained. He denies that he's been drinking or abusing any drugs.  Social history: Patient lives with his parents. Has no complaints about his social situation. Everything appears to be pretty stable. No complaints about his relationships.  Medical history: Overweight. Acute abdominal and back pain and possible gallbladder inflammation. He has a history of having had his tonsils and his appendix out in the past.  Substance abuse history: Denies any alcohol or drug abuse or past significant substance abuse problems.  Past Psychiatric History: Patient has a history of chronic mental illness either bipolar or schizoaffective disorder. He has had multiple hospitalizations most recently in March of this year. At that time he was manic. When manic he will often become hyperactive and hyper religious. He does not have any history of suicide attempts or serious violence in the past.  He has been stabilized on a combination of lithium and Depakote.  Risk to Self: Is patient at risk for suicide?: No Risk to Others:   Prior  Inpatient Therapy:   Prior Outpatient Therapy:    Past Medical History:  Past Medical History:  Diagnosis Date  . Bipolar 1 disorder ([place])   . Diabetes ([place])   . Dyslipidemia   . HTN (hypertension)   . Obesity   . Paranoia ([place])   . Schizo affective schizophrenia [facility])     Past Surgical History:  Procedure Laterality Date  . APPENDECTOMY    . TONSILLECTOMY     Family History:  Family History  Problem Relation Age of Onset  . Depression Mother   . Mental illness Mother   . Depression Father   . Mental illness Father   . Depression Sister   . Mental illness Sister    Family Psychiatric  History: Patient says there are several people in his family with bipolar disorder. Social History:  History  Alcohol Use No     History  Drug Use No    Social History   Social History  . Marital status: Single    Spouse name: N/A  . Number of children: N/A  . Years of education: N/A   Social History Main Topics  . Smoking status: Current Every Day Smoker    Packs/day: 1.00    Years: 8.00    Types: Cigarettes  . Smokeless tobacco: Current User    Types: Snuff  . Alcohol use No  . Drug use: No  . Sexual activity: No   Other Topics Concern  . None   Social History Narrative  . None   Additional Social History:    Allergies:   Allergies  Allergen Reactions  . Haldol [Haloperidol] Anaphylaxis  . Risperdal [Risperidone] Other (See Comments)    Reaction: unknown   . Seroquel [Quetiapine Fumarate] Other (See Comments)    Reaction: unknown     Labs:  Results for orders placed or performed during the hospital encounter of 03/27/16 (from the past 48 hour(s))  Urinalysis complete, with microscopic ([facility] only)     Status: Abnormal   Collection Time: 03/27/16  3:34 AM  Result Value Ref Range   Color, Urine YELLOW (A) YELLOW   APPearance CLEAR (A) CLEAR   Glucose, UA >500 (A) NEGATIVE mg/dL   Bilirubin Urine NEGATIVE NEGATIVE   Ketones, ur 2+ (A) NEGATIVE mg/dL    Specific Gravity, Urine 1.036 (H) 1.005 - 1.030   Hgb urine dipstick NEGATIVE NEGATIVE   pH 5.0 5.0 - 8.0   Protein, ur 100 (A) NEGATIVE mg/dL   Nitrite NEGATIVE NEGATIVE   Leukocytes, UA NEGATIVE NEGATIVE   RBC / HPF 0-5 0 - 5 RBC/hpf   WBC, UA 0-5 0 - 5 WBC/hpf   Bacteria, UA NONE SEEN NONE SEEN   Squamous Epithelial / LPF NONE SEEN NONE SEEN   Mucous PRESENT   CBC     Status: Abnormal   Collection Time: 03/27/16  3:34 AM  Result Value Ref Range   WBC 18.0 (H) 3.8 - 10.6 K/uL   RBC 5.27 4.40 - 5.90 MIL/uL   Hemoglobin 15.1 13.0 - 18.0 g/dL   HCT 44.6 40.0 - 52.0 %   MCV 84.7 80.0 - 100.0 fL   MCH 28.7 26.0 - 34.0 pg   MCHC 33.9 32.0 - 36.0 g/dL   RDW 13.5 11.5 - 14.5 %   Platelets 237 150 - 440 K/uL  Basic metabolic panel     Status: Abnormal   Collection Time: 03/27/16  3:34 AM  Result Value Ref Range   Sodium 129 (L) 135 - 145 mmol/L   Potassium 4.1 3.5 - 5.1 mmol/L   Chloride 98 (L) 101 - 111 mmol/L   CO2 24 22 - 32 mmol/L   Glucose, Bld 303 (H) 65 - 99 mg/dL   BUN 15 6 - 20 mg/dL   Creatinine, Ser 0.97 0.61 - 1.24 mg/dL   Calcium 9.5 8.9 - 10.3 mg/dL   GFR calc non Af Amer >60 >60 mL/min   GFR calc Af Amer >60 >60 mL/min    Comment: (NOTE) The eGFR has been calculated using the CKD EPI equation. This calculation has not been validated in all clinical situations. eGFR's persistently <60 mL/min signify possible Chronic Kidney Disease.    Anion gap 7 5 - 15  Hepatic function panel     Status: Abnormal   Collection Time: 03/27/16  3:34 AM  Result Value Ref Range   Total Protein 7.8 6.5 - 8.1 g/dL   Albumin 3.7 3.5 - 5.0 g/dL   AST 15 15 - 41 U/L   ALT 11 (L) 17 - 63 U/L   Alkaline Phosphatase 61 38 - 126 U/L   Total Bilirubin 0.7 0.3 - 1.2 mg/dL   Bilirubin, Direct <0.1 (L) 0.1 - 0.5 mg/dL   Indirect Bilirubin NOT CALCULATED 0.3 - 0.9 mg/dL  Lipase, blood     Status: None   Collection Time: 03/27/16  3:34 AM  Result Value Ref Range   Lipase 19 11 - 51 U/L   Valproic acid level     Status: None   Collection Time: 03/27/16  3:34 AM  Result Value Ref Range   Valproic Acid Lvl 67 50.0 - 100.0 ug/mL  Urine culture     Status: None   Collection Time: 03/27/16  3:34 AM  Result Value Ref Range   Specimen Description URINE, CLEAN CATCH    Special Requests Normal    Culture NO GROWTH Performed at [facility]     Report Status 03/28/2016 FINAL   Hemoglobin A1c     Status: Abnormal   Collection Time: 03/27/16  3:34 AM  Result Value Ref Range   Hgb A1c MFr Bld 12.2 (H) 4.0 - 6.0 %  Lactic acid, plasma     Status: None   Collection Time: 03/27/16  4:33 PM  Result Value Ref Range   Lactic Acid, Venous 1.0 0.5 - 1.9 mmol/L  Glucose, capillary     Status: Abnormal   Collection Time: 03/27/16  8:59 PM  Result Value Ref Range   Glucose-Capillary 228 (H) 65 - 99 mg/dL  Lactic acid, plasma     Status: None   Collection Time: 03/27/16 11:51 PM  Result Value Ref Range   Lactic Acid, Venous 1.0 0.5 - 1.9 mmol/L  Glucose, capillary     Status: Abnormal   Collection Time: 03/28/16 12:34 AM  Result Value Ref Range   Glucose-Capillary 217 (H) 65 - 99 mg/dL  Glucose, capillary     Status: Abnormal   Collection Time: 03/28/16  4:17 AM  Result Value Ref Range   Glucose-Capillary 208 (H) 65 - 99 mg/dL  Basic metabolic panel     Status: Abnormal   Collection Time: 03/28/16  5:25 AM  Result Value Ref Range   Sodium 134 (L) 135 - 145 mmol/L   Potassium 4.4 3.5 - 5.1 mmol/L  Chloride 104 101 - 111 mmol/L   CO2 27 22 - 32 mmol/L   Glucose, Bld 196 (H) 65 - 99 mg/dL   BUN 11 6 - 20 mg/dL   Creatinine, Ser 0.93 0.61 - 1.24 mg/dL   Calcium 8.5 (L) 8.9 - 10.3 mg/dL   GFR calc non Af Amer >60 >60 mL/min   GFR calc Af Amer >60 >60 mL/min    Comment: (NOTE) The eGFR has been calculated using the CKD EPI equation. This calculation has not been validated in all clinical situations. eGFR's persistently <60 mL/min signify possible Chronic  Kidney Disease.    Anion gap 3 (L) 5 - 15  CBC     Status: Abnormal   Collection Time: 03/28/16  5:25 AM  Result Value Ref Range   WBC 15.0 (H) 3.8 - 10.6 K/uL   RBC 4.53 4.40 - 5.90 MIL/uL   Hemoglobin 13.1 13.0 - 18.0 g/dL   HCT 38.8 (L) 40.0 - 52.0 %   MCV 85.7 80.0 - 100.0 fL   MCH 28.8 26.0 - 34.0 pg   MCHC 33.7 32.0 - 36.0 g/dL   RDW 13.9 11.5 - 14.5 %   Platelets 217 150 - 440 K/uL  Osmolality, urine     Status: None   Collection Time: 03/28/16  6:43 AM  Result Value Ref Range   Osmolality, Ur 894 300 - 900 mOsm/kg  Sodium, urine, random     Status: None   Collection Time: 03/28/16  6:43 AM  Result Value Ref Range   Sodium, Ur 53 mmol/L  Glucose, capillary     Status: Abnormal   Collection Time: 03/28/16  7:22 AM  Result Value Ref Range   Glucose-Capillary 169 (H) 65 - 99 mg/dL  Glucose, capillary     Status: Abnormal   Collection Time: 03/28/16 11:29 AM  Result Value Ref Range   Glucose-Capillary 170 (H) 65 - 99 mg/dL  Glucose, capillary     Status: Abnormal   Collection Time: 03/28/16  4:05 PM  Result Value Ref Range   Glucose-Capillary 127 (H) 65 - 99 mg/dL   Comment 1 Notify RN   Comprehensive metabolic panel     Status: Abnormal   Collection Time: 03/28/16  4:37 PM  Result Value Ref Range   Sodium 135 135 - 145 mmol/L   Potassium 3.9 3.5 - 5.1 mmol/L   Chloride 106 101 - 111 mmol/L   CO2 21 (L) 22 - 32 mmol/L   Glucose, Bld 130 (H) 65 - 99 mg/dL   BUN 15 6 - 20 mg/dL   Creatinine, Ser 0.84 0.61 - 1.24 mg/dL   Calcium 8.5 (L) 8.9 - 10.3 mg/dL   Total Protein 6.4 (L) 6.5 - 8.1 g/dL   Albumin 2.9 (L) 3.5 - 5.0 g/dL   AST 26 15 - 41 U/L   ALT 25 17 - 63 U/L   Alkaline Phosphatase 100 38 - 126 U/L   Total Bilirubin 0.5 0.3 - 1.2 mg/dL   GFR calc non Af Amer >60 >60 mL/min   GFR calc Af Amer >60 >60 mL/min    Comment: (NOTE) The eGFR has been calculated using the CKD EPI equation. This calculation has not been validated in all clinical  situations. eGFR's persistently <60 mL/min signify possible Chronic Kidney Disease.    Anion gap 8 5 - 15    Current Facility-Administered Medications  Medication Dose Route Frequency Provider Last Rate Last Dose  . 0.9 %  sodium chloride infusion  Intravenous Continuous Joanne Gavel, MD 100 mL/hr at 03/28/16 1722    . acetaminophen (TYLENOL) tablet 1,000 mg  1,000 mg Oral Q6H Hubbard Robinson, MD   1,000 mg at 03/28/16 1722  . hydrALAZINE (APRESOLINE) injection 10 mg  10 mg Intravenous Q6H PRN Srikar Sudini, MD      . insulin aspart (novoLOG) injection 0-15 Units  0-15 Units Subcutaneous Q4H Hillary Bow, MD   2 Units at 03/28/16 1628  . ketorolac (TORADOL) 30 MG/ML injection 30 mg  30 mg Intravenous Q6H Hubbard Robinson, MD   30 mg at 03/28/16 1722  . lithium carbonate (ESKALITH) CR tablet 450 mg  450 mg Oral Q12H Srikar Sudini, MD   450 mg at 03/28/16 1000  . LORazepam (ATIVAN) injection 0.5 mg  0.5 mg Intravenous Q6H PRN Srikar Sudini, MD      . metoprolol (LOPRESSOR) injection 5 mg  5 mg Intravenous Q8H Srikar Sudini, MD   5 mg at 03/28/16 1506  . nicotine (NICODERM CQ - dosed in mg/24 hours) patch 21 mg  21 mg Transdermal Daily Hubbard Robinson, MD   21 mg at 03/28/16 1053  . ondansetron (ZOFRAN-ODT) disintegrating tablet 4 mg  4 mg Oral Q6H PRN Hubbard Robinson, MD       Or  . ondansetron (ZOFRAN) injection 4 mg  4 mg Intravenous Q6H PRN Hubbard Robinson, MD      . oxyCODONE (Oxy IR/ROXICODONE) immediate release tablet 5-10 mg  5-10 mg Oral Q3H PRN Hubbard Robinson, MD   5 mg at 03/27/16 2242  . piperacillin-tazobactam (ZOSYN) IVPB 3.375 g  3.375 g Intravenous Q8H Hubbard Robinson, MD   3.375 g at 03/28/16 1506  . temazepam (RESTORIL) capsule 15 mg  15 mg Oral QHS PRN Gonzella Lex, MD      . valproate (DEPACON) 500 mg in dextrose 5 % 50 mL IVPB  500 mg Intravenous Q12H Srikar Sudini, MD   500 mg at 03/28/16 1053  . ziprasidone (GEODON) injection 10 mg  10 mg  Intramuscular Q6H PRN Hubbard Robinson, MD        Musculoskeletal: Strength & Muscle Tone: within normal limits Gait & Station: normal Patient leans: N/A  Psychiatric Specialty Exam: Physical Exam  Nursing note and vitals reviewed. Constitutional: He appears well-developed and well-nourished.  HENT:  Head: Normocephalic and atraumatic.  Eyes: Conjunctivae are normal. Pupils are equal, round, and reactive to light.  Neck: Normal range of motion.  Cardiovascular: Normal heart sounds.   Respiratory: Effort normal. No respiratory distress.  GI: Soft.  Musculoskeletal: Normal range of motion.  Neurological: He is alert.  Skin: Skin is warm and dry.  Psychiatric: He has a normal mood and affect. His speech is normal and behavior is normal. Judgment and thought content normal. Cognition and memory are normal.    Review of Systems  Constitutional: Negative.   HENT: Negative.   Eyes: Negative.   Respiratory: Negative.   Cardiovascular: Negative.   Gastrointestinal: Negative.   Musculoskeletal: Positive for back pain.  Skin: Negative.   Neurological: Negative.   Psychiatric/Behavioral: Negative for depression, hallucinations, memory loss, substance abuse and suicidal ideas. The patient is not nervous/anxious and does not have insomnia.     Blood pressure (!) 146/71, pulse 98, temperature 98.3 F (36.8 C), temperature source Oral, resp. rate 20, height 5' 10"  (1.778 m), weight 113.9 kg (251 lb), SpO2 91 %.Body mass index is 36.01 kg/m.  General Appearance: Casual  Eye Contact:  Good  Speech:  Clear and Coherent  Volume:  Normal  Mood:  Euthymic  Affect:  Constricted  Thought Process:  Goal Directed  Orientation:  Full (Time, Place, and Person)  Thought Content:  Logical  Suicidal Thoughts:  No  Homicidal Thoughts:  No  Memory:  Immediate;   Good Recent;   Fair Remote;   Fair  Judgement:  Fair  Insight:  Fair  Psychomotor Activity:  Decreased  Concentration:   Concentration: Fair  Recall:  [employer] of Knowledge:  Fair  Language:  Fair  Akathisia:  No  Handed:  Right  AIMS (if indicated):     Assets:  Communication Skills Desire for Improvement Financial Resources/Insurance Housing Physical Health Resilience Social Support  ADL's:  Intact  Cognition:  WNL  Sleep:        Treatment Plan Summary: Daily contact with patient to assess and evaluate symptoms and progress in treatment, Medication management and Plan 50 year old man with bipolar disorder versus schizoaffective disorder who is mentally stable and at his baseline. He got agitated last night and he is probably still sensitive to getting agitated easily if things were to be upsetting to him but he is also able to articulate his concerns. Patient is not acutely dangerous to himself and does not meet commitment criteria. I have canceled the involuntary commitment order and will complete the paperwork. Patient does not require a sitter. Continuing his medication as prescribed now is fine. Patient should be able to give consent to a procedure if necessary. I will continue to follow up daily. If concerns about capacity arise I will be happy to reassess that issue. I have put in an order for when necessary sleeping medicine tonight just in case he needs it. Restoril 15 mg as needed.  Disposition: Patient does not meet criteria for psychiatric inpatient admission. Supportive therapy provided about ongoing stressors.  Alethia Berthold, MD 03/28/2016 6:47 PM

## 2016-03-28 NOTE — Progress Notes (Signed)
50 year old male who was admitted for acute cholecystitis with some medical issues to include bipolar disorder and incident in the emergency room which caused him to have IVC paperwork. The patient has been normal to my examining good spirits thought and judgment. The patient states that the pain that he was having in his back and flank has since resolved. The patient states he's been doing well here in the hospital and only complains of some pain in the IV in his right arm at this time. The patient denies any nausea or any fever and chills and would like to have something to drink.  Vitals:   03/28/16 1226 03/28/16 2005  BP: (!) 146/71 (!) 152/83  Pulse: 98 94  Resp: 20 20  Temp: 98.3 F (36.8 C) 98.6 F (37 C)   I/O last 3 completed shifts: In: 268.7 [I.V.:179.6; IV Piggyback:89.1] Out: 575 [Urine:575] No intake/output data recorded.   PE:  Gen: NAD, AAOx3, good judgement GI: soft, non tender, no right flank or CVA tenderness today RUE: some edema surrounding the iv site  CBC Latest Ref Rng & Units 03/28/2016 03/27/2016 10/06/2015  WBC 3.8 - 10.6 K/uL 15.0(H) 18.0(H) 7.4  Hemoglobin 13.0 - 18.0 g/dL 32.413.1 40.115.1 02.714.1  Hematocrit 40.0 - 52.0 % 38.8(L) 44.6 41.2  Platelets 150 - 440 K/uL 217 237 212   CMP Latest Ref Rng & Units 03/28/2016 03/28/2016 03/27/2016  Glucose 65 - 99 mg/dL 253(G130(H) 644(I196(H) 347(Q303(H)  BUN 6 - 20 mg/dL 15 11 15   Creatinine 0.61 - 1.24 mg/dL 2.590.84 5.630.93 8.750.97  Sodium 135 - 145 mmol/L 135 134(L) 129(L)  Potassium 3.5 - 5.1 mmol/L 3.9 4.4 4.1  Chloride 101 - 111 mmol/L 106 104 98(L)  CO2 22 - 32 mmol/L 21(L) 27 24  Calcium 8.9 - 10.3 mg/dL 6.4(P8.5(L) 3.2(R8.5(L) 9.5  Total Protein 6.5 - 8.1 g/dL 6.4(L) - 7.8  Total Bilirubin 0.3 - 1.2 mg/dL 0.5 - 0.7  Alkaline Phos 38 - 126 U/L 100 - 61  AST 15 - 41 U/L 26 - 15  ALT 17 - 63 U/L 25 - 11(L)   A/p: 50 year old male with acute cholecystitis  Continue current pain regiment, appreciate hospitalist and psych management of chronic  mental health issues  Continue the Zosyn for now, will start clear liquids, patient is very anxious about not wanting surgery and as his pain has resolved his white count is coming down a recheck them in the a.m. and if they're normalizing and he does well with that diet he likely can go home on antibiotics as opposed to surgical treatment.

## 2016-03-29 LAB — GLUCOSE, CAPILLARY
Glucose-Capillary: 133 mg/dL — ABNORMAL HIGH (ref 65–99)
Glucose-Capillary: 161 mg/dL — ABNORMAL HIGH (ref 65–99)
Glucose-Capillary: 187 mg/dL — ABNORMAL HIGH (ref 65–99)
Glucose-Capillary: 158 mg/dL — ABNORMAL HIGH (ref 65–99)
Glucose-Capillary: 153 mg/dL — ABNORMAL HIGH (ref 65–99)

## 2016-03-29 LAB — CBC
HCT: 37.3 % — ABNORMAL LOW (ref 40.0–52.0)
Hemoglobin: 12.5 g/dL — ABNORMAL LOW (ref 13.0–18.0)
MCH: 28.6 pg (ref 26.0–34.0)
MCHC: 33.6 g/dL (ref 32.0–36.0)
MCV: 85.3 fL (ref 80.0–100.0)
Platelets: 236 10*3/uL (ref 150–440)
RBC: 4.38 MIL/uL — ABNORMAL LOW (ref 4.40–5.90)
RDW: 13.5 % (ref 11.5–14.5)
WBC: 13.6 10*3/uL — ABNORMAL HIGH (ref 3.8–10.6)

## 2016-03-29 MED ORDER — METOPROLOL TARTRATE 5 MG/5ML IV SOLN
5.0000 mg | Freq: Three times a day (TID) | INTRAVENOUS | Status: DC | PRN
  Administered 2016-03-30: 5 mg via INTRAVENOUS
  Filled 2016-03-29: qty 5

## 2016-03-29 MED ORDER — INSULIN GLARGINE 100 UNIT/ML ~~LOC~~ SOLN
10.0000 [IU] | Freq: Every day | SUBCUTANEOUS | Status: DC
  Administered 2016-03-29 – 2016-04-01 (×3): 10 [IU] via SUBCUTANEOUS
  Filled 2016-03-29 (×5): qty 0.1

## 2016-03-29 MED ORDER — METOPROLOL SUCCINATE ER 50 MG PO TB24
50.0000 mg | ORAL_TABLET | Freq: Every day | ORAL | Status: DC
  Administered 2016-03-29 – 2016-04-01 (×4): 50 mg via ORAL
  Filled 2016-03-29 (×4): qty 1

## 2016-03-29 NOTE — Progress Notes (Signed)
[facility] - [place] at [facility]   PATIENT NAME: Greg [name]    MR#:  [id number]  DATE OF BIRTH:  Nov 02, 1965  SUBJECTIVE:  CHIEF COMPLAINT:  Patient is  resting comfortably, Had a large bowel movement today  More alert and requesting for surgery today REVIEW OF SYSTEMS:  CONSTITUTIONAL: No fever, fatigue or weakness.  EYES: No blurred or double vision.  EARS, NOSE, AND THROAT: No tinnitus or ear pain.  RESPIRATORY: No cough, shortness of breath, wheezing or hemoptysis.  CARDIOVASCULAR: No chest pain, orthopnea, edema.  GASTROINTESTINAL: No nausea, vomiting, diarrhea or abdominal pain.  GENITOURINARY: No dysuria, hematuria.  ENDOCRINE: No polyuria, nocturia,  HEMATOLOGY: No anemia, easy bruising or bleeding SKIN: No rash or lesion. MUSCULOSKELETAL: No joint pain or arthritis.   NEUROLOGIC: No tingling, numbness, weakness.  PSYCHIATRY: No anxiety or depression.   DRUG ALLERGIES:   Allergies  Allergen Reactions  . Haldol [Haloperidol] Anaphylaxis  . Risperdal [Risperidone] Other (See Comments)    Reaction: unknown   . Seroquel [Quetiapine Fumarate] Other (See Comments)    Reaction: unknown     VITALS:  Blood pressure (!) 163/100, pulse (!) 101, temperature 97.8 F (36.6 C), temperature source Oral, resp. rate 20, height 5\' 10"  (1.778 m), weight 113.9 kg (251 lb), SpO2 99 %.  PHYSICAL EXAMINATION:  GENERAL:  50 y.o.-year-old patient lying in the bed with no acute distress.  EYES: Pupils equal, round, reactive to light and accommodation. No scleral icterus. Extraocular muscles intact.  HEENT: Head atraumatic, normocephalic. Oropharynx and nasopharynx clear.  NECK:  Supple, no jugular venous distention. No thyroid enlargement, no tenderness.  LUNGS: Normal breath sounds bilaterally, no wheezing, rales,rhonchi or crepitation. No use of accessory muscles of respiration.  CARDIOVASCULAR: S1, S2 normal. No murmurs, rubs, or gallops.  ABDOMEN:  Soft, Minimal right-sided tenderness, no rebound tenderness. Bowel sounds present. No organomegaly or mass.  EXTREMITIES: No pedal edema, cyanosis, or clubbing.  NEUROLOGIC: Cranial nerves II through XII are intact. Muscle strength 5/5 in all extremities. Sensation intact. Gait not checked.  PSYCHIATRIC: The patient is alert and oriented x 3.  SKIN: No obvious rash, lesion, or ulcer.    LABORATORY PANEL:   CBC  Recent Labs Lab 03/29/16 0434  WBC 13.6*  HGB 12.5*  HCT 37.3*  PLT 236   ------------------------------------------------------------------------------------------------------------------  Chemistries   Recent Labs Lab 03/28/16 1637  NA 135  K 3.9  CL 106  CO2 21*  GLUCOSE 130*  BUN 15  CREATININE 0.84  CALCIUM 8.5*  AST 26  ALT 25  ALKPHOS 100  BILITOT 0.5   ------------------------------------------------------------------------------------------------------------------  Cardiac Enzymes No results for input(s): TROPONINI in the last 168 hours. ------------------------------------------------------------------------------------------------------------------  RADIOLOGY:  No results found.  EKG:   Orders placed or performed during the hospital encounter of 08/27/15  . ED EKG  . ED EKG  . EKG 12-Lead  . EKG 12-Lead    ASSESSMENT AND PLAN:   * Acute cholecystitisClinically improving  Continue IV Zosyn. Further management as per Dr. Orvis Brill of surgery.patient is requesting surgery today he change his mind will notify surgery   * Hypertension Patient is currentlfull liquid diet Lopressori V 5 mg every 8 hours prn. Hydralazine when necessary. Restart home medication Toprol-XL 50 mg by mouth once daily   * Diabetes mellitus type 2 Hold metformin   started him on 10 units of Lantus for basal coverage  on sliding scale insulin. Added Accu-Cheks every 4 hours.  * Bipolar disorder Continue Depakote  IV, changed to by mouth once patient is able  to take by mouth Psychiatry was consulted in view of psychosis .follow-up with psychiatry   * DVT prophylaxis SCDs added. Lovenox to be started by primary team as per his surgery scheduled.  All the records are reviewed and case discussed with RN Management plans discussed with the patient, family and they are in agreement.     All the records are reviewed and case discussed with Care Management/Social Workerr. Management plans discussed with the patient, family and they are in agreement.  CODE STATUS: fc  TOTAL TIME TAKING CARE OF THIS PATIENT: 36 minutes.     Note: This dictation was prepared with Dragon dictation along with smaller phrase technology. Any transcriptional errors that result from this process are unintentional.   Ramonita LabGouru, Shadiyah Wernli M.D on 03/29/2016 at 2:03 PM  Between 7am to 6pm - Pager - [phone] After 6pm go to [email or web address] - password [email or web address] [facility]  [facility]  Office  [phone]  CC: Primary care physician; Evelene CroonNIEMEYER, MEINDERT, MD

## 2016-03-29 NOTE — Progress Notes (Signed)
Inpatient Diabetes Program Recommendations  AACE/ADA: New Consensus Statement on Inpatient Glycemic Control (2015)  Target Ranges:  Prepandial:   less than 140 mg/dL      Peak postprandial:   less than 180 mg/dL (1-2 hours)      Critically ill patients:  140 - 180 mg/dL   Lab Results  Component Value Date   GLUCAP 161 (H) 03/29/2016   HGBA1C 12.2 (H) 03/27/2016    Review of Glycemic Control  Results for Greg [name], Greg [name] (MRN [id number]) as of 03/29/2016 11:36  Ref. Range 03/28/2016 16:05 03/28/2016 20:02 03/28/2016 23:58 03/29/2016 04:57 03/29/2016 07:53  Glucose-Capillary Latest Ref Range: 65 - 99 mg/dL 578127 (H) 469217 (H) 629199 (H) 133 (H) 161 (H)    Diabetes history: Diabetes Outpatient Diabetes medications: Metformin 1000 mg bid Current orders for Inpatient glycemic control:  Novolog moderate q 4 hours  Inpatient Diabetes Program Recommendations:  May consider adding Lantus 11 units daily while patient is in the hospital and titrate.  Change Novolog correction insulin to tid and hs once the patient progresses to a full diet.   Based on current A1C, may want to discharge him home on additional diabetes medication-consider SGLT2 inhibitor such as Invokana or Jardiance; little risk of hypoglycemia and 1-2% reduction in A1C without weight gain.    Susette RacerJulie Parth Mccormac, RN, BA, MHA, CDE Diabetes Coordinator Inpatient Diabetes Program  [phone] (Team Pager) [phone] [facility] Office) 03/29/2016 11:43 AM

## 2016-03-29 NOTE — Progress Notes (Signed)
50 year old male who was admitted for acute cholecystitis with some medical issues to include bipolar disorder.  He states doing well today, the pain in his back has subsided.  He took in clear liquids without nausea.  He has had time to think about surgery and would like to have his gallbladder removed.    Vitals:   03/29/16 0505 03/29/16 1440  BP: (!) 163/100 (!) 147/74  Pulse: (!) 101 89  Resp:  20  Temp: 97.8 F (36.6 C)    I/O last 3 completed shifts: In: 2605.4 [P.O.:60; I.V.:2306.3; IV Piggyback:239.1] Out: 1195 [Urine:1195] Total I/O In: 2150 [P.O.:960; I.V.:1090; IV Piggyback:100] Out: 775 [Urine:775]   PE:  Gen: NAD, AAOx3, good judgement GI: soft, non tender, no right flank or CVA tenderness today RUE: some edema surrounding the iv site  CBC Latest Ref Rng & Units 03/29/2016 03/28/2016 03/27/2016  WBC 3.8 - 10.6 K/uL 13.6(H) 15.0(H) 18.0(H)  Hemoglobin 13.0 - 18.0 g/dL 12.5(L) 13.1 15.1  Hematocrit 40.0 - 52.0 % 37.3(L) 38.8(L) 44.6  Platelets 150 - 440 K/uL 236 217 237   CMP Latest Ref Rng & Units 03/28/2016 03/28/2016 03/27/2016  Glucose 65 - 99 mg/dL 147(W130(H) 295(A196(H) 213(Y303(H)  BUN 6 - 20 mg/dL 15 11 15   Creatinine 0.61 - 1.24 mg/dL 8.650.84 7.840.93 6.960.97  Sodium 135 - 145 mmol/L 135 134(L) 129(L)  Potassium 3.5 - 5.1 mmol/L 3.9 4.4 4.1  Chloride 101 - 111 mmol/L 106 104 98(L)  CO2 22 - 32 mmol/L 21(L) 27 24  Calcium 8.9 - 10.3 mg/dL 2.9(B8.5(L) 2.8(U8.5(L) 9.5  Total Protein 6.5 - 8.1 g/dL 6.4(L) - 7.8  Total Bilirubin 0.3 - 1.2 mg/dL 0.5 - 0.7  Alkaline Phos 38 - 126 U/L 100 - 61  AST 15 - 41 U/L 26 - 15  ALT 17 - 63 U/L 25 - 11(L)   A/p: 50 year old male with acute cholecystitis  Continue current pain regiment, appreciate hospitalist and psych management of chronic mental health issues  Continue the Zosyn, The risks, benefits, complications, treatment options, and expected outcomes were discussed with the patient. The possibilities of bleeding, recurrent infection, finding a  normal gallbladder, perforation of viscus organs, damage to surrounding structures, bile leak, abscess formation, needing a drain placed, the need for additional procedures, reaction to medication, pulmonary aspiration,  failure to diagnose a condition, the possible need to convert to an open procedure, and creating a complication requiring transfusion or operation were discussed with the patient. The patient concurred with the proposed plan, giving informed consent. I scheduled him for tomorrow Lap chole.

## 2016-03-30 ENCOUNTER — Encounter
Admission: EM | Disposition: A | Discharge status: Home / Self Care | Payer: Self-pay | Source: Home / Self Care | Attending: Internal Medicine

## 2016-03-30 ENCOUNTER — Inpatient Hospital Stay: Payer: Medicare Other | Admitting: Anesthesiology

## 2016-03-30 ENCOUNTER — Encounter: Payer: Self-pay | Admitting: *Deleted

## 2016-03-30 HISTORY — PX: CHOLECYSTECTOMY: SHX55

## 2016-03-30 LAB — GLUCOSE, CAPILLARY
Glucose-Capillary: 107 mg/dL — ABNORMAL HIGH (ref 65–99)
Glucose-Capillary: 109 mg/dL — ABNORMAL HIGH (ref 65–99)
Glucose-Capillary: 116 mg/dL — ABNORMAL HIGH (ref 65–99)
Glucose-Capillary: 118 mg/dL — ABNORMAL HIGH (ref 65–99)
Glucose-Capillary: 256 mg/dL — ABNORMAL HIGH (ref 65–99)
Glucose-Capillary: 91 mg/dL (ref 65–99)

## 2016-03-30 LAB — MRSA PCR SCREENING: MRSA by PCR: NEGATIVE

## 2016-03-30 SURGERY — LAPAROSCOPIC CHOLECYSTECTOMY
Anesthesia: General | Wound class: Clean Contaminated

## 2016-03-30 MED ORDER — ROCURONIUM BROMIDE 100 MG/10ML IV SOLN
INTRAVENOUS | Status: DC | PRN
  Administered 2016-03-30: 50 mg via INTRAVENOUS

## 2016-03-30 MED ORDER — MIDAZOLAM HCL 2 MG/2ML IJ SOLN
INTRAMUSCULAR | Status: DC | PRN
  Administered 2016-03-30: 2 mg via INTRAVENOUS

## 2016-03-30 MED ORDER — ONDANSETRON HCL 4 MG/2ML IJ SOLN
INTRAMUSCULAR | Status: DC | PRN
  Administered 2016-03-30: 4 mg via INTRAVENOUS

## 2016-03-30 MED ORDER — HYDROMORPHONE HCL 1 MG/ML IJ SOLN
0.2500 mg | INTRAMUSCULAR | Status: DC | PRN
Start: 2016-03-30 — End: 2016-03-30
  Administered 2016-03-30 (×2): 0.5 mg via INTRAVENOUS

## 2016-03-30 MED ORDER — MEPERIDINE HCL 25 MG/ML IJ SOLN: 6.2500 mg | INTRAMUSCULAR | Status: DC | PRN

## 2016-03-30 MED ORDER — ACETAMINOPHEN 10 MG/ML IV SOLN
INTRAVENOUS | Status: DC | PRN
  Administered 2016-03-30: 1000 mg via INTRAVENOUS

## 2016-03-30 MED ORDER — FENTANYL CITRATE (PF) 100 MCG/2ML IJ SOLN
25.0000 ug | INTRAMUSCULAR | Status: DC | PRN
  Administered 2016-03-30 (×3): 50 ug via INTRAVENOUS

## 2016-03-30 MED ORDER — LIDOCAINE HCL (CARDIAC) 20 MG/ML IV SOLN
INTRAVENOUS | Status: DC | PRN
  Administered 2016-03-30: 100 mg via INTRAVENOUS

## 2016-03-30 MED ORDER — SUCCINYLCHOLINE CHLORIDE 20 MG/ML IJ SOLN
INTRAMUSCULAR | Status: DC | PRN
  Administered 2016-03-30: 120 mg via INTRAVENOUS

## 2016-03-30 MED ORDER — FENTANYL CITRATE (PF) 100 MCG/2ML IJ SOLN
INTRAMUSCULAR | Status: DC | PRN
  Administered 2016-03-30 (×3): 50 ug via INTRAVENOUS

## 2016-03-30 MED ORDER — BUPIVACAINE HCL (PF) 0.5 % IJ SOLN
INTRAMUSCULAR | Status: DC | PRN
  Administered 2016-03-30: 30 mL

## 2016-03-30 MED ORDER — SUGAMMADEX SODIUM 500 MG/5ML IV SOLN
INTRAVENOUS | Status: DC | PRN
  Administered 2016-03-30: 300 mg via INTRAVENOUS

## 2016-03-30 MED ORDER — PROPOFOL 10 MG/ML IV BOLUS
INTRAVENOUS | Status: DC | PRN
  Administered 2016-03-30: 250 mg via INTRAVENOUS

## 2016-03-30 MED ORDER — BUPIVACAINE HCL (PF) 0.5 % IJ SOLN
INTRAMUSCULAR | Status: AC
  Filled 2016-03-30: qty 30

## 2016-03-30 MED ORDER — FENTANYL CITRATE (PF) 100 MCG/2ML IJ SOLN
INTRAMUSCULAR | Status: AC
  Filled 2016-03-30: qty 2

## 2016-03-30 MED ORDER — BUPIVACAINE-EPINEPHRINE (PF) 0.5% -1:200000 IJ SOLN
INTRAMUSCULAR | Status: AC
  Filled 2016-03-30: qty 30

## 2016-03-30 MED ORDER — ACETAMINOPHEN 10 MG/ML IV SOLN
INTRAVENOUS | Status: AC
  Filled 2016-03-30: qty 100

## 2016-03-30 MED ORDER — HYDROMORPHONE HCL 1 MG/ML IJ SOLN
INTRAMUSCULAR | Status: AC
  Filled 2016-03-30: qty 1

## 2016-03-30 MED ORDER — SODIUM CHLORIDE 0.9 % IR SOLN
Status: DC | PRN
  Administered 2016-03-30: 750 mL

## 2016-03-30 MED ORDER — OXYCODONE HCL 5 MG/5ML PO SOLN: 5.0000 mg | Freq: Once | ORAL | Status: DC | PRN

## 2016-03-30 MED ORDER — SODIUM CHLORIDE 0.9 % IV SOLN
INTRAVENOUS | Status: DC
  Administered 2016-03-30 – 2016-03-31 (×3): via INTRAVENOUS

## 2016-03-30 MED ORDER — OXYCODONE HCL 5 MG PO TABS: 5.0000 mg | ORAL_TABLET | Freq: Once | ORAL | Status: DC | PRN

## 2016-03-30 SURGICAL SUPPLY — 50 items
APPLIER CLIP 5 13 M/L LIGAMAX5 (MISCELLANEOUS) ×2
APR CLP MED LRG 5 ANG JAW (MISCELLANEOUS) ×1
BLADE CLIPPER SURG (BLADE) ×2 IMPLANT
BLADE SURG 15 STRL LF DISP TIS (BLADE) IMPLANT
BLADE SURG 15 STRL SS (BLADE)
BULB RESERV EVAC DRAIN JP 100C (MISCELLANEOUS) ×2 IMPLANT
CANISTER SUCT 1200ML W/VALVE (MISCELLANEOUS) ×2 IMPLANT
CATH CHOLANGI 4FR 420404F (CATHETERS) IMPLANT
CHLORAPREP W/TINT 26ML (MISCELLANEOUS) ×2 IMPLANT
CLIP APPLIE 5 13 M/L LIGAMAX5 (MISCELLANEOUS) ×1 IMPLANT
CONRAY 60ML FOR OR (MISCELLANEOUS) IMPLANT
DEFOGGER SCOPE WARMER CLEARIFY (MISCELLANEOUS) ×2 IMPLANT
DRAIN CHANNEL JP 19F (MISCELLANEOUS) ×2 IMPLANT
DRAPE SHEET LG 3/4 BI-LAMINATE (DRAPES) IMPLANT
ELECT CAUTERY BLADE 6.4 (BLADE) ×2 IMPLANT
ELECT E-Z MONOPOLAR 33 (MISCELLANEOUS) ×2
ELECT REM PT RETURN 9FT ADLT (ELECTROSURGICAL) ×2
ELECTRODE E-Z MONOPOLAR 33 (MISCELLANEOUS) ×1 IMPLANT
ELECTRODE REM PT RTRN 9FT ADLT (ELECTROSURGICAL) ×1 IMPLANT
ENDOPOUCH RETRIEVER 10 (MISCELLANEOUS) ×2 IMPLANT
GLOVE BIO SURGEON STRL SZ 6.5 (GLOVE) ×6 IMPLANT
GLOVE BIOGEL PI IND STRL 7.0 (GLOVE) ×2 IMPLANT
GLOVE BIOGEL PI INDICATOR 7.0 (GLOVE) ×2
GLOVE PI ORTHOPRO 6.5 (GLOVE) ×1
GLOVE PI ORTHOPRO STRL 6.5 (GLOVE) ×1 IMPLANT
GOWN STRL REUS W/ TWL LRG LVL3 (GOWN DISPOSABLE) ×3 IMPLANT
GOWN STRL REUS W/TWL LRG LVL3 (GOWN DISPOSABLE) ×6
IRRIGATION STRYKERFLOW (MISCELLANEOUS) ×1 IMPLANT
IRRIGATOR STRYKERFLOW (MISCELLANEOUS) ×2
IV CATH ANGIO 12GX3 LT BLUE (NEEDLE) IMPLANT
IV NS 1000ML (IV SOLUTION) ×2
IV NS 1000ML BAXH (IV SOLUTION) ×1 IMPLANT
LABEL OR SOLS (LABEL) ×2 IMPLANT
LIQUID BAND (GAUZE/BANDAGES/DRESSINGS) ×2 IMPLANT
NEEDLE HYPO 25X1 1.5 SAFETY (NEEDLE) ×2 IMPLANT
NS IRRIG 500ML POUR BTL (IV SOLUTION) ×2 IMPLANT
PACK LAP CHOLECYSTECTOMY (MISCELLANEOUS) ×2 IMPLANT
PENCIL ELECTRO HAND CTR (MISCELLANEOUS) ×2 IMPLANT
SCISSORS METZENBAUM CVD 33 (INSTRUMENTS) ×2 IMPLANT
SLEEVE ENDOPATH XCEL 5M (ENDOMECHANICALS) ×4 IMPLANT
SPONGE DRAIN TRACH 4X4 STRL 2S (GAUZE/BANDAGES/DRESSINGS) ×2 IMPLANT
STRAP SAFETY BODY (MISCELLANEOUS) ×2 IMPLANT
SUT ETHILON 3-0 (SUTURE) ×2 IMPLANT
SUT MNCRL 4-0 (SUTURE) ×2
SUT MNCRL 4-0 27XMFL (SUTURE) ×1
SUT VICRYL 0 AB UR-6 (SUTURE) ×6 IMPLANT
SUTURE MNCRL 4-0 27XMF (SUTURE) ×1 IMPLANT
TROCAR XCEL BLUNT TIP 100MML (ENDOMECHANICALS) ×2 IMPLANT
TROCAR XCEL NON-BLD 5MMX100MML (ENDOMECHANICALS) ×2 IMPLANT
TUBING INSUFFLATOR HI FLOW (MISCELLANEOUS) ×2 IMPLANT

## 2016-03-30 NOTE — Anesthesia Procedure Notes (Signed)
Procedure Name: Intubation Date/Time: 03/30/2016 2:29 PM Performed by: Irving BurtonBACHICH, Koula Venier Pre-anesthesia Checklist: Emergency Drugs available, Patient identified, Suction available and Patient being monitored Patient Re-evaluated:Patient Re-evaluated prior to inductionOxygen Delivery Method: Circle system utilized Preoxygenation: Pre-oxygenation with 100% oxygen Intubation Type: IV induction Ventilation: Mask ventilation without difficulty Laryngoscope Size: Mac and 4 Grade View: Grade II Tube type: Oral Tube size: 7.5 mm Number of attempts: 1 Airway Equipment and Method: Patient positioned with wedge pillow and Stylet Placement Confirmation: ETT inserted through vocal cords under direct vision,  positive ETCO2 and breath sounds checked- equal and bilateral Secured at: 22 cm Dental Injury: Teeth and Oropharynx as per pre-operative assessment

## 2016-03-30 NOTE — Care Management Important Message (Signed)
Important Message  Patient Details  Name: Dairl PonderRichard Hack MRN: [id number] Date of Birth: 09/10/1965   Medicare Important Message Given:  Yes    Chapman FitchBOWEN, Lynnita Somma T, RN 03/30/2016, 12:50 PM

## 2016-03-30 NOTE — Transfer of Care (Signed)
Immediate Anesthesia Transfer of Care Note  Patient: Greg [name]  Procedure(s) Performed: Procedure(s): LAPAROSCOPIC CHOLECYSTECTOMY (N/A)  Patient Location: PACU  Anesthesia Type:General  Level of Consciousness: sedated  Airway & Oxygen Therapy: Patient connected to face mask oxygen  Post-op Assessment: Post -op Vital signs reviewed and stable  Post vital signs: stable  Last Vitals:  Vitals:   03/30/16 1322 03/30/16 1636  BP: (!) 166/95 113/70  Pulse: 86   Resp: 18   Temp: 37.3 C 36.6 C    Last Pain:  Vitals:   03/30/16 1322  TempSrc: Tympanic  PainSc: 0-No pain      Patients Stated Pain Goal: 0 (03/27/16 [id number])  Complications: No apparent anesthesia complications

## 2016-03-30 NOTE — Op Note (Signed)
Laparoscopic Cholecystectomy Procedure Note  Indications: This patient presents with symptomatic gallbladder disease and will undergo laparoscopic cholecystectomy.  Pre-operative Diagnosis: Calculus of gallbladder with acute cholecystitis, without mention of obstruction  Post-operative Diagnosis: Same  Surgeon: Gladis Riffleatherine L Lynnita Somma   Assistants: none  Anesthesia: General endotracheal anesthesia  ASA Class: 2  Procedure Details  The patient was seen again in the Holding Room. The risks, benefits, complications, treatment options, and expected outcomes were discussed with the patient. The possibilities of reaction to medication, pulmonary aspiration, perforation of viscus, bleeding, recurrent infection, finding a normal gallbladder, the need for additional procedures, failure to diagnose a condition, the possible need to convert to an open procedure, and creating a complication requiring transfusion or operation were discussed with the patient. The patient and/or family concurred with the proposed plan, giving informed consent. The site of surgery properly noted/marked. The patient was taken to Operating Room, identified as Greg [name] and the procedure verified as Laparoscopic Cholecystectomy with Intraoperative Cholangiograms. A Time Out was held and the above information confirmed.  Prior to the induction of general anesthesia, antibiotic prophylaxis was administered. General endotracheal anesthesia was then administered and tolerated well. After the induction, the abdomen was prepped in the usual sterile fashion. The patient was positioned in the supine position with the left arm comfortably tucked, along with some reverse Trendelenburg.  Local anesthetic agent was injected into the skin near the umbilicus and an incision made. The midline fascia was incised and the Hasson technique was used to introduce a 10 mm port under direct vision. It was secured with a figure of eight Vicryl suture  placed in the usual fashion. Pneumoperitoneum was then created with CO2 and tolerated well without any adverse changes in the patient's vital signs. Additional trocars were introduced under direct vision. All skin incisions were infiltrated with a local anesthetic agent before making the incision and placing the trocars.   The gallbladder was identified, the fundus grasped and retracted cephalad. Adhesions were lysed bluntly and with the electrocautery where indicated, taking care not to injure any adjacent organs or viscus. The infundibulum was grasped and retracted laterally, exposing the peritoneum overlying the triangle of Calot. This was then divided and exposed in a blunt fashion. The cystic duct was clearly identified and bluntly dissected circumferentially. The junctions of the gallbladder, cystic duct and common bile duct were clearly identified prior to the division of any linear structure.  The cystic duct was then doubly ligated with surgical clips on the patient side and singly clipped on the gallbladder side and divided. The cystic artery was identified, dissected free, ligated with clips and divided as well.   The gallbladder was dissected from the liver bed in retrograde fashion with the electrocautery. The gallbladder was slightly intrahepatic and had a gangrenous portion in the upper posterior part starting the abscess into the liver cavity. The gallbladder was removed. The liver bed was irrigated and inspected. Hemostasis was achieved with the electrocautery. Copious irrigation was utilized and was repeatedly aspirated until clear all particulate matter. Due to the gangrenous nature of the gallbladder JP drain was then placed in the right upper quadrant coming out of the most lateral 5 mm port site.  Pneumoperitoneum was completely reduced after viewing removal of the trocars under direct vision. The wound was thoroughly irrigated and the fascia was then closed with a figure of eight  suture; the skin was then closed with 4-0 Monocryl and a sterile glue was applied.  Instrument, sponge, and needle  counts were correct at closure and at the conclusion of the case.   Findings: Cholecystitis with Cholelithiasis  Estimated Blood Loss: 100         Drains: JP drain in RUQ         Total IV Fluids:         Specimens: Gallbladder           Complications: None; patient tolerated the procedure well.         Disposition: PACU - hemodynamically stable.         Condition: stable

## 2016-03-30 NOTE — Progress Notes (Signed)
Patient ID: Greg [name], male   DOB: 11/07/1965, 50 y.o.   MRN: [id number]  [facility] [employer] PROGRESS NOTE  Greg PonderRichard Andujar BJY:[id number] DOB: 11/18/1965 DOA: 03/27/2016 PCP: Evelene CroonNIEMEYER, MEINDERT, MD  HPI/Subjective: Patient feeling fine. Offers no complaints of nausea vomiting or abdominal pain.  Objective: Vitals:   03/29/16 2113 03/30/16 0408  BP: 137/80 (!) 150/91  Pulse: 78 87  Resp: 20 20  Temp: 98 F (36.7 C) 98.7 F (37.1 C)    Filed Weights   03/27/16 0330 03/27/16 2215  Weight: 113.4 kg (250 lb) 113.9 kg (251 lb)    ROS: Review of Systems  Constitutional: Negative for chills and fever.  Eyes: Negative for blurred vision.  Respiratory: Negative for cough and shortness of breath.   Cardiovascular: Negative for chest pain.  Gastrointestinal: Negative for abdominal pain, constipation, diarrhea, nausea and vomiting.  Genitourinary: Negative for dysuria.  Musculoskeletal: Negative for joint pain.  Neurological: Negative for dizziness and headaches.   Exam: Physical Exam  Constitutional: He is oriented to person, place, and time.  HENT:  Nose: No mucosal edema.  Mouth/Throat: No oropharyngeal exudate or posterior oropharyngeal edema.  Eyes: Conjunctivae, EOM and lids are normal. Pupils are equal, round, and reactive to light.  Neck: No JVD present. Carotid bruit is not present. No edema present. No thyroid mass and no thyromegaly present.  Cardiovascular: S1 normal and S2 normal.  Exam reveals no gallop.   No murmur heard. Pulses:      Dorsalis pedis pulses are 2+ on the right side, and 2+ on the left side.  Respiratory: No respiratory distress. He has no wheezes. He has no rhonchi. He has no rales.  GI: Soft. Bowel sounds are normal. There is no tenderness.  Musculoskeletal:       Right ankle: He exhibits no swelling.       Left ankle: He exhibits no swelling.  Lymphadenopathy:    He has no cervical adenopathy.  Neurological: He is alert and oriented to  person, place, and time. No cranial nerve deficit.  Skin: Skin is warm. No rash noted. Nails show no clubbing.  Psychiatric: He has a normal mood and affect.      Data Reviewed: Basic Metabolic Panel:  Recent Labs Lab 03/27/16 0334 03/28/16 0525 03/28/16 1637  NA 129* 134* 135  K 4.1 4.4 3.9  CL 98* 104 106  CO2 24 27 21*  GLUCOSE 303* 196* 130*  BUN 15 11 15   CREATININE 0.97 0.93 0.84  CALCIUM 9.5 8.5* 8.5*   Liver Function Tests:  Recent Labs Lab 03/27/16 0334 03/28/16 1637  AST 15 26  ALT 11* 25  ALKPHOS 61 100  BILITOT 0.7 0.5  PROT 7.8 6.4*  ALBUMIN 3.7 2.9*    Recent Labs Lab 03/27/16 0334  LIPASE 19   CBC:  Recent Labs Lab 03/27/16 0334 03/28/16 0525 03/29/16 0434  WBC 18.0* 15.0* 13.6*  HGB 15.1 13.1 12.5*  HCT 44.6 38.8* 37.3*  MCV 84.7 85.7 85.3  PLT 237 217 236    CBG:  Recent Labs Lab 03/29/16 1627 03/29/16 2112 03/30/16 0034 03/30/16 0410 03/30/16 0748  GLUCAP 158* 153* 91 109* 107*    Recent Results (from the past 240 hour(s))  Urine culture     Status: None   Collection Time: 03/27/16  3:34 AM  Result Value Ref Range Status   Specimen Description URINE, CLEAN CATCH  Final   Special Requests Normal  Final   Culture NO GROWTH [facility]   Final   Report Status 03/28/2016 FINAL  Final      Scheduled Meds: . acetaminophen  1,000 mg Oral Q6H  . insulin aspart  0-15 Units Subcutaneous Q4H  . insulin glargine  10 Units Subcutaneous Daily  . ketorolac  30 mg Intravenous Q6H  . lithium carbonate  450 mg Oral Q12H  . metoprolol succinate  50 mg Oral Daily  . nicotine  21 mg Transdermal Daily  . piperacillin-tazobactam (ZOSYN)  IV  3.375 g Intravenous Q8H  . valproate sodium  500 mg Intravenous Q12H   Continuous Infusions: . sodium chloride 100 mL/hr at 03/30/16 0416    Assessment/Plan:  1. Preoperative examination. No contraindications to surgery at This time. 2. Acute cholecystitis.  Symptomatically improved with IV Zosyn. 4 operating room today. 3. Essential hypertension on metoprolol 4. Bipolar disorder on Depakote and lithium 5. Type 2 diabetes mellitus on low-dose Lantus here. I asked the nurse not to give any  Coverage this morning. I may have to start D5 drip or give if sugars stay low.  Code Status:     Code Status Orders        Start     Ordered   03/27/16 2232  Full code  Continuous     03/27/16 2232    Code Status History    Date Active Date Inactive Code Status Order ID Comments User Context   10/06/2015 10:15 PM 10/12/2015  6:14 PM Full Code [id number]  Audery Amel, MD Inpatient   04/11/2015  2:15 PM 04/18/2015  4:59 PM Full Code [id number]  Beau Fanny, MD Inpatient   02/07/2015  9:14 PM 02/11/2015  7:03 PM Full Code [id number]  Kerry Hough, PA-C Inpatient   02/06/2015 10:16 PM 02/07/2015  9:14 PM Full Code [id number]  Toy Cookey, MD ED     Disposition Plan: if all goes well with surgery potenti  Consultants:  Gen. surgery  Antibiotics:  Zosyn  Time spent: 25 minutes  Alford Highland  [employer]

## 2016-03-30 NOTE — Anesthesia Preprocedure Evaluation (Signed)
Anesthesia Evaluation  Patient identified by MRN, date of birth, ID band Patient awake    Reviewed: Allergy & Precautions, NPO status , Patient's Chart, lab work & pertinent test results  History of Anesthesia Complications Negative for: history of anesthetic complications  Airway Mallampati: II  TM Distance: >3 FB Neck ROM: Full    Dental  (+) Poor Dentition   Pulmonary neg sleep apnea, neg COPD, Current Smoker,    breath sounds clear to auscultation- rhonchi (-) wheezing      Cardiovascular Exercise Tolerance: Good hypertension, (-) CAD and (-) Past MI  Rhythm:Regular Rate:Normal - Systolic murmurs and - Diastolic murmurs    Neuro/Psych Anxiety Bipolar Disorder negative neurological ROS     GI/Hepatic negative GI ROS, Neg liver ROS,   Endo/Other  diabetes, Type 2, Oral Hypoglycemic Agents  Renal/GU negative Renal ROS     Musculoskeletal negative musculoskeletal ROS (+)   Abdominal (+) + obese,   Peds  Hematology negative hematology ROS (+)   Anesthesia Other Findings Past Medical History: No date: Bipolar 1 disorder (HCC) No date: Diabetes (HCC) No date: Dyslipidemia No date: HTN (hypertension) No date: Obesity No date: Paranoia (HCC) No date: Schizo affective schizophrenia (HCC)   Reproductive/Obstetrics                             Anesthesia Physical Anesthesia Plan  ASA: II  Anesthesia Plan: General   Post-op Pain Management:    Induction: Intravenous  Airway Management Planned: Oral ETT  Additional Equipment:   Intra-op Plan:   Post-operative Plan: Extubation in OR  Informed Consent: I have reviewed the patients History and Physical, chart, labs and discussed the procedure including the risks, benefits and alternatives for the proposed anesthesia with the patient or authorized representative who has indicated his/her understanding and acceptance.   Dental  advisory given  Plan Discussed with: CRNA and Anesthesiologist  Anesthesia Plan Comments:         Anesthesia Quick Evaluation

## 2016-03-30 NOTE — Interval H&P Note (Signed)
History and Physical Interval Note:  03/30/2016 2:10 PM  Greg [name]  has presented today for surgery, with the diagnosis of n/a  The various methods of treatment have been discussed with the patient and family. After consideration of risks, benefits and other options for treatment, the patient has consented to  Procedure(s): LAPAROSCOPIC CHOLECYSTECTOMY (N/A) as a surgical intervention .  The patient's history has been reviewed, patient examined, no change in status, stable for surgery.  I have reviewed the patient's chart and labs.  Questions were answered to the patient's satisfaction.     Azaleah Usman L Hayes Rehfeldt

## 2016-03-31 LAB — GLUCOSE, CAPILLARY
Glucose-Capillary: 159 mg/dL — ABNORMAL HIGH (ref 65–99)
Glucose-Capillary: 169 mg/dL — ABNORMAL HIGH (ref 65–99)
Glucose-Capillary: 173 mg/dL — ABNORMAL HIGH (ref 65–99)
Glucose-Capillary: 177 mg/dL — ABNORMAL HIGH (ref 65–99)
Glucose-Capillary: 192 mg/dL — ABNORMAL HIGH (ref 65–99)
Glucose-Capillary: 210 mg/dL — ABNORMAL HIGH (ref 65–99)
Glucose-Capillary: 89 mg/dL (ref 65–99)

## 2016-03-31 MED ORDER — NICOTINE 21 MG/24HR TD PT24: 21.0000 mg | MEDICATED_PATCH | Freq: Every day | TRANSDERMAL | 0 refills | Status: DC

## 2016-03-31 MED ORDER — METOPROLOL SUCCINATE ER 50 MG PO TB24: 50.0000 mg | ORAL_TABLET | Freq: Every day | ORAL | 0 refills | Status: DC

## 2016-03-31 MED ORDER — INSULIN GLARGINE 100 UNIT/ML ~~LOC~~ SOLN: 10.0000 [IU] | Freq: Every day | SUBCUTANEOUS | 0 refills | Status: DC

## 2016-03-31 MED ORDER — OXYCODONE HCL 5 MG PO TABS: 5.0000 mg | ORAL_TABLET | Freq: Four times a day (QID) | ORAL | 0 refills | Status: DC | PRN

## 2016-03-31 MED ORDER — TEMAZEPAM 15 MG PO CAPS: 15.0000 mg | ORAL_CAPSULE | Freq: Every evening | ORAL | 0 refills | Status: DC | PRN

## 2016-03-31 MED ORDER — LIVING WELL WITH DIABETES BOOK
Freq: Once | Status: AC
  Administered 2016-03-31: 09:00:00
  Filled 2016-03-31: qty 1

## 2016-03-31 MED ORDER — AMOXICILLIN-POT CLAVULANATE 875-125 MG PO TABS: 1.0000 | ORAL_TABLET | Freq: Two times a day (BID) | ORAL | 0 refills | Status: DC

## 2016-03-31 NOTE — Progress Notes (Signed)
Patient ID: Greg [name], male   DOB: 06-17-1966, 50 y.o.   MRN: [id number]  [facility] PROGRESS NOTE  Greg [name] WUJ:[id number] DOB: 04/17/1966 DOA: 03/27/2016 PCP: Evelene Croon, MD  HPI/Subjective: Patient feeling well. Seen earlier today. Has some abdominal soreness.  Objective: Vitals:   03/31/16 1006 03/31/16 1320  BP: (!) 154/93 (!) 162/83  Pulse: 93 94  Resp:  16  Temp:  99.1 F (37.3 C)    Filed Weights   03/27/16 0330 03/27/16 2215 03/30/16 1322  Weight: 113.4 kg (250 lb) 113.9 kg (251 lb) 113.9 kg (251 lb)    ROS: Review of Systems  Constitutional: Negative for chills and fever.  Eyes: Negative for blurred vision.  Respiratory: Negative for cough and shortness of breath.   Cardiovascular: Negative for chest pain.  Gastrointestinal: Positive for abdominal pain. Negative for constipation, diarrhea, nausea and vomiting.  Genitourinary: Negative for dysuria.  Musculoskeletal: Negative for joint pain.  Neurological: Negative for dizziness and headaches.   Exam: Physical Exam  Constitutional: He is oriented to person, place, and time.  HENT:  Nose: No mucosal edema.  Mouth/Throat: No oropharyngeal exudate or posterior oropharyngeal edema.  Eyes: Conjunctivae, EOM and lids are normal. Pupils are equal, round, and reactive to light.  Neck: No JVD present. Carotid bruit is not present. No edema present. No thyroid mass and no thyromegaly present.  Cardiovascular: S1 normal and S2 normal.  Exam reveals no gallop.   No murmur heard. Pulses:      Dorsalis pedis pulses are 2+ on the right side, and 2+ on the left side.  Respiratory: No respiratory distress. He has no wheezes. He has no rhonchi. He has no rales.  GI: Soft. Bowel sounds are normal. There is tenderness in the right upper quadrant.  Musculoskeletal:       Right ankle: He exhibits no swelling.       Left ankle: He exhibits no swelling.  Lymphadenopathy:    He has no cervical adenopathy.   Neurological: He is alert and oriented to person, place, and time. No cranial nerve deficit.  Skin: Skin is warm. No rash noted. Nails show no clubbing.  Psychiatric: He has a normal mood and affect.      Data Reviewed: Basic Metabolic Panel:  Recent Labs Lab 03/27/16 0334 03/28/16 0525 03/28/16 1637  NA 129* 134* 135  K 4.1 4.4 3.9  CL 98* 104 106  CO2 24 27 21*  GLUCOSE 303* 196* 130*  BUN 15 11 15   CREATININE 0.97 0.93 0.84  CALCIUM 9.5 8.5* 8.5*   Liver Function Tests:  Recent Labs Lab 03/27/16 0334 03/28/16 1637  AST 15 26  ALT 11* 25  ALKPHOS 61 100  BILITOT 0.7 0.5  PROT 7.8 6.4*  ALBUMIN 3.7 2.9*    Recent Labs Lab 03/27/16 0334  LIPASE 19   CBC:  Recent Labs Lab 03/27/16 0334 03/28/16 0525 03/29/16 0434  WBC 18.0* 15.0* 13.6*  HGB 15.1 13.1 12.5*  HCT 44.6 38.8* 37.3*  MCV 84.7 85.7 85.3  PLT 237 217 236    CBG:  Recent Labs Lab 03/30/16 2020 03/30/16 2336 03/31/16 0459 03/31/16 0731 03/31/16 1141  GLUCAP 256* 210* 89 169* 192*    Recent Results (from the past 240 hour(s))  Urine culture     Status: None   Collection Time: 03/27/16  3:34 AM  Result Value Ref Range Status   Specimen Description URINE, CLEAN CATCH  Final   Special Requests Normal  Final  Culture NO GROWTH [facility]   Final   Report Status 03/28/2016 FINAL  Final  MRSA PCR Screening     Status: None   Collection Time: 03/30/16  8:06 AM  Result Value Ref Range Status   MRSA by PCR NEGATIVE NEGATIVE Final    Comment:        The GeneXpert MRSA Assay (FDA approved for NASAL specimens only), is one component of a comprehensive MRSA colonization surveillance program. It is not intended to diagnose MRSA infection nor to guide or monitor treatment for MRSA infections.       Scheduled Meds: . acetaminophen  1,000 mg Oral Q6H  . insulin aspart  0-15 Units Subcutaneous Q4H  . insulin glargine  10 Units Subcutaneous Daily  .  ketorolac  30 mg Intravenous Q6H  . lithium carbonate  450 mg Oral Q12H  . metoprolol succinate  50 mg Oral Daily  . nicotine  21 mg Transdermal Daily  . piperacillin-tazobactam (ZOSYN)  IV  3.375 g Intravenous Q8H  . valproate sodium  500 mg Intravenous Q12H   Continuous Infusions: . sodium chloride 75 mL/hr at 03/31/16 0637    Assessment/Plan:  1. Essential hypertension. On metoprolol. 2. Acute cholecystitis. Status post cholecystectomy. Patient has a drain. Surgical team today did not want to discharge him today. 3.   Bipolar disorder on Depakote and lithium 4.   Type 2 diabetes mellitus on low-dose Lantus here. Since his hemoglobin A1c is high he will need Lantus upon discharge home. Diabetic teaching by nursing staff.  Code Status:     Code Status Orders        Start     Ordered   03/27/16 2232  Full code  Continuous     03/27/16 2232    Code Status History    Date Active Date Inactive Code Status Order ID Comments User Context   10/06/2015 10:15 PM 10/12/2015  6:14 PM Full Code [id number]  Audery AmelJohn T [name], MD Inpatient   04/11/2015  2:15 PM 04/18/2015  4:59 PM Full Code [id number]  Beau FannySurya K Challa, MD Inpatient   02/07/2015  9:14 PM 02/11/2015  7:03 PM Full Code [id number]  Kerry HoughSpencer E Simon, PA-C Inpatient   02/06/2015 10:16 PM 02/07/2015  9:14 PM Full Code [id number]  Toy CookeyMegan Docherty, MD ED     Disposition Plan: Likely discharge tomorrow. Case discussed with general surgery.  Consultants:  Gen. surgery  Antibiotics:  Zosyn  Time spent: 32 minutes  Alford HighlandWIETING, Brysyn  [employer]

## 2016-03-31 NOTE — Progress Notes (Signed)
Patient ID: Greg [name], male   DOB: 04/30/1966, 50 y.o.   MRN: [id number] Pt is one day post op cholecystectomy for gangrenous cholecystitis. Greg [name] states Greg [name] is doing well and tolerating diet with no nausea or vomiting However Greg [name] has yet to get out of bed. Abdomen is soft port sites are clean. JP is intact with serosanguineous drainage of well over 50 mL since surgery. Although Greg [name] seems stable and off for discharge Greg [name] is not ambulated. The drain is not ready to be removed. Suggest that Greg [name] try ambulation and did not abate a little more frequently today. Can be discharged tomorrow either with or without the drain. Discussed with nursing

## 2016-04-01 LAB — GLUCOSE, CAPILLARY
Glucose-Capillary: 132 mg/dL — ABNORMAL HIGH (ref 65–99)
Glucose-Capillary: 152 mg/dL — ABNORMAL HIGH (ref 65–99)

## 2016-04-01 NOTE — Progress Notes (Signed)
Patient ID: Greg [name], male   DOB: 11/11/1965, 50 y.o.   MRN: [id number] Patient appears much more comfortable today. He has been up walking without any difficulty. No nausea or vomiting. Vital signs are stable Abdomen is soft. Drainage was minimal overnight in the drain was removed. He is aware to follow-up with the Dr. Excell Seltzerooper for postop check the end of this week

## 2016-04-01 NOTE — Progress Notes (Signed)
Discharge instructions along with home medications and follow up gone over with patient and mother. Both verbalize that they understood instructions. 5 prescriptions given to patient. IV removed. Taught pt how to check BG and give insulin to himself. He demonstrated doing both correctly. General education about diabetes given also. Pt being discharged home on room air, no distress noted. Otilio JeffersonMadelyn S Fenton, RN

## 2016-04-01 NOTE — Discharge Summary (Signed)
[facility] - [place] [facility]   PATIENT NAME: Greg [name]    MR#:  [id number]  DATE OF BIRTH:  03/29/1966  DATE OF ADMISSION:  03/27/2016 ADMITTING PHYSICIAN: Gladis Riffleatherine L Loflin, MD  DATE OF DISCHARGE: 04/01/2016  PRIMARY CARE PHYSICIAN: Evelene CroonNIEMEYER, MEINDERT, MD    ADMISSION DIAGNOSIS:  Acute cholecystitis [K81.0] Right flank pain [R10.9]  DISCHARGE DIAGNOSIS:  Principal Problem:   Schizoaffective disorder (HCC) Active Problems:   Acute cholecystitis with chronic cholecystitis   SECONDARY DIAGNOSIS:   Past Medical History:  Diagnosis Date  . Bipolar 1 disorder (HCC)   . Diabetes (HCC)   . Dyslipidemia   . HTN (hypertension)   . Obesity   . Paranoia (HCC)   . Schizo affective schizophrenia [facility](HCC)     HOSPITAL COURSE:   1. Acute cholecystitis. Status post cholecystectomy by Dr. Orvis BrillLoflin on 03/30/2016. Dr. Evette CristalSankar removed the drain on 04/01/2016 and cleared for discharge home. Few more days of Augmentin prescribed. Low-fat diet discussed. 2. Essential hypertension on metoprolol 3. Bipolar disorder on Depakote and lithium. Initially the patient's mental status was not good and he was agitated. He is much improved during the hospital course. He was seen by psychiatry. Mental status is back to normal at this point. Continue his usual psychiatric medications. 4. Type 2 diabetes uncontrolled with a hemoglobin A1c that elevated at 12.2. Low-dose insulin prescribed. Diabetic teaching done by nursing staff. Low carbohydrate diet discussed at length. Diet exercise and weight loss discussed. Glucometer and supplies for insulin injection prescribed.  DISCHARGE CONDITIONS:   Satisfactory  CONSULTS OBTAINED:  Treatment Team:  Gladis Riffleatherine L Loflin, MD Audery AmelJohn T [name], MD  DRUG ALLERGIES:   Allergies  Allergen Reactions  . Haldol [Haloperidol] Anaphylaxis  . Seroquel [Quetiapine Fumarate] Other (See Comments)    Reaction: morbid, depressed    DISCHARGE  MEDICATIONS:   Current Discharge Medication List    START taking these medications   Details  amoxicillin-clavulanate (AUGMENTIN) 875-125 MG tablet Take 1 tablet by mouth 2 (two) times daily. Qty: 6 tablet, Refills: 0    insulin glargine (LANTUS) 100 UNIT/ML injection Inject 0.1 mLs (10 Units total) into the skin daily. Qty: 10 mL, Refills: 0    nicotine (NICODERM CQ - DOSED IN MG/24 HOURS) 21 mg/24hr patch Place 1 patch (21 mg total) onto the skin daily. Qty: 28 patch, Refills: 0    oxyCODONE (OXY IR/ROXICODONE) 5 MG immediate release tablet Take 1 tablet (5 mg total) by mouth every 6 (six) hours as needed for severe pain or breakthrough pain. Qty: 15 tablet, Refills: 0      CONTINUE these medications which have CHANGED   Details  metoprolol succinate (TOPROL-XL) 50 MG 24 hr tablet Take 1 tablet (50 mg total) by mouth daily. Take with or immediately following a meal. Qty: 30 tablet, Refills: 0    temazepam (RESTORIL) 15 MG capsule Take 1 capsule (15 mg total) by mouth at bedtime as needed for sleep. Qty: 15 capsule, Refills: 0      CONTINUE these medications which have NOT CHANGED   Details  divalproex (DEPAKOTE) 250 MG DR tablet Take 3 tablets (750 mg total) by mouth every 12 (twelve) hours. Qty: 180 tablet, Refills: 0    lithium carbonate (ESKALITH) 450 MG CR tablet Take 1 tablet (450 mg total) by mouth every 12 (twelve) hours. Qty: 60 tablet, Refills: 0    clonazePAM (KLONOPIN) 1 MG tablet Take 1 tablet (1 mg total) by mouth every 8 (eight) hours  as needed for anxiety. Qty: 90 tablet, Refills: 0    metFORMIN (GLUCOPHAGE) 1000 MG tablet Take 1 tablet (1,000 mg total) by mouth 2 (two) times daily with a meal. Qty: 60 tablet, Refills: 0      STOP taking these medications     amantadine (SYMMETREL) 100 MG capsule      paliperidone (INVEGA SUSTENNA) 234 MG/1.5ML SUSP injection      paliperidone (INVEGA) 6 MG 24 hr tablet      primidone (MYSOLINE) 50 MG tablet       propranolol (INDERAL) 20 MG tablet      simvastatin (ZOCOR) 10 MG tablet          DISCHARGE INSTRUCTIONS:   Follow-up with surgery this week Follow-up with PMD one week  If you experience worsening of your admission symptoms, develop shortness of breath, life threatening emergency, suicidal or homicidal thoughts you must seek medical attention immediately by calling 911 or calling your MD immediately  if symptoms less severe.  You Must read complete instructions/literature along with all the possible adverse reactions/side effects for all the Medicines you take and that have been prescribed to you. Take any new Medicines after you have completely understood and accept all the possible adverse reactions/side effects.   Please note  You were cared for by a hospitalist during your hospital stay. If you have any questions about your discharge medications or the care you received while you were in the hospital after you are discharged, you can call the unit and asked to speak with the hospitalist on call if the hospitalist that took care of you is not available. Once you are discharged, your primary care physician will handle any further medical issues. Please note that NO REFILLS for any discharge medications will be authorized once you are discharged, as it is imperative that you return to your primary care physician (or establish a relationship with a primary care physician if you do not have one) for your aftercare needs so that they can reassess your need for medications and monitor your lab values.    Today   CHIEF COMPLAINT:   Chief Complaint  Patient presents with  . Flank Pain    HISTORY OF PRESENT ILLNESS:  Greg [name]  is a 50 y.o. male presented with abdominal pain and found to have acute cholecystitis   VITAL SIGNS:  Blood pressure (!) 175/90, pulse (!) 103, temperature 98.8 F (37.1 C), temperature source Oral, resp. rate 18, height 5\' 10"  (1.778 m), weight 113.9  kg (251 lb), SpO2 97 %.  Patient's blood pressure variable with mood. Follow-up as outpatient  PHYSICAL EXAMINATION:  GENERAL:  50 y.o.-year-old patient lying in the bed with no acute distress.  EYES: Pupils equal, round, reactive to light and accommodation. No scleral icterus. Extraocular muscles intact.  HEENT: Head atraumatic, normocephalic. Oropharynx and nasopharynx clear.  NECK:  Supple, no jugular venous distention. No thyroid enlargement, no tenderness.  LUNGS: Normal breath sounds bilaterally, no wheezing, rales,rhonchi or crepitation. No use of accessory muscles of respiration.  CARDIOVASCULAR: S1, S2 normal. No murmurs, rubs, or gallops.  ABDOMEN: Soft, Right upper quadrant tenderness, non-distended. Bowel sounds present. No organomegaly or mass.  EXTREMITIES: No pedal edema, cyanosis, or clubbing.  NEUROLOGIC: Cranial nerves II through XII are intact. Muscle strength 5/5 in all extremities. Sensation intact. Gait not checked.  PSYCHIATRIC: The patient is alert and oriented x 3.  SKIN: No obvious rash, lesion, or ulcer.   DATA REVIEW:   CBC  Recent Labs Lab 03/29/16 0434  WBC 13.6*  HGB 12.5*  HCT 37.3*  PLT 236    Chemistries   Recent Labs Lab 03/28/16 1637  NA 135  K 3.9  CL 106  CO2 21*  GLUCOSE 130*  BUN 15  CREATININE 0.84  CALCIUM 8.5*  AST 26  ALT 25  ALKPHOS 100  BILITOT 0.5     Microbiology Results  Results for orders placed or performed during the hospital encounter of 03/27/16  Urine culture     Status: None   Collection Time: 03/27/16  3:34 AM  Result Value Ref Range Status   Specimen Description URINE, CLEAN CATCH  Final   Special Requests Normal  Final   Culture NO GROWTH Performed at [facility]   Final   Report Status 03/28/2016 FINAL  Final  MRSA PCR Screening     Status: None   Collection Time: 03/30/16  8:06 AM  Result Value Ref Range Status   MRSA by PCR NEGATIVE NEGATIVE Final    Comment:        The GeneXpert  MRSA Assay (FDA approved for NASAL specimens only), is one component of a comprehensive MRSA colonization surveillance program. It is not intended to diagnose MRSA infection nor to guide or monitor treatment for MRSA infections.      Management plans discussed with the patient, And he is in agreement.  CODE STATUS:     Code Status Orders        Start     Ordered   03/27/16 2232  Full code  Continuous     03/27/16 2232    Code Status History    Date Active Date Inactive Code Status Order ID Comments User Context   10/06/2015 10:15 PM 10/12/2015  6:14 PM Full Code [id number]  Audery Amel, MD Inpatient   04/11/2015  2:15 PM 04/18/2015  4:59 PM Full Code [id number]  Beau Fanny, MD Inpatient   02/07/2015  9:14 PM 02/11/2015  7:03 PM Full Code [id number]  Kerry Hough, PA-C Inpatient   02/06/2015 10:16 PM 02/07/2015  9:14 PM Full Code [id number]  Toy Cookey, MD ED      TOTAL TIME TAKING CARE OF THIS PATIENT: 32 minutes.    Alford Highland M.D on 04/01/2016 at 10:55 AM  Between 7am to 6pm - Pager - [phone]  After 6pm go to [email or web address] - password [email or web address]  [facility] Office  [phone]  CC: Primary care physician; Evelene Croon, MD

## 2016-04-02 ENCOUNTER — Encounter: Payer: Self-pay | Admitting: Surgery

## 2016-04-04 ENCOUNTER — Encounter: Payer: Self-pay | Admitting: Surgery

## 2016-04-04 ENCOUNTER — Ambulatory Visit (INDEPENDENT_AMBULATORY_CARE_PROVIDER_SITE_OTHER): Payer: Medicare Other | Admitting: Surgery

## 2016-04-04 VITALS — BP 205/109 | HR 89 | Temp 98.5°F | Ht 70.0 in | Wt 255.4 lb

## 2016-04-04 DIAGNOSIS — K8000 Calculus of gallbladder with acute cholecystitis without obstruction: Secondary | ICD-10-CM

## 2016-04-04 LAB — SURGICAL PATHOLOGY

## 2016-04-04 NOTE — Progress Notes (Signed)
Outpatient postop visit  04/04/2016  Greg [name] is an 50 y.o. male.    Procedure: Laparoscopic cholecystectomy  CC: Minimal pain  HPI: This patient is status post laparoscopic cholecystectomy. He ha a drain in place but it was removed prior to discharge. Had a portion of his gallbladder that showed gangrenous material. Overall he is feeling well with minimal pain.  Medications reviewed.    Physical Exam:  Ht 5\' 10"  (1.778 m)     PE: No icterus no jaundice abdomen is soft nontender wounds are clean no erythema no drainage. Calves are nontender    Assessment/Plan:  Patient doing very well. Pathology is not completed yet. Patient will return next week for wound check and to review pathology.  Lattie Hawichard E Cooper, MD, FACS

## 2016-04-04 NOTE — Anesthesia Postprocedure Evaluation (Signed)
Anesthesia Post Note  Patient: Greg [name]  Procedure(s) Performed: Procedure(s) (LRB): LAPAROSCOPIC CHOLECYSTECTOMY (N/A)  Patient location during evaluation: PACU Anesthesia Type: General Level of consciousness: awake and alert Pain management: pain level controlled Vital Signs Assessment: post-procedure vital signs reviewed and stable Respiratory status: spontaneous breathing, nonlabored ventilation, respiratory function stable and patient connected to nasal cannula oxygen Cardiovascular status: blood pressure returned to baseline and stable Postop Assessment: no signs of nausea or vomiting Anesthetic complications: no    Last Vitals:  Vitals:   04/01/16 0606 04/01/16 0926  BP: (!) 182/93 (!) 175/90  Pulse: 95 (!) 103  Resp: 20 18  Temp: 36.8 C 37.1 C    Last Pain:  Vitals:   04/01/16 0926  TempSrc: Oral  PainSc:                  [name]

## 2016-04-04 NOTE — Patient Instructions (Signed)
Call Dr. Martie RoundNeimeyer's office in regards to your Blood Pressure today.   The readings were 205/109 in your right arm and 192/125 in your left arm. This is dangerously high and needs attention from your Primary Care Physician today.  We will see you back in 1 week to review your pathology.

## 2016-04-06 DIAGNOSIS — I10 Essential (primary) hypertension: Secondary | ICD-10-CM | POA: Diagnosis not present

## 2016-04-06 DIAGNOSIS — E119 Type 2 diabetes mellitus without complications: Secondary | ICD-10-CM | POA: Diagnosis not present

## 2016-04-06 DIAGNOSIS — Z0000 Encounter for general adult medical examination without abnormal findings: Secondary | ICD-10-CM | POA: Diagnosis not present

## 2016-04-06 DIAGNOSIS — R5383 Other fatigue: Secondary | ICD-10-CM | POA: Diagnosis not present

## 2016-04-06 DIAGNOSIS — E669 Obesity, unspecified: Secondary | ICD-10-CM | POA: Diagnosis not present

## 2016-04-06 DIAGNOSIS — F17200 Nicotine dependence, unspecified, uncomplicated: Secondary | ICD-10-CM | POA: Diagnosis not present

## 2016-04-06 DIAGNOSIS — K829 Disease of gallbladder, unspecified: Secondary | ICD-10-CM | POA: Diagnosis not present

## 2016-04-06 DIAGNOSIS — Z23 Encounter for immunization: Secondary | ICD-10-CM | POA: Diagnosis not present

## 2016-04-06 DIAGNOSIS — Z716 Tobacco abuse counseling: Secondary | ICD-10-CM | POA: Diagnosis not present

## 2016-04-06 DIAGNOSIS — E559 Vitamin D deficiency, unspecified: Secondary | ICD-10-CM | POA: Diagnosis not present

## 2016-04-06 DIAGNOSIS — E663 Overweight: Secondary | ICD-10-CM | POA: Diagnosis not present

## 2016-04-06 DIAGNOSIS — E7800 Pure hypercholesterolemia, unspecified: Secondary | ICD-10-CM | POA: Diagnosis not present

## 2016-04-12 ENCOUNTER — Ambulatory Visit (INDEPENDENT_AMBULATORY_CARE_PROVIDER_SITE_OTHER): Payer: Medicare Other | Admitting: General Surgery

## 2016-04-12 ENCOUNTER — Encounter: Payer: Self-pay | Admitting: General Surgery

## 2016-04-12 ENCOUNTER — Other Ambulatory Visit: Payer: Self-pay

## 2016-04-12 VITALS — BP 135/90 | HR 97 | Temp 98.7°F | Wt 247.0 lb

## 2016-04-12 DIAGNOSIS — Z4889 Encounter for other specified surgical aftercare: Secondary | ICD-10-CM

## 2016-04-12 NOTE — Patient Instructions (Signed)

## 2016-04-12 NOTE — Progress Notes (Signed)
Outpatient Surgical Follow Up  04/12/2016  Greg PonderRichard [name] is an 50 y.o. male.   Chief Complaint  Patient presents with  . Routine Post Op    Laparoscopic Cholecystectomy 03-30-2016 Dr. Orvis BrillLoflin    HPI: 50 year old male returns to clinic for follow-up from a laparoscopic cholecystectomy. Patient reports he continues to get better and better. He denies any fevers, chills, nausea, vomiting, chest pain, shortness of breath. He is happy with his surgical transient is primarily here to get his pathology report.  Past Medical History:  Diagnosis Date  . Bipolar 1 disorder (HCC)   . Diabetes (HCC)   . Dyslipidemia   . HTN (hypertension)   . Obesity   . Paranoia (HCC)   . Schizo affective schizophrenia [facility](HCC)     Past Surgical History:  Procedure Laterality Date  . APPENDECTOMY  50 years old  . CHOLECYSTECTOMY N/A 03/30/2016   Procedure: LAPAROSCOPIC CHOLECYSTECTOMY;  Surgeon: Gladis Riffleatherine L Loflin, MD;  Location: [facility] ORS;  Service: General;  Laterality: N/A;  . TONSILLECTOMY  50 year old    Family History  Problem Relation Age of Onset  . Depression Mother   . Mental illness Mother   . Depression Father   . Mental illness Father   . Depression Sister   . Mental illness Sister     Social History:  reports that he has been smoking Cigarettes.  He has a 4.00 pack-year smoking history. His smokeless tobacco use includes Snuff. He reports that he does not drink alcohol or use drugs.  Allergies:  Allergies  Allergen Reactions  . Haldol [Haloperidol] Anaphylaxis  . Seroquel [Quetiapine Fumarate] Other (See Comments)    Reaction: morbid, depressed    Medications reviewed.    ROS A multipoint review of systems was completed, all pertinent positives and negatives are documented within the history of present illness remainder negative   BP 135/90   Pulse 97   Temp 98.7 F (37.1 C) (Oral)   Wt 112 kg (247 lb)   BMI 35.44 kg/m   Physical Exam Gen.: No acute distress Chest:  Clear to auscultation Heart: Regular rate and rhythm Abdomen: Soft, purple tender to palpation of the incision sites, nondistended. Well approximated lap scopic incision sites with evidence of scabbing but without any evidence of spreading erythema or drainage. There is induration to the left lateral aspect of the umbilical incision site but without evidence of hernia or infection.    No results found for this or any previous visit (from the past 48 hour(s)). No results found.  Assessment/Plan:  1. Aftercare following surgery 50 year old male 2 weeks status post laparoscopic cholecystectomy. Pathology reviewed with the patient and showed gangrenous cholecystitis. Provided with standard postoperative precautions. He voiced understanding and will follow-up in clinic on an as-needed basis.     Greg Frameharles Kimbery Harwood, MD FACS General Surgeon  04/12/2016,10:00 AM

## 2016-05-02 DIAGNOSIS — F250 Schizoaffective disorder, bipolar type: Secondary | ICD-10-CM | POA: Diagnosis not present

## 2016-05-03 DIAGNOSIS — F250 Schizoaffective disorder, bipolar type: Secondary | ICD-10-CM | POA: Diagnosis not present

## 2016-05-03 DIAGNOSIS — F319 Bipolar disorder, unspecified: Secondary | ICD-10-CM | POA: Diagnosis not present

## 2016-07-17 DIAGNOSIS — F319 Bipolar disorder, unspecified: Secondary | ICD-10-CM | POA: Diagnosis not present

## 2016-07-17 DIAGNOSIS — F250 Schizoaffective disorder, bipolar type: Secondary | ICD-10-CM | POA: Diagnosis not present

## 2016-09-13 DIAGNOSIS — F250 Schizoaffective disorder, bipolar type: Secondary | ICD-10-CM | POA: Diagnosis not present

## 2016-09-13 DIAGNOSIS — F319 Bipolar disorder, unspecified: Secondary | ICD-10-CM | POA: Diagnosis not present

## 2016-11-20 IMAGING — CT CT RENAL STONE PROTOCOL
2 of 4 series · 16 of 46 positions shown, 18 images · non-contrast
Comparison: CT abdomen pelvis 08/04/2012

CLINICAL DATA: Right flank pain

EXAM:
CT ABDOMEN AND PELVIS WITHOUT CONTRAST
TECHNIQUE: Multidetector CT imaging of the abdomen and pelvis was performed
following the standard protocol without IV contrast.

[Series 2: axial st · axial · 0.77mm/px · z∈[-364,+96]mm · 13 of 102 slices shown, 15 images]
[im 5/102  soft-tissue]
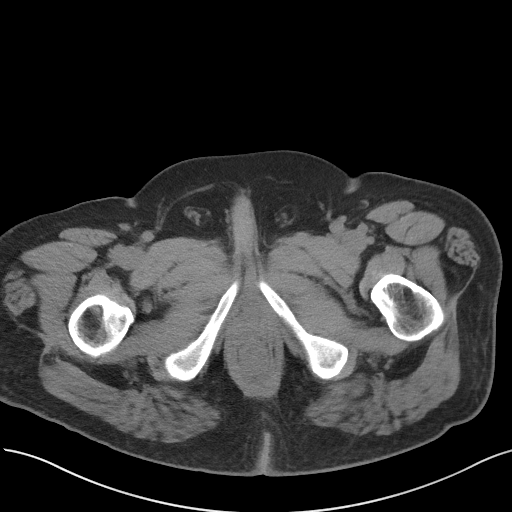
[im 5/102  bone]
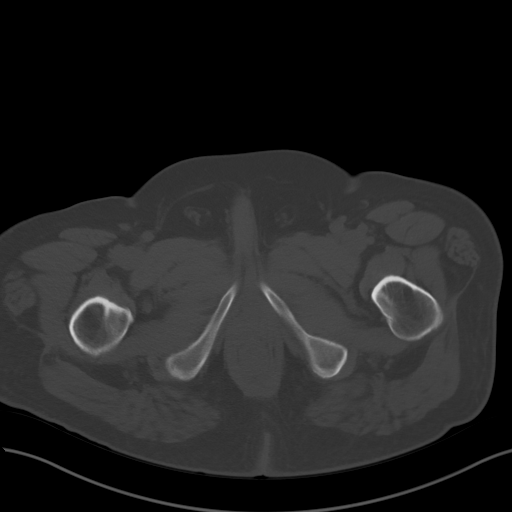
[im 13/102  soft-tissue]
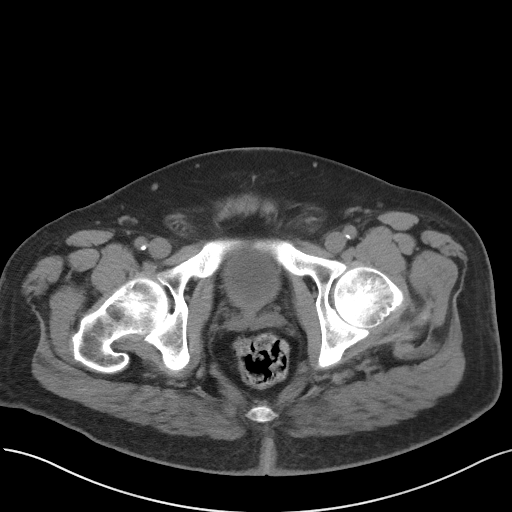
[im 22/102  soft-tissue]
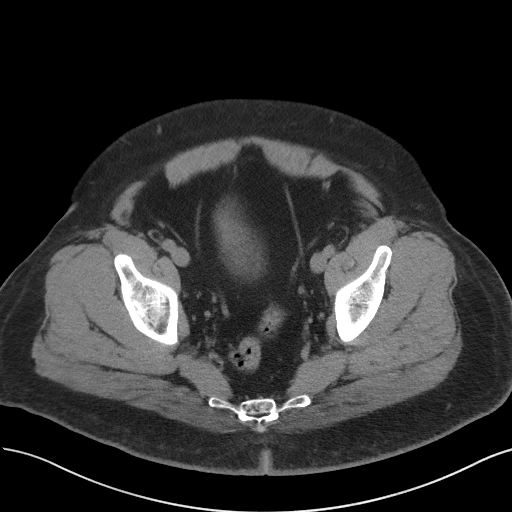
[im 30/102  soft-tissue]
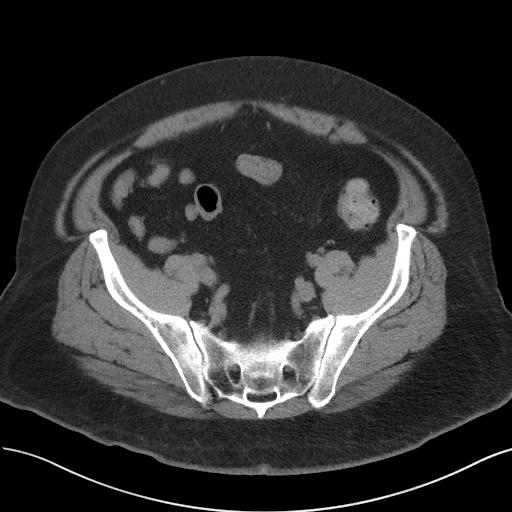
[im 34/102  soft-tissue]
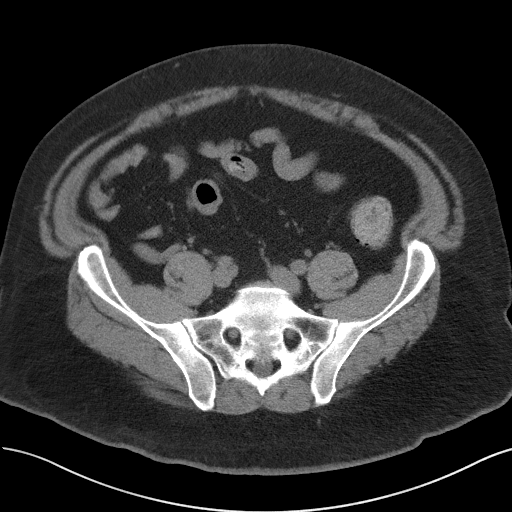
[im 43/102  soft-tissue]
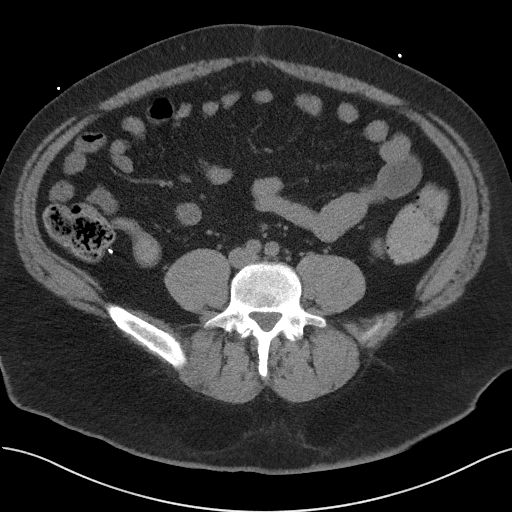
[im 51/102  soft-tissue]
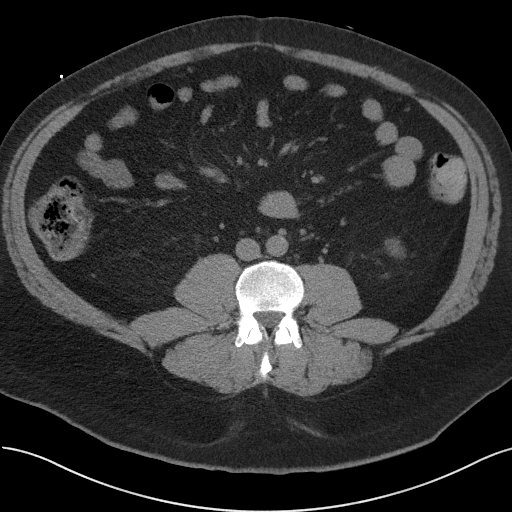
[im 59/102  soft-tissue]
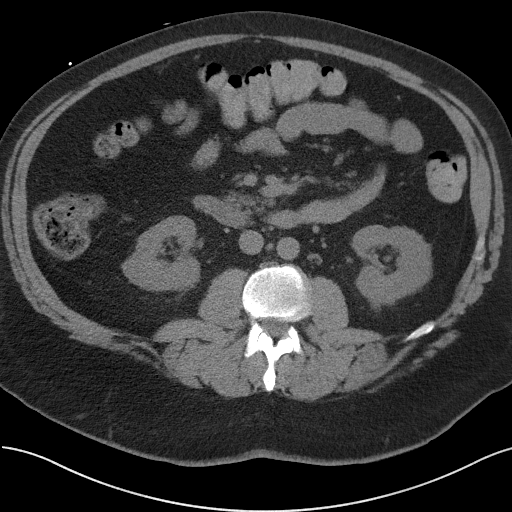
[im 68/102  soft-tissue]
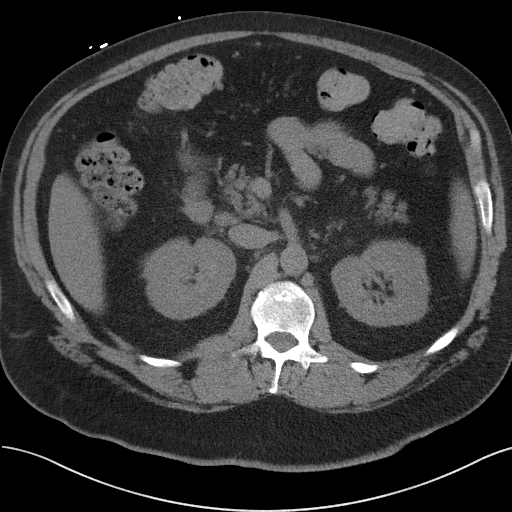
[im 68/102  bone]
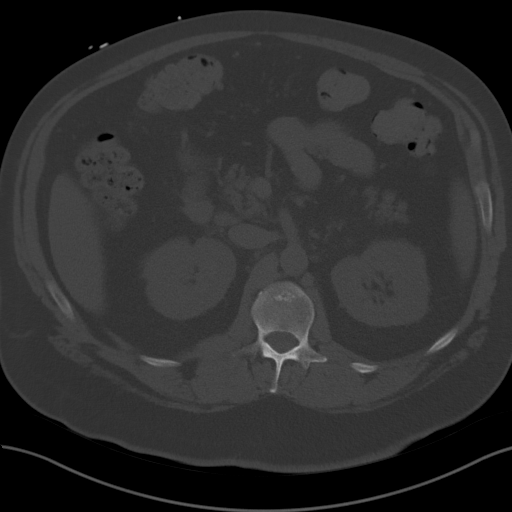
[im 72/102  soft-tissue]
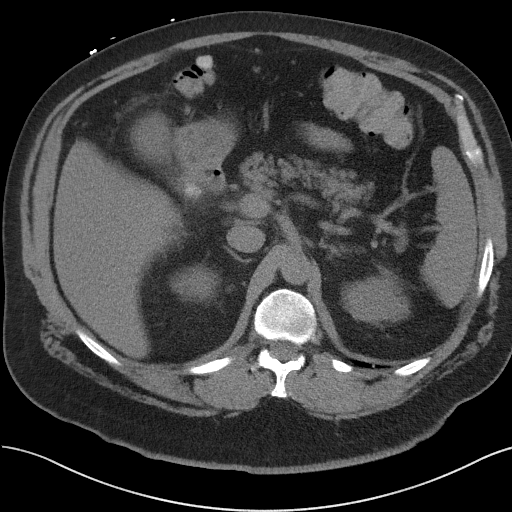
[im 80/102  soft-tissue]
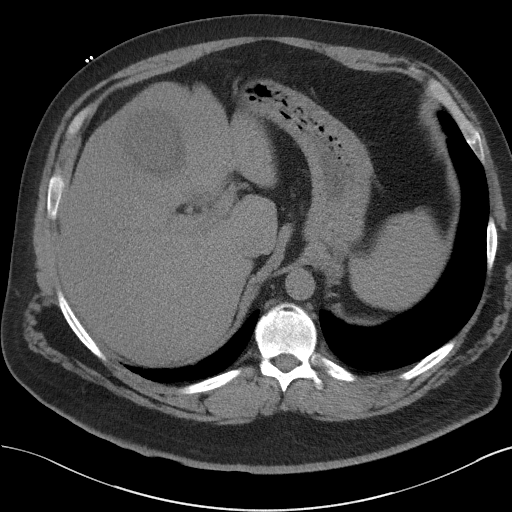
[im 89/102  soft-tissue]
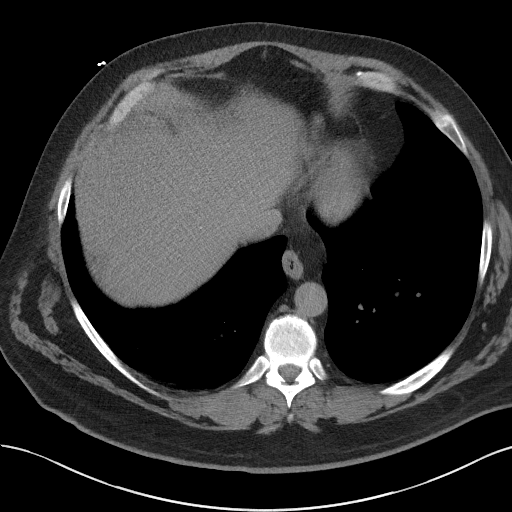
[im 97/102  soft-tissue]
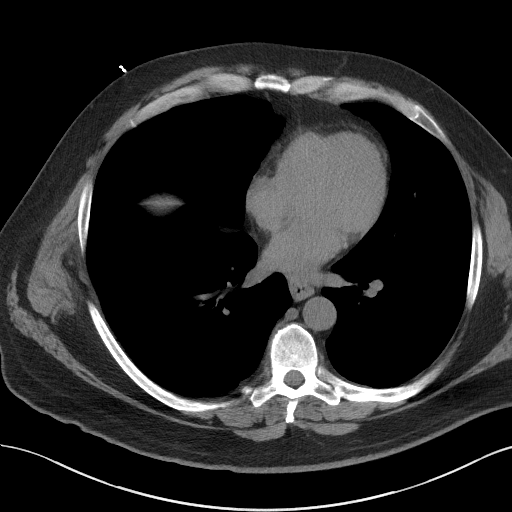

[Series 5: coronal · coronal · 0.85mm/px · 3 of 171 slices shown]
[im 57/171  soft-tissue]
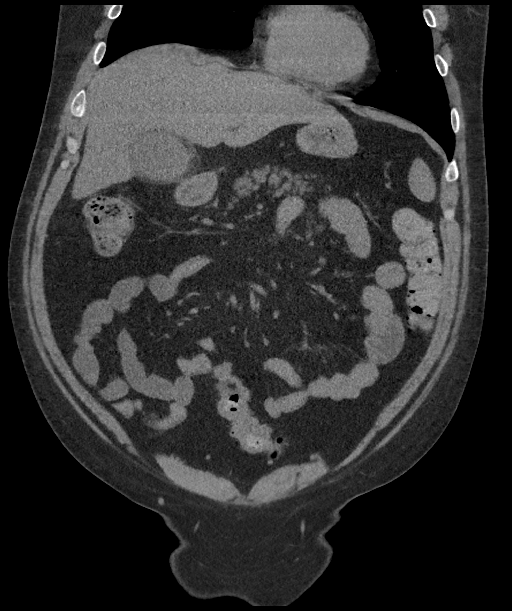
[im 76/171  soft-tissue]
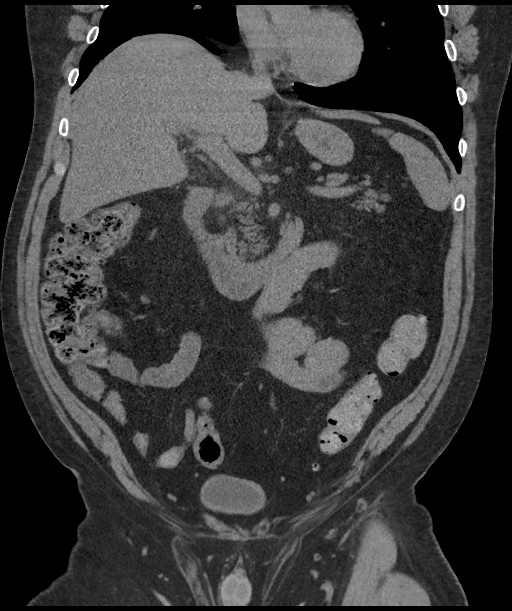
[im 95/171  soft-tissue]
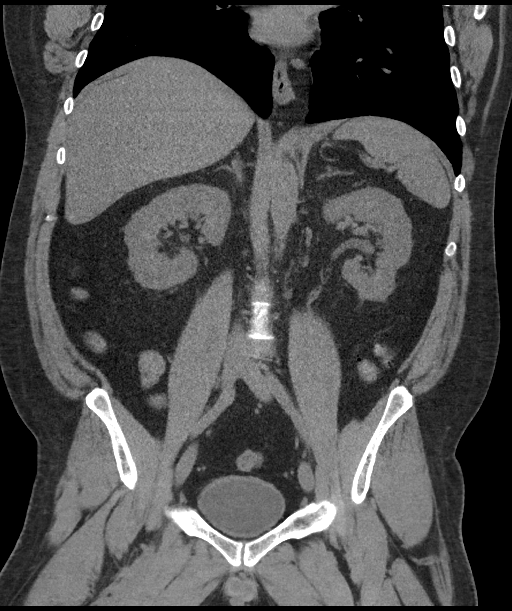

[16 of 46 positions shown; findings below may reference images not displayed]

FINDINGS: Lower chest:  Lung bases clear

Hepatobiliary: Large calcified gallstone in the neck of the
gallbladder measuring 2.6 cm. There is mild gallbladder wall
thickening with edema in the fat surrounding the gallbladder.
Findings suggest acute cholecystitis. Bile ducts nondilated. Liver
is normal.

Pancreas: Negative

Spleen: Negative

Adrenals/Urinary Tract: Negative for urinary tract calculi. No renal
obstruction or mass. Mild bladder wall thickening.

Stomach/Bowel: Stomach and duodenum normal. Negative for bowel
obstruction. Mild colonic diverticulosis with diverticulum the left
colon and sigmoid colon. Negative for diverticulitis. Appendectomy
clips.

Vascular/Lymphatic: Aorta and IVC within normal limits. Negative for
lymphadenopathy

Reproductive: Mild to moderate prostate enlargement with mild
bladder wall thickening.

Other: No free fluid.  Negative for hernia

Musculoskeletal: Negative
IMPRESSION: 2.6 cm calcified gallstone in the neck of the gallbladder.
Gallbladder wall thickening and surrounding edema suggests acute
cholecystitis.

Mild colonic diverticulosis without diverticulitis

## 2016-12-24 DIAGNOSIS — F250 Schizoaffective disorder, bipolar type: Secondary | ICD-10-CM | POA: Diagnosis not present

## 2017-01-03 DIAGNOSIS — F250 Schizoaffective disorder, bipolar type: Secondary | ICD-10-CM | POA: Diagnosis not present

## 2017-04-14 ENCOUNTER — Emergency Department
Admission: EM | Admit: 2017-04-14 | Discharge: 2017-04-16 | Disposition: A | Discharge status: Other Acute Inpatient Hospital | Payer: Medicare Other | Attending: Student in an Organized Health Care Education/Training Program | Admitting: Student in an Organized Health Care Education/Training Program

## 2017-04-14 ENCOUNTER — Other Ambulatory Visit: Payer: Self-pay

## 2017-04-14 DIAGNOSIS — E119 Type 2 diabetes mellitus without complications: Secondary | ICD-10-CM

## 2017-04-14 DIAGNOSIS — Z79899 Other long term (current) drug therapy: Secondary | ICD-10-CM | POA: Diagnosis not present

## 2017-04-14 DIAGNOSIS — R4689 Other symptoms and signs involving appearance and behavior: Secondary | ICD-10-CM | POA: Diagnosis not present

## 2017-04-14 DIAGNOSIS — F312 Bipolar disorder, current episode manic severe with psychotic features: Secondary | ICD-10-CM | POA: Diagnosis not present

## 2017-04-14 DIAGNOSIS — F912 Conduct disorder, adolescent-onset type: Secondary | ICD-10-CM | POA: Diagnosis not present

## 2017-04-14 DIAGNOSIS — F17210 Nicotine dependence, cigarettes, uncomplicated: Secondary | ICD-10-CM | POA: Insufficient documentation

## 2017-04-14 DIAGNOSIS — Z794 Long term (current) use of insulin: Secondary | ICD-10-CM | POA: Insufficient documentation

## 2017-04-14 DIAGNOSIS — I10 Essential (primary) hypertension: Secondary | ICD-10-CM | POA: Diagnosis present

## 2017-04-14 DIAGNOSIS — F2089 Other schizophrenia: Secondary | ICD-10-CM | POA: Diagnosis not present

## 2017-04-14 DIAGNOSIS — E669 Obesity, unspecified: Secondary | ICD-10-CM | POA: Diagnosis present

## 2017-04-14 LAB — COMPREHENSIVE METABOLIC PANEL
ALT: 20 U/L (ref 17–63)
AST: 25 U/L (ref 15–41)
Albumin: 3.4 g/dL — ABNORMAL LOW (ref 3.5–5.0)
Alkaline Phosphatase: 56 U/L (ref 38–126)
Anion gap: 9 (ref 5–15)
BUN: 17 mg/dL (ref 6–20)
CO2: 24 mmol/L (ref 22–32)
Calcium: 8.8 mg/dL — ABNORMAL LOW (ref 8.9–10.3)
Chloride: 96 mmol/L — ABNORMAL LOW (ref 101–111)
Creatinine, Ser: 0.85 mg/dL (ref 0.61–1.24)
GFR calc Af Amer: >60 mL/min (ref >60)
GFR calc non Af Amer: >60 mL/min (ref >60)
Glucose, Bld: 324 mg/dL — ABNORMAL HIGH (ref 65–99)
Potassium: 4 mmol/L (ref 3.5–5.1)
Sodium: 129 mmol/L — ABNORMAL LOW (ref 135–145)
Total Bilirubin: 0.4 mg/dL (ref 0.3–1.2)
Total Protein: 7.1 g/dL (ref 6.5–8.1)

## 2017-04-14 LAB — CBC
HCT: 38.7 % — ABNORMAL LOW (ref 40.0–52.0)
Hemoglobin: 13.1 g/dL (ref 13.0–18.0)
MCH: 28.5 pg (ref 26.0–34.0)
MCHC: 33.8 g/dL (ref 32.0–36.0)
MCV: 84.3 fL (ref 80.0–100.0)
Platelets: 249 10*3/uL (ref 150–440)
RBC: 4.59 MIL/uL (ref 4.40–5.90)
RDW: 13.4 % (ref 11.5–14.5)
WBC: 9.5 10*3/uL (ref 3.8–10.6)

## 2017-04-14 LAB — URINE DRUG SCREEN, QUALITATIVE (ARMC ONLY)
Amphetamines, Ur Screen: NOT DETECTED
Barbiturates, Ur Screen: NOT DETECTED
Benzodiazepine, Ur Scrn: NOT DETECTED
Cannabinoid 50 Ng, Ur ~~LOC~~: NOT DETECTED
Cocaine Metabolite,Ur ~~LOC~~: NOT DETECTED
MDMA (Ecstasy)Ur Screen: NOT DETECTED
Methadone Scn, Ur: NOT DETECTED
Opiate, Ur Screen: NOT DETECTED
Phencyclidine (PCP) Ur S: NOT DETECTED
Tricyclic, Ur Screen: NOT DETECTED

## 2017-04-14 LAB — LITHIUM LEVEL: Lithium Lvl: 0.74 mmol/L (ref 0.60–1.20)

## 2017-04-14 LAB — VALPROIC ACID LEVEL: Valproic Acid Lvl: 32 ug/mL — ABNORMAL LOW (ref 50.0–100.0)

## 2017-04-14 LAB — ETHANOL: Alcohol, Ethyl (B): <5 mg/dL (ref <5)

## 2017-04-14 MED ORDER — DIVALPROEX SODIUM 500 MG PO DR TAB
750.0000 mg | DELAYED_RELEASE_TABLET | Freq: Two times a day (BID) | ORAL | Status: DC
  Administered 2017-04-14 – 2017-04-16 (×3): 750 mg via ORAL
  Filled 2017-04-14 (×5): qty 1

## 2017-04-14 MED ORDER — PALIPERIDONE ER 3 MG PO TB24
6.0000 mg | ORAL_TABLET | Freq: Every day | ORAL | Status: DC
  Administered 2017-04-14 – 2017-04-16 (×2): 6 mg via ORAL
  Filled 2017-04-14: qty 2
  Filled 2017-04-14 (×2): qty 1

## 2017-04-14 MED ORDER — LITHIUM CARBONATE ER 450 MG PO TBCR
450.0000 mg | EXTENDED_RELEASE_TABLET | Freq: Two times a day (BID) | ORAL | Status: DC
  Administered 2017-04-14 – 2017-04-16 (×2): 450 mg via ORAL
  Filled 2017-04-14 (×6): qty 1

## 2017-04-14 MED ORDER — INSULIN GLARGINE 100 UNIT/ML ~~LOC~~ SOLN
10.0000 [IU] | Freq: Every day | SUBCUTANEOUS | Status: DC
  Administered 2017-04-14 – 2017-04-16 (×2): 10 [IU] via SUBCUTANEOUS
  Filled 2017-04-14 (×3): qty 0.1

## 2017-04-14 MED ORDER — METFORMIN HCL 500 MG PO TABS
1000.0000 mg | ORAL_TABLET | Freq: Two times a day (BID) | ORAL | Status: DC
  Administered 2017-04-15 – 2017-04-16 (×3): 1000 mg via ORAL
  Filled 2017-04-14 (×3): qty 2

## 2017-04-14 MED ORDER — METOPROLOL SUCCINATE ER 50 MG PO TB24
50.0000 mg | ORAL_TABLET | Freq: Every day | ORAL | Status: DC
  Administered 2017-04-14 – 2017-04-16 (×2): 50 mg via ORAL
  Filled 2017-04-14 (×3): qty 1

## 2017-04-14 MED ORDER — LORAZEPAM 2 MG PO TABS
2.0000 mg | ORAL_TABLET | Freq: Once | ORAL | Status: AC
  Administered 2017-04-14: 2 mg via ORAL
  Filled 2017-04-14: qty 1

## 2017-04-14 NOTE — ED Notes (Signed)

## 2017-04-14 NOTE — ED Notes (Signed)
Pt. To BHU from ED ambulatory without difficulty, to room  BHU7. Report from [facility]. Pt. Is alert and oriented, warm and dry in no distress. Pt. Denies SI, HI, and AVH. Pt. Calm and cooperative. Pt. Made aware of security cameras and Q15 minute rounds. Pt. Encouraged to let Nursing staff know of any concerns or needs.

## 2017-04-14 NOTE — ED Notes (Signed)
Report given to Selena Batten, RN in ED BHU.

## 2017-04-14 NOTE — ED Notes (Signed)
BEHAVIORAL HEALTH ROUNDING  Patient sleeping: No.  Patient alert and oriented: yes  Behavior appropriate: Yes. ; If no, describe:  Nutrition and fluids offered: Yes  Toileting and hygiene offered: Yes  Sitter present: not applicable, Q 15 min safety rounds and observation.  Law enforcement present: Yes ODS  
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
## 2017-04-14 NOTE — ED Notes (Signed)
BEHAVIORAL HEALTH ROUNDING Patient sleeping: Yes.   Patient alert and oriented: not applicable SLEEPING Behavior appropriate: Yes.  ; If no, describe: SLEEPING Nutrition and fluids offered: No SLEEPING Toileting and hygiene offered: NoSLEEPING Sitter present: not applicable, Q 15 min safety rounds and observation. Law enforcement present: Yes ODS 

## 2017-04-14 NOTE — ED Notes (Signed)

## 2017-04-14 NOTE — ED Notes (Signed)
Patient IVC/SOC Complete/Pending Admission.
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
## 2017-04-14 NOTE — ED Notes (Signed)
Report given to [facility] MD. Camera in room and pt is understanding of consult process.
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
## 2017-04-14 NOTE — ED Notes (Signed)
Pt dressed out. Clothes and wallet with one dollar placed in patient belonging bag.

## 2017-04-15 ENCOUNTER — Emergency Department: Payer: Medicare Other

## 2017-04-15 DIAGNOSIS — R4689 Other symptoms and signs involving appearance and behavior: Secondary | ICD-10-CM | POA: Diagnosis not present

## 2017-04-15 DIAGNOSIS — F312 Bipolar disorder, current episode manic severe with psychotic features: Secondary | ICD-10-CM | POA: Diagnosis not present

## 2017-04-15 LAB — URINALYSIS, COMPLETE (UACMP) WITH MICROSCOPIC
Bacteria, UA: NONE SEEN
Bilirubin Urine: NEGATIVE
Glucose, UA: NEGATIVE mg/dL
Hgb urine dipstick: NEGATIVE
Ketones, ur: NEGATIVE mg/dL
Leukocytes, UA: NEGATIVE
Nitrite: NEGATIVE
Protein, ur: NEGATIVE mg/dL
RBC / HPF: NONE SEEN RBC/hpf (ref 0–5)
Specific Gravity, Urine: 1.009 (ref 1.005–1.030)
Squamous Epithelial / LPF: NONE SEEN
pH: 7 (ref 5.0–8.0)

## 2017-04-15 LAB — BLOOD GAS, ARTERIAL
Acid-Base Excess: 1.2 mmol/L (ref 0.0–2.0)
Bicarbonate: 26 mmol/L (ref 20.0–28.0)
FIO2: 0.21
O2 Saturation: 95.2 %
Patient temperature: 37
pCO2 arterial: 41 mmHg (ref 32.0–48.0)
pH, Arterial: 7.41 (ref 7.350–7.450)
pO2, Arterial: 76 mmHg — ABNORMAL LOW (ref 83.0–108.0)

## 2017-04-15 LAB — BASIC METABOLIC PANEL
Anion gap: 6 (ref 5–15)
BUN: 12 mg/dL (ref 6–20)
CO2: 26 mmol/L (ref 22–32)
Calcium: 8.9 mg/dL (ref 8.9–10.3)
Chloride: 103 mmol/L (ref 101–111)
Creatinine, Ser: 0.84 mg/dL (ref 0.61–1.24)
GFR calc Af Amer: >60 mL/min (ref >60)
GFR calc non Af Amer: >60 mL/min (ref >60)
Glucose, Bld: 190 mg/dL — ABNORMAL HIGH (ref 65–99)
Potassium: 4.5 mmol/L (ref 3.5–5.1)
Sodium: 135 mmol/L (ref 135–145)

## 2017-04-15 LAB — GLUCOSE, CAPILLARY
Glucose-Capillary: 146 mg/dL — ABNORMAL HIGH (ref 65–99)
Glucose-Capillary: 179 mg/dL — ABNORMAL HIGH (ref 65–99)
Glucose-Capillary: 183 mg/dL — ABNORMAL HIGH (ref 65–99)

## 2017-04-15 LAB — BLOOD GAS, VENOUS
Acid-Base Excess: 2 mmol/L (ref 0.0–2.0)
Bicarbonate: 26.5 mmol/L (ref 20.0–28.0)
O2 Saturation: 90.1 %
Patient temperature: 37
pCO2, Ven: 40 mmHg — ABNORMAL LOW (ref 44.0–60.0)
pH, Ven: 7.43 (ref 7.250–7.430)
pO2, Ven: 57 mmHg — ABNORMAL HIGH (ref 32.0–45.0)

## 2017-04-15 NOTE — ED Notes (Signed)
ED Charge nurse notified of pt's BMP order

## 2017-04-15 NOTE — ED Notes (Signed)
Patient had incontinent episode. And urinated in floor from BHU 7 to bathroom. This Clinical research associate and Tech changed sheets on bed and gave patient clean dry scrubs to change into.

## 2017-04-15 NOTE — ED Notes (Signed)
Patient had incontinent episode, and urinated in floor going to the bathroom from BHU 7. Patient cleaned and given cleaned scrubs and sheets changed on bed.

## 2017-04-15 NOTE — ED Notes (Addendum)
Patient extremely drowsy and lethargic when obtaining vital signs- unable to stay awake. ED Dr notified concerning pt's symptoms. ED Dr to put in order for BMP.

## 2017-04-15 NOTE — Progress Notes (Signed)
Pt verbally refused to wear cpap, states he has been told in the past that he needs to wear one be will not wear it. RN notified, CPAP left at nursing station in case patient decides to wear. Sats while awake are 97% on room air.

## 2017-04-15 NOTE — ED Notes (Signed)
Patient awake in bathroom attempting to clean urine up with scrub top, then proceeded to put top back on. Pt brought new scrub top to put on. Pt calm and cooperative with blunted affect. Pt asked if he was allowed in dayroom- reassured pt that he didn't have to stay in his room. Pt remains safe with 15 minute checks.

## 2017-04-15 NOTE — ED Notes (Signed)
Medications held at this time because pt appears too drowsy

## 2017-04-15 NOTE — ED Notes (Signed)
Pt very drowsy- lab tech reports pt was not able to stay awake during lab draw. ED Dr notified of pt's condition

## 2017-04-15 NOTE — ED Notes (Signed)
Performed CBG, 179. Will continue to monitor the pt.

## 2017-04-15 NOTE — ED Notes (Signed)
Pt ate 100% breakfast. Encouraged pt to sit up in dayroom after eating, seeing that pt still appeared drowsy. Pt soon was seen leaning over in chair with eyes closed as if he were sleeping. Encouraged pt to go back to bed.

## 2017-04-15 NOTE — Consult Note (Signed)
[facility] Face-to-Face Psychiatry Consult   Reason for Consult:  Consult for 51 year old man with a history of bipolar disorder brought in on commitment by his mother because of escalating mania Referring Physician:  Alfred Levins Patient Identification: Greg [name] MRN:  [id number] Principal Diagnosis: Bipolar affective disorder, currently manic, severe, with psychotic features [facility]) Diagnosis:   Patient Active Problem List   Diagnosis Date Noted  . Schizoaffective disorder ([place]) [F25.9] 03/28/2016  . Acute cholecystitis with chronic cholecystitis [K81.2] 03/27/2016  . Anxiety [F41.9]   . 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 Bipolar 2 disorder ([place]) [F31.81]   . Hyponatremia [E87.1] 10/06/2015  . Bipolar affective disorder, currently manic, severe, with psychotic features ([place]) [F31.2] 11/28/2014  . Essential hypertension [I10] 11/28/2014  . Dyslipidemia [E78.5] 11/28/2014  . Obesity [E66.9] 11/28/2014  . Diabetes ([place]) [E11.9] 11/28/2014  . Tobacco use disorder [F17.200] 11/28/2014    Total Time spent with patient: 1 hour  Subjective:   Greg [name] is a 51 y.o. male patient admitted with "my parents keep the door locked".  HPI:  13 year old man with a history of bipolar disorder. Mother filed commitment papers reporting that he had been violent or threatening to her. Patient is talking with me but is disorganized in his speech and a little hard to follow. He says that his parents always keep 2 doors locked in between their bedroom and where he is which frustrates his ability to get their attention. Therefore he has to pound hard on the doors and even walk around outside the house and pound on their windows when he wants to get their attention. He admits however that "things got out of hand" and that he was yelling and acting kind of crazy. Law enforcement cane and he stayed agitated. Patient admits that he's been feeling more manic and up recently. Not sleeping very well. Mood is  very good. He talks about how he is studying to become the President of the [place] and that everything he does is "to save the little babies". He claims that he is taking his medicine as prescribed and his blood levels do not look reasonably good. Denies that he's been drinking or using any drugs. Denies any particular new stresses. Patient denies any thoughts of wanting to hurt anyone else. He is vague about hallucinations. Mother says that behaviors have been worsening over at least a week.  Social history: Patient is disabled and lives with his parents. Divorced. Little social contact outside his home.  Medical history: Diabetes and high blood pressure dyslipidemia  Substance abuse history: No major substance abuse problems no serious problems with alcohol or drugs in the past. Patient himself says he used to be "an alcoholic" but says that that means that he used to drink a couple of beers a day which she no longer does.  Past Psychiatric History: Multiple prior hospitalizations he estimates over 51. The last time he was here it was also an episode of mania which tends to be his presentation. Just like this time he at that time seemed to be compliant with his medicine but for several recent still decompensated. He does have a distant past history of suicide attempts denies ever being seriously violent. It escalated to the point of agitation though when he is manic  Risk to Self: Suicidal Ideation: No Suicidal Intent: No Is patient at risk for suicide?: No Suicidal Plan?: No Access to Means: No What has been your use of drugs/alcohol within the last 12 months?: occassional use  of alcohol How many times?: 0 Other Self Harm Risks: Denied Triggers for Past Attempts: None known Intentional Self Injurious Behavior: None Risk to Others: Homicidal Ideation: No Thoughts of Harm to Others: No Current Homicidal Intent: No Current Homicidal Plan: No Access to Homicidal Means: No Identified  Victim: None identified History of harm to others?: No Assessment of Violence: None Noted Violent Behavior Description: Denied by patient Does patient have access to weapons?: No Criminal Charges Pending?: No Does patient have a court date: No Prior Inpatient Therapy: Prior Inpatient Therapy: Yes Prior Therapy Dates: 2017 and prior Prior Therapy Facilty/Provider(s): [facility] [place] Reason for Treatment: Schizophrenia, Bipolar Disorder Prior Outpatient Therapy: Prior Outpatient Therapy: Yes Prior Therapy Dates: Current Prior Therapy Facilty/Provider(s): [facility] Reason for Treatment: Bipolar Disorder Does patient have an ACCT team?: No Does patient have Intensive In-House Services?  : No Does patient have [facility] services? : Yes Does patient have P4[facility] services?: No  Past Medical History:  Past Medical History:  Diagnosis Date  . Bipolar 1 disorder ([place])   . Diabetes ([place])   . Dyslipidemia   . HTN (hypertension)   . Obesity   . Paranoia ([place])   . Schizo affective schizophrenia [facility])     Past Surgical History:  Procedure Laterality Date  . APPENDECTOMY  51 years old  . CHOLECYSTECTOMY N/A 03/30/2016   Procedure: LAPAROSCOPIC CHOLECYSTECTOMY;  Surgeon: Hubbard Robinson, MD;  Location: [facility] ORS;  Service: General;  Laterality: N/A;  . TONSILLECTOMY  51 year old   Family History:  Family History  Problem Relation Age of Onset  . Depression Mother   . Mental illness Mother   . Depression Father   . Mental illness Father   . Depression Sister   . Mental illness Sister    Family Psychiatric  History: Possible family history of mood disorder Social History:  History  Alcohol Use No     History  Drug Use No    Social History   Social History  . Marital status: Single    Spouse name: N/A  . Number of children: N/A  . Years of education: N/A   Social History Main Topics  . Smoking status: Current Every Day Smoker    Packs/day: 0.50    Years: 8.00    Types:  Cigarettes  . Smokeless tobacco: Current User    Types: Snuff  . Alcohol use No  . Drug use: No  . Sexual activity: No   Other Topics Concern  . None   Social History Narrative  . None   Additional Social History:    Allergies:   Allergies  Allergen Reactions  . Haldol [Haloperidol] Anaphylaxis  . Seroquel [Quetiapine Fumarate] Other (See Comments)    Reaction: morbid, depressed    Labs:  Results for orders placed or performed during the hospital encounter of 04/14/17 (from the past 48 hour(s))  Comprehensive metabolic panel     Status: Abnormal   Collection Time: 04/14/17  6:48 PM  Result Value Ref Range   Sodium 129 (L) 135 - 145 mmol/L   Potassium 4.0 3.5 - 5.1 mmol/L   Chloride 96 (L) 101 - 111 mmol/L   CO2 24 22 - 32 mmol/L   Glucose, Bld 324 (H) 65 - 99 mg/dL   BUN 17 6 - 20 mg/dL   Creatinine, Ser 0.85 0.61 - 1.24 mg/dL   Calcium 8.8 (L) 8.9 - 10.3 mg/dL   Total Protein 7.1 6.5 - 8.1 g/dL   Albumin 3.4 (L)  3.5 - 5.0 g/dL   AST 25 15 - 41 U/L   ALT 20 17 - 63 U/L   Alkaline Phosphatase 56 38 - 126 U/L   Total Bilirubin 0.4 0.3 - 1.2 mg/dL   GFR calc non Af Amer >60 >60 mL/min   GFR calc Af Amer >60 >60 mL/min    Comment: (NOTE) The eGFR has been calculated using the CKD EPI equation. This calculation has not been validated in all clinical situations. eGFR's persistently <60 mL/min signify possible Chronic Kidney Disease.    Anion gap 9 5 - 15  Ethanol     Status: None   Collection Time: 04/14/17  6:48 PM  Result Value Ref Range   Alcohol, Ethyl (B) <5 <5 mg/dL    Comment:        LOWEST DETECTABLE LIMIT FOR SERUM ALCOHOL IS 5 mg/dL FOR MEDICAL PURPOSES ONLY   cbc     Status: Abnormal   Collection Time: 04/14/17  6:48 PM  Result Value Ref Range   WBC 9.5 3.8 - 10.6 K/uL   RBC 4.59 4.40 - 5.90 MIL/uL   Hemoglobin 13.1 13.0 - 18.0 g/dL   HCT 38.7 (L) 40.0 - 52.0 %   MCV 84.3 80.0 - 100.0 fL   MCH 28.5 26.0 - 34.0 pg   MCHC 33.8 32.0 - 36.0 g/dL    RDW 13.4 11.5 - 14.5 %   Platelets 249 150 - 440 K/uL  Urine Drug Screen, Qualitative     Status: None   Collection Time: 04/14/17  6:48 PM  Result Value Ref Range   Tricyclic, Ur Screen NONE DETECTED NONE DETECTED   Amphetamines, Ur Screen NONE DETECTED NONE DETECTED   MDMA (Ecstasy)Ur Screen NONE DETECTED NONE DETECTED   Cocaine Metabolite,Ur [place] NONE DETECTED NONE DETECTED   Opiate, Ur Screen NONE DETECTED NONE DETECTED   Phencyclidine (PCP) Ur S NONE DETECTED NONE DETECTED   Cannabinoid 50 Ng, Ur  NONE DETECTED NONE DETECTED   Barbiturates, Ur Screen NONE DETECTED NONE DETECTED   Benzodiazepine, Ur Scrn NONE DETECTED NONE DETECTED   Methadone Scn, Ur NONE DETECTED NONE DETECTED    Comment: (NOTE) 962  Tricyclics, urine               Cutoff 1000 ng/mL 200  Amphetamines, urine             Cutoff 1000 ng/mL 300  MDMA (Ecstasy), urine           Cutoff 500 ng/mL 400  Cocaine Metabolite, urine       Cutoff 300 ng/mL 500  Opiate, urine                   Cutoff 300 ng/mL 600  Phencyclidine (PCP), urine      Cutoff 25 ng/mL 700  Cannabinoid, urine              Cutoff 50 ng/mL 800  Barbiturates, urine             Cutoff 200 ng/mL 900  Benzodiazepine, urine           Cutoff 200 ng/mL 1000 Methadone, urine                Cutoff 300 ng/mL 1100 1200 The urine drug screen provides only a preliminary, unconfirmed 1300 analytical test result and should not be used for non-medical 1400 purposes. Clinical consideration and professional judgment should 1500 be applied to any positive drug screen result due to possible  1600 interfering substances. A more specific alternate chemical method 1700 must be used in order to obtain a confirmed analytical result.  1800 Gas chromato graphy / mass spectrometry (GC/MS) is the preferred 1900 confirmatory method.   Lithium level     Status: None   Collection Time: 04/14/17  6:48 PM  Result Value Ref Range   Lithium Lvl 0.74 0.60 - 1.20 mmol/L   Valproic acid level     Status: Abnormal   Collection Time: 04/14/17  6:48 PM  Result Value Ref Range   Valproic Acid Lvl 32 (L) 50.0 - 100.0 ug/mL  Glucose, capillary     Status: Abnormal   Collection Time: 04/15/17  7:50 AM  Result Value Ref Range   Glucose-Capillary 146 (H) 65 - 99 mg/dL  Basic metabolic panel     Status: Abnormal   Collection Time: 04/15/17 12:01 PM  Result Value Ref Range   Sodium 135 135 - 145 mmol/L   Potassium 4.5 3.5 - 5.1 mmol/L   Chloride 103 101 - 111 mmol/L   CO2 26 22 - 32 mmol/L   Glucose, Bld 190 (H) 65 - 99 mg/dL   BUN 12 6 - 20 mg/dL   Creatinine, Ser 0.84 0.61 - 1.24 mg/dL   Calcium 8.9 8.9 - 10.3 mg/dL   GFR calc non Af Amer >60 >60 mL/min   GFR calc Af Amer >60 >60 mL/min    Comment: (NOTE) The eGFR has been calculated using the CKD EPI equation. This calculation has not been validated in all clinical situations. eGFR's persistently <60 mL/min signify possible Chronic Kidney Disease.    Anion gap 6 5 - 15  Blood gas, arterial     Status: Abnormal   Collection Time: 04/15/17 12:40 PM  Result Value Ref Range   FIO2 0.21    pH, Arterial 7.41 7.350 - 7.450   pCO2 arterial 41 32.0 - 48.0 mmHg   pO2, Arterial 76 (L) 83.0 - 108.0 mmHg   Bicarbonate 26.0 20.0 - 28.0 mmol/L   Acid-Base Excess 1.2 0.0 - 2.0 mmol/L   O2 Saturation 95.2 %   Patient temperature 37.0    Collection site LEFT RADIAL    Sample type ARTERIAL DRAW    Allens test (pass/fail) PASS PASS  Glucose, capillary     Status: Abnormal   Collection Time: 04/15/17  1:46 PM  Result Value Ref Range   Glucose-Capillary 179 (H) 65 - 99 mg/dL    Current Facility-Administered Medications  Medication Dose Route Frequency Provider Last Rate Last Dose  . divalproex (DEPAKOTE) DR tablet 750 mg  750 mg Oral Q12H Merlyn Lot, MD   Stopped at 04/15/17 [phone]  . insulin glargine (LANTUS) injection 10 Units  10 Units Subcutaneous Daily Merlyn Lot, MD   10 Units at 04/14/17 2239   . lithium carbonate (ESKALITH) CR tablet 450 mg  450 mg Oral Q12H Merlyn Lot, MD   Stopped at 04/15/17 0957  . metFORMIN (GLUCOPHAGE) tablet 1,000 mg  1,000 mg Oral BID WC Merlyn Lot, MD   1,000 mg at 04/15/17 0762  . metoprolol succinate (TOPROL-XL) 24 hr tablet 50 mg  50 mg Oral Daily Merlyn Lot, MD   Stopped at 04/15/17 [phone]  . paliperidone (INVEGA) 24 hr tablet 6 mg  6 mg Oral QHS Merlyn Lot, MD   6 mg at 04/14/17 2238   Current Outpatient Prescriptions  Medication Sig Dispense Refill  . amoxicillin-clavulanate (AUGMENTIN) 875-125 MG tablet Take 1 tablet by mouth 2 (two)  times daily. 6 tablet 0  . divalproex (DEPAKOTE) 250 MG DR tablet Take 3 tablets (750 mg total) by mouth every 12 (twelve) hours. 180 tablet 0  . insulin glargine (LANTUS) 100 UNIT/ML injection Inject 0.1 mLs (10 Units total) into the skin daily. 10 mL 0  . lithium carbonate (ESKALITH) 450 MG CR tablet Take 1 tablet (450 mg total) by mouth every 12 (twelve) hours. 60 tablet 0  . metFORMIN (GLUCOPHAGE) 1000 MG tablet Take 1 tablet (1,000 mg total) by mouth 2 (two) times daily with a meal. 60 tablet 0  . metoprolol succinate (TOPROL-XL) 50 MG 24 hr tablet Take 1 tablet (50 mg total) by mouth daily. Take with or immediately following a meal. 30 tablet 0  . pravastatin (PRAVACHOL) 20 MG tablet Take 1 tablet by mouth 1 day or 1 dose.    . temazepam (RESTORIL) 15 MG capsule Take 1 capsule (15 mg total) by mouth at bedtime as needed for sleep. 15 capsule 0    Musculoskeletal: Strength & Muscle Tone: within normal limits Gait & Station: normal Patient leans: N/A  Psychiatric Specialty Exam: Physical Exam  Nursing note and vitals reviewed. Constitutional: He appears well-developed and well-nourished.  HENT:  Head: Normocephalic and atraumatic.  Eyes: Pupils are equal, round, and reactive to light. Conjunctivae are normal.  Neck: Normal range of motion.  Cardiovascular: Normal heart sounds.    Respiratory: Effort normal.  GI: Soft.  Musculoskeletal: Normal range of motion.  Neurological: He is alert.  Skin: Skin is warm and dry.  Psychiatric: His affect is labile and inappropriate. His speech is tangential. Thought content is paranoid and delusional. Cognition and memory are impaired. He expresses impulsivity. He expresses no homicidal and no suicidal ideation. He is inattentive.    Review of Systems  Constitutional: Negative.   HENT: Negative.   Eyes: Negative.   Respiratory: Negative.   Cardiovascular: Negative.   Gastrointestinal: Negative.   Musculoskeletal: Negative.   Skin: Negative.   Neurological: Negative.   Psychiatric/Behavioral: Negative for depression, hallucinations, memory loss, substance abuse and suicidal ideas. The patient has insomnia. The patient is not nervous/anxious.     Blood pressure (!) 151/69, pulse 83, temperature 98.1 F (36.7 C), temperature source Oral, resp. rate 18, height 5' 9"  (1.753 m), weight 117.9 kg (260 lb), SpO2 100 %.Body mass index is 38.4 kg/m.  General Appearance: Disheveled  Eye Contact:  Fair  Speech:  Garbled  Volume:  Normal  Mood:  Euphoric  Affect:  Congruent  Thought Process:  Disorganized  Orientation:  Full (Time, Place, and Person)  Thought Content:  Illogical, Delusions and Paranoid Ideation  Suicidal Thoughts:  No  Homicidal Thoughts:  No  Memory:  Immediate;   Good Recent;   Fair Remote;   Fair  Judgement:  Impaired  Insight:  Shallow  Psychomotor Activity:  Normal  Concentration:  Concentration: Fair  Recall:  [employer] of Knowledge:  Fair  Language:  Fair  Akathisia:  No  Handed:  Right  AIMS (if indicated):     Assets:  Communication Skills Desire for Improvement Financial Resources/Insurance Housing Resilience Social Support  ADL's:  Impaired  Cognition:  Impaired,  Mild  Sleep:        Treatment Plan Summary: Medication management and Plan 51 year old man with bipolar disorder  presents with his usual manic psychotic presentation. Was agitated and aggressive at home. Seems to be calming down a little bit but still better off being admitted for stabilization. He understands  and basically agrees to the plan. Continue current orders for antipsychotic and Depakote and lithium and other medicines. Full set of labs completed. He can be admitted downstairs when bed availability allows.  Disposition: Recommend psychiatric Inpatient admission when medically cleared. Supportive therapy provided about ongoing stressors.  Alethia Berthold, MD 04/15/2017 6:56 PM

## 2017-04-15 NOTE — ED Notes (Signed)
Referral information for Psychiatric Hospitalization faxed to;      [facility] [phone] or [phone]   [facility] ([phone]),    [facility] [phone]),    [facility] [phone]),    [facility] [phone]).

## 2017-04-15 NOTE — ED Notes (Signed)
Pt placed in [facility] , pt has slurred speech but answers questions appropriately. Pt has periods of apnea during sleep. EDP aware. Pt placed on a cardiac monitor.

## 2017-04-15 NOTE — ED Notes (Signed)
Pt noted to have apneic periods with sats dropping 87-88% while sleeping. EDP aware and pt repositioned.[name]

## 2017-04-16 ENCOUNTER — Inpatient Hospital Stay
Admission: AD | Admit: 2017-04-16 | Discharge: 2017-05-09 | DRG: 885 | Disposition: A | Discharge status: Home / Self Care | Payer: Medicare Other | Attending: Psychiatry | Admitting: Psychiatry

## 2017-04-16 DIAGNOSIS — F419 Anxiety disorder, unspecified: Secondary | ICD-10-CM | POA: Diagnosis present

## 2017-04-16 DIAGNOSIS — G4700 Insomnia, unspecified: Secondary | ICD-10-CM | POA: Diagnosis present

## 2017-04-16 DIAGNOSIS — I10 Essential (primary) hypertension: Secondary | ICD-10-CM | POA: Diagnosis present

## 2017-04-16 DIAGNOSIS — Z23 Encounter for immunization: Secondary | ICD-10-CM

## 2017-04-16 DIAGNOSIS — E785 Hyperlipidemia, unspecified: Secondary | ICD-10-CM | POA: Diagnosis present

## 2017-04-16 DIAGNOSIS — Z7984 Long term (current) use of oral hypoglycemic drugs: Secondary | ICD-10-CM

## 2017-04-16 DIAGNOSIS — Z79899 Other long term (current) drug therapy: Secondary | ICD-10-CM | POA: Diagnosis not present

## 2017-04-16 DIAGNOSIS — F250 Schizoaffective disorder, bipolar type: Principal | ICD-10-CM | POA: Diagnosis present

## 2017-04-16 DIAGNOSIS — Z9119 Patient's noncompliance with other medical treatment and regimen: Secondary | ICD-10-CM | POA: Diagnosis not present

## 2017-04-16 DIAGNOSIS — F17210 Nicotine dependence, cigarettes, uncomplicated: Secondary | ICD-10-CM | POA: Diagnosis not present

## 2017-04-16 DIAGNOSIS — Z6836 Body mass index (BMI) 36.0-36.9, adult: Secondary | ICD-10-CM | POA: Diagnosis not present

## 2017-04-16 DIAGNOSIS — A150 Tuberculosis of lung: Secondary | ICD-10-CM

## 2017-04-16 DIAGNOSIS — E119 Type 2 diabetes mellitus without complications: Secondary | ICD-10-CM

## 2017-04-16 DIAGNOSIS — F17200 Nicotine dependence, unspecified, uncomplicated: Secondary | ICD-10-CM | POA: Diagnosis present

## 2017-04-16 DIAGNOSIS — Z794 Long term (current) use of insulin: Secondary | ICD-10-CM | POA: Diagnosis not present

## 2017-04-16 DIAGNOSIS — F312 Bipolar disorder, current episode manic severe with psychotic features: Secondary | ICD-10-CM | POA: Diagnosis not present

## 2017-04-16 DIAGNOSIS — E669 Obesity, unspecified: Secondary | ICD-10-CM | POA: Diagnosis present

## 2017-04-16 DIAGNOSIS — R451 Restlessness and agitation: Secondary | ICD-10-CM | POA: Diagnosis present

## 2017-04-16 DIAGNOSIS — Z818 Family history of other mental and behavioral disorders: Secondary | ICD-10-CM | POA: Diagnosis not present

## 2017-04-16 DIAGNOSIS — R4689 Other symptoms and signs involving appearance and behavior: Secondary | ICD-10-CM | POA: Diagnosis not present

## 2017-04-16 DIAGNOSIS — F209 Schizophrenia, unspecified: Secondary | ICD-10-CM | POA: Diagnosis not present

## 2017-04-16 DIAGNOSIS — F2089 Other schizophrenia: Secondary | ICD-10-CM | POA: Diagnosis not present

## 2017-04-16 DIAGNOSIS — G4730 Sleep apnea, unspecified: Secondary | ICD-10-CM | POA: Diagnosis present

## 2017-04-16 LAB — GLUCOSE, CAPILLARY
Glucose-Capillary: 158 mg/dL — ABNORMAL HIGH (ref 65–99)
Glucose-Capillary: 158 mg/dL — ABNORMAL HIGH (ref 65–99)

## 2017-04-16 MED ORDER — HYDROXYZINE HCL 50 MG PO TABS
50.0000 mg | ORAL_TABLET | Freq: Three times a day (TID) | ORAL | Status: DC | PRN
  Administered 2017-04-17 – 2017-04-27 (×3): 50 mg via ORAL
  Filled 2017-04-16 (×4): qty 1

## 2017-04-16 MED ORDER — MAGNESIUM HYDROXIDE 400 MG/5ML PO SUSP: 30.0000 mL | Freq: Every day | ORAL | Status: DC | PRN

## 2017-04-16 MED ORDER — METFORMIN HCL 500 MG PO TABS
1000.0000 mg | ORAL_TABLET | Freq: Two times a day (BID) | ORAL | Status: DC
  Administered 2017-04-17 – 2017-05-09 (×44): 1000 mg via ORAL
  Filled 2017-04-16 (×44): qty 2

## 2017-04-16 MED ORDER — ALUM & MAG HYDROXIDE-SIMETH 200-200-20 MG/5ML PO SUSP: 30.0000 mL | ORAL | Status: DC | PRN

## 2017-04-16 MED ORDER — DIVALPROEX SODIUM 500 MG PO DR TAB
750.0000 mg | DELAYED_RELEASE_TABLET | Freq: Two times a day (BID) | ORAL | Status: DC
  Administered 2017-04-17 – 2017-05-09 (×45): 750 mg via ORAL
  Filled 2017-04-16 (×46): qty 1

## 2017-04-16 MED ORDER — CLONAZEPAM 0.5 MG PO TABS
1.0000 mg | ORAL_TABLET | Freq: Once | ORAL | Status: AC
  Administered 2017-04-16: 0.5 mg via ORAL
  Filled 2017-04-16: qty 2

## 2017-04-16 MED ORDER — INSULIN GLARGINE 100 UNIT/ML ~~LOC~~ SOLN
10.0000 [IU] | Freq: Every day | SUBCUTANEOUS | Status: DC
  Administered 2017-04-17 – 2017-05-08 (×22): 10 [IU] via SUBCUTANEOUS
  Filled 2017-04-16 (×25): qty 0.1

## 2017-04-16 MED ORDER — PALIPERIDONE ER 3 MG PO TB24
6.0000 mg | ORAL_TABLET | Freq: Every day | ORAL | Status: DC
  Administered 2017-04-17 – 2017-04-21 (×5): 6 mg via ORAL
  Filled 2017-04-16 (×5): qty 2

## 2017-04-16 MED ORDER — ACETAMINOPHEN 325 MG PO TABS
650.0000 mg | ORAL_TABLET | Freq: Four times a day (QID) | ORAL | Status: DC | PRN
Start: 2017-04-16 — End: 2017-05-09
  Administered 2017-05-05: 650 mg via ORAL
  Filled 2017-04-16: qty 2

## 2017-04-16 MED ORDER — NICOTINE 21 MG/24HR TD PT24
MEDICATED_PATCH | TRANSDERMAL | Status: AC
  Administered 2017-04-16: 21 mg via TRANSDERMAL
  Filled 2017-04-16: qty 1

## 2017-04-16 MED ORDER — NICOTINE 21 MG/24HR TD PT24
21.0000 mg | MEDICATED_PATCH | Freq: Once | TRANSDERMAL | Status: DC
  Administered 2017-04-16: 21 mg via TRANSDERMAL

## 2017-04-16 MED ORDER — LITHIUM CARBONATE ER 450 MG PO TBCR
450.0000 mg | EXTENDED_RELEASE_TABLET | Freq: Two times a day (BID) | ORAL | Status: DC
  Administered 2017-04-17 – 2017-05-09 (×45): 450 mg via ORAL
  Filled 2017-04-16 (×46): qty 1

## 2017-04-16 MED ORDER — METOPROLOL SUCCINATE ER 25 MG PO TB24
50.0000 mg | ORAL_TABLET | Freq: Every day | ORAL | Status: DC
  Administered 2017-04-17 – 2017-05-09 (×23): 50 mg via ORAL
  Filled 2017-04-16 (×26): qty 2

## 2017-04-16 NOTE — ED Notes (Signed)
BEHAVIORAL HEALTH ROUNDING Patient sleeping: No. Patient alert and oriented: yes Behavior appropriate: Yes.  ; If no, describe:  Nutrition and fluids offered: yes Toileting and hygiene offered: Yes  Sitter present: q15 minute observations and security  monitoring Law enforcement present: Yes  ODS  

## 2017-04-16 NOTE — ED Notes (Signed)
Patient observed lying in bed with eyes closed  Even, unlabored respirations observed   NAD pt appears to be sleeping  I will continue to monitor along with every 15 minute visual observations and ongoing security monitoring    

## 2017-04-16 NOTE — Consult Note (Signed)
[facility] Face-to-Face Psychiatry Consult   Reason for Consult:  Consult for 51 year old man with a history of bipolar disorder brought in on commitment by his mother because of escalating mania Referring Physician:  Alfred Levins Patient Identification: Greg [name] MRN:  [id number] Principal Diagnosis: Bipolar affective disorder, currently manic, severe, with psychotic features [facility]) Diagnosis:   Patient Active Problem List   Diagnosis Date Noted  . Schizoaffective disorder ([place]) [F25.9] 03/28/2016  . Acute cholecystitis with chronic cholecystitis [K81.2] 03/27/2016  . Anxiety [F41.9]   . 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 Bipolar 2 disorder ([place]) [F31.81]   . Hyponatremia [E87.1] 10/06/2015  . Bipolar affective disorder, currently manic, severe, with psychotic features ([place]) [F31.2] 11/28/2014  . Essential hypertension [I10] 11/28/2014  . Dyslipidemia [E78.5] 11/28/2014  . Obesity [E66.9] 11/28/2014  . Diabetes ([place]) [E11.9] 11/28/2014  . Tobacco use disorder [F17.200] 11/28/2014    Total Time spent with patient: 20 minutes  Subjective:   Greg [name] is a 51 y.o. male patient admitted with "my parents keep the door locked".  Follow-up note for Tuesday the 18th. Patient seen chart reviewed. Patient has been in the emergency room overnight awaiting the opening of the bed for admission. On interview today the patient is polite alert oriented cooperative. I asked him whether he had slept last night and he giggled and told me "I can't even tell day from night anymore". He is sort of goofy and euphoric. Hasn't been violent or threatening. Has been compliant with medication. Blood sugars noted to still be modestly high but not excessive this morning. Blood pressure a little elevated but he should be back on his appropriate medicine. Patient is agreeable to a plan for admission to the hospital continues to have symptoms of mania evident.  HPI:  51 year old man with a history of  bipolar disorder. Mother filed commitment papers reporting that he had been violent or threatening to her. Patient is talking with me but is disorganized in his speech and a little hard to follow. He says that his parents always keep 2 doors locked in between their bedroom and where he is which frustrates his ability to get their attention. Therefore he has to pound hard on the doors and even walk around outside the house and pound on their windows when he wants to get their attention. He admits however that "things got out of hand" and that he was yelling and acting kind of crazy. Law enforcement cane and he stayed agitated. Patient admits that he's been feeling more manic and up recently. Not sleeping very well. Mood is very good. He talks about how he is studying to become the President of the [place] and that everything he does is "to save the little babies". He claims that he is taking his medicine as prescribed and his blood levels do not look reasonably good. Denies that he's been drinking or using any drugs. Denies any particular new stresses. Patient denies any thoughts of wanting to hurt anyone else. He is vague about hallucinations. Mother says that behaviors have been worsening over at least a week.  Social history: Patient is disabled and lives with his parents. Divorced. Little social contact outside his home.  Medical history: Diabetes and high blood pressure dyslipidemia  Substance abuse history: No major substance abuse problems no serious problems with alcohol or drugs in the past. Patient himself says he used to be "an alcoholic" but says that that means that he used to drink a couple of beers a  day which she no longer does.  Past Psychiatric History: Multiple prior hospitalizations he estimates over 11. The last time he was here it was also an episode of mania which tends to be his presentation. Just like this time he at that time seemed to be compliant with his medicine but for  several recent still decompensated. He does have a distant past history of suicide attempts denies ever being seriously violent. It escalated to the point of agitation though when he is manic  Risk to Self: Suicidal Ideation: No Suicidal Intent: No Is patient at risk for suicide?: No Suicidal Plan?: No Access to Means: No What has been your use of drugs/alcohol within the last 12 months?: occassional use of alcohol How many times?: 0 Other Self Harm Risks: Denied Triggers for Past Attempts: None known Intentional Self Injurious Behavior: None Risk to Others: Homicidal Ideation: No Thoughts of Harm to Others: No Current Homicidal Intent: No Current Homicidal Plan: No Access to Homicidal Means: No Identified Victim: None identified History of harm to others?: No Assessment of Violence: None Noted Violent Behavior Description: Denied by patient Does patient have access to weapons?: No Criminal Charges Pending?: No Does patient have a court date: No Prior Inpatient Therapy: Prior Inpatient Therapy: Yes Prior Therapy Dates: 2017 and prior Prior Therapy Facilty/Provider(s): [place], [place] Reason for Treatment: Schizophrenia, Bipolar Disorder Prior Outpatient Therapy: Prior Outpatient Therapy: Yes Prior Therapy Dates: Current Prior Therapy Facilty/Provider(s): [facility] Reason for Treatment: Bipolar Disorder Does patient have an ACCT team?: No Does patient have Intensive In-House Services?  : No Does patient have [facility] services? : Yes Does patient have P4CC services?: No  Past Medical History:  Past Medical History:  Diagnosis Date  . Bipolar 1 disorder ([place])   . Diabetes ([place])   . Dyslipidemia   . HTN (hypertension)   . Obesity   . Paranoia ([place])   . Schizo affective schizophrenia [facility])     Past Surgical History:  Procedure Laterality Date  . APPENDECTOMY  51 years old  . CHOLECYSTECTOMY N/A 03/30/2016   Procedure: LAPAROSCOPIC CHOLECYSTECTOMY;  Surgeon: Hubbard Robinson,  MD;  Location: [facility] ORS;  Service: General;  Laterality: N/A;  . TONSILLECTOMY  51 year old   Family History:  Family History  Problem Relation Age of Onset  . Depression Mother   . Mental illness Mother   . Depression Father   . Mental illness Father   . Depression Sister   . Mental illness Sister    Family Psychiatric  History: Possible family history of mood disorder Social History:  History  Alcohol Use No     History  Drug Use No    Social History   Social History  . Marital status: Single    Spouse name: N/A  . Number of children: N/A  . Years of education: N/A   Social History Main Topics  . Smoking status: Current Every Day Smoker    Packs/day: 0.50    Years: 8.00    Types: Cigarettes  . Smokeless tobacco: Current User    Types: Snuff  . Alcohol use No  . Drug use: No  . Sexual activity: No   Other Topics Concern  . None   Social History Narrative  . None   Additional Social History:    Allergies:   Allergies  Allergen Reactions  . Haldol [Haloperidol] Anaphylaxis  . Seroquel [Quetiapine Fumarate] Other (See Comments)    Reaction: morbid, depressed    Labs:  Results for  orders placed or performed during the hospital encounter of 04/14/17 (from the past 48 hour(s))  Comprehensive metabolic panel     Status: Abnormal   Collection Time: 04/14/17  6:48 PM  Result Value Ref Range   Sodium 129 (L) 135 - 145 mmol/L   Potassium 4.0 3.5 - 5.1 mmol/L   Chloride 96 (L) 101 - 111 mmol/L   CO2 24 22 - 32 mmol/L   Glucose, Bld 324 (H) 65 - 99 mg/dL   BUN 17 6 - 20 mg/dL   Creatinine, Ser 0.85 0.61 - 1.24 mg/dL   Calcium 8.8 (L) 8.9 - 10.3 mg/dL   Total Protein 7.1 6.5 - 8.1 g/dL   Albumin 3.4 (L) 3.5 - 5.0 g/dL   AST 25 15 - 41 U/L   ALT 20 17 - 63 U/L   Alkaline Phosphatase 56 38 - 126 U/L   Total Bilirubin 0.4 0.3 - 1.2 mg/dL   GFR calc non Af Amer >60 >60 mL/min   GFR calc Af Amer >60 >60 mL/min    Comment: (NOTE) The eGFR has been  calculated using the CKD EPI equation. This calculation has not been validated in all clinical situations. eGFR's persistently <60 mL/min signify possible Chronic Kidney Disease.    Anion gap 9 5 - 15  Ethanol     Status: None   Collection Time: 04/14/17  6:48 PM  Result Value Ref Range   Alcohol, Ethyl (B) <5 <5 mg/dL    Comment:        LOWEST DETECTABLE LIMIT FOR SERUM ALCOHOL IS 5 mg/dL FOR MEDICAL PURPOSES ONLY   cbc     Status: Abnormal   Collection Time: 04/14/17  6:48 PM  Result Value Ref Range   WBC 9.5 3.8 - 10.6 K/uL   RBC 4.59 4.40 - 5.90 MIL/uL   Hemoglobin 13.1 13.0 - 18.0 g/dL   HCT 38.7 (L) 40.0 - 52.0 %   MCV 84.3 80.0 - 100.0 fL   MCH 28.5 26.0 - 34.0 pg   MCHC 33.8 32.0 - 36.0 g/dL   RDW 13.4 11.5 - 14.5 %   Platelets 249 150 - 440 K/uL  Urine Drug Screen, Qualitative     Status: None   Collection Time: 04/14/17  6:48 PM  Result Value Ref Range   Tricyclic, Ur Screen NONE DETECTED NONE DETECTED   Amphetamines, Ur Screen NONE DETECTED NONE DETECTED   MDMA (Ecstasy)Ur Screen NONE DETECTED NONE DETECTED   Cocaine Metabolite,Ur [place] NONE DETECTED NONE DETECTED   Opiate, Ur Screen NONE DETECTED NONE DETECTED   Phencyclidine (PCP) Ur S NONE DETECTED NONE DETECTED   Cannabinoid 50 Ng, Ur [place] NONE DETECTED NONE DETECTED   Barbiturates, Ur Screen NONE DETECTED NONE DETECTED   Benzodiazepine, Ur Scrn NONE DETECTED NONE DETECTED   Methadone Scn, Ur NONE DETECTED NONE DETECTED    Comment: (NOTE) 161  Tricyclics, urine               Cutoff 1000 ng/mL 200  Amphetamines, urine             Cutoff 1000 ng/mL 300  MDMA (Ecstasy), urine           Cutoff 500 ng/mL 400  Cocaine Metabolite, urine       Cutoff 300 ng/mL 500  Opiate, urine                   Cutoff 300 ng/mL 600  Phencyclidine (PCP), urine  Cutoff 25 ng/mL 700  Cannabinoid, urine              Cutoff 50 ng/mL 800  Barbiturates, urine             Cutoff 200 ng/mL 900  Benzodiazepine, urine           Cutoff  200 ng/mL 1000 Methadone, urine                Cutoff 300 ng/mL 1100 1200 The urine drug screen provides only a preliminary, unconfirmed 1300 analytical test result and should not be used for non-medical 1400 purposes. Clinical consideration and professional judgment should 1500 be applied to any positive drug screen result due to possible 1600 interfering substances. A more specific alternate chemical method 1700 must be used in order to obtain a confirmed analytical result.  1800 Gas chromato graphy / mass spectrometry (GC/MS) is the preferred 1900 confirmatory method.   Lithium level     Status: None   Collection Time: 04/14/17  6:48 PM  Result Value Ref Range   Lithium Lvl 0.74 0.60 - 1.20 mmol/L  Valproic acid level     Status: Abnormal   Collection Time: 04/14/17  6:48 PM  Result Value Ref Range   Valproic Acid Lvl 32 (L) 50.0 - 100.0 ug/mL  Glucose, capillary     Status: Abnormal   Collection Time: 04/15/17  7:50 AM  Result Value Ref Range   Glucose-Capillary 146 (H) 65 - 99 mg/dL  Basic metabolic panel     Status: Abnormal   Collection Time: 04/15/17 12:01 PM  Result Value Ref Range   Sodium 135 135 - 145 mmol/L   Potassium 4.5 3.5 - 5.1 mmol/L   Chloride 103 101 - 111 mmol/L   CO2 26 22 - 32 mmol/L   Glucose, Bld 190 (H) 65 - 99 mg/dL   BUN 12 6 - 20 mg/dL   Creatinine, Ser 0.84 0.61 - 1.24 mg/dL   Calcium 8.9 8.9 - 10.3 mg/dL   GFR calc non Af Amer >60 >60 mL/min   GFR calc Af Amer >60 >60 mL/min    Comment: (NOTE) The eGFR has been calculated using the CKD EPI equation. This calculation has not been validated in all clinical situations. eGFR's persistently <60 mL/min signify possible Chronic Kidney Disease.    Anion gap 6 5 - 15  Blood gas, arterial     Status: Abnormal   Collection Time: 04/15/17 12:40 PM  Result Value Ref Range   FIO2 0.21    pH, Arterial 7.41 7.350 - 7.450   pCO2 arterial 41 32.0 - 48.0 mmHg   pO2, Arterial 76 (L) 83.0 - 108.0 mmHg    Bicarbonate 26.0 20.0 - 28.0 mmol/L   Acid-Base Excess 1.2 0.0 - 2.0 mmol/L   O2 Saturation 95.2 %   Patient temperature 37.0    Collection site LEFT RADIAL    Sample type ARTERIAL DRAW    Allens test (pass/fail) PASS PASS  Glucose, capillary     Status: Abnormal   Collection Time: 04/15/17  1:46 PM  Result Value Ref Range   Glucose-Capillary 179 (H) 65 - 99 mg/dL  Glucose, capillary     Status: Abnormal   Collection Time: 04/15/17 10:06 PM  Result Value Ref Range   Glucose-Capillary 183 (H) 65 - 99 mg/dL  Urinalysis, Complete w Microscopic     Status: Abnormal   Collection Time: 04/15/17 10:07 PM  Result Value Ref Range   Color, Urine  STRAW (A) YELLOW   APPearance CLEAR (A) CLEAR   Specific Gravity, Urine 1.009 1.005 - 1.030   pH 7.0 5.0 - 8.0   Glucose, UA NEGATIVE NEGATIVE mg/dL   Hgb urine dipstick NEGATIVE NEGATIVE   Bilirubin Urine NEGATIVE NEGATIVE   Ketones, ur NEGATIVE NEGATIVE mg/dL   Protein, ur NEGATIVE NEGATIVE mg/dL   Nitrite NEGATIVE NEGATIVE   Leukocytes, UA NEGATIVE NEGATIVE   RBC / HPF NONE SEEN 0 - 5 RBC/hpf   WBC, UA 0-5 0 - 5 WBC/hpf   Bacteria, UA NONE SEEN NONE SEEN   Squamous Epithelial / LPF NONE SEEN NONE SEEN   Mucus PRESENT   Blood gas, venous     Status: Abnormal   Collection Time: 04/15/17 10:12 PM  Result Value Ref Range   pH, Ven 7.43 7.250 - 7.430   pCO2, Ven 40 (L) 44.0 - 60.0 mmHg   pO2, Ven 57.0 (H) 32.0 - 45.0 mmHg   Bicarbonate 26.5 20.0 - 28.0 mmol/L   Acid-Base Excess 2.0 0.0 - 2.0 mmol/L   O2 Saturation 90.1 %   Patient temperature 37.0    Collection site LINE    Sample type VENOUS   Glucose, capillary     Status: Abnormal   Collection Time: 04/16/17  9:10 AM  Result Value Ref Range   Glucose-Capillary 158 (H) 65 - 99 mg/dL   Comment 1 Notify RN     Current Facility-Administered Medications  Medication Dose Route Frequency Provider Last Rate Last Dose  . divalproex (DEPAKOTE) DR tablet 750 mg  750 mg Oral Q12H Merlyn Lot, MD   750 mg at 04/16/17 0942  . insulin glargine (LANTUS) injection 10 Units  10 Units Subcutaneous Daily Merlyn Lot, MD   10 Units at 04/14/17 2239  . lithium carbonate (ESKALITH) CR tablet 450 mg  450 mg Oral Q12H Merlyn Lot, MD   450 mg at 04/16/17 0943  . metFORMIN (GLUCOPHAGE) tablet 1,000 mg  1,000 mg Oral BID WC Merlyn Lot, MD   1,000 mg at 04/16/17 0941  . metoprolol succinate (TOPROL-XL) 24 hr tablet 50 mg  50 mg Oral Daily Merlyn Lot, MD   50 mg at 04/16/17 0944  . paliperidone (INVEGA) 24 hr tablet 6 mg  6 mg Oral QHS Merlyn Lot, MD   6 mg at 04/14/17 2238   Current Outpatient Prescriptions  Medication Sig Dispense Refill  . amantadine (SYMMETREL) 100 MG capsule Take 100 mg by mouth 2 (two) times daily.    . benztropine (COGENTIN) 2 MG tablet Take 1 tablet by mouth daily.    . divalproex (DEPAKOTE) 250 MG DR tablet Take 3 tablets (750 mg total) by mouth every 12 (twelve) hours. 180 tablet 0  . lithium carbonate 300 MG capsule Take 300 mg by mouth 3 (three) times daily.    . propranolol (INDERAL) 20 MG tablet Take 20 mg by mouth 2 (two) times daily.    . insulin glargine (LANTUS) 100 UNIT/ML injection Inject 0.1 mLs (10 Units total) into the skin daily. 10 mL 0  . lithium carbonate (ESKALITH) 450 MG CR tablet Take 450 mg by mouth 2 (two) times daily.    . metFORMIN (GLUCOPHAGE) 1000 MG tablet Take 1 tablet (1,000 mg total) by mouth 2 (two) times daily with a meal. 60 tablet 0  . metoprolol succinate (TOPROL-XL) 50 MG 24 hr tablet Take 1 tablet (50 mg total) by mouth daily. Take with or immediately following a meal. 30 tablet 0  Musculoskeletal: Strength & Muscle Tone: within normal limits Gait & Station: normal Patient leans: N/A  Psychiatric Specialty Exam: Physical Exam  Nursing note and vitals reviewed. Constitutional: He appears well-developed and well-nourished.  HENT:  Head: Normocephalic and atraumatic.  Eyes: Pupils are  equal, round, and reactive to light. Conjunctivae are normal.  Neck: Normal range of motion.  Cardiovascular: Normal heart sounds.   Respiratory: Effort normal.  GI: Soft.  Musculoskeletal: Normal range of motion.  Neurological: He is alert.  Skin: Skin is warm and dry.  Psychiatric: His affect is labile and inappropriate. His speech is tangential. Thought content is delusional. Thought content is not paranoid. Cognition and memory are impaired. He expresses impulsivity. He expresses no homicidal and no suicidal ideation. He is inattentive.    Review of Systems  Constitutional: Negative.   HENT: Negative.   Eyes: Negative.   Respiratory: Negative.   Cardiovascular: Negative.   Gastrointestinal: Negative.   Musculoskeletal: Negative.   Skin: Negative.   Neurological: Negative.   Psychiatric/Behavioral: Negative for depression, hallucinations, memory loss, substance abuse and suicidal ideas. The patient has insomnia. The patient is not nervous/anxious.     Blood pressure (!) 153/71, pulse 90, temperature 99 F (37.2 C), resp. rate 20, height _0  (1.753 m), weight 117.9 kg (260 lb), SpO2 98 %.Body mass index is 38.4 kg/m.  General Appearance: Disheveled  Eye Contact:  Fair  Speech:  Garbled  Volume:  Normal  Mood:  Euphoric  Affect:  Congruent  Thought Process:  Disorganized  Orientation:  Full (Time, Place, and Person)  Thought Content:  Illogical, Delusions and Paranoid Ideation  Suicidal Thoughts:  No  Homicidal Thoughts:  No  Memory:  Immediate;   Good Recent;   Fair Remote;   Fair  Judgement:  Impaired  Insight:  Shallow  Psychomotor Activity:  Normal  Concentration:  Concentration: Fair  Recall:  [employer] of Knowledge:  Fair  Language:  Fair  Akathisia:  No  Handed:  Right  AIMS (if indicated):     Assets:  Communication Skills Desire for Improvement Financial Resources/Insurance Housing Resilience Social Support  ADL's:  Impaired  Cognition:  Impaired,   Mild  Sleep:        Treatment Plan Summary: Medication management and Plan Orders in place and plan in place for admission to the psychiatric ward. No change to current medication. Full set of labs be pending his admission. Supportive counseling and review of plan with the patient who is agreeable. Case reviewed with staff and physician downstairs who are familiar with the patient.  Disposition: Recommend psychiatric Inpatient admission when medically cleared. Supportive therapy provided about ongoing stressors.  Alethia Berthold, MD 04/16/2017 11:20 AM

## 2017-04-16 NOTE — ED Notes (Signed)
Patient requested toothbrush and toothpaste. Toiletries provided. Patient back to room and currently sleeping soundly.

## 2017-04-16 NOTE — ED Notes (Signed)
We continue to await bed to be assigned on inpt unit    BEHAVIORAL HEALTH ROUNDING Patient sleeping: No. Patient alert and oriented: yes Behavior appropriate: Yes.  ; If no, describe:  Nutrition and fluids offered: yes Toileting and hygiene offered: Yes  Sitter present: q15 minute observations and security monitoring Law enforcement present: Yes  ODS

## 2017-04-16 NOTE — ED Notes (Signed)
Patient has been accepted to [facility].  Patient assigned to room [facility] C unit  Accepting physician is Dr. Betti Cruz.  Call report to [phone].  Representative was [place].  ER Staff is aware of it [facility] Sect.; Dr. Lenard Lance, ER MD & Victorino Dike Patient's Nurse)    Patient can arrive after 7:00 a.m.

## 2017-04-16 NOTE — ED Provider Notes (Signed)
-----------------------------------------   5:42 AM on 04/16/2017 -----------------------------------------   Blood pressure (!) 153/71, pulse 90, temperature 100.3 F (37.9 C), temperature source Oral, resp. rate 20, height  (1.753 m), weight 117.9 kg (260 lb), SpO2 98 %.  The patient had no acute events since last update.  Calm and cooperative at this time.  Patient has been accepted to [facility]. Patient will be transported at some point this morning.   Minna Antis, MD 04/16/17 [phone]

## 2017-04-16 NOTE — BH Assessment (Signed)
Patient is to be admitted to [facility] by Dr. Toni Amend.  Attending Physician will be Dr. Jennet Maduro.   Patient has been assigned to room 323, by [facility] Charge Nurse [place].   Intake Paper Work has been signed and placed on patient chart.  ER staff is aware of the admission Greg [name] ER Sect.;  Victorino Dike Patient's Nurse & Janelle Patient Access).

## 2017-04-16 NOTE — BH Assessment (Addendum)
Per Dr.[name], pt is to be admitted to [facility] and not transferred out of system for treatment. Clinician informed pt RN (Amy) who states transportation has already been contacted by ED Secretary Misty Stanley) and therefore secretary should be contacted so that she may provide RN with directions on how to proceed. RN also states EDP and psych ED should communicate regarding pt's disposition, opposed to clinician and RN. Clinician informed Dr.[name] who states he will contact EDP Dr.Stafford. Clinician informed ED secretary Misty Stanley) as well.

## 2017-04-16 NOTE — ED Notes (Signed)
ED  Is the patient under IVC or is there intent for IVC: Yes.   Is the patient medically cleared: Yes.   Is there vacancy in the ED BHU: Yes.   Is the population mix appropriate for patient: Yes.   Is the patient awaiting placement in inpatient or outpatient setting: inpt admission bed to be assigned   Has the patient had a psychiatric consult: Yes.   Survey of unit performed for contraband, proper placement and condition of furniture, tampering with fixtures in bathroom, shower, and each patient room: Yes.  ; Findings:  APPEARANCE/BEHAVIOR Calm and cooperative NEURO ASSESSMENT Orientation: oriented x4  Denies pain Hallucinations: denies at this time (Hallucinations) Speech: Normal Gait: normal RESPIRATORY ASSESSMENT Even  Unlabored respirations  CARDIOVASCULAR ASSESSMENT Pulses equal   regular rate  Skin warm and dry   GASTROINTESTINAL ASSESSMENT no GI complaint EXTREMITIES Full ROM  PLAN OF CARE Provide calm/safe environment. Vital signs assessed twice daily. ED BHU Assessment once each 12-hour shift. Collaborate with TTS  daily or as condition indicates. Assure the ED provider has rounded once each shift. Provide and encourage hygiene. Provide redirection as needed. Assess for escalating behavior; address immediately and inform ED provider.  Assess family dynamic and appropriateness for visitation as needed: Yes.  ; If necessary, describe findings:  Educate the patient/family about BHU procedures/visitation: Yes.  ; If necessary, describe findings:

## 2017-04-16 NOTE — ED Notes (Signed)
He continues to await bed assignment on the [facility]     Appropriate to stimulation  No verbalized needs or concerns at this time  NAD assessed  Continue to monitor

## 2017-04-16 NOTE — BH Assessment (Addendum)
Pt pending BMU bed assignment. 

## 2017-04-16 NOTE — ED Provider Notes (Signed)
notified by psychiatry the patient now has an available psychiatry bed here at [facility] and does not require transfer as this facility is able to care for the patient adequately.   Sharman Cheek, MD 04/16/17 1021

## 2017-04-16 NOTE — ED Notes (Signed)
Patient's Lithium not available at this time from pharmacy.

## 2017-04-16 NOTE — BH Assessment (Signed)
[facility] [phone]) informed pt is no longer in need of placement.

## 2017-04-16 NOTE — ED Notes (Signed)
Report called to [facility] - transport has already been called - EDP has completed EMTALA as have I  - received a call from TTS stating that the pt may not transfer due to a doctor in [facility] has accepted his care and he will be admitted  - per TTS unknown amount of time before bed to be assigned  - pt has been here 40 hours and it appears his care will be delayed longer due to waiting for a bed to be assigned

## 2017-04-16 NOTE — ED Notes (Signed)
Patient prescribed  Klonopin for anxiety, pacing floor, sweating. When taken to patient, patient asked to take half dose d/t going down stairs and didn't want to be too sedated. Notified pt that he can have other half if needed.

## 2017-04-16 NOTE — ED Notes (Addendum)

## 2017-04-17 ENCOUNTER — Encounter: Payer: Self-pay | Admitting: Psychiatry

## 2017-04-17 DIAGNOSIS — F250 Schizoaffective disorder, bipolar type: Principal | ICD-10-CM

## 2017-04-17 LAB — LIPID PANEL
Cholesterol: 103 mg/dL (ref 0–200)
HDL: 34 mg/dL — ABNORMAL LOW (ref >40)
LDL Cholesterol: 47 mg/dL (ref 0–99)
Total CHOL/HDL Ratio: 3 RATIO
Triglycerides: 110 mg/dL (ref <150)
VLDL: 22 mg/dL (ref 0–40)

## 2017-04-17 LAB — HEMOGLOBIN A1C
Hgb A1c MFr Bld: 10.9 % — ABNORMAL HIGH (ref 4.8–5.6)
Mean Plasma Glucose: 266.13 mg/dL

## 2017-04-17 LAB — GLUCOSE, CAPILLARY
Glucose-Capillary: 147 mg/dL — ABNORMAL HIGH (ref 65–99)
Glucose-Capillary: 223 mg/dL — ABNORMAL HIGH (ref 65–99)

## 2017-04-17 LAB — TSH: TSH: 2.733 u[IU]/mL (ref 0.350–4.500)

## 2017-04-17 MED ORDER — INFLUENZA VAC SPLIT QUAD 0.5 ML IM SUSY
0.5000 mL | PREFILLED_SYRINGE | INTRAMUSCULAR | Status: AC
  Administered 2017-04-18: 0.5 mL via INTRAMUSCULAR
  Filled 2017-04-17: qty 0.5

## 2017-04-17 MED ORDER — PALIPERIDONE PALMITATE 234 MG/1.5ML IM SUSP
234.0000 mg | INTRAMUSCULAR | Status: DC
  Filled 2017-04-17 (×2): qty 1.5

## 2017-04-17 MED ORDER — NICOTINE 21 MG/24HR TD PT24
21.0000 mg | MEDICATED_PATCH | Freq: Every day | TRANSDERMAL | Status: DC
  Administered 2017-04-17 – 2017-05-09 (×23): 21 mg via TRANSDERMAL
  Filled 2017-04-17 (×24): qty 1

## 2017-04-17 MED ORDER — TEMAZEPAM 15 MG PO CAPS
30.0000 mg | ORAL_CAPSULE | Freq: Every day | ORAL | Status: DC
  Administered 2017-04-17 – 2017-04-20 (×4): 30 mg via ORAL
  Filled 2017-04-17: qty 2
  Filled 2017-04-17: qty 4
  Filled 2017-04-17 (×2): qty 2

## 2017-04-17 MED ORDER — PNEUMOCOCCAL VAC POLYVALENT 25 MCG/0.5ML IJ INJ
0.5000 mL | INJECTION | INTRAMUSCULAR | Status: DC
  Filled 2017-04-17 (×2): qty 0.5

## 2017-04-17 NOTE — [facility] Suicide Risk Assessment (Signed)
[facility] Admission Suicide Risk Assessment   Nursing information obtained from:    Demographic factors:    Current Mental Status:    Loss Factors:    Historical Factors:    Risk Reduction Factors:     Total Time spent with patient: 1 hour Principal Problem: Schizoaffective disorder, bipolar type (HCC) Diagnosis:   Patient Active Problem List   Diagnosis Date Noted  . Schizoaffective disorder, bipolar type (HCC) [F25.0] 03/28/2016  . Acute cholecystitis with chronic cholecystitis [K81.2] 03/27/2016  . Anxiety [F41.9]   . 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 Bipolar 2 disorder (HCC) [F31.81]   . Hyponatremia [E87.1] 10/06/2015  . Essential hypertension [I10] 11/28/2014  . Dyslipidemia [E78.5] 11/28/2014  . Obesity [E66.9] 11/28/2014  . Diabetes (HCC) [E11.9] 11/28/2014  . Tobacco use disorder [F17.200] 11/28/2014   Subjective Data: psychotic break.  Continued Clinical Symptoms:  Alcohol Use Disorder Identification Test Final Score (AUDIT): 0 The "Alcohol Use Disorders Identification Test", Guidelines for Use in Primary Care, Second Edition.  World Science writer [facility]). Score between 0-7:  no or low risk or alcohol related problems. Score between 8-15:  moderate risk of alcohol related problems. Score between 16-19:  high risk of alcohol related problems. Score 20 or above:  warrants further diagnostic evaluation for alcohol dependence and treatment.   CLINICAL FACTORS:   Bipolar Disorder:   Mixed State Schizophrenia:   Paranoid or undifferentiated type Currently Psychotic Previous Psychiatric Diagnoses and Treatments   Musculoskeletal: Strength & Muscle Tone: within normal limits Gait & Station: normal Patient leans: N/A  Psychiatric Specialty Exam: Physical Exam  Nursing note and vitals reviewed. Psychiatric: His affect is inappropriate. His speech is rapid and/or pressured. He is hyperactive. Thought content is paranoid and delusional. Cognition and memory  are normal. He expresses impulsivity.    Review of Systems  Constitutional: Negative.   HENT: Negative.   Eyes: Negative.   Respiratory: Negative.   Cardiovascular: Positive for leg swelling.  Gastrointestinal: Negative.   Genitourinary: Negative.   Musculoskeletal: Negative.   Skin: Negative.   Neurological: Negative.   Endo/Heme/Allergies: Negative.   Psychiatric/Behavioral: The patient has insomnia.     Blood pressure (!) 147/72, pulse 84, temperature 98.6 F (37 C), temperature source Oral, resp. rate 20, height  (1.753 m), weight 111.6 kg (246 lb), SpO2 100 %.Body mass index is 36.33 kg/m.  General Appearance: Bizarre  Eye Contact:  Good  Speech:  Pressured  Volume:  Increased  Mood:  Euphoric  Affect:  Congruent  Thought Process:  Disorganized and Descriptions of Associations: Loose  Orientation:  Full (Time, Place, and Person)  Thought Content:  Delusions and Paranoid Ideation  Suicidal Thoughts:  No  Homicidal Thoughts:  No  Memory:  Immediate;   Fair Recent;   Fair Remote;   Fair  Judgement:  Poor  Insight:  Lacking  Psychomotor Activity:  Increased  Concentration:  Concentration: Fair and Attention Span: Fair  Recall:  [employer] of Knowledge:  Fair  Language:  Fair  Akathisia:  No  Handed:  Right  AIMS (if indicated):     Assets:  Communication Skills Desire for Improvement Financial Resources/Insurance Housing Physical Health Resilience Social Support  ADL's:  Intact  Cognition:  WNL  Sleep:  Number of Hours: 7.45      COGNITIVE FEATURES THAT CONTRIBUTE TO RISK:  None    SUICIDE RISK:   Moderate:  Frequent suicidal ideation with limited intensity, and duration, some specificity in terms of plans,  no associated intent, good self-control, limited dysphoria/symptomatology, some risk factors present, and identifiable protective factors, including available and accessible social support.  PLAN OF CARE: Hospital admission, medication  management, discharge planning.  Mr. Donahue is a 51 year old male with history of schizoaffective disorder admitted floridly manic in spite of reasonable medication compliance.  1. Mood and psychosis. He has been maintained on a combination of oral Invega, lithium, and Depakote. Will continue all his medications. The patient agreed to [place] injection. We will consider clozapine.  2. Insomnia. We will start Restoril.  3. Hypertension. He is on metoprolol.   4. Diabetes. He is on ADA diet, Lantus and metformin with blood glucose monitoring.   5. Smoking. Nicotine patch is available.  6. Sleep apnea. He is on CPAP.  7. Metabolic syndrome monitoring. Lipid panel, TSH, hemoglobin A1c are pending.  8. EKG. Pending.  9. Social. He lives with her elderly parents who no longer feel they can provide care. They will need advice on guardianship process and placement.  10. Disposition. To be established. He will likely be discharged back to his parents. He will follow up with [facility].   I certify that inpatient services furnished can reasonably be expected to improve the patient's condition.   Kristine Linea, MD 04/17/2017, 4:33 PM

## 2017-04-17 NOTE — Tx Team (Signed)
Initial Treatment Plan 04/17/2017 2:14 AM Greg [name] ZOX:[id number]    PATIENT STRESSORS: Financial difficulties Health problems Medication change or noncompliance   PATIENT STRENGTHS: Ability for insight Capable of independent living Supportive family/friends   PATIENT IDENTIFIED PROBLEMS: Mood Instability  Odd and Bizarre Behavior: Magical Thinking, Grandiose  Anger & Aggression                 DISCHARGE CRITERIA:  Improved stabilization in mood, thinking, and/or behavior Motivation to continue treatment in a less acute level of care Verbal commitment to aftercare and medication compliance  PRELIMINARY DISCHARGE PLAN: Outpatient therapy Return to previous living arrangement  PATIENT/FAMILY INVOLVEMENT: This treatment plan has been presented to and reviewed with the patient, Greg [name].  The patient and family have been given the opportunity to ask questions and make suggestions.  Cleotis Nipper, RN 04/17/2017, 2:14 AM

## 2017-04-17 NOTE — Progress Notes (Signed)
Patient ID: Greg [name], male   DOB: 1965/09/17, 51 y.o.   MRN: [id number] LCSW Group Therapy Note  04/17/2017 3pm  Type of Therapy/Topic:  Group Therapy:  Emotion Regulation  Participation Level:  Did Not Attend   Description of Group:   The purpose of this group is to assist patients in learning to regulate negative emotions and experience positive emotions. Patients will be guided to discuss ways in which they have been vulnerable to their negative emotions. These vulnerabilities will be juxtaposed with experiences of positive emotions or situations, and patients will be challenged to use positive emotions to combat negative ones. Special emphasis will be placed on coping with negative emotions in conflict situations, and patients will process healthy conflict resolution skills.  Therapeutic Goals: 1. Patient will identify two positive emotions or experiences to reflect on in order to balance out negative emotions 2. Patient will label two or more emotions that they find the most difficult to experience 3. Patient will demonstrate positive conflict resolution skills through discussion and/or role plays  Summary of Patient Progress:       Therapeutic Modalities:   Cognitive Behavioral Therapy Feelings Identification Dialectical Behavioral Therapy   Glennon Mac, LCSW 04/17/2017 5:40 PM

## 2017-04-17 NOTE — [facility] Group Notes (Signed)
[facility] Group Notes:  (Nursing/MHT/Case Management/Adjunct)  Date:  04/17/2017  Time:  11:27 AM  Type of Therapy:  Psychoeducational Skills  Participation Level:  Active  Participation Quality:  Appropriate and Attentive  Affect:  Appropriate  Cognitive:  Appropriate  Insight:  Appropriate  Engagement in Group:  Developing/Improving  Modes of Intervention:  Discussion, Exploration and Problem-solving  Summary of Progress/Problems:  Foy Guadalajara 04/17/2017, 11:27 AM

## 2017-04-17 NOTE — Progress Notes (Signed)
Patient ID: Greg [name], male   DOB: 22-Aug-1965, 50 y.o.   MRN: [id number] Admission Note: Admitted to [facility] x3 in 2016, x2 in 2017, now under IVC petitioned by mother, Ms Shota Kohrs, for mania, agitation and violent behaviors; mother reported that "he has been accelerating for a week plus, delusional, thinks he is God, saving babies, running for President of [place], placing flags and items in the front yard, banging on door threatening to tear it down." Past psych diagnosis of Schizoaffective d/o, Bipolar type; considered disable. UDS negative, no illicit  Concrete thinking, disorganized, loose association, tangential, blaming others, delusional and grandiose, evasive and making up words "proficiency is success, madness is insanity." Skin assessment: Gross skin intact except for scattered rashes noticeable on his head and neck; bilateral ankle edema, tight skin, "hawk or [name]" type of haircut; disheveled, poor hygiene, malodorous, scratching himself. Vistaril given, CBG=158 on arrival; Hygiene products provided, showered before we continued with Contraband Search:  Completed with Ms Chilton Si, RN. No contraband found on patient and in his belongings.

## 2017-04-17 NOTE — Progress Notes (Signed)
Pleasant and cooperative.  Speech is tangential.  [employer]. Interacts with peers and staff appropriately. Support and encouragement offered.  Denies SI/HI/AVH.  Scheduled medications given.  Safety rounds maintained.

## 2017-04-17 NOTE — Tx Team (Signed)
Interdisciplinary Treatment and Diagnostic Plan Update  04/17/2017 Time of Session: 10:30am Mildred [name] MRN: [id number]  Principal Diagnosis: Bipolar affective disorder, currently manic, severe, with psychotic features (HCC)  Secondary Diagnoses: Principal Problem:   Bipolar affective disorder, currently manic, severe, with psychotic features (HCC) Active Problems:   Essential hypertension   Obesity   Diabetes (HCC)   Tobacco use disorder   Benign familial tremor   Current Medications:  Current Facility-Administered Medications  Medication Dose Route Frequency Provider Last Rate Last Dose  . acetaminophen (TYLENOL) tablet 650 mg  650 mg Oral Q6H PRN [name], [name], MD      . alum & mag hydroxide-simeth (MAALOX/MYLANTA) 200-200-20 MG/5ML suspension 30 mL  30 mL Oral Q4H PRN [name], [name], MD      . divalproex (DEPAKOTE) DR tablet 750 mg  750 mg Oral Q12H [name], [name], MD   750 mg at 04/17/17 0859  . hydrOXYzine (ATARAX/VISTARIL) tablet 50 mg  50 mg Oral TID PRN [name], [name], MD   50 mg at 04/17/17 0039  . [START ON 04/18/2017] Influenza vac split quadrivalent PF (FLUARIX) injection 0.5 mL  0.5 mL Intramuscular Tomorrow-1000 Pucilowska, Jolanta B, MD      . insulin glargine (LANTUS) injection 10 Units  10 Units Subcutaneous Daily [name], [name], MD      . lithium carbonate (ESKALITH) CR tablet 450 mg  450 mg Oral Q12H [name], [name], MD   450 mg at 04/17/17 0859  . magnesium hydroxide (MILK OF MAGNESIA) suspension 30 mL  30 mL Oral Daily PRN [name], [name], MD      . metFORMIN (GLUCOPHAGE) tablet 1,000 mg  1,000 mg Oral BID WC [name], [name], MD   1,000 mg at 04/17/17 0859  . metoprolol succinate (TOPROL-XL) 24 hr tablet 50 mg  50 mg Oral Daily [name], [name], MD   50 mg at 04/17/17 0859  . nicotine (NICODERM CQ - dosed in mg/24 hours) patch 21 mg  21 mg Transdermal Daily Pucilowska, Jolanta B, MD      . paliperidone (INVEGA) 24 hr tablet 6 mg  6 mg Oral QHS [name],  [name], MD      . [name] ON 04/18/2017] pneumococcal 23 valent vaccine (PNU-IMMUNE) injection 0.5 mL  0.5 mL Intramuscular Tomorrow-1000 Pucilowska, Jolanta B, MD       PTA Medications: Prescriptions Prior to Admission  Medication Sig Dispense Refill Last Dose  . amantadine (SYMMETREL) 100 MG capsule Take 100 mg by mouth 2 (two) times daily.     . benztropine (COGENTIN) 2 MG tablet Take 1 tablet by mouth daily.     . divalproex (DEPAKOTE) 250 MG DR tablet Take 3 tablets (750 mg total) by mouth every 12 (twelve) hours. 180 tablet 0 Taking  . insulin glargine (LANTUS) 100 UNIT/ML injection Inject 0.1 mLs (10 Units total) into the skin daily. 10 mL 0 Taking  . lithium carbonate (ESKALITH) 450 MG CR tablet Take 450 mg by mouth 2 (two) times daily.     [name] lithium carbonate 300 MG capsule Take 300 mg by mouth 3 (three) times daily.     . metFORMIN (GLUCOPHAGE) 1000 MG tablet Take 1 tablet (1,000 mg total) by mouth 2 (two) times daily with a meal. 60 tablet 0 Taking  . metoprolol succinate (TOPROL-XL) 50 MG 24 hr tablet Take 1 tablet (50 mg total) by mouth daily. Take with or immediately following a meal. 30 tablet 0 Taking  . propranolol (INDERAL)  20 MG tablet Take 20 mg by mouth 2 (two) times daily.       Patient Stressors: Financial difficulties Health problems Medication change or noncompliance  Patient Strengths: Ability for insight Capable of independent living Supportive family/friends  Treatment Modalities: Medication Management, Group therapy, Case management,  1 to 1 session with clinician, Psychoeducation, Recreational therapy.   Physician Treatment Plan for Primary Diagnosis: Bipolar affective disorder, currently manic, severe, with psychotic features (HCC) Long Term Goal(s):     Short Term Goals:    Medication Management: Evaluate patient's response, side effects, and tolerance of medication regimen.  Therapeutic Interventions: 1 to 1 sessions, Unit Group sessions and  Medication administration.  Evaluation of Outcomes: Progressing  Physician Treatment Plan for Secondary Diagnosis: Principal Problem:   Bipolar affective disorder, currently manic, severe, with psychotic features (HCC) Active Problems:   Essential hypertension   Obesity   Diabetes (HCC)   Tobacco use disorder   Benign familial tremor  Long Term Goal(s):     Short Term Goals:       Medication Management: Evaluate patient's response, side effects, and tolerance of medication regimen.  Therapeutic Interventions: 1 to 1 sessions, Unit Group sessions and Medication administration.  Evaluation of Outcomes: Progressing   RN Treatment Plan for Primary Diagnosis: Bipolar affective disorder, currently manic, severe, with psychotic features (HCC) Long Term Goal(s): Knowledge of disease and therapeutic regimen to maintain health will improve  Short Term Goals: Ability to verbalize frustration and anger appropriately will improve, Ability to participate in decision making will improve and Ability to identify and develop effective coping behaviors will improve  Medication Management: RN will administer medications as ordered by provider, will assess and evaluate patient's response and provide education to patient for prescribed medication. RN will report any adverse and/or side effects to prescribing provider.  Therapeutic Interventions: 1 on 1 counseling sessions, Psychoeducation, Medication administration, Evaluate responses to treatment, Monitor vital signs and CBGs as ordered, Perform/monitor CIWA, COWS, AIMS and Fall Risk screenings as ordered, Perform wound care treatments as ordered.  Evaluation of Outcomes: Progressing   LCSW Treatment Plan for Primary Diagnosis: Bipolar affective disorder, currently manic, severe, with psychotic features (HCC) Long Term Goal(s): Safe transition to appropriate next level of care at discharge, Engage patient in therapeutic group addressing interpersonal  concerns.  Short Term Goals: Engage patient in aftercare planning with referrals and resources, Increase ability to appropriately verbalize feelings, Increase emotional regulation and Identify triggers associated with mental health/substance abuse issues  Therapeutic Interventions: Assess for all discharge needs, 1 to 1 time with Social worker, Explore available resources and support systems, Assess for adequacy in community support network, Educate family and significant other(s) on suicide prevention, Complete Psychosocial Assessment, Interpersonal group therapy.  Evaluation of Outcomes: Progressing   Progress in Treatment: Attending groups: Yes. Participating in groups: Yes. Taking medication as prescribed: Yes. Toleration medication: Yes. Family/Significant other contact made: No, will contact:    Patient understands diagnosis: Yes. Discussing patient identified problems/goals with staff: Yes. Medical problems stabilized or resolved: Yes. Denies suicidal/homicidal ideation: Yes. Issues/concerns per patient self-inventory: No. Other:    New problem(s) identified: No, Describe:     New Short Term/Long Term Goal(s):  Discharge Plan or Barriers:   Reason for Continuation of Hospitalization: Mania Medication stabilization  Estimated Length of Stay:3-5 days  Attendees: Patient: Greg [name] 04/17/2017 1:00 PM  Physician: Braulio Conte Pucilowska 04/17/2017 1:00 PM  Nursing: Hulan Amato, RN 04/17/2017 1:00 PM  RN Care Manager: 04/17/2017 1:00 PM  Social  Worker: Jake Shark, LCSW 04/17/2017 1:00 PM  Recreational Therapist:  04/17/2017 1:00 PM  Other:  04/17/2017 1:00 PM  Other:  04/17/2017 1:00 PM  Other: 04/17/2017 1:00 PM    Scribe for Treatment Team: Glennon Mac, LCSW 04/17/2017 1:00 PM

## 2017-04-17 NOTE — H&P (Addendum)
Psychiatric Admission Assessment Adult  Patient Identification: Greg [name] MRN:  [id number] Date of Evaluation:  04/17/2017 Chief Complaint:  Bipolar Disorder Principal Diagnosis: Schizoaffective disorder, bipolar type (HCC) Diagnosis:   Patient Active Problem List   Diagnosis Date Noted  . Schizoaffective disorder, bipolar type (HCC) [F25.0] 03/28/2016  . Acute cholecystitis with chronic cholecystitis [K81.2] 03/27/2016  . Anxiety [F41.9]   . 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 Bipolar 2 disorder (HCC) [F31.81]   . Hyponatremia [E87.1] 10/06/2015  . Essential hypertension [I10] 11/28/2014  . Dyslipidemia [E78.5] 11/28/2014  . Obesity [E66.9] 11/28/2014  . Diabetes (HCC) [E11.9] 11/28/2014  . Tobacco use disorder [F17.200] 11/28/2014   History of Present Illness:   Identifying data. [name] is a 51 year old male with a history of schizoaffective disorder.  Chief complaint. "I have been eating a lot of salt."  History of present illness. Information was obtained from the patient, the chart, and his mother on the phone. The patient was completed by his mother for escalating symptoms of mania with psychosis. The patient became threatening to his elderly parents, refused to take some of his medications, has not been able to sleep, and has been increasingly agitated. He also states started bizarre behavior with consuming large volumes of salt. He has also been neglectful his medical problems. He stopped taking his insulin and blood pressure medication believing that God cured him. The mother reports that the patient has been only partially compliant with medications, picking and choosing what to take. On admission his Lithium level was therapeutic while Depakote level was low. The patient has been taking oral Invega but he is very skilful to cheek his medication. He was on [place] in the past with good results. It is unclear why Invega injections were stopped.  According to the mother, all summer long, the patient has been standing in the throat house naked with a necklace made O pieces of concrete, greeting passersby cars, and protesting against abortion. He lately been rather delusional believing that he is God. He grew his hear over the summer but recently buzz cut some of it.   The patient himself admits that he is more manic than usual. He believes that he is taking salt to prevent tremors. He is very disorganized during treatment team meeting and hard to understand. His speech is pressured and loud. He is giddy and intrusive.  Past psychiatric history. There is a long history of bipolar illness or schizoaffective disorder with multiple psychiatric admissions and multiple treatment trials. He did exceedingly well on lithium initially. He did relatively well on [place] injections. He has never been on Clozaril. He mostly presents with mania but the mother remembers one serious depressive episode.  Family psychiatric history. Grandmother with schizophrenia.  Social history. He dropped out of high school but got his GED and went to college. He is divorced. He is disabled from mental illness. He lives with his elderly parents who no longer believe they can provide care.   Total Time spent with patient: 1 hour  Is the patient at risk to self? No.  Has the patient been a risk to self in the past 6 months? No.  Has the patient been a risk to self within the distant past? No.  Is the patient a risk to others? Yes.    Has the patient been a risk to others in the past 6 months? No.  Has the patient been a risk to others within the distant past? No.  Prior Inpatient Therapy:   Prior Outpatient Therapy:    Alcohol Screening: 1. How often do you have a drink containing alcohol?: Never 2. How many drinks containing alcohol do you have on a typical day when you are drinking?: 1 or 2 3. How often do you have six or more drinks on one occasion?:  Never Preliminary Score: 0 4. How often during the last year have you found that you were not able to stop drinking once you had started?: Never 5. How often during the last year have you failed to do what was normally expected from you becasue of drinking?: Never 6. How often during the last year have you needed a first drink in the morning to get yourself going after a heavy drinking session?: Never 7. How often during the last year have you had a feeling of guilt of remorse after drinking?: Never 8. How often during the last year have you been unable to remember what happened the night before because you had been drinking?: Never 9. Have you or someone else been injured as a result of your drinking?: No 10. Has a relative or friend or a doctor or another health worker been concerned about your drinking or suggested you cut down?: No Alcohol Use Disorder Identification Test Final Score (AUDIT): 0 Brief Intervention: AUDIT score less than 7 or less-screening does not suggest unhealthy drinking-brief intervention not indicated Substance Abuse History in the last 12 months:  Yes.   Consequences of Substance Abuse: Negative Previous Psychotropic Medications: Yes  Psychological Evaluations: No  Past Medical History:  Past Medical History:  Diagnosis Date  . Bipolar 1 disorder (HCC)   . Diabetes (HCC)   . Dyslipidemia   . HTN (hypertension)   . Obesity   . Paranoia (HCC)   . Schizo affective schizophrenia [facility])     Past Surgical History:  Procedure Laterality Date  . APPENDECTOMY  51 years old  . CHOLECYSTECTOMY N/A 03/30/2016   Procedure: LAPAROSCOPIC CHOLECYSTECTOMY;  Surgeon: Gladis Riffle, MD;  Location: [facility] ORS;  Service: General;  Laterality: N/A;  . TONSILLECTOMY  51 year old   Family History:  Family History  Problem Relation Age of Onset  . Depression Mother   . Mental illness Mother   . Depression Father   . Mental illness Father   . Depression Sister   . Mental  illness Sister     Tobacco Screening: Have you used any form of tobacco in the last 30 days? (Cigarettes, Smokeless Tobacco, Cigars, and/or Pipes): Yes Tobacco use, Select all that apply: smokeless tobacco use, not daily, 5 or more cigarettes per day Are you interested in Tobacco Cessation Medications?: Yes, will notify MD for an order Counseled patient on smoking cessation including recognizing danger situations, developing coping skills and basic information about quitting provided: Yes (Nicotine Patch) Social History:  History  Alcohol Use No     History  Drug Use No    Additional Social History:                           Allergies:   Allergies  Allergen Reactions  . Haldol [Haloperidol] Anaphylaxis  . Seroquel [Quetiapine Fumarate] Other (See Comments)    Reaction: morbid, depressed   Lab Results:  Results for orders placed or performed during the hospital encounter of 04/16/17 (from the past 48 hour(s))  Glucose, capillary     Status: Abnormal   Collection Time: 04/16/17 11:44 PM  Result Value Ref Range   Glucose-Capillary 147 (H) 65 - 99 mg/dL  Hemoglobin Z6X     Status: Abnormal   Collection Time: 04/17/17  7:09 AM  Result Value Ref Range   Hgb A1c MFr Bld 10.9 (H) 4.8 - 5.6 %    Comment: (NOTE) Pre diabetes:          5.7%-6.4% Diabetes:              >6.4% Glycemic control for   <7.0% adults with diabetes    Mean Plasma Glucose 266.13 mg/dL    Comment: Performed at [facility], [address] [place], [place] [address]  Lipid panel     Status: Abnormal   Collection Time: 04/17/17  7:09 AM  Result Value Ref Range   Cholesterol 103 0 - 200 mg/dL   Triglycerides 540 <981 mg/dL   HDL 34 (L) >19 mg/dL   Total CHOL/HDL Ratio 3.0 RATIO   VLDL 22 0 - 40 mg/dL   LDL Cholesterol 47 0 - 99 mg/dL    Comment:        Total Cholesterol/HDL:CHD Risk Coronary Heart Disease Risk Table                     Men   Women  1/2 Average Risk   3.4   3.3   Average Risk       5.0   4.4  2 X Average Risk   9.6   7.1  3 X Average Risk  23.4   11.0        Use the calculated Patient Ratio above and the CHD Risk Table to determine the patient's CHD Risk.        ATP III CLASSIFICATION (LDL):  <100     mg/dL   Optimal  147-829  mg/dL   Near or Above                    Optimal  130-159  mg/dL   Borderline  562-130  mg/dL   High  >865     mg/dL   Very High   TSH     Status: None   Collection Time: 04/17/17  7:09 AM  Result Value Ref Range   TSH 2.733 0.350 - 4.500 uIU/mL    Comment: Performed by a 3rd Generation assay with a functional sensitivity of <=0.01 uIU/mL.  Glucose, capillary     Status: Abnormal   Collection Time: 04/17/17 11:44 AM  Result Value Ref Range   Glucose-Capillary 223 (H) 65 - 99 mg/dL    Blood Alcohol level:  Lab Results  Component Value Date   ETH <5 04/14/2017   ETH <5 10/06/2015    Metabolic Disorder Labs:  Lab Results  Component Value Date   HGBA1C 10.9 (H) 04/17/2017   MPG 266.13 04/17/2017   MPG 154 02/09/2015   Lab Results  Component Value Date   PROLACTIN 45.2 (H) 10/07/2015   Lab Results  Component Value Date   CHOL 103 04/17/2017   TRIG 110 04/17/2017   HDL 34 (L) 04/17/2017   CHOLHDL 3.0 04/17/2017   VLDL 22 04/17/2017   LDLCALC 47 04/17/2017   LDLCALC 44 10/07/2015    Current Medications: Current Facility-Administered Medications  Medication Dose Route Frequency Provider Last Rate Last Dose  . acetaminophen (TYLENOL) tablet 650 mg  650 mg Oral Q6H PRN [name], [name], MD      . alum & mag hydroxide-simeth (MAALOX/MYLANTA)  200-200-20 MG/5ML suspension 30 mL  30 mL Oral Q4H PRN [name], [name], MD      . divalproex (DEPAKOTE) DR tablet 750 mg  750 mg Oral Q12H [name], [name], MD   750 mg at 04/17/17 0859  . hydrOXYzine (ATARAX/VISTARIL) tablet 50 mg  50 mg Oral TID PRN [name], [name], MD   50 mg at 04/17/17 0039  . [START ON 04/18/2017] Influenza vac split quadrivalent PF (FLUARIX)  injection 0.5 mL  0.5 mL Intramuscular Tomorrow-1000 Keng Jewel B, MD      . insulin glargine (LANTUS) injection 10 Units  10 Units Subcutaneous Daily [name], [name], MD      . lithium carbonate (ESKALITH) CR tablet 450 mg  450 mg Oral Q12H [name], [name], MD   450 mg at 04/17/17 0859  . magnesium hydroxide (MILK OF MAGNESIA) suspension 30 mL  30 mL Oral Daily PRN [name], [name], MD      . metFORMIN (GLUCOPHAGE) tablet 1,000 mg  1,000 mg Oral BID WC [name], [name], MD   1,000 mg at 04/17/17 1623  . metoprolol succinate (TOPROL-XL) 24 hr tablet 50 mg  50 mg Oral Daily [name], [name], MD   50 mg at 04/17/17 0859  . nicotine (NICODERM CQ - dosed in mg/24 hours) patch 21 mg  21 mg Transdermal Daily Thelma Lorenzetti B, MD   21 mg at 04/17/17 1623  . paliperidone (INVEGA SUSTENNA) injection 234 mg  234 mg Intramuscular Q28 days Kerolos Nehme B, MD      . paliperidone (INVEGA) 24 hr tablet 6 mg  6 mg Oral QHS [name], [name], MD      . [name] ON 04/18/2017] pneumococcal 23 valent vaccine (PNU-IMMUNE) injection 0.5 mL  0.5 mL Intramuscular Tomorrow-1000 Leinani Lisbon B, MD      . temazepam (RESTORIL) capsule 30 mg  30 mg Oral QHS Quint Chestnut B, MD       PTA Medications: Prescriptions Prior to Admission  Medication Sig Dispense Refill Last Dose  . amantadine (SYMMETREL) 100 MG capsule Take 100 mg by mouth 2 (two) times daily.     . benztropine (COGENTIN) 2 MG tablet Take 1 tablet by mouth daily.     . divalproex (DEPAKOTE) 250 MG DR tablet Take 3 tablets (750 mg total) by mouth every 12 (twelve) hours. 180 tablet 0 Taking  . insulin glargine (LANTUS) 100 UNIT/ML injection Inject 0.1 mLs (10 Units total) into the skin daily. 10 mL 0 Taking  . lithium carbonate (ESKALITH) 450 MG CR tablet Take 450 mg by mouth 2 (two) times daily.     [name] lithium carbonate 300 MG capsule Take 300 mg by mouth 3 (three) times daily.     . metFORMIN (GLUCOPHAGE) 1000 MG tablet Take 1 tablet  (1,000 mg total) by mouth 2 (two) times daily with a meal. 60 tablet 0 Taking  . metoprolol succinate (TOPROL-XL) 50 MG 24 hr tablet Take 1 tablet (50 mg total) by mouth daily. Take with or immediately following a meal. 30 tablet 0 Taking  . propranolol (INDERAL) 20 MG tablet Take 20 mg by mouth 2 (two) times daily.       Musculoskeletal: Strength & Muscle Tone: within normal limits Gait & Station: normal Patient leans: N/A  Psychiatric Specialty Exam: Physical Exam  Nursing note and vitals reviewed. Constitutional: He is oriented to person, place, and time. He appears well-developed and well-nourished.  HENT:  Head: Normocephalic and atraumatic.  Eyes: Pupils  are equal, round, and reactive to light. Conjunctivae and EOM are normal.  Neck: Normal range of motion. Neck supple.  Cardiovascular: Normal rate, regular rhythm and normal heart sounds.   Respiratory: Effort normal and breath sounds normal.  GI: Soft. Bowel sounds are normal.  Musculoskeletal: He exhibits edema.  Neurological: He is alert and oriented to person, place, and time.  Skin: Skin is warm and dry.  Psychiatric: His affect is inappropriate. His speech is rapid and/or pressured. He is hyperactive and actively hallucinating. Thought content is paranoid and delusional. Cognition and memory are normal. He expresses impulsivity.    Review of Systems  Constitutional: Negative.   HENT: Negative.   Eyes: Negative.   Respiratory: Negative.   Cardiovascular: Positive for leg swelling.  Gastrointestinal: Negative.   Genitourinary: Negative.   Musculoskeletal: Negative.   Skin: Negative.   Neurological: Negative.   Endo/Heme/Allergies: Negative.   Psychiatric/Behavioral: Positive for hallucinations. The patient has insomnia.     Blood pressure (!) 147/72, pulse 84, temperature 98.6 F (37 C), temperature source Oral, resp. rate 20, height  (1.753 m), weight 111.6 kg (246 lb), SpO2 100 %.Body mass index is 36.33  kg/m.  See SRA.                                                  Sleep:  Number of Hours: 7.45    Treatment Plan Summary: Daily contact with patient to assess and evaluate symptoms and progress in treatment and Medication management   Mr. Caldera is a 51 year old male with history of schizoaffective disorder admitted floridly manic in spite of reasonable medication compliance.  1. Mood and psychosis. He has been maintained on a combination of oral Invega, lithium, and Depakote. Will continue all his medications. The patient agreed to [place] injection. We will consider clozapine.  2. Insomnia. We will start Restoril.  3. Hypertension. He is on metoprolol.   4. Diabetes. He is on ADA diet, Lantus and metformin with blood glucose monitoring.   5. Smoking. Nicotine patch is available.  6. Sleep apnea. He is on CPAP.  7. Metabolic syndrome monitoring. Lipid panel, TSH, hemoglobin A1c are pending.  8. EKG. Pending.  9. Social. He lives with her elderly parents who no longer feel they can provide care. They will need advice on guardianship process and placement.  10. Disposition. To be established. He will unlikely be discharged back to his parents. We recommend ACT team services following discharge.  Observation Level/Precautions:  15 minute checks  Laboratory:  CBC Chemistry Profile UDS UA  Psychotherapy:    Medications:    Consultations:    Discharge Concerns:    Estimated LOS:  Other:     Physician Treatment Plan for Primary Diagnosis: Schizoaffective disorder, bipolar type (HCC) Long Term Goal(s): Improvement in symptoms so as ready for discharge  Short Term Goals: Ability to identify changes in lifestyle to reduce recurrence of condition will improve, Ability to verbalize feelings will improve, Ability to disclose and discuss suicidal ideas, Ability to demonstrate self-control will improve, Ability to identify and develop effective coping  behaviors will improve, Compliance with prescribed medications will improve and Ability to identify triggers associated with substance abuse/mental health issues will improve  Physician Treatment Plan for Secondary Diagnosis: Principal Problem:   Schizoaffective disorder, bipolar type (HCC) Active Problems:   Essential hypertension  Obesity   Diabetes (HCC)   Tobacco use disorder  Long Term Goal(s): Improvement in symptoms so as ready for discharge  Short Term Goals: Compliance with prescribed medications will improve  I certify that inpatient services furnished can reasonably be expected to improve the patient's condition.    Kristine Linea, MD 9/19/20184:54 PM

## 2017-04-17 NOTE — Plan of Care (Signed)
Problem: Safety: Goal: Ability to redirect hostility and anger into socially appropriate behaviors will improve Outcome: Progressing Pleasant and cooperative.  No aggression noted.

## 2017-04-17 NOTE — Plan of Care (Signed)
Problem: Activity: Goal: Sleeping patterns will improve Outcome: Progressing Patient slept for Estimated Hours of 7.45; Precautionary checks every 15 minutes for safety maintained, room free of safety hazards, patient sustains no injury or falls during this shift.     

## 2017-04-18 LAB — GLUCOSE, CAPILLARY
Glucose-Capillary: 142 mg/dL — ABNORMAL HIGH (ref 65–99)
Glucose-Capillary: 213 mg/dL — ABNORMAL HIGH (ref 65–99)
Glucose-Capillary: 136 mg/dL — ABNORMAL HIGH (ref 65–99)
Glucose-Capillary: 137 mg/dL — ABNORMAL HIGH (ref 65–99)

## 2017-04-18 LAB — PROLACTIN: Prolactin: 43.3 ng/mL — ABNORMAL HIGH (ref 4.0–15.2)

## 2017-04-18 MED ORDER — INSULIN ASPART 100 UNIT/ML ~~LOC~~ SOLN
0.0000 [IU] | Freq: Three times a day (TID) | SUBCUTANEOUS | Status: DC
  Administered 2017-04-18 – 2017-04-19 (×2): 3 [IU] via SUBCUTANEOUS
  Administered 2017-04-19: 2 [IU] via SUBCUTANEOUS
  Administered 2017-04-20 (×2): 3 [IU] via SUBCUTANEOUS
  Administered 2017-04-21 (×3): 2 [IU] via SUBCUTANEOUS
  Administered 2017-04-22: 3 [IU] via SUBCUTANEOUS
  Administered 2017-04-23: 2 [IU] via SUBCUTANEOUS
  Administered 2017-04-24: 3 [IU] via SUBCUTANEOUS
  Filled 2017-04-18 (×5): qty 1

## 2017-04-18 MED ORDER — INSULIN ASPART 100 UNIT/ML ~~LOC~~ SOLN: 0.0000 [IU] | Freq: Every day | SUBCUTANEOUS | Status: DC

## 2017-04-18 NOTE — Plan of Care (Signed)
Problem: Self-Care: Goal: Ability to participate in self-care as condition permits will improve Outcome: Progressing Maintains personal care chores appropriatetly.

## 2017-04-18 NOTE — [facility] Group Notes (Signed)
LCSW Group Therapy Note  04/18/2017 1:15pm  Type of Therapy/Topic:  Group Therapy:  Balance in Life  Participation Level:  Did Not Attend  Description of Group:    This group will address the concept of balance and how it feels and looks when one is unbalanced. Patients will be encouraged to process areas in their lives that are out of balance and identify reasons for remaining unbalanced. Facilitators will guide patients in utilizing problem-solving interventions to address and correct the stressor making their life unbalanced. Understanding and applying boundaries will be explored and addressed for obtaining and maintaining a balanced life. Patients will be encouraged to explore ways to assertively make their unbalanced needs known to significant others in their lives, using other group members and facilitator for support and feedback.  Therapeutic Goals: 1. Patient will identify two or more emotions or situations they have that consume much of in their lives. 2. Patient will identify signs/triggers that life has become out of balance:  3. Patient will identify two ways to set boundaries in order to achieve balance in their lives:  4. Patient will demonstrate ability to communicate their needs through discussion and/or role plays  Summary of Patient Progress:      Therapeutic Modalities:   Cognitive Behavioral Therapy Solution-Focused Therapy Assertiveness Training  Sallee Lange, LCSW 04/18/2017 2:18 PM

## 2017-04-18 NOTE — Progress Notes (Signed)
[facility] MD Progress Note  04/18/2017 4:38 AM Greg [name]  MRN:  [id number]  Subjective:    History of present illness. Information was obtained from the patient, the chart, and his mother on the phone. The patient was completed by his mother for escalating symptoms of mania with psychosis. The patient became threatening to his elderly parents, refused to take some of his medications, has not been able to sleep, and has been increasingly agitated. He also states started bizarre behavior with consuming large volumes of salt. He has also been neglectful his medical problems. He stopped taking his insulin and blood pressure medication believing that God cured him. The mother reports that the patient has been only partially compliant with medications, picking and choosing what to take. On admission his Lithium level was therapeutic while Depakote level was low. The patient has been taking oral Invega but he is very skilful to cheek his medication. He was on [place] in the past with good results. It is unclear why Invega injections were stopped. According to the mother, all summer long, the patient has been standing in the throat house naked with a necklace made O pieces of concrete, greeting passersby cars, and protesting against abortion. He lately been rather delusional believing that he is God. He grew his hear over the summer but recently buzz cut some of it.   04/18/2017. [name] continues to be intrusive, irritable, loud and easily agitated. He took his medications. Hinda Glatter sustenna was not given as it has not arrived from the pharmacy. We had a discussion about starting Clozapine. The patient has never taken it but "heard about it". I had a long conversation with his mother who is in support. The patient has not been well all summer long  But only recently became threatening to his elderly mother. He has no somatic compalints. Slept better, appetite is good. He isolates in his room but is  approachable and even funny but goofy.  Per nursing: D: Pt denies SI/HI/AVH, Pt is pleasant and cooperative affect is flat but brightens upon approach. Patient's thoughts are disorganized, speech is tangential, often times he rambles when talking to staff. Patient was noted acting bizarre, staring into the nurses station  And when asked if he needed anything he responded no. Patient was also seen exchanging clothes with other peers and he was reminded that that was against the unit policy.  Pt  appears less anxious and he is interacting with peers and staff appropriately.  A: Pt was offered support and encouragement. Pt was given scheduled medications. Pt was encouraged to attend groups. Q 15 minute checks were done for safety.  R:Pt attends groups and interacts well with peers and staff. Pt is compliant with  medication. Pt receptive to treatment and safety maintained on unit.  Principal Problem: Schizoaffective disorder, bipolar type (HCC) Diagnosis:   Patient Active Problem List   Diagnosis Date Noted  . Schizoaffective disorder, bipolar type (HCC) [F25.0] 03/28/2016  . Acute cholecystitis with chronic cholecystitis [K81.2] 03/27/2016  . Anxiety [F41.9]   . 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 Bipolar 2 disorder (HCC) [F31.81]   . Hyponatremia [E87.1] 10/06/2015  . Essential hypertension [I10] 11/28/2014  . Dyslipidemia [E78.5] 11/28/2014  . Obesity [E66.9] 11/28/2014  . Diabetes (HCC) [E11.9] 11/28/2014  . Tobacco use disorder [F17.200] 11/28/2014   Total Time spent with patient: 30 minutes  Past Psychiatric History: long history of bipolar manic episodes, multiple hospitalizations and medication trials, poor treatment compliance.  Past  Medical History:  Past Medical History:  Diagnosis Date  . Bipolar 1 disorder (HCC)   . Diabetes (HCC)   . Dyslipidemia   . HTN (hypertension)   . Obesity   . Paranoia (HCC)   . Schizo affective schizophrenia [facility])     Past Surgical  History:  Procedure Laterality Date  . APPENDECTOMY  51 years old  . CHOLECYSTECTOMY N/A 03/30/2016   Procedure: LAPAROSCOPIC CHOLECYSTECTOMY;  Surgeon: Gladis Riffle, MD;  Location: [facility] ORS;  Service: General;  Laterality: N/A;  . TONSILLECTOMY  51 year old   Family History:  Family History  Problem Relation Age of Onset  . Depression Mother   . Mental illness Mother   . Depression Father   . Mental illness Father   . Depression Sister   . Mental illness Sister    Family Psychiatric  History: grandmother with schizophrenia. Social History:  History  Alcohol Use No     History  Drug Use No    Social History   Social History  . Marital status: Single    Spouse name: N/A  . Number of children: N/A  . Years of education: N/A   Social History Main Topics  . Smoking status: Current Every Day Smoker    Packs/day: 0.50    Years: 8.00    Types: Cigarettes  . Smokeless tobacco: Current User    Types: Snuff  . Alcohol use No  . Drug use: No  . Sexual activity: No   Other Topics Concern  . None   Social History Narrative  . None   Additional Social History: the patient is disabled from mental illness. He lives with his elderly parents who no longer are able to care for him.                        Sleep: Fair  Appetite:  Fair  Current Medications: Current Facility-Administered Medications  Medication Dose Route Frequency Provider Last Rate Last Dose  . acetaminophen (TYLENOL) tablet 650 mg  650 mg Oral Q6H PRN [name], [name], MD      . alum & mag hydroxide-simeth (MAALOX/MYLANTA) 200-200-20 MG/5ML suspension 30 mL  30 mL Oral Q4H PRN [name], [name], MD      . divalproex (DEPAKOTE) DR tablet 750 mg  750 mg Oral Q12H [name], [name], MD   750 mg at 04/17/17 2100  . hydrOXYzine (ATARAX/VISTARIL) tablet 50 mg  50 mg Oral TID PRN [name], [name], MD   50 mg at 04/17/17 0039  . Influenza vac split quadrivalent PF (FLUARIX) injection 0.5 mL  0.5 mL  Intramuscular Tomorrow-1000 Naziya Hegwood B, MD      . insulin glargine (LANTUS) injection 10 Units  10 Units Subcutaneous Daily [name], [name], MD   10 Units at 04/17/17 2207  . lithium carbonate (ESKALITH) CR tablet 450 mg  450 mg Oral Q12H [name], [name], MD   450 mg at 04/17/17 2100  . magnesium hydroxide (MILK OF MAGNESIA) suspension 30 mL  30 mL Oral Daily PRN [name], [name], MD      . metFORMIN (GLUCOPHAGE) tablet 1,000 mg  1,000 mg Oral BID WC [name], [name], MD   1,000 mg at 04/17/17 1623  . metoprolol succinate (TOPROL-XL) 24 hr tablet 50 mg  50 mg Oral Daily [name], [name], MD   50 mg at 04/17/17 0859  . nicotine (NICODERM CQ - dosed in mg/24 hours) patch 21 mg  21 mg Transdermal Daily Idalie Canto B, MD   21 mg at 04/17/17 1623  . paliperidone (INVEGA SUSTENNA) injection 234 mg  234 mg Intramuscular Q28 days Mykia Holton B, MD      . paliperidone (INVEGA) 24 hr tablet 6 mg  6 mg Oral QHS [name], [name], MD   6 mg at 04/17/17 2208  . pneumococcal 23 valent vaccine (PNU-IMMUNE) injection 0.5 mL  0.5 mL Intramuscular Tomorrow-1000 Artelia Game B, MD      . temazepam (RESTORIL) capsule 30 mg  30 mg Oral QHS Omair Dettmer B, MD   30 mg at 04/17/17 2208    Lab Results:  Results for orders placed or performed during the hospital encounter of 04/16/17 (from the past 48 hour(s))  Glucose, capillary     Status: Abnormal   Collection Time: 04/16/17 11:44 PM  Result Value Ref Range   Glucose-Capillary 147 (H) 65 - 99 mg/dL  Hemoglobin Z6X     Status: Abnormal   Collection Time: 04/17/17  7:09 AM  Result Value Ref Range   Hgb A1c MFr Bld 10.9 (H) 4.8 - 5.6 %    Comment: (NOTE) Pre diabetes:          5.7%-6.4% Diabetes:              >6.4% Glycemic control for   <7.0% adults with diabetes    Mean Plasma Glucose 266.13 mg/dL    Comment: Performed at [facility], [address] [place], [place] [address]  Lipid panel     Status: Abnormal    Collection Time: 04/17/17  7:09 AM  Result Value Ref Range   Cholesterol 103 0 - 200 mg/dL   Triglycerides 540 <981 mg/dL   HDL 34 (L) >19 mg/dL   Total CHOL/HDL Ratio 3.0 RATIO   VLDL 22 0 - 40 mg/dL   LDL Cholesterol 47 0 - 99 mg/dL    Comment:        Total Cholesterol/HDL:CHD Risk Coronary Heart Disease Risk Table                     Men   Women  1/2 Average Risk   3.4   3.3  Average Risk       5.0   4.4  2 X Average Risk   9.6   7.1  3 X Average Risk  23.4   11.0        Use the calculated Patient Ratio above and the CHD Risk Table to determine the patient's CHD Risk.        ATP III CLASSIFICATION (LDL):  <100     mg/dL   Optimal  147-829  mg/dL   Near or Above                    Optimal  130-159  mg/dL   Borderline  562-130  mg/dL   High  >865     mg/dL   Very High   TSH     Status: None   Collection Time: 04/17/17  7:09 AM  Result Value Ref Range   TSH 2.733 0.350 - 4.500 uIU/mL    Comment: Performed by a 3rd Generation assay with a functional sensitivity of <=0.01 uIU/mL.  Glucose, capillary     Status: Abnormal   Collection Time: 04/17/17 11:44 AM  Result Value Ref Range   Glucose-Capillary 223 (H) 65 - 99 mg/dL    Blood Alcohol level:  Lab  Results  Component Value Date   ETH <5 04/14/2017   ETH <5 10/06/2015    Metabolic Disorder Labs: Lab Results  Component Value Date   HGBA1C 10.9 (H) 04/17/2017   MPG 266.13 04/17/2017   MPG 154 02/09/2015   Lab Results  Component Value Date   PROLACTIN 45.2 (H) 10/07/2015   Lab Results  Component Value Date   CHOL 103 04/17/2017   TRIG 110 04/17/2017   HDL 34 (L) 04/17/2017   CHOLHDL 3.0 04/17/2017   VLDL 22 04/17/2017   LDLCALC 47 04/17/2017   LDLCALC 44 10/07/2015    Physical Findings: AIMS:  , ,  ,  ,    CIWA:    COWS:     Musculoskeletal: Strength & Muscle Tone: within normal limits Gait & Station: normal Patient leans: N/A  Psychiatric Specialty Exam: Physical Exam  Nursing note and  vitals reviewed. Psychiatric: His affect is labile and inappropriate. His speech is rapid and/or pressured and tangential. He is hyperactive. Thought content is paranoid and delusional. Cognition and memory are normal. He expresses impulsivity.    Review of Systems  Constitutional: Negative.   HENT: Negative.   Eyes: Negative.   Respiratory: Negative.   Cardiovascular: Negative.   Gastrointestinal: Negative.   Genitourinary: Negative.   Musculoskeletal: Negative.   Skin: Negative.   Neurological: Negative.   Endo/Heme/Allergies: Negative.   Psychiatric/Behavioral: The patient has insomnia.     Blood pressure (!) 147/72, pulse 84, temperature 98.6 F (37 C), temperature source Oral, resp. rate 20, height  (1.753 m), weight 111.6 kg (246 lb), SpO2 100 %.Body mass index is 36.33 kg/m.  General Appearance: Disheveled  Eye Contact:  Good  Speech:  Pressured  Volume:  Increased  Mood:  Euphoric  Affect:  Inappropriate  Thought Process:  Disorganized and Descriptions of Associations: Loose  Orientation:  Full (Time, Place, and Person)  Thought Content:  Delusions and Paranoid Ideation  Suicidal Thoughts:  No  Homicidal Thoughts:  No  Memory:  Immediate;   Fair Recent;   Fair Remote;   Fair  Judgement:  Poor  Insight:  Lacking  Psychomotor Activity:  Increased  Concentration:  Concentration: Poor and Attention Span: Poor  Recall:  Poor  Fund of Knowledge:  Fair  Language:  Fair  Akathisia:  No  Handed:  Right  AIMS (if indicated):     Assets:  Communication Skills Desire for Improvement Financial Resources/Insurance Housing Leisure Time Resilience Social Support  ADL's:  Intact  Cognition:  WNL  Sleep:  Number of Hours: 7.45     Treatment Plan Summary: Daily contact with patient to assess and evaluate symptoms and progress in treatment and Medication management   Mr. Peppard is a 51 year old male with history of schizoaffective disorder admitted floridly  manic in spite of reasonable medication compliance.  1. Mood and psychosis. He has been maintained on a combination of oral Invega, lithium, and Depakote. We continue all his medications. The patient agreed to [place] injection. We will consider clozapine.  2. Insomnia. We will start Restoril.  3. Hypertension. He is on metoprolol.   4. Diabetes. He is on ADA diet, Lantus and metformin with blood glucose monitoring. We will add SSI as his HgbA1C and sugars are elevated.  5. Smoking. Nicotine patch is available.  6. Sleep apnea. He is on CPAP.  7. Metabolic syndrome monitoring. Lipid panel and TSH are normal, hemoglobin A1c is 10.9.   8. EKG. Normal sinus rhythm, QTc 437.  9. Social. He lives with her elderly parents who no longer feel they can provide care. They will need advice on guardianship process and placement.  10. Disposition. To be established. He will likely be discharged back to his parents. He will follow up with [facility].   Kristine Linea, MD 04/18/2017, 4:38 AM

## 2017-04-18 NOTE — Progress Notes (Signed)
Inpatient Diabetes Program Recommendations  AACE/ADA: New Consensus Statement on Inpatient Glycemic Control (2015)  Target Ranges:  Prepandial:   less than 140 mg/dL      Peak postprandial:   less than 180 mg/dL (1-2 hours)      Critically ill patients:  140 - 180 mg/dL   Lab Results  Component Value Date   GLUCAP 137 (H) 04/18/2017   HGBA1C 10.9 (H) 04/17/2017    Review of Glycemic Control  Results for Greg [name], Greg [name] (MRN [id number]) as of 04/18/2017 13:26  Ref. Range 04/17/2017 11:44 04/17/2017 16:26 04/17/2017 22:06 04/18/2017 07:02 04/18/2017 11:56  Glucose-Capillary Latest Ref Range: 65 - 99 mg/dL 409 (H) 811 (H) 914 (H) 136 (H) 137 (H)    Diabetes history: Type 2 Outpatient Diabetes medications: Lantus 10 units qhs, Metformin  bid Current orders for Inpatient glycemic control: Lantus 10 units qhs, Metformin  bid  Inpatient Diabetes Program Recommendations:   Agree with current medications for blood sugar management.    Susette Racer, RN, BA, MHA, CDE Diabetes Coordinator Inpatient Diabetes Program  [phone] (Team Pager) [phone] [facility] Office) 04/18/2017 1:27 PM

## 2017-04-18 NOTE — Progress Notes (Signed)
D: Pt denies SI/HI/AVH, Pt is pleasant and cooperative affect is flat but brightens upon approach. Patient's thoughts are disorganized, speech is tangential, often times he rambles when talking to staff. Patient was noted acting bizarre, staring into the nurses station  And when asked if he needed anything he responded no. Patient was also seen exchanging clothes with other peers and he was reminded that that was against the unit policy.  Pt  appears less anxious and he is interacting with peers and staff appropriately.  A: Pt was offered support and encouragement. Pt was given scheduled medications. Pt was encouraged to attend groups. Q 15 minute checks were done for safety.  R:Pt attends groups and interacts well with peers and staff. Pt is compliant with  medication. Pt receptive to treatment and safety maintained on unit.  0

## 2017-04-18 NOTE — Progress Notes (Signed)
Patient isolative to room this shift except for meals and medication pass.  Denies SI/HI/AVH.  Support and encouragement offered.  Safety rounds maintained.

## 2017-04-18 NOTE — B[facility] Counselor (Signed)
PSA attempt w patient, asleep and would not awaken.  Will attempt later.  Santa Genera, LCSW Lead Clinical Social Worker Phone:  [phone]

## 2017-04-19 LAB — CBC WITH DIFFERENTIAL/PLATELET
Basophils Absolute: 0 10*3/uL (ref 0–0.1)
Basophils Relative: 1 %
Eosinophils Absolute: 0.2 10*3/uL (ref 0–0.7)
Eosinophils Relative: 3 %
HCT: 40.9 % (ref 40.0–52.0)
Hemoglobin: 13.7 g/dL (ref 13.0–18.0)
Lymphocytes Relative: 27 %
Lymphs Abs: 2.1 10*3/uL (ref 1.0–3.6)
MCH: 28.4 pg (ref 26.0–34.0)
MCHC: 33.4 g/dL (ref 32.0–36.0)
MCV: 84.9 fL (ref 80.0–100.0)
Monocytes Absolute: 0.7 10*3/uL (ref 0.2–1.0)
Monocytes Relative: 9 %
Neutro Abs: 4.8 10*3/uL (ref 1.4–6.5)
Neutrophils Relative %: 60 %
Platelets: 269 10*3/uL (ref 150–440)
RBC: 4.82 MIL/uL (ref 4.40–5.90)
RDW: 13.6 % (ref 11.5–14.5)
WBC: 7.8 10*3/uL (ref 3.8–10.6)

## 2017-04-19 LAB — GLUCOSE, CAPILLARY
Glucose-Capillary: 186 mg/dL — ABNORMAL HIGH (ref 65–99)
Glucose-Capillary: 184 mg/dL — ABNORMAL HIGH (ref 65–99)
Glucose-Capillary: 138 mg/dL — ABNORMAL HIGH (ref 65–99)
Glucose-Capillary: 158 mg/dL — ABNORMAL HIGH (ref 65–99)
Glucose-Capillary: 93 mg/dL (ref 65–99)
Glucose-Capillary: 142 mg/dL — ABNORMAL HIGH (ref 65–99)

## 2017-04-19 MED ORDER — PALIPERIDONE PALMITATE 234 MG/1.5ML IM SUSP
234.0000 mg | INTRAMUSCULAR | Status: DC
  Filled 2017-04-19: qty 1.5

## 2017-04-19 MED ORDER — LIVING WELL WITH DIABETES BOOK
Freq: Once | Status: AC
  Administered 2017-04-19: 21:00:00
  Filled 2017-04-19: qty 1

## 2017-04-19 MED ORDER — CLOZAPINE 25 MG PO TABS
50.0000 mg | ORAL_TABLET | Freq: Every day | ORAL | Status: DC
  Administered 2017-04-19: 50 mg via ORAL
  Filled 2017-04-19: qty 2

## 2017-04-19 NOTE — Progress Notes (Addendum)
[facility] MD Progress Note  04/19/2017 6:44 PM Greg [name]  MRN:  [id number]  Subjective:    History of present illness. Information was obtained from the patient, the chart, and his mother on the phone. The patient was completed by his mother for escalating symptoms of mania with psychosis. The patient became threatening to his elderly parents, refused to take some of his medications, has not been able to sleep, and has been increasingly agitated. He also states started bizarre behavior with consuming large volumes of salt. He has also been neglectful his medical problems. He stopped taking his insulin and blood pressure medication believing that God cured him. The mother reports that the patient has been only partially compliant with medications, picking and choosing what to take. On admission his Lithium level was therapeutic while Depakote level was low. The patient has been taking oral Invega but he is very skilful to cheek his medication. He was on [place] in the past with good results. It is unclear why Invega injections were stopped. According to the mother, all summer long, the patient has been standing in the throat house naked with a necklace made O pieces of concrete, greeting passersby cars, and protesting against abortion. He lately been rather delusional believing that he is God. He grew his hear over the summer but recently buzz cut some of it.   04/18/2017. Greg [name] continues to be intrusive, irritable, loud and easily agitated. He took his medications. Hinda Glatter sustenna was not given as it has not arrived from the pharmacy. We had a discussion about starting Clozapine. The patient has never taken it but "heard about it". I had a long conversation with his mother who is in support. The patient has not been well all summer long  But only recently became threatening to his elderly mother. He has no somatic compalints. Slept better, appetite is good. He isolates in his room but is  approachable and even funny but goofy.  04/19/2017. Greg [name] seem slightly better. He is still restless and irritable but able to engage in conversation with me and with one of his peers. Somehow, Invega sustenna injection ordered on 9/19 has not been given. I renewed the order. The patient is compliant with medications and tolerates them well. There are no somatic complaints. He slept 6 hours with Restoril. We again discussed Clozapine addition. Everybody seems to like the idea but his poor compliance might be a problem. I will order CBC and register the patient.  Per nursing: D: Pt denies SI/HI/AVH, affect is flat but brightens upon approach. Pt is pleasant and cooperative, his thoughts are disorganized, speech is soft but tangential. Pt appears less anxious and he is interacting with peers and staff appropriately.  A: Pt was offered support and encouragement. Pt was given scheduled medications. Pt was encouraged to attend groups. Q 15 minute checks were done for safety.  R:Pt attends groups and interacts well with peers and staff. Pt is taking medication. Pt has no complaints.Pt receptive to treatment and safety maintained on unit.  Principal Problem: Schizoaffective disorder, bipolar type (HCC) Diagnosis:   Patient Active Problem List   Diagnosis Date Noted  . Schizoaffective disorder, bipolar type (HCC) [F25.0] 03/28/2016  . Acute cholecystitis with chronic cholecystitis [K81.2] 03/27/2016  . Anxiety [F41.9]   . 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 Bipolar 2 disorder (HCC) [F31.81]   . Hyponatremia [E87.1] 10/06/2015  . Essential hypertension [I10] 11/28/2014  . Dyslipidemia [E78.5] 11/28/2014  . Obesity [E66.9] 11/28/2014  .  Diabetes (HCC) [E11.9] 11/28/2014  . Tobacco use disorder [F17.200] 11/28/2014   Total Time spent with patient: 30 minutes  Past Psychiatric History: long history of bipolar manic episodes, multiple hospitalizations and medication trials, poor  treatment compliance.  Past Medical History:  Past Medical History:  Diagnosis Date  . Bipolar 1 disorder (HCC)   . Diabetes (HCC)   . Dyslipidemia   . HTN (hypertension)   . Obesity   . Paranoia (HCC)   . Schizo affective schizophrenia [facility])     Past Surgical History:  Procedure Laterality Date  . APPENDECTOMY  51 years old  . CHOLECYSTECTOMY N/A 03/30/2016   Procedure: LAPAROSCOPIC CHOLECYSTECTOMY;  Surgeon: Gladis Riffle, MD;  Location: [facility] ORS;  Service: General;  Laterality: N/A;  . TONSILLECTOMY  51 year old   Family History:  Family History  Problem Relation Age of Onset  . Depression Mother   . Mental illness Mother   . Depression Father   . Mental illness Father   . Depression Sister   . Mental illness Sister    Family Psychiatric  History: grandmother with schizophrenia. Social History:  History  Alcohol Use No     History  Drug Use No    Social History   Social History  . Marital status: Single    Spouse name: N/A  . Number of children: N/A  . Years of education: N/A   Social History Main Topics  . Smoking status: Current Every Day Smoker    Packs/day: 0.50    Years: 8.00    Types: Cigarettes  . Smokeless tobacco: Current User    Types: Snuff  . Alcohol use No  . Drug use: No  . Sexual activity: No   Other Topics Concern  . None   Social History Narrative  . None   Additional Social History: the patient is disabled from mental illness. He lives with his elderly parents who no longer are able to care for him.                        Sleep: Fair  Appetite:  Fair  Current Medications: Current Facility-Administered Medications  Medication Dose Route Frequency Provider Last Rate Last Dose  . acetaminophen (TYLENOL) tablet 650 mg  650 mg Oral Q6H PRN [name], [name], MD      . alum & mag hydroxide-simeth (MAALOX/MYLANTA) 200-200-20 MG/5ML suspension 30 mL  30 mL Oral Q4H PRN [name], [name], MD      . divalproex (DEPAKOTE)  DR tablet 750 mg  750 mg Oral Q12H [name], [name], MD   750 mg at 04/19/17 0820  . hydrOXYzine (ATARAX/VISTARIL) tablet 50 mg  50 mg Oral TID PRN [name], [name], MD   50 mg at 04/17/17 0039  . insulin aspart (novoLOG) injection 0-15 Units  0-15 Units Subcutaneous TID WC Lundon Verdejo B, MD   3 Units at 04/19/17 1218  . insulin aspart (novoLOG) injection 0-5 Units  0-5 Units Subcutaneous QHS Keona Bilyeu B, MD      . insulin glargine (LANTUS) injection 10 Units  10 Units Subcutaneous Daily [name], [name], MD   10 Units at 04/18/17 2145  . lithium carbonate (ESKALITH) CR tablet 450 mg  450 mg Oral Q12H [name], [name], MD   450 mg at 04/19/17 1610  . magnesium hydroxide (MILK OF MAGNESIA) suspension 30 mL  30 mL Oral Daily PRN [name], [name], MD      .  metFORMIN (GLUCOPHAGE) tablet 1,000 mg  1,000 mg Oral BID WC [name], [name], MD   1,000 mg at 04/19/17 1720  . metoprolol succinate (TOPROL-XL) 24 hr tablet 50 mg  50 mg Oral Daily [name], [name], MD   50 mg at 04/19/17 0820  . nicotine (NICODERM CQ - dosed in mg/24 hours) patch 21 mg  21 mg Transdermal Daily Saphyra Hutt B, MD   21 mg at 04/19/17 0820  . paliperidone (INVEGA SUSTENNA) injection 234 mg  234 mg Intramuscular Q28 days Thai Burgueno B, MD      . paliperidone (INVEGA) 24 hr tablet 6 mg  6 mg Oral QHS [name], [name], MD   6 mg at 04/18/17 2144  . pneumococcal 23 valent vaccine (PNU-IMMUNE) injection 0.5 mL  0.5 mL Intramuscular Tomorrow-1000 Kyeshia Zinn B, MD      . temazepam (RESTORIL) capsule 30 mg  30 mg Oral QHS Humbert Morozov B, MD   30 mg at 04/18/17 2144    Lab Results:  Results for orders placed or performed during the hospital encounter of 04/16/17 (from the past 48 hour(s))  Glucose, capillary     Status: Abnormal   Collection Time: 04/17/17 10:06 PM  Result Value Ref Range   Glucose-Capillary 213 (H) 65 - 99 mg/dL  Glucose, capillary     Status: Abnormal   Collection Time:  04/18/17  7:02 AM  Result Value Ref Range   Glucose-Capillary 136 (H) 65 - 99 mg/dL  Glucose, capillary     Status: Abnormal   Collection Time: 04/18/17 11:56 AM  Result Value Ref Range   Glucose-Capillary 137 (H) 65 - 99 mg/dL  Glucose, capillary     Status: Abnormal   Collection Time: 04/18/17  5:06 PM  Result Value Ref Range   Glucose-Capillary 186 (H) 65 - 99 mg/dL  Glucose, capillary     Status: Abnormal   Collection Time: 04/18/17  9:43 PM  Result Value Ref Range   Glucose-Capillary 184 (H) 65 - 99 mg/dL  Glucose, capillary     Status: Abnormal   Collection Time: 04/19/17  7:01 AM  Result Value Ref Range   Glucose-Capillary 138 (H) 65 - 99 mg/dL   Comment 1 Notify RN    Comment 2 Document in Chart   Glucose, capillary     Status: Abnormal   Collection Time: 04/19/17 11:43 AM  Result Value Ref Range   Glucose-Capillary 158 (H) 65 - 99 mg/dL   Comment 1 Notify RN   Glucose, capillary     Status: None   Collection Time: 04/19/17  4:31 PM  Result Value Ref Range   Glucose-Capillary 93 65 - 99 mg/dL   Comment 1 Notify RN     Blood Alcohol level:  Lab Results  Component Value Date   ETH <5 04/14/2017   ETH <5 10/06/2015    Metabolic Disorder Labs: Lab Results  Component Value Date   HGBA1C 10.9 (H) 04/17/2017   MPG 266.13 04/17/2017   MPG 154 02/09/2015   Lab Results  Component Value Date   PROLACTIN 43.3 (H) 04/17/2017   PROLACTIN 45.2 (H) 10/07/2015   Lab Results  Component Value Date   CHOL 103 04/17/2017   TRIG 110 04/17/2017   HDL 34 (L) 04/17/2017   CHOLHDL 3.0 04/17/2017   VLDL 22 04/17/2017   LDLCALC 47 04/17/2017   LDLCALC 44 10/07/2015    Physical Findings: AIMS:  , ,  ,  ,    CIWA:  COWS:     Musculoskeletal: Strength & Muscle Tone: within normal limits Gait & Station: normal Patient leans: N/A  Psychiatric Specialty Exam: Physical Exam  Nursing note and vitals reviewed. Psychiatric: His affect is labile and inappropriate. His  speech is rapid and/or pressured and tangential. He is hyperactive. Thought content is paranoid and delusional. Cognition and memory are normal. He expresses impulsivity.    Review of Systems  Constitutional: Negative.   HENT: Negative.   Eyes: Negative.   Respiratory: Negative.   Cardiovascular: Negative.   Gastrointestinal: Negative.   Genitourinary: Negative.   Musculoskeletal: Negative.   Skin: Negative.   Neurological: Negative.   Endo/Heme/Allergies: Negative.   Psychiatric/Behavioral: The patient has insomnia.     Blood pressure 132/80, pulse 93, temperature 97.9 F (36.6 C), temperature source Oral, resp. rate 18, height  (1.753 m), weight 111.6 kg (246 lb), SpO2 100 %.Body mass index is 36.33 kg/m.  General Appearance: Disheveled  Eye Contact:  Good  Speech:  Pressured  Volume:  Increased  Mood:  Euphoric  Affect:  Inappropriate  Thought Process:  Disorganized and Descriptions of Associations: Loose  Orientation:  Full (Time, Place, and Person)  Thought Content:  Delusions and Paranoid Ideation  Suicidal Thoughts:  No  Homicidal Thoughts:  No  Memory:  Immediate;   Fair Recent;   Fair Remote;   Fair  Judgement:  Poor  Insight:  Lacking  Psychomotor Activity:  Increased  Concentration:  Concentration: Poor and Attention Span: Poor  Recall:  Poor  Fund of Knowledge:  Fair  Language:  Fair  Akathisia:  No  Handed:  Right  AIMS (if indicated):     Assets:  Communication Skills Desire for Improvement Financial Resources/Insurance Housing Leisure Time Resilience Social Support  ADL's:  Intact  Cognition:  WNL  Sleep:  Number of Hours: 6.75     Treatment Plan Summary: Daily contact with patient to assess and evaluate symptoms and progress in treatment and Medication management   Greg [name] is a 51 year old male with history of schizoaffective disorder admitted floridly manic in spite of reasonable medication compliance.  1. Mood and psychosis. He  has been maintained on a combination of oral Invega, lithium, and Depakote. We continue all his medications. The patient agreed to [place] injection. Hopefully, it will be given today. Order renewed. I will start Clozapine 50 mg tonight.   2. Insomnia. Slept better with Restoril.  3. Hypertension. He is on metoprolol.   4. Diabetes. He is on ADA diet, Lantus and metformin, blood glucose monitoring, SSI. Input from diabetes nurse coordinator is appreciated. Living well with diabetes book ordered.  5. Smoking. Nicotine patch is available.  6. Sleep apnea. He is on CPAP.  7. Metabolic syndrome monitoring. Lipid panel and TSH are normal, hemoglobin A1c is 10.9.   8. EKG. Normal sinus rhythm, QTc 437.   9. Social. He lives with her elderly parents who no longer feel they can provide care. They will need advice on guardianship process and placement.  10. Disposition. To be established. He will likely be discharged back to his parents. He will follow up with [facility].   Kristine Linea, MD 04/19/2017, 6:44 PM

## 2017-04-19 NOTE — B[facility] Group Notes (Signed)
[facility] Group Notes:  (Nursing/MHT/Case Management/Adjunct)  Date:  04/19/2017  Time:  12:49 AM  Type of Therapy:  Evening Wrap-up Group  Participation Level:  Active  Participation Quality:  Appropriate and Attentive  Affect:  Appropriate  Cognitive:  Alert and Appropriate  Insight:  Appropriate, Good and Improving  Engagement in Group:  Developing/Improving and Engaged  Modes of Intervention:  Discussion  Summary of Progress/Problems:  Greg [name] 04/19/2017, 12:49 AM

## 2017-04-19 NOTE — BHH Counselor (Signed)
Adult Comprehensive Assessment  Patient ID: Greg [name], male   DOB: 1966-07-11, 51 y.o.   MRN: [id number]  Information Source: Information source: Patient  Current Stressors:  Educational / Learning stressors:  (Pt reports he was becoming manic and needed admission.)  Living/Environment/Situation:  Living Arrangements: Parent (Mom and dad) Living conditions (as described by patient or guardian): Pt lives in apartment over the garage. How long has patient lived in current situation?: since 1991. What is atmosphere in current home: Supportive  Family History:  Marital status: Single (one marriage annulled?) Are you sexually active?: No What is your sexual orientation?: Heterosexual  Has your sexual activity been affected by drugs, alcohol, medication, or emotional stress?: na Does patient have children?: No  Childhood History:  By whom was/is the patient raised?: Both parents Additional childhood history information: I was raised with a silver spoon in my mouth.  Got what i wanted. Description of patient's relationship with caregiver when they were a child: wonderfully got along Patient's description of current relationship with people who raised him/her: pt reports current relationship is still wonderful How were you disciplined when you got in trouble as a child/adolescent?: spanking, appropriate Does patient have siblings?: Yes Number of Siblings: 1 Description of patient's current relationship with siblings: Sister who passed away in 49.  Did patient suffer any verbal/emotional/physical/sexual abuse as a child?: No Did patient suffer from severe childhood neglect?: No Has patient ever been sexually abused/assaulted/raped as an adolescent or adult?: No Was the patient ever a victim of a crime or a disaster?: No Witnessed domestic violence?: No Has patient been effected by domestic violence as an adult?: No  Education:  Highest grade of school patient has completed: 2  years of college, GED Currently a Consulting civil engineer?: No Learning disability?: No  Employment/Work Situation:   Employment situation: On disability Why is patient on disability: Mental illness How long has patient been on disability: 25 years Patient's job has been impacted by current illness:  (na) What is the longest time patient has a held a job?: 1.5 years 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 Has patient ever been in the [employer]?: No Are There Guns or Other Weapons in Your Home?: No  Financial Resources:   Surveyor, quantity resources: Writer Does patient have a Lawyer or guardian?: No  Alcohol/Substance Abuse:   What has been your use of drugs/alcohol within the last 12 months?: pt denies alcohol or drug use If attempted suicide, did drugs/alcohol play a role in this?: No Alcohol/Substance Abuse Treatment Hx: Denies past history Has alcohol/substance abuse ever caused legal problems?: Yes (1992 DWI)  Social Support System:   Patient's Community Support System: Fair Museum/gallery exhibitions officer System: elderly parents, friend Greg [name] Type of faith/religion: [employer] How does patient's faith help to cope with current illness?: "everything" God knows my thoughts, I don't need to pray.  Leisure/Recreation:   Leisure and Hobbies: Painting, playing guitar, writing poetry   Strengths/Needs:   What things does the patient do well?: Unable to answer-"everything going good" In what areas does patient struggle / problems for patient: I wish I had a girlfriend  Discharge Plan:   Does patient have access to transportation?: Yes (parents) Will patient be returning to same living situation after discharge?: Yes Currently receiving community mental health services: Yes (From Whom) Greg [name]) Does patient have financial barriers related to discharge medications?: No  Summary/Recommendations:   Summary and Recommendations (to be completed by the  evaluator): Pt is  51 year old male from [place]. [facility])  Pt is diagnosed with schizoaffective disorder and was admitted due to threatening and bizarre behavior.  Recommendations for pt include crisis stabilization, therapeutic milieu, attend and participate in groups, medication management, and development of comprehensive mental wellness plan.  Upon discharge, pt will continue outpt services at [facility].  Greg [name]. 04/19/2017

## 2017-04-19 NOTE — BHH Group Notes (Signed)
LCSW Group Therapy Note  04/19/2017 1:15pm  Type of Therapy and Topic:  Group Therapy:  Feelings around Relapse and Recovery  Participation Level:  Did Not Attend   Description of Group:    Patients in this group will discuss emotions they experience before and after a relapse. They will process how experiencing these feelings, or avoidance of experiencing them, relates to having a relapse. Facilitator will guide patients to explore emotions they have related to recovery. Patients will be encouraged to process which emotions are more powerful. They will be guided to discuss the emotional reaction significant others in their lives may have to their relapse or recovery. Patients will be assisted in exploring ways to respond to the emotions of others without this contributing to a relapse.  Therapeutic Goals: 1. Patient will identify two or more emotions that lead to a relapse for them 2. Patient will identify two emotions that result when they relapse 3. Patient will identify two emotions related to recovery 4. Patient will demonstrate ability to communicate their needs through discussion and/or role plays   Summary of Patient Progress:     Therapeutic Modalities:   Cognitive Behavioral Therapy Solution-Focused Therapy Assertiveness Training Relapse Prevention Therapy   Sallee Lange, LCSW 04/19/2017 3:02 PM

## 2017-04-19 NOTE — Progress Notes (Signed)
Inpatient Diabetes Program Recommendations  AACE/ADA: New Consensus Statement on Inpatient Glycemic Control (2015)  Target Ranges:  Prepandial:   less than 140 mg/dL      Peak postprandial:   less than 180 mg/dL (1-2 hours)      Critically ill patients:  140 - 180 mg/dL   Lab Results  Component Value Date   GLUCAP 138 (H) 04/19/2017   HGBA1C 10.9 (H) 04/17/2017     Inpatient Diabetes Program Recommendations:  If appropriate, please order the Living Well with Diabetes book for the patient through the manage orders tab.    Susette Racer, RN, BA, MHA, CDE Diabetes Coordinator Inpatient Diabetes Program  [phone] (Team Pager) [phone] [facility] Office) 04/19/2017 11:21 AM

## 2017-04-19 NOTE — Progress Notes (Signed)
APS report made to Jennette Kettle, [place] [facility].  [phone].  She took the report and also referred to adult guardianship worker Sander Radon, [phone].  Sander Radon will contact the family about guardianship options.  Dendra will refer APS report for screening.  CSW could not file the APS report based on concerns for the safety of pt parents due to the patient not being a caretaker.  Report filed based on concerns for self neglect of the patient. (not taking meds, impaired judgement) Garner Nash, MSW, LCSW Clinical Social Worker 04/19/2017 3:32 PM

## 2017-04-19 NOTE — Progress Notes (Signed)
D: Pt denies SI/HI/AVH, affect is flat but brightens upon approach. Pt is pleasant and cooperative, his thoughts are disorganized, speech is soft but tangential. Pt appears less anxious and he is interacting with peers and staff appropriately.  A: Pt was offered support and encouragement. Pt was given scheduled medications. Pt was encouraged to attend groups. Q 15 minute checks were done for safety.  R:Pt attends groups and interacts well with peers and staff. Pt is taking medication. Pt has no complaints.Pt receptive to treatment and safety maintained on unit.

## 2017-04-19 NOTE — B[facility] Group Notes (Signed)
[facility] Group Notes:  (Nursing/MHT/Case Management/Adjunct)  Date:  04/19/2017  Time:  10:01 AM  Type of Therapy:  Psychoeducational Skills  Participation Level:  Did Not Attend  Twanna Hy 04/19/2017, 10:01 AM
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
## 2017-04-19 NOTE — Progress Notes (Signed)
Patient up on the unit he is social with select peers. He does enjoy time outside on the grounds. He is med compliant. He makes good eye contact. The patient denies si, hi, avh. Denies pain. He states he feels like he is at base line and wants to go home. He states that he feels like if he stays here any longer he will go  Down hill. He is pleasant and cooperative attends groups. Will continue to monitor.

## 2017-04-20 LAB — GLUCOSE, CAPILLARY
Glucose-Capillary: 107 mg/dL — ABNORMAL HIGH (ref 65–99)
Glucose-Capillary: 149 mg/dL — ABNORMAL HIGH (ref 65–99)
Glucose-Capillary: 154 mg/dL — ABNORMAL HIGH (ref 65–99)
Glucose-Capillary: 184 mg/dL — ABNORMAL HIGH (ref 65–99)
Glucose-Capillary: 97 mg/dL (ref 65–99)

## 2017-04-20 MED ORDER — CLOZAPINE 25 MG PO TABS
75.0000 mg | ORAL_TABLET | Freq: Every day | ORAL | Status: DC
  Administered 2017-04-20 – 2017-04-21 (×2): 75 mg via ORAL
  Filled 2017-04-20 (×2): qty 3

## 2017-04-20 MED ORDER — PALIPERIDONE PALMITATE 234 MG/1.5ML IM SUSP: 234.0000 mg | INTRAMUSCULAR | Status: DC

## 2017-04-20 NOTE — Plan of Care (Signed)
Problem: Safety: Goal: Ability to remain free from injury will improve Outcome: Progressing Pt remains safe while in hospital injury free.    

## 2017-04-20 NOTE — [facility] Group Notes (Signed)
[facility] Group Notes:  (Nursing/MHT/Case Management/Adjunct)  Date:  04/20/2017  Time:  1:07 AM  Type of Therapy:  Psychoeducational Skills  Participation Level:  Active  Participation Quality:  Appropriate, Attentive and Sharing  Affect:  Appropriate  Cognitive:  Appropriate  Insight:  Appropriate and Good  Engagement in Group:  Engaged  Modes of Intervention:  Discussion, Socialization and Support  Summary of Progress/Problems:  Chancy Milroy 04/20/2017, 1:07 AM

## 2017-04-20 NOTE — Progress Notes (Signed)
Clozapine REMS Monitoring:  Patient ordered clozapine 50 mg at bedtime. Was not taking PTA per med rec.  ANC 4800 on 9/21. Labs submitted to clozapine registry. Patient eligible to receive clozapine with weekly lab monitoring.  Will recheck CBC w/diff in 1 week.  Cindi Carbon, PharmD 04/20/17 9:48 AM

## 2017-04-20 NOTE — Progress Notes (Signed)
[facility] MD Progress Note  04/20/2017 3:02 PM Greg [name]  MRN:  [id number]  Subjective:    History of present illness. Information was obtained from the patient, the chart, and his mother on the phone. The patient was completed by his mother for escalating symptoms of mania with psychosis. The patient became threatening to his elderly parents, refused to take some of his medications, has not been able to sleep, and has been increasingly agitated. He also states started bizarre behavior with consuming large volumes of salt. He has also been neglectful his medical problems. He stopped taking his insulin and blood pressure medication believing that God cured him. The mother reports that the patient has been only partially compliant with medications, picking and choosing what to take. On admission his Lithium level was therapeutic while Depakote level was low. The patient has been taking oral Invega but he is very skilful to cheek his medication. He was on [place] in the past with good results. It is unclear why Invega injections were stopped. According to the mother, all summer long, the patient has been standing in the throat house naked with a necklace made O pieces of concrete, greeting passersby cars, and protesting against abortion. He lately been rather delusional believing that he is God. He grew his hear over the summer but recently buzz cut some of it.   04/18/2017. Greg [name] continues to be intrusive, irritable, loud and easily agitated. He took his medications. Hinda Glatter sustenna was not given as it has not arrived from the pharmacy. We had a discussion about starting Clozapine. The patient has never taken it but "heard about it". I had a long conversation with his mother who is in support. The patient has not been well all summer long  But only recently became threatening to his elderly mother. He has no somatic compalints. Slept better, appetite is good. He isolates in his room but is  approachable and even funny but goofy.  04/19/2017. Greg [name] seem slightly better. He is still restless and irritable but able to engage in conversation with me and with one of his peers. Somehow, Invega sustenna injection ordered on 9/19 has not been given. I renewed the order. The patient is compliant with medications and tolerates them well. There are no somatic complaints. He slept 6 hours with Restoril. We again discussed Clozapine addition. Everybody seems to like the idea but his poor compliance might be a problem. I will order CBC and register the patient.  Follow-up for the 22nd on Saturday. Patient was pleasant in interaction today. He has not been aggressive loud or very dramatic today so far. Patient has no complaints about the clozapine. Slept more adequately last night. Denies suicidal ideation. Still smiles an awful lot and seems a little odd in his thinking but not intrusively bizarre  Per nursing: D: Pt denies SI/HI/AVH, affect is flat but brightens upon approach. Pt is pleasant and cooperative, his thoughts are disorganized, speech is soft but tangential. Pt appears less anxious and he is interacting with peers and staff appropriately.  A: Pt was offered support and encouragement. Pt was given scheduled medications. Pt was encouraged to attend groups. Q 15 minute checks were done for safety.  R:Pt attends groups and interacts well with peers and staff. Pt is taking medication. Pt has no complaints.Pt receptive to treatment and safety maintained on unit.  Principal Problem: Schizoaffective disorder, bipolar type (HCC) Diagnosis:   Patient Active Problem List   Diagnosis Date Noted  .  Schizoaffective disorder, bipolar type (HCC) [F25.0] 03/28/2016  . Acute cholecystitis with chronic cholecystitis [K81.2] 03/27/2016  . Anxiety [F41.9]   . 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 Bipolar 2 disorder (HCC) [F31.81]   . Hyponatremia [E87.1] 10/06/2015  . Essential hypertension  [I10] 11/28/2014  . Dyslipidemia [E78.5] 11/28/2014  . Obesity [E66.9] 11/28/2014  . Diabetes (HCC) [E11.9] 11/28/2014  . Tobacco use disorder [F17.200] 11/28/2014   Total Time spent with patient: 30 minutes  Past Psychiatric History: long history of bipolar manic episodes, multiple hospitalizations and medication trials, poor treatment compliance.  Past Medical History:  Past Medical History:  Diagnosis Date  . Bipolar 1 disorder (HCC)   . Diabetes (HCC)   . Dyslipidemia   . HTN (hypertension)   . Obesity   . Paranoia (HCC)   . Schizo affective schizophrenia [facility])     Past Surgical History:  Procedure Laterality Date  . APPENDECTOMY  51 years old  . CHOLECYSTECTOMY N/A 03/30/2016   Procedure: LAPAROSCOPIC CHOLECYSTECTOMY;  Surgeon: Gladis Riffle, MD;  Location: [facility] ORS;  Service: General;  Laterality: N/A;  . TONSILLECTOMY  51 year old   Family History:  Family History  Problem Relation Age of Onset  . Depression Mother   . Mental illness Mother   . Depression Father   . Mental illness Father   . Depression Sister   . Mental illness Sister    Family Psychiatric  History: grandmother with schizophrenia. Social History:  History  Alcohol Use No     History  Drug Use No    Social History   Social History  . Marital status: Single    Spouse name: N/A  . Number of children: N/A  . Years of education: N/A   Social History Main Topics  . Smoking status: Current Every Day Smoker    Packs/day: 0.50    Years: 8.00    Types: Cigarettes  . Smokeless tobacco: Current User    Types: Snuff  . Alcohol use No  . Drug use: No  . Sexual activity: No   Other Topics Concern  . None   Social History Narrative  . None   Additional Social History: the patient is disabled from mental illness. He lives with his elderly parents who no longer are able to care for him.                        Sleep: Fair  Appetite:  Fair  Current Medications: Current  Facility-Administered Medications  Medication Dose Route Frequency Provider Last Rate Last Dose  . acetaminophen (TYLENOL) tablet 650 mg  650 mg Oral Q6H PRN Broughton Eppinger T, MD      . alum & mag hydroxide-simeth (MAALOX/MYLANTA) 200-200-20 MG/5ML suspension 30 mL  30 mL Oral Q4H PRN Franciszek Platten T, MD      . cloZAPine (CLOZARIL) tablet 50 mg  50 mg Oral QHS Pucilowska, Jolanta B, MD   50 mg at 04/19/17 2206  . divalproex (DEPAKOTE) DR tablet 750 mg  750 mg Oral Q12H Katianne Barre T, MD   750 mg at 04/20/17 0744  . hydrOXYzine (ATARAX/VISTARIL) tablet 50 mg  50 mg Oral TID PRN Conan Mcmanaway, [name], MD   50 mg at 04/17/17 0039  . insulin aspart (novoLOG) injection 0-15 Units  0-15 Units Subcutaneous TID WC Pucilowska, Jolanta B, MD   3 Units at 04/20/17 1149  . insulin aspart (novoLOG) injection 0-5 Units  0-5 Units Subcutaneous QHS  Pucilowska, Jolanta B, MD      . insulin glargine (LANTUS) injection 10 Units  10 Units Subcutaneous Daily Chrisa Hassan, [name], MD   10 Units at 04/19/17 2207  . lithium carbonate (ESKALITH) CR tablet 450 mg  450 mg Oral Q12H Masaye Gatchalian, [name], MD   450 mg at 04/20/17 0744  . magnesium hydroxide (MILK OF MAGNESIA) suspension 30 mL  30 mL Oral Daily PRN Jovonna Nickell T, MD      . metFORMIN (GLUCOPHAGE) tablet 1,000 mg  1,000 mg Oral BID WC Hillary Struss, [name], MD   1,000 mg at 04/20/17 0744  . metoprolol succinate (TOPROL-XL) 24 hr tablet 50 mg  50 mg Oral Daily Sharlon Pfohl, [name], MD   50 mg at 04/20/17 0744  . nicotine (NICODERM CQ - dosed in mg/24 hours) patch 21 mg  21 mg Transdermal Daily Pucilowska, Jolanta B, MD   21 mg at 04/20/17 0747  . [START ON 05/17/2017] paliperidone (INVEGA SUSTENNA) injection 234 mg  234 mg Intramuscular Q28 days Pucilowska, Jolanta B, MD      . paliperidone (INVEGA) 24 hr tablet 6 mg  6 mg Oral QHS Khylei Wilms T, MD   6 mg at 04/19/17 2126  . pneumococcal 23 valent vaccine (PNU-IMMUNE) injection 0.5 mL  0.5 mL Intramuscular Tomorrow-1000 Pucilowska,  Jolanta B, MD      . temazepam (RESTORIL) capsule 30 mg  30 mg Oral QHS Pucilowska, Jolanta B, MD   30 mg at 04/19/17 2206    Lab Results:  Results for orders placed or performed during the hospital encounter of 04/16/17 (from the past 48 hour(s))  Glucose, capillary     Status: Abnormal   Collection Time: 04/18/17  5:06 PM  Result Value Ref Range   Glucose-Capillary 186 (H) 65 - 99 mg/dL  Glucose, capillary     Status: Abnormal   Collection Time: 04/18/17  9:43 PM  Result Value Ref Range   Glucose-Capillary 184 (H) 65 - 99 mg/dL  Glucose, capillary     Status: Abnormal   Collection Time: 04/19/17  7:01 AM  Result Value Ref Range   Glucose-Capillary 138 (H) 65 - 99 mg/dL   Comment 1 Notify RN    Comment 2 Document in Chart   Glucose, capillary     Status: Abnormal   Collection Time: 04/19/17 11:43 AM  Result Value Ref Range   Glucose-Capillary 158 (H) 65 - 99 mg/dL   Comment 1 Notify RN   Glucose, capillary     Status: None   Collection Time: 04/19/17  4:31 PM  Result Value Ref Range   Glucose-Capillary 93 65 - 99 mg/dL   Comment 1 Notify RN   CBC with Differential/Platelet     Status: None   Collection Time: 04/19/17  7:43 PM  Result Value Ref Range   WBC 7.8 3.8 - 10.6 K/uL   RBC 4.82 4.40 - 5.90 MIL/uL   Hemoglobin 13.7 13.0 - 18.0 g/dL   HCT 16.1 09.6 - 04.5 %   MCV 84.9 80.0 - 100.0 fL   MCH 28.4 26.0 - 34.0 pg   MCHC 33.4 32.0 - 36.0 g/dL   RDW 40.9 81.1 - 91.4 %   Platelets 269 150 - 440 K/uL   Neutrophils Relative % 60 %   Neutro Abs 4.8 1.4 - 6.5 K/uL   Lymphocytes Relative 27 %   Lymphs Abs 2.1 1.0 - 3.6 K/uL   Monocytes Relative 9 %   Monocytes Absolute 0.7 0.2 -  1.0 K/uL   Eosinophils Relative 3 %   Eosinophils Absolute 0.2 0 - 0.7 K/uL   Basophils Relative 1 %   Basophils Absolute 0.0 0 - 0.1 K/uL  Glucose, capillary     Status: Abnormal   Collection Time: 04/19/17  8:27 PM  Result Value Ref Range   Glucose-Capillary 142 (H) 65 - 99 mg/dL  Glucose,  capillary     Status: Abnormal   Collection Time: 04/20/17 12:21 AM  Result Value Ref Range   Glucose-Capillary 149 (H) 65 - 99 mg/dL  Glucose, capillary     Status: Abnormal   Collection Time: 04/20/17  6:56 AM  Result Value Ref Range   Glucose-Capillary 154 (H) 65 - 99 mg/dL  Glucose, capillary     Status: Abnormal   Collection Time: 04/20/17 11:49 AM  Result Value Ref Range   Glucose-Capillary 184 (H) 65 - 99 mg/dL   Comment 1 Notify RN     Blood Alcohol level:  Lab Results  Component Value Date   ETH <5 04/14/2017   ETH <5 10/06/2015    Metabolic Disorder Labs: Lab Results  Component Value Date   HGBA1C 10.9 (H) 04/17/2017   MPG 266.13 04/17/2017   MPG 154 02/09/2015   Lab Results  Component Value Date   PROLACTIN 43.3 (H) 04/17/2017   PROLACTIN 45.2 (H) 10/07/2015   Lab Results  Component Value Date   CHOL 103 04/17/2017   TRIG 110 04/17/2017   HDL 34 (L) 04/17/2017   CHOLHDL 3.0 04/17/2017   VLDL 22 04/17/2017   LDLCALC 47 04/17/2017   LDLCALC 44 10/07/2015    Physical Findings: AIMS:  , ,  ,  ,    CIWA:    COWS:     Musculoskeletal: Strength & Muscle Tone: within normal limits Gait & Station: normal Patient leans: N/A  Psychiatric Specialty Exam: Physical Exam  Nursing note and vitals reviewed. Psychiatric: His speech is normal. His affect is labile. His affect is not inappropriate. He is hyperactive. Thought content is delusional. Thought content is not paranoid. Cognition and memory are normal. He expresses impulsivity.    Review of Systems  Constitutional: Negative.   HENT: Negative.   Eyes: Negative.   Respiratory: Negative.   Cardiovascular: Negative.   Gastrointestinal: Negative.   Genitourinary: Negative.   Musculoskeletal: Negative.   Skin: Negative.   Neurological: Negative.   Endo/Heme/Allergies: Negative.   Psychiatric/Behavioral: The patient has insomnia.     Blood pressure 129/74, pulse (!) 102, temperature 98.5 F (36.9  C), temperature source Oral, resp. rate 18, height  (1.753 m), weight 111.6 kg (246 lb), SpO2 100 %.Body mass index is 36.33 kg/m.  General Appearance: Disheveled  Eye Contact:  Good  Speech:  Pressured  Volume:  Increased  Mood:  Euphoric  Affect:  Inappropriate  Thought Process:  Disorganized and Descriptions of Associations: Loose  Orientation:  Full (Time, Place, and Person)  Thought Content:  Delusions and Paranoid Ideation  Suicidal Thoughts:  No  Homicidal Thoughts:  No  Memory:  Immediate;   Fair Recent;   Fair Remote;   Fair  Judgement:  Poor  Insight:  Lacking  Psychomotor Activity:  Increased  Concentration:  Concentration: Poor and Attention Span: Poor  Recall:  Poor  Fund of Knowledge:  Fair  Language:  Fair  Akathisia:  No  Handed:  Right  AIMS (if indicated):     Assets:  Communication Skills Desire for Improvement Financial Resources/Insurance Housing Leisure Time Resilience  Social Support  ADL's:  Intact  Cognition:  WNL  Sleep:  Number of Hours: 6.15     Treatment Plan Summary: Daily contact with patient to assess and evaluate symptoms and progress in treatment and Medication management   Greg [name] is a 51 year old male with history of schizoaffective disorder admitted floridly manic in spite of reasonable medication compliance.  1. Mood and psychosis. He has been maintained on a combination of oral Invega, lithium, and Depakote. We continue all his medications. The patient agreed to [place] injection. Hopefully, it will be given today. Order renewed. I will start Clozapine 50 mg tonight.   2. Insomnia. Slept better with Restoril.  3. Hypertension. He is on metoprolol.   4. Diabetes. He is on ADA diet, Lantus and metformin, blood glucose monitoring, SSI. Input from diabetes nurse coordinator is appreciated. Living well with diabetes book ordered.  5. Smoking. Nicotine patch is available.  6. Sleep apnea. He is on  CPAP.  7. Metabolic syndrome monitoring. Lipid panel and TSH are normal, hemoglobin A1c is 10.9.   8. EKG. Normal sinus rhythm, QTc 437.   9. Social. He lives with her elderly parents who no longer feel they can provide care. They will need advice on guardianship process and placement.  10. Disposition. To be established. He will likely be discharged back to his parents. He will follow up with [facility].   Patient has started clozapine and is tolerating it well so far. We will probably continue to titrate up the dose again today. Continue engagement in groups in daily assessment on the unit.  Mordecai Rasmussen, MD 04/20/2017, 3:02 PM

## 2017-04-20 NOTE — Progress Notes (Signed)
Patient ID: Greg [name]., male   DOB: 01-Feb-1966, 51 y.o.   MRN: [id number] LCSW Group Therapy Note  04/20/2017 1:00pm  Type of Therapy and Topic:  Group Therapy:  Cognitive Distortions  Participation Level:  Minimal   Description of Group:    Patients in this group will be introduced to the topic of cognitive distortions.  Patients will identify and describe cognitive distortions, describe the feelings these distortions create for them.  Patients will identify one or more situations in their personal life where they have cognitively distorted thinking and will verbalize challenging this cognitive distortion through positive thinking skills.  Patients will practice the skill of using positive affirmations to challenge cognitive distortions using affirmation cards.    Therapeutic Goals:  1. Patient will identify two or more cognitive distortions they have used 2. Patient will identify one or more emotions that stem from use of a cognitive distortion 3. Patient will demonstrate use of a positive affirmation to counter a cognitive distortion through discussion and/or role play. 4. Patient will describe one way cognitive distortions can be detrimental to wellness   Summary of Patient Progress: Pt able to meet the above therapeutic goals.  Pt verbalizes that he can use positive affirmations to help with some of his negative thoughts. CSW utilized "Unhelpful thinking" handout and "Power Thought" cards to demonstrate benefits of affirmations.  Therapeutic Modalities:   Cognitive Behavioral Therapy Motivational Interviewing   Glennon Mac, LCSW 04/20/2017 3:54 PM

## 2017-04-20 NOTE — Plan of Care (Signed)
Problem: Safety: Goal: Ability to redirect hostility and anger into socially appropriate behaviors will improve Outcome: Progressing Patient was without evidence of anger or hostility.  He was observed staying in his room most of the time but when with peers, he engaged briefly in "small talk".  He denies SI/HI/AV/H and contracts for safety.   Goal: Ability to remain free from injury will improve Outcome: Progressing Patient was given literature on diabetes management which he said he would read in the morning.  He asked appropriate questions about his medications during administration. He was observed sleeping after 2200 but came to the medication room at 0030, asking for "sugar or splenda".  At that time, he was observed to be weak and appeared lethargic or sleepy.  He had beads of perspiration on his forehead and cheeks.  Patient stated, "my sugar is low and I need food". BG was 149. Patient was anxious but also having trouble staying awake.  He was given 1 graham cracker pack and ice water.  He responded to reassurance about his BG and safety.  He was observed sleeping on the next rounds.  Problem: Self-Care: Goal: Ability to participate in self-care as condition permits will improve Outcome: Not Progressing Writer observed Patient in his room and offered to assistance in collecting trash.  Patient stated he would do it before bedtime.  He also asked for toiletries for a shower and did not take a shower this evening.  He was encouraged to sleep in scrubs but insisted on sleeping in his jeans and t-shirt.

## 2017-04-20 NOTE — Progress Notes (Signed)
Pt appears to be in a brighter mood. He denies SI, HI, a/v hallucinations. He is compliant with medications. He commits to safety on unit. No aggressive or hostile behaviors noted. Will continue to monitor for safety.

## 2017-04-21 LAB — URINALYSIS, COMPLETE (UACMP) WITH MICROSCOPIC
Bacteria, UA: NONE SEEN
Bilirubin Urine: NEGATIVE
Glucose, UA: NEGATIVE mg/dL
Hgb urine dipstick: NEGATIVE
Ketones, ur: 5 mg/dL — AB
Leukocytes, UA: NEGATIVE
Nitrite: NEGATIVE
Protein, ur: 30 mg/dL — AB
RBC / HPF: NONE SEEN RBC/hpf (ref 0–5)
Specific Gravity, Urine: 1.024 (ref 1.005–1.030)
Squamous Epithelial / LPF: NONE SEEN
pH: 6 (ref 5.0–8.0)

## 2017-04-21 LAB — GLUCOSE, CAPILLARY
Glucose-Capillary: 141 mg/dL — ABNORMAL HIGH (ref 65–99)
Glucose-Capillary: 120 mg/dL — ABNORMAL HIGH (ref 65–99)
Glucose-Capillary: 133 mg/dL — ABNORMAL HIGH (ref 65–99)
Glucose-Capillary: 102 mg/dL — ABNORMAL HIGH (ref 65–99)

## 2017-04-21 MED ORDER — TEMAZEPAM 15 MG PO CAPS
15.0000 mg | ORAL_CAPSULE | Freq: Every day | ORAL | Status: DC
  Administered 2017-04-21: 15 mg via ORAL
  Filled 2017-04-21: qty 1

## 2017-04-21 NOTE — Progress Notes (Signed)
This morning patient was very drowsy and unable to stand nor walk by himself. Patient escorted back to his room by nurse. Patient sleep a few more hours and noted coherence and clear thought process exhibited by patient. Patient up by mid morning with a steady gait. Complaint with meals and medications, present in the community interacting with select peers. Will continue to monitor for safety.

## 2017-04-21 NOTE — Plan of Care (Signed)
Problem: Activity: Goal: Interest or engagement in activities will improve Patient is present int he community but does not attend group sessions.  Problem: Education: Goal: Knowledge of [place] [facility] Education information/materials will improve Outcome: Progressing Patient verbalizes understanding of [place] Policies.

## 2017-04-21 NOTE — Plan of Care (Signed)
Problem: Safety: Goal: Ability to remain free from injury will improve Outcome: Progressing Patient remains free form injury.

## 2017-04-21 NOTE — [facility] Group Notes (Signed)
[facility] LCSW Group Therapy 04/21/2017 1:15pm  Type of Therapy: Group Therapy- Feelings Around Discharge & Establishing a Supportive Framework  Participation Level:  None  Description of Group:   What is a supportive framework? What does it look like feel like and how do I discern it from and unhealthy non-supportive network? Learn how to cope when supports are not helpful and don't support you. Discuss what to do when your family/friends are not supportive.  Summary of Patient Progress Pt did not participate in group discussion; arrived at the end of group and was attentive throughout.   Therapeutic Modalities:   Cognitive Behavioral Therapy Person-Centered Therapy Motivational Interviewing   Verdene Lennert, LCSW 04/21/2017 2:39 PM

## 2017-04-21 NOTE — Plan of Care (Signed)
Problem: Coping: Goal: Ability to cope will improve Outcome: Progressing Patient is observed socializing with a male peer and seems to enjoy conversations with others.  He participated in the group outdoors and had snack in the dayroom.  He was observed to be clean and dressed in clean street clothes this evening.  He indicates he is motivated to be in charge of his medications and diabetes care.    Problem: Safety: Goal: Ability to remain free from injury will improve Outcome: Progressing As last evening, when Patient  Receives his medications, he goes immediately to bed in his street clothes including his shoes.  Within the hour, he is up walking and appears to be walking in his sleep.  When approached, he said he was looking for the bath room.  He was escorted back to his room and a foul smell of urine was noted.  He was directed to the bathroom and seemed to fall asleep at the sink, after using the toilet.  He removed his shoes but refused to dress in scrubs for sleeping.  He also insists on having the light on all night.  He is heard snoring and having periods of apnea while asleep.

## 2017-04-21 NOTE — Progress Notes (Signed)
[facility] MD Progress Note  04/21/2017 2:41 PM Greg Greg [name].  MRN:  [id number]  Subjective:    History of present illness. Information was obtained from Greg Greg [name], Greg chart, and his mother on Greg phone. Greg Greg [name] was completed by his mother for escalating symptoms of mania with psychosis. Greg Greg [name] became threatening to his elderly parents, refused to take some of his medications, has not been able to sleep, and has been increasingly agitated. He also states started bizarre behavior with consuming large volumes of salt. He has also been neglectful his medical problems. He stopped taking his insulin and blood pressure medication believing that God cured him. Greg mother reports that Greg Greg [name] has been only partially compliant with medications, picking and choosing what to take. On admission his Lithium level was therapeutic while Depakote level was low. Greg Greg [name] has been taking oral Invega but he is very skilful to cheek his medication. He was on [place] in Greg past with good results. It is unclear why Invega injections were stopped. According to Greg mother, all summer long, Greg Greg [name] has been standing in Greg throat house naked with a necklace made O pieces of concrete, greeting passersby cars, and protesting against abortion. He lately been rather delusional believing that he is God. He grew his hear over Greg summer but recently buzz cut some of it.   04/18/2017. Greg Greg [name] continues to be intrusive, irritable, loud and easily agitated. He took his medications. Greg Greg [name] sustenna was not given as it has not arrived from Greg pharmacy. We had a discussion about starting Clozapine. Greg Greg [name] has never taken it but "heard about it". I had a long conversation with his mother who is in support. Greg Greg [name] has not been well all summer long  But only recently became threatening to his elderly mother. He has no somatic compalints. Slept better, appetite is good. He isolates in his room but is  approachable and even funny but goofy.  04/19/2017. Greg Greg [name] seem slightly better. He is still restless and irritable but able to engage in conversation with me and with one of his peers. Somehow, Invega sustenna injection ordered on 9/19 has not been given. I renewed Greg order. Greg Greg [name] is compliant with medications and tolerates them well. There are no somatic complaints. He slept 6 hours with Restoril. We again discussed Clozapine addition. Everybody seems to like Greg idea but his poor compliance might be a problem. I will order CBC and register Greg Greg [name].  Follow-up for Greg 22nd on Saturday. Greg [name] was pleasant in interaction today. He has not been aggressive loud or very dramatic today so far. Greg [name] has no complaints about Greg clozapine. Slept more adequately last night. Denies suicidal ideation. Still smiles an awful lot and seems a little odd in his thinking but not intrusively bizarre  Follow-up for today Sunday Greg 23rd. Greg [name] has no complaints. He says he feels like he is doing fine. He interacts with some other patients appropriately. Has not been aggressive or threatening and is able to hold conversations without getting into his psychotic delusions. Appears to be compliant with medicine. Nurses report that in Greg morning he is extremely sedated and think perhaps he needs less sedating medicine at night. Greg nurses also thought that his urine had a foul smell and that he needed a urine analysis. Greg [name] seemed puzzled by that saying he had no problems with his urine. Greg urinalysis in fact is completely normal.  Per nursing: D: Pt denies  SI/HI/AVH, affect is flat but brightens upon approach. Pt is pleasant and cooperative, his thoughts are disorganized, speech is soft but tangential. Pt appears less anxious and he is interacting with peers and staff appropriately.  A: Pt was offered support and encouragement. Pt was given scheduled medications. Pt was encouraged to attend groups. Q  15 minute checks were done for safety.  R:Pt attends groups and interacts well with peers and staff. Pt is taking medication. Pt has no complaints.Pt receptive to treatment and safety maintained on unit.  Principal Problem: Schizoaffective disorder, bipolar type (HCC) Diagnosis:   Greg [name] Active Problem List   Diagnosis Date Noted  . Schizoaffective disorder, bipolar type (HCC) [F25.0] 03/28/2016  . Acute cholecystitis with chronic cholecystitis [K81.2] 03/27/2016  . Anxiety [F41.9]   . 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 Bipolar 2 disorder (HCC) [F31.81]   . Hyponatremia [E87.1] 10/06/2015  . Essential hypertension [I10] 11/28/2014  . Dyslipidemia [E78.5] 11/28/2014  . Obesity [E66.9] 11/28/2014  . Diabetes (HCC) [E11.9] 11/28/2014  . Tobacco use disorder [F17.200] 11/28/2014   Total Time spent with Greg [name]: 30 minutes  Past Psychiatric History: long history of bipolar manic episodes, multiple hospitalizations and medication trials, poor treatment compliance.  Past Medical History:  Past Medical History:  Diagnosis Date  . Bipolar 1 disorder (HCC)   . Diabetes (HCC)   . Dyslipidemia   . HTN (hypertension)   . Obesity   . Paranoia (HCC)   . Schizo affective schizophrenia [facility])     Past Surgical History:  Procedure Laterality Date  . APPENDECTOMY  51 years old  . CHOLECYSTECTOMY N/A 03/30/2016   Procedure: LAPAROSCOPIC CHOLECYSTECTOMY;  Surgeon: Greg Riffle, MD;  Location: [facility] ORS;  Service: General;  Laterality: N/A;  . TONSILLECTOMY  52 year old   Family History:  Family History  Problem Relation Age of Onset  . Depression Mother   . Mental illness Mother   . Depression Father   . Mental illness Father   . Depression Sister   . Mental illness Sister    Family Psychiatric  History: grandmother with schizophrenia. Social History:  History  Alcohol Use No     History  Drug Use No    Social History   Social History  . Marital status: Single     Spouse name: N/A  . Number of children: N/A  . Years of education: N/A   Social History Main Topics  . Smoking status: Current Every Day Smoker    Packs/day: 0.50    Years: 8.00    Types: Cigarettes  . Smokeless tobacco: Current User    Types: Snuff  . Alcohol use No  . Drug use: No  . Sexual activity: No   Other Topics Concern  . None   Social History Narrative  . None   Additional Social History: Greg Greg [name] is disabled from mental illness. He lives with his elderly parents who no longer are able to care for him.                        Sleep: Fair  Appetite:  Fair  Current Medications: Current Facility-Administered Medications  Medication Dose Route Frequency Provider Last Rate Last Dose  . acetaminophen (TYLENOL) tablet 650 mg  650 mg Oral Q6H PRN [name], [name], MD      . alum & mag hydroxide-simeth (MAALOX/MYLANTA) 200-200-20 MG/5ML suspension 30 mL  30 mL Oral Q4H PRN [name], [name], MD      .  cloZAPine (CLOZARIL) tablet 75 mg  75 mg Oral QHS [name], [name], MD   75 mg at 04/20/17 2114  . divalproex (DEPAKOTE) DR tablet 750 mg  750 mg Oral Q12H [name], [name], MD   750 mg at 04/21/17 0748  . hydrOXYzine (ATARAX/VISTARIL) tablet 50 mg  50 mg Oral TID PRN [name], [name], MD   50 mg at 04/17/17 0039  . insulin aspart (novoLOG) injection 0-15 Units  0-15 Units Subcutaneous TID WC Pucilowska, Jolanta B, MD   2 Units at 04/21/17 1153  . insulin aspart (novoLOG) injection 0-5 Units  0-5 Units Subcutaneous QHS Pucilowska, Jolanta B, MD      . insulin glargine (LANTUS) injection 10 Units  10 Units Subcutaneous Daily [name], [name], MD   10 Units at 04/20/17 2213  . lithium carbonate (ESKALITH) CR tablet 450 mg  450 mg Oral Q12H [name], [name], MD   450 mg at 04/21/17 0747  . magnesium hydroxide (MILK OF MAGNESIA) suspension 30 mL  30 mL Oral Daily PRN [name], [name], MD      . metFORMIN (GLUCOPHAGE) tablet 1,000 mg  1,000 mg Oral BID WC [name], [name], MD    1,000 mg at 04/21/17 0747  . metoprolol succinate (TOPROL-XL) 24 hr tablet 50 mg  50 mg Oral Daily [name], [name], MD   50 mg at 04/21/17 0747  . nicotine (NICODERM CQ - dosed in mg/24 hours) patch 21 mg  21 mg Transdermal Daily Pucilowska, Jolanta B, MD   21 mg at 04/21/17 0748  . [START ON 05/17/2017] paliperidone (INVEGA SUSTENNA) injection 234 mg  234 mg Intramuscular Q28 days Pucilowska, Jolanta B, MD      . paliperidone (INVEGA) 24 hr tablet 6 mg  6 mg Oral QHS [name], [name], MD   6 mg at 04/20/17 2114  . pneumococcal 23 valent vaccine (PNU-IMMUNE) injection 0.5 mL  0.5 mL Intramuscular Tomorrow-1000 Pucilowska, Jolanta B, MD      . temazepam (RESTORIL) capsule 15 mg  15 mg Oral QHS [name], [name], MD        Lab Results:  Results for orders placed or performed during Greg hospital encounter of 04/16/17 (from Greg past 48 hour(s))  Glucose, capillary     Status: None   Collection Time: 04/19/17  4:31 PM  Result Value Ref Range   Glucose-Capillary 93 65 - 99 mg/dL   Comment 1 Notify RN   CBC with Differential/Platelet     Status: None   Collection Time: 04/19/17  7:43 PM  Result Value Ref Range   WBC 7.8 3.8 - 10.6 K/uL   RBC 4.82 4.40 - 5.90 MIL/uL   Hemoglobin 13.7 13.0 - 18.0 g/dL   HCT 16.1 09.6 - 04.5 %   MCV 84.9 80.0 - 100.0 fL   MCH 28.4 26.0 - 34.0 pg   MCHC 33.4 32.0 - 36.0 g/dL   RDW 40.9 81.1 - 91.4 %   Platelets 269 150 - 440 K/uL   Neutrophils Relative % 60 %   Neutro Abs 4.8 1.4 - 6.5 K/uL   Lymphocytes Relative 27 %   Lymphs Abs 2.1 1.0 - 3.6 K/uL   Monocytes Relative 9 %   Monocytes Absolute 0.7 0.2 - 1.0 K/uL   Eosinophils Relative 3 %   Eosinophils Absolute 0.2 0 - 0.7 K/uL   Basophils Relative 1 %   Basophils Absolute 0.0 0 - 0.1 K/uL  Glucose, capillary     Status:  Abnormal   Collection Time: 04/19/17  8:27 PM  Result Value Ref Range   Glucose-Capillary 142 (H) 65 - 99 mg/dL  Glucose, capillary     Status: Abnormal   Collection Time: 04/20/17  12:21 AM  Result Value Ref Range   Glucose-Capillary 149 (H) 65 - 99 mg/dL  Glucose, capillary     Status: Abnormal   Collection Time: 04/20/17  6:56 AM  Result Value Ref Range   Glucose-Capillary 154 (H) 65 - 99 mg/dL  Glucose, capillary     Status: Abnormal   Collection Time: 04/20/17 11:49 AM  Result Value Ref Range   Glucose-Capillary 184 (H) 65 - 99 mg/dL   Comment 1 Notify RN   Glucose, capillary     Status: Abnormal   Collection Time: 04/20/17  4:20 PM  Result Value Ref Range   Glucose-Capillary 107 (H) 65 - 99 mg/dL  Glucose, capillary     Status: None   Collection Time: 04/20/17  8:21 PM  Result Value Ref Range   Glucose-Capillary 97 65 - 99 mg/dL  Glucose, capillary     Status: Abnormal   Collection Time: 04/21/17  7:05 AM  Result Value Ref Range   Glucose-Capillary 141 (H) 65 - 99 mg/dL  Glucose, capillary     Status: Abnormal   Collection Time: 04/21/17 11:46 AM  Result Value Ref Range   Glucose-Capillary 120 (H) 65 - 99 mg/dL  Urinalysis, Complete w Microscopic     Status: Abnormal   Collection Time: 04/21/17  2:04 PM  Result Value Ref Range   Color, Urine YELLOW (A) YELLOW   APPearance CLEAR (A) CLEAR   Specific Gravity, Urine 1.024 1.005 - 1.030   pH 6.0 5.0 - 8.0   Glucose, UA NEGATIVE NEGATIVE mg/dL   Hgb urine dipstick NEGATIVE NEGATIVE   Bilirubin Urine NEGATIVE NEGATIVE   Ketones, ur 5 (A) NEGATIVE mg/dL   Protein, ur 30 (A) NEGATIVE mg/dL   Nitrite NEGATIVE NEGATIVE   Leukocytes, UA NEGATIVE NEGATIVE   RBC / HPF NONE SEEN 0 - 5 RBC/hpf   WBC, UA 0-5 0 - 5 WBC/hpf   Bacteria, UA NONE SEEN NONE SEEN   Squamous Epithelial / LPF NONE SEEN NONE SEEN   Mucus PRESENT     Blood Alcohol level:  Lab Results  Component Value Date   ETH <5 04/14/2017   ETH <5 10/06/2015    Metabolic Disorder Labs: Lab Results  Component Value Date   HGBA1C 10.9 (H) 04/17/2017   MPG 266.13 04/17/2017   MPG 154 02/09/2015   Lab Results  Component Value Date    PROLACTIN 43.3 (H) 04/17/2017   PROLACTIN 45.2 (H) 10/07/2015   Lab Results  Component Value Date   CHOL 103 04/17/2017   TRIG 110 04/17/2017   HDL 34 (L) 04/17/2017   CHOLHDL 3.0 04/17/2017   VLDL 22 04/17/2017   LDLCALC 47 04/17/2017   LDLCALC 44 10/07/2015    Physical Findings: AIMS:  , ,  ,  ,    CIWA:    COWS:     Musculoskeletal: Strength & Muscle Tone: within normal limits Gait & Station: normal Greg [name] leans: N/A  Psychiatric Specialty Exam: Physical Exam  Nursing note and vitals reviewed. Psychiatric: His speech is normal. His affect is labile. His affect is not inappropriate. He is hyperactive. Thought content is delusional. Thought content is not paranoid. Cognition and memory are normal. He expresses impulsivity.    Review of Systems  Constitutional: Negative.  HENT: Negative.   Eyes: Negative.   Respiratory: Negative.   Cardiovascular: Negative.   Gastrointestinal: Negative.   Genitourinary: Negative.   Musculoskeletal: Negative.   Skin: Negative.   Neurological: Negative.   Endo/Heme/Allergies: Negative.   Psychiatric/Behavioral: Greg Greg [name] has insomnia.     Blood pressure 110/68, pulse 88, temperature 98.2 F (36.8 C), resp. rate 16, height  (1.753 m), weight 111.6 kg (246 lb), SpO2 97 %.Body mass index is 36.33 kg/m.  General Appearance: Disheveled  Eye Contact:  Good  Speech:  Pressured  Volume:  Increased  Mood:  Euphoric  Affect:  Inappropriate  Thought Process:  Disorganized and Descriptions of Associations: Loose  Orientation:  Full (Time, Place, and Person)  Thought Content:  Delusions and Paranoid Ideation  Suicidal Thoughts:  No  Homicidal Thoughts:  No  Memory:  Immediate;   Fair Recent;   Fair Remote;   Fair  Judgement:  Poor  Insight:  Lacking  Psychomotor Activity:  Increased  Concentration:  Concentration: Poor and Attention Span: Poor  Recall:  Poor  Fund of Knowledge:  Fair  Language:  Fair  Akathisia:  No   Handed:  Right  AIMS (if indicated):     Assets:  Communication Skills Desire for Improvement Financial Resources/Insurance Housing Leisure Time Resilience Social Support  ADL's:  Intact  Cognition:  WNL  Sleep:  Number of Hours: 7.15     Treatment Plan Summary: Daily contact with Greg [name] to assess and evaluate symptoms and progress in treatment and Medication management   Greg Greg [name] is a 51 year old male with history of schizoaffective disorder admitted floridly manic in spite of reasonable medication compliance.  1. Mood and psychosis. He has been maintained on a combination of oral Invega, lithium, and Depakote. We continue all his medications. Greg Greg [name] agreed to [place] injection. Hopefully, it will be given today. Order renewed. I will start Clozapine 50 mg tonight.   2. Insomnia. Slept better with Restoril.  3. Hypertension. He is on metoprolol.   4. Diabetes. He is on ADA diet, Lantus and metformin, blood glucose monitoring, SSI. Input from diabetes nurse coordinator is appreciated. Living well with diabetes book ordered.  5. Smoking. Nicotine patch is available.  6. Sleep apnea. He is on CPAP.  7. Metabolic syndrome monitoring. Lipid panel and TSH are normal, hemoglobin A1c is 10.9.   8. EKG. Normal sinus rhythm, QTc 437.   9. Social. He lives with her elderly parents who no longer feel they can provide care. They will need advice on guardianship process and placement.  10. Disposition. To be established. He will likely be discharged back to his parents. He will follow up with [facility].   Greg [name] has started clozapine and is tolerating it well so far. We will probably continue to titrate up Greg dose again today. Continue engagement in groups in daily assessment on Greg unit.  We will leave Greg clozapine at Greg current dose since Greg nurses are concerned about his sedation. I have cut back Greg Restoril a little bit to 15 mg at night. We got Greg  urinalysis which was completely normal. Supportive counseling to Greg Greg [name]. Mordecai Rasmussen, MD 04/21/2017, 2:41 PM

## 2017-04-22 LAB — GLUCOSE, CAPILLARY
Glucose-Capillary: 138 mg/dL — ABNORMAL HIGH (ref 65–99)
Glucose-Capillary: 115 mg/dL — ABNORMAL HIGH (ref 65–99)
Glucose-Capillary: 116 mg/dL — ABNORMAL HIGH (ref 65–99)
Glucose-Capillary: 112 mg/dL — ABNORMAL HIGH (ref 65–99)

## 2017-04-22 MED ORDER — TEMAZEPAM 15 MG PO CAPS
30.0000 mg | ORAL_CAPSULE | Freq: Every evening | ORAL | Status: DC | PRN
  Administered 2017-04-22 – 2017-04-23 (×2): 30 mg via ORAL
  Filled 2017-04-22 (×2): qty 2

## 2017-04-22 MED ORDER — PALIPERIDONE PALMITATE 234 MG/1.5ML IM SUSP
234.0000 mg | INTRAMUSCULAR | Status: DC
  Administered 2017-04-22: 234 mg via INTRAMUSCULAR
  Filled 2017-04-22 (×2): qty 1.5

## 2017-04-22 MED ORDER — PALIPERIDONE ER 3 MG PO TB24
6.0000 mg | ORAL_TABLET | Freq: Every day | ORAL | Status: DC
  Administered 2017-04-23 – 2017-04-26 (×4): 6 mg via ORAL
  Filled 2017-04-22 (×4): qty 2

## 2017-04-22 MED ORDER — CLOZAPINE 100 MG PO TABS
100.0000 mg | ORAL_TABLET | Freq: Every day | ORAL | Status: DC
  Administered 2017-04-22: 100 mg via ORAL
  Filled 2017-04-22: qty 1

## 2017-04-22 NOTE — Plan of Care (Signed)
Problem: Coping: Goal: Ability to cope will improve Outcome: Progressing Patient has been dressed in clean street clothes this evening and is observed talking to peers and staff in a calm manner.  He was pleasant on approach and engaged with Clinical research associate about medications.  Writer provided information on how Patient has been observed getting up and walking during the night, seeming to fall asleep standing up in his bathroom and having heavy snoring with periods of apnea when asleep.  He stated he has been told he "should be evaluated for sleep apnea" in the past.  He focused on wanting to be discharged tomorrow and is worried that if he "has a new problem, they want let be go home".  Writer provided some education on sleep apnea and the effects on ones mental health.  Patient admitted he often gets up during the night "and sometimes almost falls".  He agreed to ask for assistance during the night if needed.  Problem: Safety: Goal: Ability to remain free from injury will improve Outcome: Progressing Patient denies thoughts of self harm and contracts for safety.  He was observed walking with a steady gait most of the evening.  However, within 3 hours of taking his medications he was up walking in the halls and seemed disoriented.  He stated he was looking for the bathroom and was assisted back to his room twice.  He was given reassurance and encouraged to return to bed.

## 2017-04-22 NOTE — Tx Team (Signed)
Interdisciplinary Treatment and Diagnostic Plan Update  04/22/2017 Time of Session: 1102am Greg [name]. MRN: [id number]  Principal Diagnosis: Schizoaffective disorder, bipolar type (HCC)  Secondary Diagnoses: Principal Problem:   Schizoaffective disorder, bipolar type (HCC) Active Problems:   Essential hypertension   Obesity   Diabetes (HCC)   Tobacco use disorder   Current Medications:  Current Facility-Administered Medications  Medication Dose Route Frequency Provider Last Rate Last Dose  . acetaminophen (TYLENOL) tablet 650 mg  650 mg Oral Q6H PRN [name], [name], MD      . alum & mag hydroxide-simeth (MAALOX/MYLANTA) 200-200-20 MG/5ML suspension 30 mL  30 mL Oral Q4H PRN [name], [name], MD      . cloZAPine (CLOZARIL) tablet 100 mg  100 mg Oral QHS Pucilowska, Jolanta B, MD      . divalproex (DEPAKOTE) DR tablet 750 mg  750 mg Oral Q12H [name], [name], MD   750 mg at 04/22/17 0850  . hydrOXYzine (ATARAX/VISTARIL) tablet 50 mg  50 mg Oral TID PRN [name], [name], MD   50 mg at 04/22/17 1412  . insulin aspart (novoLOG) injection 0-15 Units  0-15 Units Subcutaneous TID WC Pucilowska, Jolanta B, MD   3 Units at 04/22/17 0851  . insulin aspart (novoLOG) injection 0-5 Units  0-5 Units Subcutaneous QHS Pucilowska, Jolanta B, MD      . insulin glargine (LANTUS) injection 10 Units  10 Units Subcutaneous Daily [name], [name], MD   10 Units at 04/21/17 2211  . lithium carbonate (ESKALITH) CR tablet 450 mg  450 mg Oral Q12H [name], [name], MD   450 mg at 04/22/17 0851  . magnesium hydroxide (MILK OF MAGNESIA) suspension 30 mL  30 mL Oral Daily PRN [name], [name], MD      . metFORMIN (GLUCOPHAGE) tablet 1,000 mg  1,000 mg Oral BID WC [name], [name], MD   1,000 mg at 04/22/17 0850  . metoprolol succinate (TOPROL-XL) 24 hr tablet 50 mg  50 mg Oral Daily [name], [name], MD   50 mg at 04/22/17 0850  . nicotine (NICODERM CQ - dosed in mg/24 hours) patch 21 mg  21 mg Transdermal Daily  Pucilowska, Jolanta B, MD   21 mg at 04/22/17 0851  . paliperidone (INVEGA SUSTENNA) injection 234 mg  234 mg Intramuscular Q28 days Pucilowska, Jolanta B, MD      . [name] ON 04/23/2017] paliperidone (INVEGA) 24 hr tablet 6 mg  6 mg Oral Q breakfast Pucilowska, Jolanta B, MD      . pneumococcal 23 valent vaccine (PNU-IMMUNE) injection 0.5 mL  0.5 mL Intramuscular Tomorrow-1000 Pucilowska, Jolanta B, MD      . temazepam (RESTORIL) capsule 30 mg  30 mg Oral QHS PRN Pucilowska, Jolanta B, MD       PTA Medications: Prescriptions Prior to Admission  Medication Sig Dispense Refill Last Dose  . amantadine (SYMMETREL) 100 MG capsule Take 100 mg by mouth 2 (two) times daily.     . benztropine (COGENTIN) 2 MG tablet Take 1 tablet by mouth daily.     . divalproex (DEPAKOTE) 250 MG DR tablet Take 3 tablets (750 mg total) by mouth every 12 (twelve) hours. 180 tablet 0 Taking  . insulin glargine (LANTUS) 100 UNIT/ML injection Inject 0.1 mLs (10 Units total) into the skin daily. 10 mL 0 Taking  . lithium carbonate (ESKALITH) 450 MG CR tablet Take 450 mg by mouth 2 (two) times daily.     [name] lithium  carbonate 300 MG capsule Take 300 mg by mouth 3 (three) times daily.     . metFORMIN (GLUCOPHAGE) 1000 MG tablet Take 1 tablet (1,000 mg total) by mouth 2 (two) times daily with a meal. 60 tablet 0 Taking  . metoprolol succinate (TOPROL-XL) 50 MG 24 hr tablet Take 1 tablet (50 mg total) by mouth daily. Take with or immediately following a meal. 30 tablet 0 Taking  . propranolol (INDERAL) 20 MG tablet Take 20 mg by mouth 2 (two) times daily.       Patient Stressors: Financial difficulties Health problems Medication change or noncompliance  Patient Strengths: Ability for insight Capable of independent living Supportive family/friends  Treatment Modalities: Medication Management, Group therapy, Case management,  1 to 1 session with clinician, Psychoeducation, Recreational therapy.   Physician Treatment Plan  for Primary Diagnosis: Schizoaffective disorder, bipolar type (HCC) Long Term Goal(s): Improvement in symptoms so as ready for discharge Improvement in symptoms so as ready for discharge   Short Term Goals: Ability to identify changes in lifestyle to reduce recurrence of condition will improve Ability to verbalize feelings will improve Ability to disclose and discuss suicidal ideas Ability to demonstrate self-control will improve Ability to identify and develop effective coping behaviors will improve Compliance with prescribed medications will improve Ability to identify triggers associated with substance abuse/mental health issues will improve Compliance with prescribed medications will improve  Medication Management: Evaluate patient's response, side effects, and tolerance of medication regimen.  Therapeutic Interventions: 1 to 1 sessions, Unit Group sessions and Medication administration.  Evaluation of Outcomes: Progressing  Physician Treatment Plan for Secondary Diagnosis: Principal Problem:   Schizoaffective disorder, bipolar type (HCC) Active Problems:   Essential hypertension   Obesity   Diabetes (HCC)   Tobacco use disorder  Long Term Goal(s): Improvement in symptoms so as ready for discharge Improvement in symptoms so as ready for discharge   Short Term Goals: Ability to identify changes in lifestyle to reduce recurrence of condition will improve Ability to verbalize feelings will improve Ability to disclose and discuss suicidal ideas Ability to demonstrate self-control will improve Ability to identify and develop effective coping behaviors will improve Compliance with prescribed medications will improve Ability to identify triggers associated with substance abuse/mental health issues will improve Compliance with prescribed medications will improve     Medication Management: Evaluate patient's response, side effects, and tolerance of medication regimen.  Therapeutic  Interventions: 1 to 1 sessions, Unit Group sessions and Medication administration.  Evaluation of Outcomes: Progressing   RN Treatment Plan for Primary Diagnosis: Schizoaffective disorder, bipolar type (HCC) Long Term Goal(s): Knowledge of disease and therapeutic regimen to maintain health will improve  Short Term Goals: Ability to verbalize frustration and anger appropriately will improve, Ability to participate in decision making will improve and Ability to identify and develop effective coping behaviors will improve  Medication Management: RN will administer medications as ordered by provider, will assess and evaluate patient's response and provide education to patient for prescribed medication. RN will report any adverse and/or side effects to prescribing provider.  Therapeutic Interventions: 1 on 1 counseling sessions, Psychoeducation, Medication administration, Evaluate responses to treatment, Monitor vital signs and CBGs as ordered, Perform/monitor CIWA, COWS, AIMS and Fall Risk screenings as ordered, Perform wound care treatments as ordered.  Evaluation of Outcomes: Progressing   LCSW Treatment Plan for Primary Diagnosis: Schizoaffective disorder, bipolar type (HCC) Long Term Goal(s): Safe transition to appropriate next level of care at discharge, Engage patient in therapeutic group addressing  interpersonal concerns.  Short Term Goals: Engage patient in aftercare planning with referrals and resources, Increase ability to appropriately verbalize feelings, Increase emotional regulation and Identify triggers associated with mental health/substance abuse issues  Therapeutic Interventions: Assess for all discharge needs, 1 to 1 time with Social worker, Explore available resources and support systems, Assess for adequacy in community support network, Educate family and significant other(s) on suicide prevention, Complete Psychosocial Assessment, Interpersonal group therapy.  Evaluation of  Outcomes: Progressing   Progress in Treatment: Attending groups: Yes. Participating in groups: Yes. Taking medication as prescribed: Yes. Toleration medication: Yes. Family/Significant other contact made: No, will contact:    Patient understands diagnosis: Yes. Discussing patient identified problems/goals with staff: Yes. Medical problems stabilized or resolved: Yes. Denies suicidal/homicidal ideation: Yes. Issues/concerns per patient self-inventory: No. Other:    New problem(s) identified: No, Describe:     New Short Term/Long Term Goal(s):  Discharge Plan or Barriers: CSW assessing for appropriate plan.  Reason for Continuation of Hospitalization: Mania Medication stabilization  Estimated Length of Stay:5-7 days  Attendees: Patient: 04/22/2017   Physician: Dr. Jennet Maduro, MD 04/22/2017   Nursing: Donnetta Simpers, RN 04/22/2017   RN Care Manager: 04/22/2017   Social Worker: Daleen Squibb, LCSW 04/22/2017   Recreational Therapist:  04/22/2017   Other:  04/22/2017   Other:  04/22/2017   Other: 04/22/2017        Scribe for Treatment Team: [name], LCSW 04/22/2017 2:40 PM

## 2017-04-22 NOTE — [facility] Group Notes (Signed)
[facility] Group Notes:  (Nursing/MHT/Case Management/Adjunct)  Date:  04/22/2017  Time:  10:15 AM  Type of Therapy:  Psychoeducational Skills  Participation Level:  Did Not Attend  Lynelle Smoke [facility] 04/22/2017, 10:15 AM

## 2017-04-22 NOTE — [facility] Group Notes (Signed)
[facility] LCSW Group Therapy Note  Date/Time: 04/22/17, 1300  Type of Therapy and Topic:  Group Therapy:  Overcoming Obstacles  Participation Level:  Did not attend  Description of Group:    In this group patients will be encouraged to explore what they see as obstacles to their own wellness and recovery. They will be guided to discuss their thoughts, feelings, and behaviors related to these obstacles. The group will process together ways to cope with barriers, with attention given to specific choices patients can make. Each patient will be challenged to identify changes they are motivated to make in order to overcome their obstacles. This group will be process-oriented, with patients participating in exploration of their own experiences as well as giving and receiving support and challenge from other group members.  Therapeutic Goals: 1. Patient will identify personal and current obstacles as they relate to admission. 2. Patient will identify barriers that currently interfere with their wellness or overcoming obstacles.  3. Patient will identify feelings, thought process and behaviors related to these barriers. 4. Patient will identify two changes they are willing to make to overcome these obstacles:    Summary of Patient Progress      Therapeutic Modalities:   Cognitive Behavioral Therapy Solution Focused Therapy Motivational Interviewing Relapse Prevention Therapy  Greg Maiyah Goyne, LCSW 

## 2017-04-22 NOTE — Progress Notes (Signed)
[facility] MD Progress Note  04/22/2017 1:13 PM Greg [name].  MRN:  [id number]  Subjective:    History of present illness. Information was obtained from the patient, the chart, and his mother on the phone. The patient was completed by his mother for escalating symptoms of mania with psychosis. The patient became threatening to his elderly parents, refused to take some of his medications, has not been able to sleep, and has been increasingly agitated. He also states started bizarre behavior with consuming large volumes of salt. He has also been neglectful his medical problems. He stopped taking his insulin and blood pressure medication believing that God cured him. The mother reports that the patient has been only partially compliant with medications, picking and choosing what to take. On admission his Lithium level was therapeutic while Depakote level was low. The patient has been taking oral Invega but he is very skilful to cheek his medication. He was on [place] in the past with good results. It is unclear why Invega injections were stopped. According to the mother, all summer long, the patient has been standing in the throat house naked with a necklace made O pieces of concrete, greeting passersby cars, and protesting against abortion. He lately been rather delusional believing that he is God. He grew his hear over the summer but recently buzz cut some of it.   04/18/2017. [name] continues to be intrusive, irritable, loud and easily agitated. He took his medications. Hinda Glatter sustenna was not given as it has not arrived from the pharmacy. We had a discussion about starting Clozapine. The patient has never taken it but "heard about it". I had a long conversation with his mother who is in support. The patient has not been well all summer long  But only recently became threatening to his elderly mother. He has no somatic compalints. Slept better, appetite is good. He isolates in his room but is  approachable and even funny but goofy.  04/19/2017. Mr. Chestnut seem slightly better. He is still restless and irritable but able to engage in conversation with me and with one of his peers. Somehow, Invega sustenna injection ordered on 9/19 has not been given. I renewed the order. The patient is compliant with medications and tolerates them well. There are no somatic complaints. He slept 6 hours with Restoril. We again discussed Clozapine addition. Everybody seems to like the idea but his poor compliance might be a problem. I will order CBC and register the patient.  Follow-up for the 22nd on Saturday. Patient was pleasant in interaction today. He has not been aggressive loud or very dramatic today so far. Patient has no complaints about the clozapine. Slept more adequately last night. Denies suicidal ideation. Still smiles an awful lot and seems a little odd in his thinking but not intrusively bizarre  Follow-up for today Sunday the 23rd. Patient has no complaints. He says he feels like he is doing fine. He interacts with some other patients appropriately. Has not been aggressive or threatening and is able to hold conversations without getting into his psychotic delusions. Appears to be compliant with medicine. Nurses report that in the morning he is extremely sedated and think perhaps he needs less sedating medicine at night. The nurses also thought that his urine had a foul smell and that he needed a urine analysis. Patient seemed puzzled by that saying he had no problems with his urine. The urinalysis in fact is completely normal.  04/22/2017. Mr. [name] did not  sleep well last night as recorded by nursing staff. He feeks that he slept very well. Over the weekend, he appeared sedated and his medications were held or lowered. He himself, has no complaints and feels better. No racing thoughts. Reports no somatic complaints. He is already drooling from 75 mg of Clozapine. He still did not receine invega  sustenna injection. Spoke with the pharamacist.  Per nursing: Patient had a restless night and was up 4 times, dressed, and walking in the hall.  When approached, he talked about looking for his bathroom and appeared to think it was time for breakfast or vital signs.  He also focused on wanting to be discharged and conveyed that he is worried about "messing up discharge".  Each time he returned to his room he was observed sleeping within minutes.  Principal Problem: Schizoaffective disorder, bipolar type (HCC) Diagnosis:   Patient Active Problem List   Diagnosis Date Noted  . Schizoaffective disorder, bipolar type (HCC) [F25.0] 03/28/2016  . Acute cholecystitis with chronic cholecystitis [K81.2] 03/27/2016  . Anxiety [F41.9]   . 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 Bipolar 2 disorder (HCC) [F31.81]   . Hyponatremia [E87.1] 10/06/2015  . Essential hypertension [I10] 11/28/2014  . Dyslipidemia [E78.5] 11/28/2014  . Obesity [E66.9] 11/28/2014  . Diabetes (HCC) [E11.9] 11/28/2014  . Tobacco use disorder [F17.200] 11/28/2014   Total Time spent with patient: 30 minutes  Past Psychiatric History: long history of bipolar manic episodes, multiple hospitalizations and medication trials, poor treatment compliance.  Past Medical History:  Past Medical History:  Diagnosis Date  . Bipolar 1 disorder (HCC)   . Diabetes (HCC)   . Dyslipidemia   . HTN (hypertension)   . Obesity   . Paranoia (HCC)   . Schizo affective schizophrenia [facility])     Past Surgical History:  Procedure Laterality Date  . APPENDECTOMY  51 years old  . CHOLECYSTECTOMY N/A 03/30/2016   Procedure: LAPAROSCOPIC CHOLECYSTECTOMY;  Surgeon: Gladis Riffle, MD;  Location: [facility] ORS;  Service: General;  Laterality: N/A;  . TONSILLECTOMY  51 year old   Family History:  Family History  Problem Relation Age of Onset  . Depression Mother   . Mental illness Mother   . Depression Father   . Mental illness Father   .  Depression Sister   . Mental illness Sister    Family Psychiatric  History: grandmother with schizophrenia. Social History:  History  Alcohol Use No     History  Drug Use No    Social History   Social History  . Marital status: Single    Spouse name: N/A  . Number of children: N/A  . Years of education: N/A   Social History Main Topics  . Smoking status: Current Every Day Smoker    Packs/day: 0.50    Years: 8.00    Types: Cigarettes  . Smokeless tobacco: Current User    Types: Snuff  . Alcohol use No  . Drug use: No  . Sexual activity: No   Other Topics Concern  . None   Social History Narrative  . None   Additional Social History: the patient is disabled from mental illness. He lives with his elderly parents who no longer are able to care for him.                        Sleep: Fair  Appetite:  Fair  Current Medications: Current Facility-Administered Medications  Medication Dose Route Frequency  Provider Last Rate Last Dose  . acetaminophen (TYLENOL) tablet 650 mg  650 mg Oral Q6H PRN [name], [name], MD      . alum & mag hydroxide-simeth (MAALOX/MYLANTA) 200-200-20 MG/5ML suspension 30 mL  30 mL Oral Q4H PRN [name], [name], MD      . cloZAPine (CLOZARIL) tablet 75 mg  75 mg Oral QHS [name], [name], MD   75 mg at 04/21/17 2119  . divalproex (DEPAKOTE) DR tablet 750 mg  750 mg Oral Q12H [name], [name], MD   750 mg at 04/22/17 0850  . hydrOXYzine (ATARAX/VISTARIL) tablet 50 mg  50 mg Oral TID PRN [name], [name], MD   50 mg at 04/17/17 0039  . insulin aspart (novoLOG) injection 0-15 Units  0-15 Units Subcutaneous TID WC Shawanna Zanders B, MD   3 Units at 04/22/17 0851  . insulin aspart (novoLOG) injection 0-5 Units  0-5 Units Subcutaneous QHS Rufus Beske B, MD      . insulin glargine (LANTUS) injection 10 Units  10 Units Subcutaneous Daily [name], [name], MD   10 Units at 04/21/17 2211  . lithium carbonate (ESKALITH) CR tablet 450 mg  450  mg Oral Q12H [name], [name], MD   450 mg at 04/22/17 0851  . magnesium hydroxide (MILK OF MAGNESIA) suspension 30 mL  30 mL Oral Daily PRN [name], [name], MD      . metFORMIN (GLUCOPHAGE) tablet 1,000 mg  1,000 mg Oral BID WC [name], [name], MD   1,000 mg at 04/22/17 0850  . metoprolol succinate (TOPROL-XL) 24 hr tablet 50 mg  50 mg Oral Daily [name], [name], MD   50 mg at 04/22/17 0850  . nicotine (NICODERM CQ - dosed in mg/24 hours) patch 21 mg  21 mg Transdermal Daily Annely Sliva B, MD   21 mg at 04/22/17 0851  . [START ON 05/17/2017] paliperidone (INVEGA SUSTENNA) injection 234 mg  234 mg Intramuscular Q28 days Sonyia Muro B, MD      . paliperidone (INVEGA) 24 hr tablet 6 mg  6 mg Oral QHS [name], [name], MD   6 mg at 04/21/17 2118  . pneumococcal 23 valent vaccine (PNU-IMMUNE) injection 0.5 mL  0.5 mL Intramuscular Tomorrow-1000 Stokely Jeancharles B, MD      . temazepam (RESTORIL) capsule 15 mg  15 mg Oral QHS [name], [name], MD   15 mg at 04/21/17 2207    Lab Results:  Results for orders placed or performed during the hospital encounter of 04/16/17 (from the past 48 hour(s))  Glucose, capillary     Status: Abnormal   Collection Time: 04/20/17  4:20 PM  Result Value Ref Range   Glucose-Capillary 107 (H) 65 - 99 mg/dL  Glucose, capillary     Status: None   Collection Time: 04/20/17  8:21 PM  Result Value Ref Range   Glucose-Capillary 97 65 - 99 mg/dL  Glucose, capillary     Status: Abnormal   Collection Time: 04/21/17  7:05 AM  Result Value Ref Range   Glucose-Capillary 141 (H) 65 - 99 mg/dL  Glucose, capillary     Status: Abnormal   Collection Time: 04/21/17 11:46 AM  Result Value Ref Range   Glucose-Capillary 120 (H) 65 - 99 mg/dL  Urinalysis, Complete w Microscopic     Status: Abnormal   Collection Time: 04/21/17  2:04 PM  Result Value Ref Range   Color, Urine YELLOW (A) YELLOW   APPearance CLEAR (A) CLEAR  Specific Gravity, Urine 1.024 1.005 - 1.030    pH 6.0 5.0 - 8.0   Glucose, UA NEGATIVE NEGATIVE mg/dL   Hgb urine dipstick NEGATIVE NEGATIVE   Bilirubin Urine NEGATIVE NEGATIVE   Ketones, ur 5 (A) NEGATIVE mg/dL   Protein, ur 30 (A) NEGATIVE mg/dL   Nitrite NEGATIVE NEGATIVE   Leukocytes, UA NEGATIVE NEGATIVE   RBC / HPF NONE SEEN 0 - 5 RBC/hpf   WBC, UA 0-5 0 - 5 WBC/hpf   Bacteria, UA NONE SEEN NONE SEEN   Squamous Epithelial / LPF NONE SEEN NONE SEEN   Mucus PRESENT   Glucose, capillary     Status: Abnormal   Collection Time: 04/21/17  4:27 PM  Result Value Ref Range   Glucose-Capillary 133 (H) 65 - 99 mg/dL   Comment 1 Notify RN   Glucose, capillary     Status: Abnormal   Collection Time: 04/21/17  8:09 PM  Result Value Ref Range   Glucose-Capillary 102 (H) 65 - 99 mg/dL  Glucose, capillary     Status: Abnormal   Collection Time: 04/22/17  6:50 AM  Result Value Ref Range   Glucose-Capillary 138 (H) 65 - 99 mg/dL  Glucose, capillary     Status: Abnormal   Collection Time: 04/22/17 11:32 AM  Result Value Ref Range   Glucose-Capillary 115 (H) 65 - 99 mg/dL   Comment 1 Notify RN     Blood Alcohol level:  Lab Results  Component Value Date   ETH <5 04/14/2017   ETH <5 10/06/2015    Metabolic Disorder Labs: Lab Results  Component Value Date   HGBA1C 10.9 (H) 04/17/2017   MPG 266.13 04/17/2017   MPG 154 02/09/2015   Lab Results  Component Value Date   PROLACTIN 43.3 (H) 04/17/2017   PROLACTIN 45.2 (H) 10/07/2015   Lab Results  Component Value Date   CHOL 103 04/17/2017   TRIG 110 04/17/2017   HDL 34 (L) 04/17/2017   CHOLHDL 3.0 04/17/2017   VLDL 22 04/17/2017   LDLCALC 47 04/17/2017   LDLCALC 44 10/07/2015    Physical Findings: AIMS:  , ,  ,  ,    CIWA:    COWS:     Musculoskeletal: Strength & Muscle Tone: within normal limits Gait & Station: normal Patient leans: N/A  Psychiatric Specialty Exam: Physical Exam  Nursing note and vitals reviewed. Psychiatric: His speech is normal. His  affect is blunt. He is slowed and withdrawn. Thought content is delusional. Thought content is not paranoid. Cognition and memory are normal. He expresses impulsivity.    Review of Systems  Constitutional: Negative.   HENT: Negative.   Eyes: Negative.   Respiratory: Negative.   Cardiovascular: Negative.   Gastrointestinal: Negative.   Genitourinary: Negative.   Musculoskeletal: Negative.   Skin: Negative.   Neurological: Negative.   Endo/Heme/Allergies: Negative.   Psychiatric/Behavioral: The patient has insomnia.     Blood pressure 115/77, pulse 98, temperature 97.8 F (36.6 C), temperature source Oral, resp. rate 16, height  (1.753 m), weight 111.6 kg (246 lb), SpO2 97 %.Body mass index is 36.33 kg/m.  General Appearance: Disheveled  Eye Contact:  Good  Speech:  Slow  Volume:  Decreased  Mood:  Depressed  Affect:  Inappropriate  Thought Process:  Disorganized and Descriptions of Associations: Loose  Orientation:  Full (Time, Place, and Person)  Thought Content:  Delusions and Paranoid Ideation  Suicidal Thoughts:  No  Homicidal Thoughts:  No  Memory:  Immediate;  Fair Recent;   Fair Remote;   Fair  Judgement:  Poor  Insight:  Lacking  Psychomotor Activity:  Increased  Concentration:  Concentration: Poor and Attention Span: Poor  Recall:  Poor  Fund of Knowledge:  Fair  Language:  Fair  Akathisia:  No  Handed:  Right  AIMS (if indicated):     Assets:  Communication Skills Desire for Improvement Financial Resources/Insurance Housing Leisure Time Resilience Social Support  ADL's:  Intact  Cognition:  WNL  Sleep:  Number of Hours: 5     Treatment Plan Summary: Daily contact with patient to assess and evaluate symptoms and progress in treatment and Medication management   Mr. Gebhard is a 51 year old male with history of schizoaffective disorder admitted floridly manic in spite of reasonable medication compliance.  1. Mood and psychosis. He has been  maintained on a combination of oral Invega, lithium, and Depakote. We continue all his medications. The patient agreed to [place] injection. We also started Clozapine, currently 75 mg nightly.  We will increase it tonight to 100 mg.  2. Insomnia. Did not sleep well in spite of Restoril and Clozapine. We will give Restoril as prn.   3. Hypertension. He is on metoprolol.   4. Diabetes. He is on ADA diet, Lantus and metformin, blood glucose monitoring, SSI. Input from diabetes nurse coordinator is appreciated. Living well with diabetes book ordered.  5. Smoking. Nicotine patch is available.  6. Sleep apnea. He is on CPAP.  7. Metabolic syndrome monitoring. Lipid panel and TSH are normal, hemoglobin A1c is 10.9.   8. EKG. Normal sinus rhythm, QTc 437.   9. Social. He lives with her elderly parents who no longer feel they can provide care. They will need advice on guardianship process and placement.  10. Disposition. To be established. He will likely be discharged back to his parents. He will follow up with [facility].    Kristine Linea, MD 04/22/2017, 1:13 PM

## 2017-04-22 NOTE — Progress Notes (Signed)
Patient had a restless night and was up 4 times, dressed, and walking in the hall.  When approached, he talked about looking for his bathroom and appeared to think it was time for breakfast or vital signs.  He also focused on wanting to be discharged and conveyed that he is worried about "messing up discharge".  Each time he returned to his room he was observed sleeping within minutes.

## 2017-04-22 NOTE — Plan of Care (Signed)
Problem: Coping: Goal: Ability to demonstrate self-control will improve Outcome: Progressing Patient is med compliant  Problem: Health Behavior/Discharge Planning: Goal: Compliance with prescribed medication regimen will improve Outcome: Progressing Patient is med compliant

## 2017-04-23 LAB — GLUCOSE, CAPILLARY
Glucose-Capillary: 120 mg/dL — ABNORMAL HIGH (ref 65–99)
Glucose-Capillary: 131 mg/dL — ABNORMAL HIGH (ref 65–99)
Glucose-Capillary: 110 mg/dL — ABNORMAL HIGH (ref 65–99)

## 2017-04-23 MED ORDER — CLOZAPINE 100 MG PO TABS
200.0000 mg | ORAL_TABLET | Freq: Every day | ORAL | Status: DC
  Administered 2017-04-23: 200 mg via ORAL
  Filled 2017-04-23: qty 2

## 2017-04-23 NOTE — Progress Notes (Signed)
[facility] MD Progress Note  04/23/2017 3:54 PM Mack Elsie Ra.  MRN:  [id number]  Subjective:    History of present illness. Information was obtained from the patient, the chart, and his mother on the phone. The patient was completed by his mother for escalating symptoms of mania with psychosis. The patient became threatening to his elderly parents, refused to take some of his medications, has not been able to sleep, and has been increasingly agitated. He also states started bizarre behavior with consuming large volumes of salt. He has also been neglectful his medical problems. He stopped taking his insulin and blood pressure medication believing that God cured him. The mother reports that the patient has been only partially compliant with medications, picking and choosing what to take. On admission his Lithium level was therapeutic while Depakote level was low. The patient has been taking oral Invega but he is very skilful to cheek his medication. He was on [place] in the past with good results. It is unclear why Invega injections were stopped. According to the mother, all summer long, the patient has been standing in the throat house naked with a necklace made O pieces of concrete, greeting passersby cars, and protesting against abortion. He lately been rather delusional believing that he is God. He grew his hear over the summer but recently buzz cut some of it.   04/18/2017. Mr. Harbeson continues to be intrusive, irritable, loud and easily agitated. He took his medications. Hinda Glatter sustenna was not given as it has not arrived from the pharmacy. We had a discussion about starting Clozapine. The patient has never taken it but "heard about it". I had a long conversation with his mother who is in support. The patient has not been well all summer long  But only recently became threatening to his elderly mother. He has no somatic compalints. Slept better, appetite is good. He isolates in his room but is  approachable and even funny but goofy.  04/19/2017. Mr. Cogburn seem slightly better. He is still restless and irritable but able to engage in conversation with me and with one of his peers. Somehow, Invega sustenna injection ordered on 9/19 has not been given. I renewed the order. The patient is compliant with medications and tolerates them well. There are no somatic complaints. He slept 6 hours with Restoril. We again discussed Clozapine addition. Everybody seems to like the idea but his poor compliance might be a problem. I will order CBC and register the patient.  Follow-up for the 22nd on Saturday. Patient was pleasant in interaction today. He has not been aggressive loud or very dramatic today so far. Patient has no complaints about the clozapine. Slept more adequately last night. Denies suicidal ideation. Still smiles an awful lot and seems a little odd in his thinking but not intrusively bizarre  Follow-up for today Sunday the 23rd. Patient has no complaints. He says he feels like he is doing fine. He interacts with some other patients appropriately. Has not been aggressive or threatening and is able to hold conversations without getting into his psychotic delusions. Appears to be compliant with medicine. Nurses report that in the morning he is extremely sedated and think perhaps he needs less sedating medicine at night. The nurses also thought that his urine had a foul smell and that he needed a urine analysis. Patient seemed puzzled by that saying he had no problems with his urine. The urinalysis in fact is completely normal.  04/22/2017. Mr. [name] did not  sleep well last night as recorded by nursing staff. He feeks that he slept very well. Over the weekend, he appeared sedated and his medications were held or lowered. He himself, has no complaints and feels better. No racing thoughts. Reports no somatic complaints. He is already drooling from 75 mg of Clozapine. He still did not receine invega  sustenna injection. Spoke with the pharamacist.  04/23/2017. Mr. Arment is rather irritable and somewhat confused about his treatment. He seems not to understand the clozapine titration is a slow process will take a few days. His discharge is not expected until next week. After long conversation he seems to accept it. He has absolutely no complaints and feels he is ready for discharge. We will increase clozapine to 200 today.    Per nursing: Patient is alert and oriented to person, place and time. Skin is warm, dry and intact. No limitations to all four extremities noted. Patient denies SI at this time. Patient was observed ambulating in hall during the shift with a slow, steady gait. Attends meals with selective  peer interaction noted. Patient encouraged to attend group session but declined. Milieu remains therapeutic. Patient will be monitored and physician notified of any acute changes.  Principal Problem: Schizoaffective disorder, bipolar type (HCC) Diagnosis:   Patient Active Problem List   Diagnosis Date Noted  . Schizoaffective disorder, bipolar type (HCC) [F25.0] 03/28/2016  . Acute cholecystitis with chronic cholecystitis [K81.2] 03/27/2016  . Anxiety [F41.9]   . 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 Bipolar 2 disorder (HCC) [F31.81]   . Hyponatremia [E87.1] 10/06/2015  . Essential hypertension [I10] 11/28/2014  . Dyslipidemia [E78.5] 11/28/2014  . Obesity [E66.9] 11/28/2014  . Diabetes (HCC) [E11.9] 11/28/2014  . Tobacco use disorder [F17.200] 11/28/2014   Total Time spent with patient: 30 minutes  Past Psychiatric History: long history of bipolar manic episodes, multiple hospitalizations and medication trials, poor treatment compliance.  Past Medical History:  Past Medical History:  Diagnosis Date  . Bipolar 1 disorder (HCC)   . Diabetes (HCC)   . Dyslipidemia   . HTN (hypertension)   . Obesity   . Paranoia (HCC)   . Schizo affective schizophrenia [facility])      Past Surgical History:  Procedure Laterality Date  . APPENDECTOMY  51 years old  . CHOLECYSTECTOMY N/A 03/30/2016   Procedure: LAPAROSCOPIC CHOLECYSTECTOMY;  Surgeon: Gladis Riffle, MD;  Location: [facility] ORS;  Service: General;  Laterality: N/A;  . TONSILLECTOMY  51 year old   Family History:  Family History  Problem Relation Age of Onset  . Depression Mother   . Mental illness Mother   . Depression Father   . Mental illness Father   . Depression Sister   . Mental illness Sister    Family Psychiatric  History: grandmother with schizophrenia. Social History:  History  Alcohol Use No     History  Drug Use No    Social History   Social History  . Marital status: Single    Spouse name: N/A  . Number of children: N/A  . Years of education: N/A   Social History Main Topics  . Smoking status: Current Every Day Smoker    Packs/day: 0.50    Years: 8.00    Types: Cigarettes  . Smokeless tobacco: Current User    Types: Snuff  . Alcohol use No  . Drug use: No  . Sexual activity: No   Other Topics Concern  . None   Social History Narrative  .  None   Additional Social History: the patient is disabled from mental illness. He lives with his elderly parents who no longer are able to care for him.                        Sleep: Fair  Appetite:  Fair  Current Medications: Current Facility-Administered Medications  Medication Dose Route Frequency Provider Last Rate Last Dose  . acetaminophen (TYLENOL) tablet 650 mg  650 mg Oral Q6H PRN [name], [name], MD      . alum & mag hydroxide-simeth (MAALOX/MYLANTA) 200-200-20 MG/5ML suspension 30 mL  30 mL Oral Q4H PRN [name], [name], MD      . cloZAPine (CLOZARIL) tablet 100 mg  100 mg Oral QHS Pucilowska, Jolanta B, MD   100 mg at 04/22/17 2142  . divalproex (DEPAKOTE) DR tablet 750 mg  750 mg Oral Q12H [name], [name], MD   750 mg at 04/23/17 0755  . hydrOXYzine (ATARAX/VISTARIL) tablet 50 mg  50 mg Oral TID PRN  [name], [name], MD   50 mg at 04/22/17 1412  . insulin aspart (novoLOG) injection 0-15 Units  0-15 Units Subcutaneous TID WC Pucilowska, Jolanta B, MD   2 Units at 04/23/17 1141  . insulin aspart (novoLOG) injection 0-5 Units  0-5 Units Subcutaneous QHS Pucilowska, Jolanta B, MD      . insulin glargine (LANTUS) injection 10 Units  10 Units Subcutaneous Daily [name], [name], MD   10 Units at 04/22/17 2140  . lithium carbonate (ESKALITH) CR tablet 450 mg  450 mg Oral Q12H [name], [name], MD   450 mg at 04/23/17 0755  . magnesium hydroxide (MILK OF MAGNESIA) suspension 30 mL  30 mL Oral Daily PRN [name], [name], MD      . metFORMIN (GLUCOPHAGE) tablet 1,000 mg  1,000 mg Oral BID WC [name], [name], MD   1,000 mg at 04/23/17 0754  . metoprolol succinate (TOPROL-XL) 24 hr tablet 50 mg  50 mg Oral Daily [name], [name], MD   50 mg at 04/23/17 0755  . nicotine (NICODERM CQ - dosed in mg/24 hours) patch 21 mg  21 mg Transdermal Daily Pucilowska, Jolanta B, MD   21 mg at 04/23/17 0755  . paliperidone (INVEGA SUSTENNA) injection 234 mg  234 mg Intramuscular Q28 days Pucilowska, Jolanta B, MD   234 mg at 04/22/17 1701  . paliperidone (INVEGA) 24 hr tablet 6 mg  6 mg Oral Q breakfast Pucilowska, Jolanta B, MD   6 mg at 04/23/17 0755  . pneumococcal 23 valent vaccine (PNU-IMMUNE) injection 0.5 mL  0.5 mL Intramuscular Tomorrow-1000 Pucilowska, Jolanta B, MD      . temazepam (RESTORIL) capsule 30 mg  30 mg Oral QHS PRN Pucilowska, Jolanta B, MD   30 mg at 04/22/17 2143    Lab Results:  Results for orders placed or performed during the hospital encounter of 04/16/17 (from the past 48 hour(s))  Glucose, capillary     Status: Abnormal   Collection Time: 04/21/17  4:27 PM  Result Value Ref Range   Glucose-Capillary 133 (H) 65 - 99 mg/dL   Comment 1 Notify RN   Glucose, capillary     Status: Abnormal   Collection Time: 04/21/17  8:09 PM  Result Value Ref Range   Glucose-Capillary 102 (H) 65 - 99 mg/dL   Glucose, capillary     Status: Abnormal   Collection Time: 04/22/17  6:50 AM  Result  Value Ref Range   Glucose-Capillary 138 (H) 65 - 99 mg/dL  Glucose, capillary     Status: Abnormal   Collection Time: 04/22/17 11:32 AM  Result Value Ref Range   Glucose-Capillary 115 (H) 65 - 99 mg/dL   Comment 1 Notify RN   Glucose, capillary     Status: Abnormal   Collection Time: 04/22/17  4:07 PM  Result Value Ref Range   Glucose-Capillary 116 (H) 65 - 99 mg/dL   Comment 1 Notify RN   Glucose, capillary     Status: Abnormal   Collection Time: 04/22/17  8:40 PM  Result Value Ref Range   Glucose-Capillary 112 (H) 65 - 99 mg/dL  Glucose, capillary     Status: Abnormal   Collection Time: 04/23/17  7:52 AM  Result Value Ref Range   Glucose-Capillary 120 (H) 65 - 99 mg/dL  Glucose, capillary     Status: Abnormal   Collection Time: 04/23/17 11:37 AM  Result Value Ref Range   Glucose-Capillary 131 (H) 65 - 99 mg/dL   Comment 1 Notify RN     Blood Alcohol level:  Lab Results  Component Value Date   ETH <5 04/14/2017   ETH <5 10/06/2015    Metabolic Disorder Labs: Lab Results  Component Value Date   HGBA1C 10.9 (H) 04/17/2017   MPG 266.13 04/17/2017   MPG 154 02/09/2015   Lab Results  Component Value Date   PROLACTIN 43.3 (H) 04/17/2017   PROLACTIN 45.2 (H) 10/07/2015   Lab Results  Component Value Date   CHOL 103 04/17/2017   TRIG 110 04/17/2017   HDL 34 (L) 04/17/2017   CHOLHDL 3.0 04/17/2017   VLDL 22 04/17/2017   LDLCALC 47 04/17/2017   LDLCALC 44 10/07/2015    Physical Findings: AIMS:  , ,  ,  ,    CIWA:    COWS:     Musculoskeletal: Strength & Muscle Tone: within normal limits Gait & Station: normal Patient leans: N/A  Psychiatric Specialty Exam: Physical Exam  Nursing note and vitals reviewed. Psychiatric: His speech is normal. His affect is blunt. He is slowed and withdrawn. Thought content is delusional. Thought content is not paranoid. Cognition and  memory are normal. He expresses impulsivity.    Review of Systems  Constitutional: Negative.   HENT: Negative.   Eyes: Negative.   Respiratory: Negative.   Cardiovascular: Negative.   Gastrointestinal: Negative.   Genitourinary: Negative.   Musculoskeletal: Negative.   Skin: Negative.   Neurological: Negative.   Endo/Heme/Allergies: Negative.   Psychiatric/Behavioral: The patient has insomnia.     Blood pressure 121/65, pulse 97, temperature 98 F (36.7 C), temperature source Oral, resp. rate 17, height  (1.753 m), weight 111.6 kg (246 lb), SpO2 97 %.Body mass index is 36.33 kg/m.  General Appearance: Disheveled  Eye Contact:  Good  Speech:  Slow  Volume:  Decreased  Mood:  Depressed  Affect:  Inappropriate  Thought Process:  Disorganized and Descriptions of Associations: Loose  Orientation:  Full (Time, Place, and Person)  Thought Content:  Delusions and Paranoid Ideation  Suicidal Thoughts:  No  Homicidal Thoughts:  No  Memory:  Immediate;   Fair Recent;   Fair Remote;   Fair  Judgement:  Poor  Insight:  Lacking  Psychomotor Activity:  Increased  Concentration:  Concentration: Poor and Attention Span: Poor  Recall:  Poor  Fund of Knowledge:  Fair  Language:  Fair  Akathisia:  No  Handed:  Right  AIMS (if indicated):     Assets:  Communication Skills Desire for Improvement Financial Resources/Insurance Housing Leisure Time Resilience Social Support  ADL's:  Intact  Cognition:  WNL  Sleep:  Number of Hours: 7     Treatment Plan Summary: Daily contact with patient to assess and evaluate symptoms and progress in treatment and Medication management   Mr. Enyeart is a 51 year old male with history of schizoaffective disorder admitted floridly manic in spite of reasonable medication compliance.  1. Mood and psychosis. He has been maintained on a combination of oral Invega, lithium, and Depakote. We continue all his medications. The patient agreed to  [place] injection. It was given on 04/22/2017. We also started Clozapine, currently 75 mg nightly.  We will further increase it tonight to 200 mg.  2. Insomnia. Did not sleep well in spite of Restoril and Clozapine. We will give Restoril as prn.   3. Hypertension. He is on metoprolol.   4. Diabetes. He is on ADA diet, Lantus and metformin, blood glucose monitoring, SSI. Input from diabetes nurse coordinator is appreciated. Living well with diabetes book ordered.  5. Smoking. Nicotine patch is available.  6. Sleep apnea. He is on CPAP.  7. Metabolic syndrome monitoring. Lipid panel and TSH are normal, hemoglobin A1c is 10.9.   8. EKG. Normal sinus rhythm, QTc 437.   9. Social. He lives with her elderly parents who no longer feel they can provide care. They will need advice on guardianship process and placement.  10. Disposition. To be established. He will likely be discharged back to his parents. He will follow up with [facility].    Kristine Linea, MD 04/23/2017, 3:54 PM

## 2017-04-23 NOTE — [facility] Group Notes (Signed)
LCSW Group Therapy Note  04/23/2017 3pm  Type of Therapy/Topic:  Group Therapy:  Feelings about Diagnosis  Participation Level:  Active   Description of Group:   This group will allow patients to explore their thoughts and feelings about diagnoses they have received. Patients will be guided to explore their level of understanding and acceptance of these diagnoses. Facilitator will encourage patients to process their thoughts and feelings about the reactions of others to their diagnosis and will guide patients in identifying ways to discuss their diagnosis with significant others in their lives. This group will be process-oriented, with patients participating in exploration of their own experiences, giving and receiving support, and processing challenge from other group members.   Therapeutic Goals: 1. Patient will demonstrate understanding of diagnosis as evidenced by identifying two or more symptoms of the disorder 2. Patient will be able to express two feelings regarding the diagnosis 3. Patient will demonstrate their ability to communicate their needs through discussion and/or role play  Summary of Patient Progress:   Pt able to meet therapeutic goals, verbalizes that he was relieved to know diagnosis and was able to learn more about it.  Supportive of other patients.    Therapeutic Modalities:   Cognitive Behavioral Therapy Brief Therapy Feelings Identification    Glennon Mac, LCSW 04/23/2017 5:25 PM

## 2017-04-23 NOTE — [facility] Group Notes (Signed)
[facility] Group Notes:  (Nursing/MHT/Case Management/Adjunct)  Date:  04/23/2017  Time:  3:16 PM  Type of Therapy:  Psychoeducational Skills  Participation Level:  Did Not Attend   Lynelle Smoke [facility] 04/23/2017, 3:16 PM

## 2017-04-23 NOTE — Plan of Care (Signed)
Problem: Activity: Goal: Will verbalize the importance of balancing activity with adequate rest periods Outcome: Progressing Patient has verbalized the need for rest. Patient seen resting for periods throughout the day.  Problem: Education: Goal: Knowledge of the prescribed therapeutic regimen will improve Outcome: Progressing Patient is aware of his therapeutic regimen and frequently asks questions  Problem: Health Behavior/Discharge Planning: Goal: Compliance with prescribed medication regimen will improve Outcome: Progressing Patient is compliant with medication regimen.  Problem: Nutritional: Goal: Ability to achieve adequate nutritional intake will improve Outcome: Progressing Patient is compliant with nutritional diet, AEB- regulated blood sugars.  Problem: Role Relationship: Goal: Ability to interact with others will improve Outcome: Progressing Patient engages appropriately with peers.

## 2017-04-23 NOTE — Progress Notes (Signed)
D:Pt denies SI/HI/AVH, affect is flat but brightens upon approach. Pt is pleasant and cooperative, his thoughts are less disorganized, speech is soft and tangential. Pt appears less anxious and he is interacting with peers and staff appropriately.  A:Pt was offered support and encouragement. Pt was given scheduled medications. Pt was encouraged to attend groups. Q 15 minute checks were done for safety.  R:Pt attends groups and interacts well with peers and staff. Pt is taking medication. Pt has no complaints.Pt receptive to treatment and safety maintained on unit.  

## 2017-04-23 NOTE — Plan of Care (Signed)
Problem: Coping: Goal: Ability to verbalize feelings will improve Outcome: Progressing Patient verbalized feelings to staff.    

## 2017-04-23 NOTE — Progress Notes (Signed)
Patient is alert and oriented to person, place and time. Skin is warm, dry and intact. No limitations to all four extremities noted. Patient denies SI at this time. Patient was observed ambulating in hall during the shift with a slow, steady gait. Attends meals with selective  peer interaction noted. Patient encouraged to attend group session but declined. Milieu remains therapeutic. Patient will be monitored and physician notified of any acute changes.

## 2017-04-24 LAB — GLUCOSE, CAPILLARY
Glucose-Capillary: 96 mg/dL (ref 65–99)
Glucose-Capillary: 123 mg/dL — ABNORMAL HIGH (ref 65–99)
Glucose-Capillary: 165 mg/dL — ABNORMAL HIGH (ref 65–99)
Glucose-Capillary: 121 mg/dL — ABNORMAL HIGH (ref 65–99)
Glucose-Capillary: 103 mg/dL — ABNORMAL HIGH (ref 65–99)

## 2017-04-24 MED ORDER — CLOZAPINE 100 MG PO TABS
100.0000 mg | ORAL_TABLET | Freq: Every day | ORAL | Status: DC
  Administered 2017-04-24: 100 mg via ORAL
  Filled 2017-04-24: qty 1

## 2017-04-24 MED ORDER — TEMAZEPAM 15 MG PO CAPS
30.0000 mg | ORAL_CAPSULE | Freq: Every day | ORAL | Status: DC
  Administered 2017-04-24 – 2017-04-27 (×4): 30 mg via ORAL
  Filled 2017-04-24 (×4): qty 2

## 2017-04-24 NOTE — Plan of Care (Signed)
Problem: Activity: Goal: Will verbalize the importance of balancing activity with adequate rest periods Outcome: Not Progressing Patient was not able to attend any activities since patient was sleeping.  Problem: Coping: Goal: Ability to cope will improve Outcome: Progressing Patient is appropriate with staff & peers.  Problem: Nutritional: Goal: Ability to achieve adequate nutritional intake will improve Outcome: Progressing Patient got a good appetite.

## 2017-04-24 NOTE — Progress Notes (Signed)
Patient was sleeping till this afternoon.Patient states "I am over medicated."Visible in the milieu in the afternoon.Denies SI,HI and AVH.Minimal interactions with peers.Compliant with medications.Did not attend groups.Appetite and energy level fair.Support and encouragement given.

## 2017-04-24 NOTE — [facility] Group Notes (Signed)
LCSW Group Therapy Note  04/24/2017 1:00pm  Type of Therapy/Topic:  Group Therapy:  Emotion Regulation  Participation Level:  Did Not Attend   Description of Group:   The purpose of this group is to assist patients in learning to regulate negative emotions and experience positive emotions. Patients will be guided to discuss ways in which they have been vulnerable to their negative emotions. These vulnerabilities will be juxtaposed with experiences of positive emotions or situations, and patients will be challenged to use positive emotions to combat negative ones. Special emphasis will be placed on coping with negative emotions in conflict situations, and patients will process healthy conflict resolution skills.  Therapeutic Goals: 1. Patient will identify two positive emotions or experiences to reflect on in order to balance out negative emotions 2. Patient will label two or more emotions that they find the most difficult to experience 3. Patient will demonstrate positive conflict resolution skills through discussion and/or role plays  Summary of Patient Progress:       Therapeutic Modalities:   Cognitive Behavioral Therapy Feelings Identification Dialectical Behavioral Therapy   Glennon Mac, LCSW 04/24/2017 4:55 PM

## 2017-04-24 NOTE — Progress Notes (Signed)
[facility] MD Progress Note  04/24/2017 8:57 AM Greg Elsie Ra.  MRN:  [id number]  Subjective:   History of present illness. Information was obtained from the patient, the chart, and his mother on the phone. The patient was completed by his mother for escalating symptoms of mania with psychosis. The patient became threatening to his elderly parents, refused to take some of his medications, has not been able to sleep, and has been increasingly agitated. He also states started bizarre behavior with consuming large volumes of salt. He has also been neglectful his medical problems. He stopped taking his insulin and blood pressure medication believing that God cured him. The mother reports that the patient has been only partially compliant with medications, picking and choosing what to take. On admission his Lithium level was therapeutic while Depakote level was low. The patient has been taking oral Invega but he is very skilful to cheek his medication. He was on [place] in the past with good results. It is unclear why Invega injections were stopped. According to the mother, all summer long, the patient has been standing in the throat house naked with a necklace made O pieces of concrete, greeting passersby cars, and protesting against abortion. He lately been rather delusional believing that he is God. He grew his hear over the summer but recently buzz cut some of it.   04/18/2017. Greg [name] continues to be intrusive, irritable, loud and easily agitated. He took his medications. Hinda Glatter sustenna was not given as it has not arrived from the pharmacy. We had a discussion about starting Clozapine. The patient has never taken it but "heard about it". I had a long conversation with his mother who is in support. The patient has not been well all summer long  But only recently became threatening to his elderly mother. He has no somatic compalints. Slept better, appetite is good. He isolates in his room but is  approachable and even funny but goofy.  04/19/2017. Greg [name] seem slightly better. He is still restless and irritable but able to engage in conversation with me and with one of his peers. Somehow, Invega sustenna injection ordered on 9/19 has not been given. I renewed the order. The patient is compliant with medications and tolerates them well. There are no somatic complaints. He slept 6 hours with Restoril. We again discussed Clozapine addition. Everybody seems to like the idea but his poor compliance might be a problem. I will order CBC and register the patient.  Follow-up for the 22nd on Saturday. Patient was pleasant in interaction today. He has not been aggressive loud or very dramatic today so far. Patient has no complaints about the clozapine. Slept more adequately last night. Denies suicidal ideation. Still smiles an awful lot and seems a little odd in his thinking but not intrusively bizarre  Follow-up for today Sunday the 23rd. Patient has no complaints. He says he feels like he is doing fine. He interacts with some other patients appropriately. Has not been aggressive or threatening and is able to hold conversations without getting into his psychotic delusions. Appears to be compliant with medicine. Nurses report that in the morning he is extremely sedated and think perhaps he needs less sedating medicine at night. The nurses also thought that his urine had a foul smell and that he needed a urine analysis. Patient seemed puzzled by that saying he had no problems with his urine. The urinalysis in fact is completely normal.  04/22/2017. Greg [name] did not sleep  well last night as recorded by nursing staff. He feeks that he slept very well. Over the weekend, he appeared sedated and his medications were held or lowered. He himself, has no complaints and feels better. No racing thoughts. Reports no somatic complaints. He is already drooling from 75 mg of Clozapine. He still did not receine invega  sustenna injection. Spoke with the pharamacist.  04/23/2017. Greg [name] is rather irritable and somewhat confused about his treatment. He seems not to understand the clozapine titration is a slow process will take a few days. His discharge is not expected until next week. After long conversation he seems to accept it. He has absolutely no complaints and feels he is ready for discharge. We will increase clozapine to 200 today.   04/24/2017. Greg [name] is too sedated after we increased Clozapine to 200 mg last night. He is also drooling profusely. He has sleep apnea and excessive drooling may be dangerous with the risk of aspiration pneumonia. He has no somatic complaints. No program participation.   Per nursing: D:Pt denies SI/HI/AVH, affect is flat but brightens upon approach. Pt is pleasant and cooperative, his thoughts are less disorganized, speech is soft and tangential. Pt appears less anxious and he is interacting with peers and staff appropriately.  A:Pt was offered support and encouragement. Pt was given scheduled medications. Pt was encouraged to attend groups. Q 15 minute checks were done for safety.  R:Pt attends groups and interacts well with peers and staff. Pt is taking medication. Pt has no complaints.Pt receptive to treatment and safety maintained on unit.  Principal Problem: Schizoaffective disorder, bipolar type (HCC) Diagnosis:   Patient Active Problem List   Diagnosis Date Noted  . Schizoaffective disorder, bipolar type (HCC) [F25.0] 03/28/2016  . Acute cholecystitis with chronic cholecystitis [K81.2] 03/27/2016  . Anxiety [F41.9]   . 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 Bipolar 2 disorder (HCC) [F31.81]   . Hyponatremia [E87.1] 10/06/2015  . Essential hypertension [I10] 11/28/2014  . Dyslipidemia [E78.5] 11/28/2014  . Obesity [E66.9] 11/28/2014  . Diabetes (HCC) [E11.9] 11/28/2014  . Tobacco use disorder [F17.200] 11/28/2014   Total Time spent with patient:  30 minutes  Past Psychiatric History: long history of bipolar manic episodes, multiple hospitalizations and medication trials, poor treatment compliance.  Past Medical History:  Past Medical History:  Diagnosis Date  . Bipolar 1 disorder (HCC)   . Diabetes (HCC)   . Dyslipidemia   . HTN (hypertension)   . Obesity   . Paranoia (HCC)   . Schizo affective schizophrenia [facility])     Past Surgical History:  Procedure Laterality Date  . APPENDECTOMY  51 years old  . CHOLECYSTECTOMY N/A 03/30/2016   Procedure: LAPAROSCOPIC CHOLECYSTECTOMY;  Surgeon: Gladis Riffle, MD;  Location: [facility] ORS;  Service: General;  Laterality: N/A;  . TONSILLECTOMY  51 year old   Family History:  Family History  Problem Relation Age of Onset  . Depression Mother   . Mental illness Mother   . Depression Father   . Mental illness Father   . Depression Sister   . Mental illness Sister    Family Psychiatric  History: grandmother with schizophrenia. Social History:  History  Alcohol Use No     History  Drug Use No    Social History   Social History  . Marital status: Single    Spouse name: N/A  . Number of children: N/A  . Years of education: N/A   Social History Main Topics  .  Smoking status: Current Every Day Smoker    Packs/day: 0.50    Years: 8.00    Types: Cigarettes  . Smokeless tobacco: Current User    Types: Snuff  . Alcohol use No  . Drug use: No  . Sexual activity: No   Other Topics Concern  . None   Social History Narrative  . None   Additional Social History: the patient is disabled from mental illness. He lives with his elderly parents who no longer are able to care for him.                        Sleep: Fair  Appetite:  Fair  Current Medications: Current Facility-Administered Medications  Medication Dose Route Frequency Provider Last Rate Last Dose  . acetaminophen (TYLENOL) tablet 650 mg  650 mg Oral Q6H PRN [name], [name], MD      . alum & mag  hydroxide-simeth (MAALOX/MYLANTA) 200-200-20 MG/5ML suspension 30 mL  30 mL Oral Q4H PRN [name], [name], MD      . cloZAPine (CLOZARIL) tablet 200 mg  200 mg Oral QHS Jisela Merlino B, MD   200 mg at 04/23/17 2133  . divalproex (DEPAKOTE) DR tablet 750 mg  750 mg Oral Q12H [name], [name], MD   750 mg at 04/24/17 0854  . hydrOXYzine (ATARAX/VISTARIL) tablet 50 mg  50 mg Oral TID PRN [name], [name], MD   50 mg at 04/22/17 1412  . insulin glargine (LANTUS) injection 10 Units  10 Units Subcutaneous Daily [name], [name], MD   10 Units at 04/23/17 2135  . lithium carbonate (ESKALITH) CR tablet 450 mg  450 mg Oral Q12H [name], [name], MD   450 mg at 04/24/17 0854  . magnesium hydroxide (MILK OF MAGNESIA) suspension 30 mL  30 mL Oral Daily PRN [name], [name], MD      . metFORMIN (GLUCOPHAGE) tablet 1,000 mg  1,000 mg Oral BID WC [name], [name], MD   1,000 mg at 04/24/17 0854  . metoprolol succinate (TOPROL-XL) 24 hr tablet 50 mg  50 mg Oral Daily [name], [name], MD   50 mg at 04/24/17 0854  . nicotine (NICODERM CQ - dosed in mg/24 hours) patch 21 mg  21 mg Transdermal Daily Jaikob Borgwardt B, MD   21 mg at 04/24/17 0855  . paliperidone (INVEGA SUSTENNA) injection 234 mg  234 mg Intramuscular Q28 days Oscar Forman B, MD   234 mg at 04/22/17 1701  . paliperidone (INVEGA) 24 hr tablet 6 mg  6 mg Oral Q breakfast Kol Consuegra B, MD   6 mg at 04/24/17 0854  . pneumococcal 23 valent vaccine (PNU-IMMUNE) injection 0.5 mL  0.5 mL Intramuscular Tomorrow-1000 Artemio Dobie B, MD      . temazepam (RESTORIL) capsule 30 mg  30 mg Oral QHS PRN Varian Innes B, MD   30 mg at 04/23/17 2132    Lab Results:  Results for orders placed or performed during the hospital encounter of 04/16/17 (from the past 48 hour(s))  Glucose, capillary     Status: Abnormal   Collection Time: 04/22/17 11:32 AM  Result Value Ref Range   Glucose-Capillary 115 (H) 65 - 99 mg/dL   Comment 1 Notify RN    Glucose, capillary     Status: Abnormal   Collection Time: 04/22/17  4:07 PM  Result Value Ref Range   Glucose-Capillary 116 (H) 65 - 99 mg/dL   Comment 1  Notify RN   Glucose, capillary     Status: Abnormal   Collection Time: 04/22/17  8:40 PM  Result Value Ref Range   Glucose-Capillary 112 (H) 65 - 99 mg/dL  Glucose, capillary     Status: Abnormal   Collection Time: 04/23/17  7:52 AM  Result Value Ref Range   Glucose-Capillary 120 (H) 65 - 99 mg/dL  Glucose, capillary     Status: Abnormal   Collection Time: 04/23/17 11:37 AM  Result Value Ref Range   Glucose-Capillary 131 (H) 65 - 99 mg/dL   Comment 1 Notify RN   Glucose, capillary     Status: Abnormal   Collection Time: 04/23/17  4:18 PM  Result Value Ref Range   Glucose-Capillary 110 (H) 65 - 99 mg/dL   Comment 1 Document in Chart   Glucose, capillary     Status: None   Collection Time: 04/23/17  9:29 PM  Result Value Ref Range   Glucose-Capillary 96 65 - 99 mg/dL  Glucose, capillary     Status: Abnormal   Collection Time: 04/24/17  7:03 AM  Result Value Ref Range   Glucose-Capillary 123 (H) 65 - 99 mg/dL    Blood Alcohol level:  Lab Results  Component Value Date   ETH <5 04/14/2017   ETH <5 10/06/2015    Metabolic Disorder Labs: Lab Results  Component Value Date   HGBA1C 10.9 (H) 04/17/2017   MPG 266.13 04/17/2017   MPG 154 02/09/2015   Lab Results  Component Value Date   PROLACTIN 43.3 (H) 04/17/2017   PROLACTIN 45.2 (H) 10/07/2015   Lab Results  Component Value Date   CHOL 103 04/17/2017   TRIG 110 04/17/2017   HDL 34 (L) 04/17/2017   CHOLHDL 3.0 04/17/2017   VLDL 22 04/17/2017   LDLCALC 47 04/17/2017   LDLCALC 44 10/07/2015    Physical Findings: AIMS:  , ,  ,  ,    CIWA:    COWS:     Musculoskeletal: Strength & Muscle Tone: within normal limits Gait & Station: normal Patient leans: N/A  Psychiatric Specialty Exam: Physical Exam  Nursing note and vitals reviewed. Psychiatric: His  speech is normal. His affect is blunt. He is slowed and withdrawn. Thought content is delusional. Thought content is not paranoid. Cognition and memory are normal. He expresses impulsivity.    Review of Systems  Constitutional: Negative.   HENT: Negative.   Eyes: Negative.   Respiratory: Negative.   Cardiovascular: Negative.   Gastrointestinal: Negative.   Genitourinary: Negative.   Musculoskeletal: Negative.   Skin: Negative.   Neurological: Negative.   Endo/Heme/Allergies: Negative.   Psychiatric/Behavioral: The patient has insomnia.     Blood pressure 121/65, pulse 97, temperature 98 F (36.7 C), temperature source Oral, resp. rate 17, height 5\' 9"  (1.753 m), weight 111.6 kg (246 lb), SpO2 97 %.Body mass index is 36.33 kg/m.  General Appearance: Disheveled  Eye Contact:  Good  Speech:  Slow  Volume:  Decreased  Mood:  Depressed  Affect:  Inappropriate  Thought Process:  Disorganized and Descriptions of Associations: Loose  Orientation:  Full (Time, Place, and Person)  Thought Content:  Delusions and Paranoid Ideation  Suicidal Thoughts:  No  Homicidal Thoughts:  No  Memory:  Immediate;   Fair Recent;   Fair Remote;   Fair  Judgement:  Poor  Insight:  Lacking  Psychomotor Activity:  Increased  Concentration:  Concentration: Poor and Attention Span: Poor  Recall:  Poor  Fund of  Knowledge:  Fair  Language:  Fair  Akathisia:  No  Handed:  Right  AIMS (if indicated):     Assets:  Communication Skills Desire for Improvement Financial Resources/Insurance Housing Leisure Time Resilience Social Support  ADL's:  Intact  Cognition:  WNL  Sleep:  Number of Hours: 4.5     Treatment Plan Summary: Daily contact with patient to assess and evaluate symptoms and progress in treatment and Medication management   Greg [name] is a 51 year old male with history of schizoaffective disorder admitted floridly manic in spite of reasonable medication compliance.  1. Mood and  psychosis. He has been maintained on a combination of oral Invega, lithium, and Depakote. We continue all his medications. The patient agreed to [place] injection. It was given on 04/22/2017. We also started Clozapine. I will scale it back to 100 mg nightly and give ipatroprium for droolong.   2. Insomnia. Slept only 4.5 hours. We restart Restoril.    3. Hypertension. He is on metoprolol.   4. Diabetes. He is on ADA diet, Lantus and metformin, blood glucose monitoring, SSI. Input from diabetes nurse coordinator is appreciated. Living well with diabetes book ordered.  5. Smoking. Nicotine patch is available.  6. Sleep apnea. He is on CPAP.  7. Metabolic syndrome monitoring. Lipid panel and TSH are normal, hemoglobin A1c is 10.9.   8. EKG. Normal sinus rhythm, QTc 437.   9. Social. He lives with her elderly parents who no longer feel they can provide care. They will need advice on guardianship process and placement.  10. Disposition. To be established. He will likely be discharged back to his parents. He will follow up with [facility].    Kristine Linea, MD 04/24/2017, 8:57 AM

## 2017-04-24 NOTE — Progress Notes (Signed)
CSW spoke to [place].  She was contacted by [place] DSS guardianship worker who is going to send her some information.  She has not been contacted by APS and CSW told her that it did not sound like they were going to investigate the report made by CSW and that DSS recommended that police be called if pt becomes aggressive.  Karl Pock said they already do that.  CSW discussed ACT team with Karl Pock, who is in favor of this level of service.  CSW also encouraged her to pursue guardianship as it will take some time to complete that process.  Pt has previous aCT history with [facility].  CSW will make referral there. Garner Nash, MSW, LCSW Clinical Social Worker 04/24/2017 8:39 AM

## 2017-04-24 NOTE — [facility] Group Notes (Signed)
[facility] Group Notes:  (Nursing/MHT/Case Management/Adjunct)  Date:  04/24/2017  Time:  2:57 PM  Type of Therapy:  Psychoeducational Skills  Participation Level:  Did Not Attend   Wilson Singer 04/24/2017, 2:57 PM

## 2017-04-24 NOTE — Progress Notes (Signed)
D:Pt denies SI/HI/AVH, affect is flat but brightens upon approach. Pt is pleasant and cooperative, his thoughts are less disorganized, speech is soft and tangential. Pt appears less anxious and he is interacting with peers and staff appropriately.  A:Pt was offered support and encouragement. Pt was given scheduled medications. Pt was encouraged to attend groups. Q 15 minute checks were done for safety.  R:Pt attends groups and interacts well with peers and staff. Pt is taking medication. Pt has no complaints.Pt receptive to treatment and safety maintained on unit.  

## 2017-04-24 NOTE — Progress Notes (Signed)
Attempted to place on cpap and pt immediately pulled it off and said he could not breathe. CPAP left on unit.

## 2017-04-25 LAB — GLUCOSE, CAPILLARY: Glucose-Capillary: 137 mg/dL — ABNORMAL HIGH (ref 65–99)

## 2017-04-25 MED ORDER — PALIPERIDONE PALMITATE 156 MG/ML IM SUSP
156.0000 mg | Freq: Once | INTRAMUSCULAR | Status: AC
  Administered 2017-04-27: 156 mg via INTRAMUSCULAR
  Filled 2017-04-25 (×2): qty 1

## 2017-04-25 MED ORDER — CLOZAPINE 100 MG PO TABS
150.0000 mg | ORAL_TABLET | Freq: Every day | ORAL | Status: DC
  Administered 2017-04-25 – 2017-04-30 (×6): 150 mg via ORAL
  Filled 2017-04-25 (×6): qty 1

## 2017-04-25 NOTE — [facility] Group Notes (Signed)
[facility] LCSW Group Therapy Note  Date/Time: 04/25/17, 0930  Type of Therapy/Topic:  Group Therapy:  Balance in Life  Participation Level:  Did not attend  Description of Group:    This group will address the concept of balance and how it feels and looks when one is unbalanced. Patients will be encouraged to process areas in their lives that are out of balance, and identify reasons for remaining unbalanced. Facilitators will guide patients utilizing problem- solving interventions to address and correct the stressor making their life unbalanced. Understanding and applying boundaries will be explored and addressed for obtaining  and maintaining a balanced life. Patients will be encouraged to explore ways to assertively make their unbalanced needs known to significant others in their lives, using other group members and facilitator for support and feedback.  Therapeutic Goals: 1. Patient will identify two or more emotions or situations they have that consume much of in their lives. 2. Patient will identify signs/triggers that life has become out of balance:  3. Patient will identify two ways to set boundaries in order to achieve balance in their lives:  4. Patient will demonstrate ability to communicate their needs through discussion and/or role plays  Summary of Patient Progress:          Therapeutic Modalities:   Cognitive Behavioral Therapy Solution-Focused Therapy Assertiveness Training  Daleen Squibb, LCSW

## 2017-04-25 NOTE — Plan of Care (Signed)
Problem: Self-Care: Goal: Ability to participate in self-care as condition permits will improve Outcome: Progressing Requested supplies to take shower.

## 2017-04-25 NOTE — Progress Notes (Signed)
[facility] MD Progress Note  04/25/2017 8:15 AM Sender Elsie Ra.  MRN:  [id number]  Subjective:   History of present illness. Information was obtained from the patient, the chart, and his mother on the phone. The patient was completed by his mother for escalating symptoms of mania with psychosis. The patient became threatening to his elderly parents, refused to take some of his medications, has not been able to sleep, and has been increasingly agitated. He also states started bizarre behavior with consuming large volumes of salt. He has also been neglectful his medical problems. He stopped taking his insulin and blood pressure medication believing that God cured him. The mother reports that the patient has been only partially compliant with medications, picking and choosing what to take. On admission his Lithium level was therapeutic while Depakote level was low. The patient has been taking oral Invega but he is very skilful to cheek his medication. He was on [place] in the past with good results. It is unclear why Invega injections were stopped. According to the mother, all summer long, the patient has been standing in the throat house naked with a necklace made O pieces of concrete, greeting passersby cars, and protesting against abortion. He lately been rather delusional believing that he is God. He grew his hear over the summer but recently buzz cut some of it.   04/18/2017. Mr. Pellow continues to be intrusive, irritable, loud and easily agitated. He took his medications. Hinda Glatter sustenna was not given as it has not arrived from the pharmacy. We had a discussion about starting Clozapine. The patient has never taken it but "heard about it". I had a long conversation with his mother who is in support. The patient has not been well all summer long  But only recently became threatening to his elderly mother. He has no somatic compalints. Slept better, appetite is good. He isolates in his room but is  approachable and even funny but goofy.  04/19/2017. Mr. Arboleda seem slightly better. He is still restless and irritable but able to engage in conversation with me and with one of his peers. Somehow, Invega sustenna injection ordered on 9/19 has not been given. I renewed the order. The patient is compliant with medications and tolerates them well. There are no somatic complaints. He slept 6 hours with Restoril. We again discussed Clozapine addition. Everybody seems to like the idea but his poor compliance might be a problem. I will order CBC and register the patient.  Follow-up for the 22nd on Saturday. Patient was pleasant in interaction today. He has not been aggressive loud or very dramatic today so far. Patient has no complaints about the clozapine. Slept more adequately last night. Denies suicidal ideation. Still smiles an awful lot and seems a little odd in his thinking but not intrusively bizarre  Follow-up for today Sunday the 23rd. Patient has no complaints. He says he feels like he is doing fine. He interacts with some other patients appropriately. Has not been aggressive or threatening and is able to hold conversations without getting into his psychotic delusions. Appears to be compliant with medicine. Nurses report that in the morning he is extremely sedated and think perhaps he needs less sedating medicine at night. The nurses also thought that his urine had a foul smell and that he needed a urine analysis. Patient seemed puzzled by that saying he had no problems with his urine. The urinalysis in fact is completely normal.  04/22/2017. Mr. [name] did not sleep  well last night as recorded by nursing staff. He feeks that he slept very well. Over the weekend, he appeared sedated and his medications were held or lowered. He himself, has no complaints and feels better. No racing thoughts. Reports no somatic complaints. He is already drooling from 75 mg of Clozapine. He still did not receine invega  sustenna injection. Spoke with the pharamacist.  04/23/2017. Mr. Trammel is rather irritable and somewhat confused about his treatment. He seems not to understand the clozapine titration is a slow process will take a few days. His discharge is not expected until next week. After long conversation he seems to accept it. He has absolutely no complaints and feels he is ready for discharge. We will increase clozapine to 200 today.   04/24/2017. Mr. Raeanne Barry is too sedated after we increased Clozapine to 200 mg last night. He is also drooling profusely. He has sleep apnea and excessive drooling may be dangerous with the risk of aspiration pneumonia. He has no somatic complaints. No program participation.   04/25/2017. Mr. Conran is up and out of his room today. He seems very excited about the prospect of going home He does not realize that he is noted.[name] He wants to continue his current pain to support pro-life agenda. He has second thoughts after a while thinking that it may not ary since he believes that Garnet Koyanagi is going to win. We decreased clozapine dose last night and we'll continue the titration slowly. He reports good sleep. She has not been participating in programming. There are no somatic complaints.  Per nursing: Pt alert and oriented x 4. Pt  mobility  is slow. Pt appropriately interacts with staff and peers. Pt denies SI, HI or A/V hallucinations. Pt contracted to safety. Pt refused to use CPAP, pt stated he didn't want his CPAP. Pt was educated on reasons for CPAP, pt still refused. Pt was med compliant. Denies pain/discomfort at this time. 15 min safety checks continues.   Principal Problem: Schizoaffective disorder, bipolar type (HCC) Diagnosis:   Patient Active Problem List   Diagnosis Date Noted  . Schizoaffective disorder, bipolar type (HCC) [F25.0] 03/28/2016  . Acute cholecystitis with chronic cholecystitis [K81.2] 03/27/2016  . Anxiety [F41.9]   . Benign familial tremor [G25.0]  10/12/2015  . Bipolar 2 disorder (HCC) [F31.81]   . Hyponatremia [E87.1] 10/06/2015  . Essential hypertension [I10] 11/28/2014  . Dyslipidemia [E78.5] 11/28/2014  . Obesity [E66.9] 11/28/2014  . Diabetes (HCC) [E11.9] 11/28/2014  . Tobacco use disorder [F17.200] 11/28/2014   Total Time spent with patient: 30 minutes  Past Psychiatric History: long history of bipolar manic episodes, multiple hospitalizations and medication trials, poor treatment compliance.  Past Medical History:  Past Medical History:  Diagnosis Date  . Bipolar 1 disorder (HCC)   . Diabetes (HCC)   . Dyslipidemia   . HTN (hypertension)   . Obesity   . Paranoia (HCC)   . Schizo affective schizophrenia [facility])     Past Surgical History:  Procedure Laterality Date  . APPENDECTOMY  51 years old  . CHOLECYSTECTOMY N/A 03/30/2016   Procedure: LAPAROSCOPIC CHOLECYSTECTOMY;  Surgeon: Gladis Riffle, MD;  Location: [facility] ORS;  Service: General;  Laterality: N/A;  . TONSILLECTOMY  51 year old   Family History:  Family History  Problem Relation Age of Onset  . Depression Mother   . Mental illness Mother   . Depression Father   . Mental illness Father   . Depression Sister   . Mental  illness Sister    Family Psychiatric  History: grandmother with schizophrenia. Social History:  History  Alcohol Use No     History  Drug Use No    Social History   Social History  . Marital status: Single    Spouse name: N/A  . Number of children: N/A  . Years of education: N/A   Social History Main Topics  . Smoking status: Current Every Day Smoker    Packs/day: 0.50    Years: 8.00    Types: Cigarettes  . Smokeless tobacco: Current User    Types: Snuff  . Alcohol use No  . Drug use: No  . Sexual activity: No   Other Topics Concern  . None   Social History Narrative  . None   Additional Social History: the patient is disabled from mental illness. He lives with his elderly parents who no longer are able to  care for him.                        Sleep: Fair  Appetite:  Fair  Current Medications: Current Facility-Administered Medications  Medication Dose Route Frequency Provider Last Rate Last Dose  . acetaminophen (TYLENOL) tablet 650 mg  650 mg Oral Q6H PRN [name], [name], MD      . alum & mag hydroxide-simeth (MAALOX/MYLANTA) 200-200-20 MG/5ML suspension 30 mL  30 mL Oral Q4H PRN [name], [name], MD      . cloZAPine (CLOZARIL) tablet 100 mg  100 mg Oral QHS Analyce Tavares B, MD   100 mg at 04/24/17 2147  . divalproex (DEPAKOTE) DR tablet 750 mg  750 mg Oral Q12H [name], [name], MD   750 mg at 04/24/17 2147  . hydrOXYzine (ATARAX/VISTARIL) tablet 50 mg  50 mg Oral TID PRN [name], [name], MD   50 mg at 04/22/17 1412  . insulin glargine (LANTUS) injection 10 Units  10 Units Subcutaneous Daily [name], [name], MD   10 Units at 04/24/17 2148  . lithium carbonate (ESKALITH) CR tablet 450 mg  450 mg Oral Q12H [name], [name], MD   450 mg at 04/24/17 2147  . magnesium hydroxide (MILK OF MAGNESIA) suspension 30 mL  30 mL Oral Daily PRN [name], [name], MD      . metFORMIN (GLUCOPHAGE) tablet 1,000 mg  1,000 mg Oral BID WC [name], [name], MD   1,000 mg at 04/24/17 1734  . metoprolol succinate (TOPROL-XL) 24 hr tablet 50 mg  50 mg Oral Daily [name], [name], MD   50 mg at 04/24/17 0854  . nicotine (NICODERM CQ - dosed in mg/24 hours) patch 21 mg  21 mg Transdermal Daily Livianna Petraglia B, MD   21 mg at 04/24/17 0855  . paliperidone (INVEGA SUSTENNA) injection 234 mg  234 mg Intramuscular Q28 days Kule Gascoigne B, MD   234 mg at 04/22/17 1701  . paliperidone (INVEGA) 24 hr tablet 6 mg  6 mg Oral Q breakfast Yojan Paskett B, MD   6 mg at 04/24/17 0854  . pneumococcal 23 valent vaccine (PNU-IMMUNE) injection 0.5 mL  0.5 mL Intramuscular Tomorrow-1000 Enaya Howze B, MD      . temazepam (RESTORIL) capsule 30 mg  30 mg Oral QHS Maddeline Roorda B, MD   30 mg at  04/24/17 2149    Lab Results:  Results for orders placed or performed during the hospital encounter of 04/16/17 (from the past 48 hour(s))  Glucose, capillary  Status: Abnormal   Collection Time: 04/23/17 11:37 AM  Result Value Ref Range   Glucose-Capillary 131 (H) 65 - 99 mg/dL   Comment 1 Notify RN   Glucose, capillary     Status: Abnormal   Collection Time: 04/23/17  4:18 PM  Result Value Ref Range   Glucose-Capillary 110 (H) 65 - 99 mg/dL   Comment 1 Document in Chart   Glucose, capillary     Status: None   Collection Time: 04/23/17  9:29 PM  Result Value Ref Range   Glucose-Capillary 96 65 - 99 mg/dL  Glucose, capillary     Status: Abnormal   Collection Time: 04/24/17  7:03 AM  Result Value Ref Range   Glucose-Capillary 123 (H) 65 - 99 mg/dL  Glucose, capillary     Status: Abnormal   Collection Time: 04/24/17  8:52 AM  Result Value Ref Range   Glucose-Capillary 165 (H) 65 - 99 mg/dL  Glucose, capillary     Status: Abnormal   Collection Time: 04/24/17 11:59 AM  Result Value Ref Range   Glucose-Capillary 121 (H) 65 - 99 mg/dL   Comment 1 Notify RN   Glucose, capillary     Status: Abnormal   Collection Time: 04/24/17  4:31 PM  Result Value Ref Range   Glucose-Capillary 103 (H) 65 - 99 mg/dL   Comment 1 Notify RN     Blood Alcohol level:  Lab Results  Component Value Date   ETH <5 04/14/2017   ETH <5 10/06/2015    Metabolic Disorder Labs: Lab Results  Component Value Date   HGBA1C 10.9 (H) 04/17/2017   MPG 266.13 04/17/2017   MPG 154 02/09/2015   Lab Results  Component Value Date   PROLACTIN 43.3 (H) 04/17/2017   PROLACTIN 45.2 (H) 10/07/2015   Lab Results  Component Value Date   CHOL 103 04/17/2017   TRIG 110 04/17/2017   HDL 34 (L) 04/17/2017   CHOLHDL 3.0 04/17/2017   VLDL 22 04/17/2017   LDLCALC 47 04/17/2017   LDLCALC 44 10/07/2015    Physical Findings: AIMS:  , ,  ,  ,    CIWA:    COWS:     Musculoskeletal: Strength & Muscle Tone:  within normal limits Gait & Station: normal Patient leans: N/A  Psychiatric Specialty Exam: Physical Exam  Nursing note and vitals reviewed. Psychiatric: His speech is normal. His affect is blunt. He is slowed and withdrawn. Thought content is delusional. Thought content is not paranoid. Cognition and memory are normal. He expresses impulsivity.    Review of Systems  Constitutional: Negative.   HENT: Negative.   Eyes: Negative.   Respiratory: Negative.   Cardiovascular: Negative.   Gastrointestinal: Negative.   Genitourinary: Negative.   Musculoskeletal: Negative.   Skin: Negative.   Neurological: Negative.   Endo/Heme/Allergies: Negative.   Psychiatric/Behavioral: The patient has insomnia.     Blood pressure 117/71, pulse 96, temperature 98.4 F (36.9 C), resp. rate 17, height  (1.753 m), weight 111.6 kg (246 lb), SpO2 97 %.Body mass index is 36.33 kg/m.  General Appearance: Disheveled  Eye Contact:  Good  Speech:  Slow  Volume:  Decreased  Mood:  Depressed  Affect:  Inappropriate  Thought Process:  Disorganized and Descriptions of Associations: Loose  Orientation:  Full (Time, Place, and Person)  Thought Content:  Delusions and Paranoid Ideation  Suicidal Thoughts:  No  Homicidal Thoughts:  No  Memory:  Immediate;   Fair Recent;   Fair Remote;  Fair  Judgement:  Poor  Insight:  Lacking  Psychomotor Activity:  Increased  Concentration:  Concentration: Poor and Attention Span: Poor  Recall:  Poor  Fund of Knowledge:  Fair  Language:  Fair  Akathisia:  No  Handed:  Right  AIMS (if indicated):     Assets:  Communication Skills Desire for Improvement Financial Resources/Insurance Housing Leisure Time Resilience Social Support  ADL's:  Intact  Cognition:  WNL  Sleep:  Number of Hours: 4.45     Treatment Plan Summary: Daily contact with patient to assess and evaluate symptoms and progress in treatment and Medication management   Mr. Vaile is a  51 year old male with history of schizoaffective disorder admitted floridly manic in spite of reasonable medication compliance.  1. Mood and psychosis. He has been maintained on a combination of oral Invega, lithium, and Depakote. We continue all his medications. The patient agreed to [place] injection. It was given on 04/22/2017 next dose tomorrow. We also started Clozapine titration slowly as he is oversedated. I will give ipratroprium for drooling.   2. Insomnia. Slept only 4.5 hours. We restart Restoril.    3. Hypertension. He is on metoprolol.   4. Diabetes. He is on ADA diet, Lantus and metformin, blood glucose monitoring, SSI. Input from diabetes nurse coordinator is appreciated. Living well with diabetes book ordered.  5. Smoking. Nicotine patch is available.  6. Sleep apnea. He is on CPAP.  7. Metabolic syndrome monitoring. Lipid panel and TSH are normal, hemoglobin A1c is 10.9.   8. EKG. Normal sinus rhythm, QTc 437.   9. Social. He lives with her elderly parents who no longer feel they can provide care. They will need advice on guardianship process and placement.  10. Disposition. To be established. He will likely be discharged back to his parents. He will follow up with [facility].    Kristine Linea, MD 04/25/2017, 8:15 AM

## 2017-04-25 NOTE — [facility] Group Notes (Signed)
LCSW Group Therapy Note 04/25/2017 1:15pm  Type of Therapy and Topic:  Group Therapy:  Setting Goals  Participation Level:  Active  Description of Group: In this process group, patients discussed using strengths to work toward goals and address challenges.  Patients identified two positive things about themselves and one goal they were working on.  Patients were given the opportunity to share openly and support each other's plan for self-empowerment.  The group discussed the value of gratitude and were encouraged to have a daily reflection of positive characteristics or circumstances.  Patients were encouraged to identify a plan to utilize their strengths to work on current challenges and goals.  Therapeutic Goals 1. Patient will verbalize personal strengths/positive qualities and relate how these can assist with achieving desired personal goals 2. Patients will verbalize affirmation of peers plans for personal change and goal setting 3. Patients will explore the value of gratitude and positive focus as related to successful achievement of goals 4. Patients will verbalize a plan for regular reinforcement of personal positive qualities and circumstances.  Summary of Patient Progress: Pt identified his goal as "cotinuing to progress towards this goal of discharge." Pt reports feeling like his opinion and voice are not heard in the hospital.    Therapeutic Modalities Cognitive Behavioral Therapy Motivational Interviewing    Verdene Lennert, LCSW 04/25/2017 12:49 PM

## 2017-04-25 NOTE — Plan of Care (Signed)
Problem: Coping: Goal: Ability to demonstrate self-control will improve Outcome: Progressing Pt will demonstrate self control the entire shift.   

## 2017-04-25 NOTE — Progress Notes (Signed)
First am patient in dayroom asleep.  When aroused patient to eat breakfast stated that he had already eaten.  MHT informed that he had not eaten breakfast.  When informed that he had not states "I think that I am overmedicated."   After breakfast and receiving medication returned to his room and slept.  Did not eat lunch.  No group attendance.  Support and encouragement offered.  Safety rounds maintained.

## 2017-04-25 NOTE — Progress Notes (Addendum)
Pt alert and oriented x 4. Pt  mobility  is slow. Pt appropriately interacts with staff and peers. Pt denies SI, HI or A/V hallucinations. Pt contracted to safety. Pt refused to use CPAP, pt stated he didn't want his CPAP. Pt was educated on reasons for CPAP, pt still refused. Pt was med compliant. Denies pain/discomfort at this time. 15 min safety checks continues.

## 2017-04-26 LAB — CBC WITH DIFFERENTIAL/PLATELET
Basophils Absolute: 0 10*3/uL (ref 0–0.1)
Basophils Relative: 1 %
Eosinophils Absolute: 0.4 10*3/uL (ref 0–0.7)
Eosinophils Relative: 4 %
HCT: 39.8 % — ABNORMAL LOW (ref 40.0–52.0)
Hemoglobin: 13.4 g/dL (ref 13.0–18.0)
Lymphocytes Relative: 35 %
Lymphs Abs: 3.2 10*3/uL (ref 1.0–3.6)
MCH: 28.2 pg (ref 26.0–34.0)
MCHC: 33.6 g/dL (ref 32.0–36.0)
MCV: 83.9 fL (ref 80.0–100.0)
Monocytes Absolute: 0.9 10*3/uL (ref 0.2–1.0)
Monocytes Relative: 10 %
Neutro Abs: 4.6 10*3/uL (ref 1.4–6.5)
Neutrophils Relative %: 50 %
Platelets: 224 10*3/uL (ref 150–440)
RBC: 4.74 MIL/uL (ref 4.40–5.90)
RDW: 13.2 % (ref 11.5–14.5)
WBC: 9.1 10*3/uL (ref 3.8–10.6)

## 2017-04-26 MED ORDER — PALIPERIDONE ER 3 MG PO TB24
3.0000 mg | ORAL_TABLET | Freq: Every day | ORAL | Status: DC
  Administered 2017-04-27 – 2017-04-28 (×2): 3 mg via ORAL
  Filled 2017-04-26 (×2): qty 1

## 2017-04-26 MED ORDER — IPRATROPIUM BROMIDE 0.06 % NA SOLN
2.0000 | Freq: Every day | NASAL | Status: DC
  Administered 2017-04-26 – 2017-05-08 (×13): 2 via NASAL
  Filled 2017-04-26: qty 15

## 2017-04-26 NOTE — Plan of Care (Signed)
Problem: Coping: Goal: Ability to verbalize feelings will improve Outcome: Progressing Patient verbalized feelings to staff.    

## 2017-04-26 NOTE — Progress Notes (Signed)
Clozapine [facility] Monitoring:  Patient ordered clozapine 150 mg at bedtime. Was not taking PTA per med rec.  ANC 4800 on 9/21.  ANC 4600 on 9/28  Labs submitted to clozapine registry. Patient eligible to receive clozapine with weekly lab monitoring.  Will recheck CBC w/diff in 1 week.  Marty Heck, PharmD 04/26/17 11:41 AM

## 2017-04-26 NOTE — [facility] Group Notes (Signed)
[facility] Group Notes:  (Nursing/MHT/Case Management/Adjunct)  Date:  04/26/2017  Time:  12:17 AM  Type of Therapy:  Evening Wrap-up Group  Participation Level:  Did Not Attend  Participation Quality:  N/A  Affect:  N/A  Cognitive:  N/A  Insight:  None  Engagement in Group:  Did Not Attend  Modes of Intervention:  Discussion  Summary of Progress/Problems:  Tomasita Morrow 04/26/2017, 12:17 AM

## 2017-04-26 NOTE — Progress Notes (Signed)
D:Pt denies SI/HI/AVH, affect is flat but brightens upon approach. Pt is pleasant and cooperative, his thoughts are less disorganized, speech is soft and tangential. Pt appears less anxious and he is interacting with peers and staff appropriately.  A:Pt was offered support and encouragement. Pt was given scheduled medications. Pt was encouraged to attend groups. Q 15 minute checks were done for safety.  R:Pt attends groups and interacts well with peers and staff. Pt is taking medication. Pt has no complaints.Pt receptive to treatment and safety maintained on unit.

## 2017-04-26 NOTE — Progress Notes (Signed)
[facility] MD Progress Note  04/26/2017 10:28 AM Greg [name].  MRN:  [id number]  Subjective:   History of present illness. Information was obtained from the patient, the chart, and his mother on the phone. The patient was completed by his mother for escalating symptoms of mania with psychosis. The patient became threatening to his elderly parents, refused to take some of his medications, has not been able to sleep, and has been increasingly agitated. He also states started bizarre behavior with consuming large volumes of salt. He has also been neglectful his medical problems. He stopped taking his insulin and blood pressure medication believing that God cured him. The mother reports that the patient has been only partially compliant with medications, picking and choosing what to take. On admission his Lithium level was therapeutic while Depakote level was low. The patient has been taking oral Invega but he is very skilful to cheek his medication. He was on [place] in the past with good results. It is unclear why Invega injections were stopped. According to the mother, all summer long, the patient has been standing in the throat house naked with a necklace made O pieces of concrete, greeting passersby cars, and protesting against abortion. He lately been rather delusional believing that he is God. He grew his hear over the summer but recently buzz cut some of it.   04/24/2017. Greg [name] is too sedated after we increased Clozapine to 200 mg last night. He is also drooling profusely. He has sleep apnea and excessive drooling may be dangerous with the risk of aspiration pneumonia. He has no somatic complaints. No program participation.   04/25/2017. Greg [name] is up and out of his room today. He seems very excited about the prospect of going home He does not realize that he is noted.[name] He wants to continue his current pain to support pro-life agenda. He has second thoughts after a while thinking  that it may not ary since he believes that Greg [name] is going to win. We decreased clozapine dose last night and we'll continue the titration slowly. He reports good sleep. She has not been participating in programming. There are no somatic complaints.  04/26/2017. Greg [name] is no longer manic but appears unfocused and sleepy even on low dose of Clozapine. We will titrate very slowly. His hygiene is terrible. I observed him this morning sleeping. He does have sleep apnea, sleeps on his back and remains at high risk of aspiration. He refuses CPAP at night. He is mostly in his room but does come to class on occasion.   Per nursing: D:Pt denies SI/HI/AVH, affect is flat but brightens upon approach. Pt is pleasant and cooperative, his thoughts are less disorganized, speech is soft and tangential. Pt appears less anxious and he is interacting with peers and staff appropriately.  A:Pt was offered support and encouragement. Pt was given scheduled medications. Pt was encouraged to attend groups. Q 15 minute checks were done for safety.  R:Pt attends groups and interacts well with peers and staff. Pt is taking medication. Pt has no complaints.Pt receptive to treatment and safety maintained on unit.  Principal Problem: Schizoaffective disorder, bipolar type (HCC) Diagnosis:   Patient Active Problem List   Diagnosis Date Noted  . Schizoaffective disorder, bipolar type (HCC) [F25.0] 03/28/2016  . Acute cholecystitis with chronic cholecystitis [K81.2] 03/27/2016  . Anxiety [F41.9]   . 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 Bipolar 2 disorder (HCC) [F31.81]   . Hyponatremia [E87.1] 10/06/2015  .  Essential hypertension [I10] 11/28/2014  . Dyslipidemia [E78.5] 11/28/2014  . Obesity [E66.9] 11/28/2014  . Diabetes (HCC) [E11.9] 11/28/2014  . Tobacco use disorder [F17.200] 11/28/2014   Total Time spent with patient: 30 minutes  Past Psychiatric History: long history of bipolar manic episodes,  multiple hospitalizations and medication trials, poor treatment compliance.  Past Medical History:  Past Medical History:  Diagnosis Date  . Bipolar 1 disorder (HCC)   . Diabetes (HCC)   . Dyslipidemia   . HTN (hypertension)   . Obesity   . Paranoia (HCC)   . Schizo affective schizophrenia [facility])     Past Surgical History:  Procedure Laterality Date  . APPENDECTOMY  51 years old  . CHOLECYSTECTOMY N/A 03/30/2016   Procedure: LAPAROSCOPIC CHOLECYSTECTOMY;  Surgeon: Gladis Riffle, MD;  Location: [facility] ORS;  Service: General;  Laterality: N/A;  . TONSILLECTOMY  51 year old   Family History:  Family History  Problem Relation Age of Onset  . Depression Mother   . Mental illness Mother   . Depression Father   . Mental illness Father   . Depression Sister   . Mental illness Sister    Family Psychiatric  History: grandmother with schizophrenia. Social History:  History  Alcohol Use No     History  Drug Use No    Social History   Social History  . Marital status: Single    Spouse name: N/A  . Number of children: N/A  . Years of education: N/A   Social History Main Topics  . Smoking status: Current Every Day Smoker    Packs/day: 0.50    Years: 8.00    Types: Cigarettes  . Smokeless tobacco: Current User    Types: Snuff  . Alcohol use No  . Drug use: No  . Sexual activity: No   Other Topics Concern  . None   Social History Narrative  . None   Additional Social History: the patient is disabled from mental illness. He lives with his elderly parents who no longer are able to care for him.                        Sleep: Fair  Appetite:  Fair  Current Medications: Current Facility-Administered Medications  Medication Dose Route Frequency Provider Last Rate Last Dose  . acetaminophen (TYLENOL) tablet 650 mg  650 mg Oral Q6H PRN [name], [name], MD      . alum & mag hydroxide-simeth (MAALOX/MYLANTA) 200-200-20 MG/5ML suspension 30 mL  30 mL Oral Q4H  PRN [name], [name], MD      . cloZAPine (CLOZARIL) tablet 150 mg  150 mg Oral QHS Ryer Asato B, MD   150 mg at 04/25/17 2149  . divalproex (DEPAKOTE) DR tablet 750 mg  750 mg Oral Q12H [name], [name], MD   750 mg at 04/26/17 0955  . hydrOXYzine (ATARAX/VISTARIL) tablet 50 mg  50 mg Oral TID PRN [name], [name], MD   50 mg at 04/22/17 1412  . insulin glargine (LANTUS) injection 10 Units  10 Units Subcutaneous Daily [name], [name], MD   10 Units at 04/25/17 2152  . lithium carbonate (ESKALITH) CR tablet 450 mg  450 mg Oral Q12H [name], [name], MD   450 mg at 04/26/17 0955  . magnesium hydroxide (MILK OF MAGNESIA) suspension 30 mL  30 mL Oral Daily PRN [name], [name], MD      . metFORMIN (GLUCOPHAGE) tablet 1,000 mg  1,000 mg Oral BID WC [name], [name], MD   1,000 mg at 04/26/17 0955  . metoprolol succinate (TOPROL-XL) 24 hr tablet 50 mg  50 mg Oral Daily [name], [name], MD   50 mg at 04/26/17 0954  . nicotine (NICODERM CQ - dosed in mg/24 hours) patch 21 mg  21 mg Transdermal Daily Porcha Deblanc B, MD   21 mg at 04/26/17 0957  . paliperidone (INVEGA SUSTENNA) injection 156 mg  156 mg Intramuscular Once Zakkery Dorian B, MD      . paliperidone (INVEGA SUSTENNA) injection 234 mg  234 mg Intramuscular Q28 days Loise Esguerra B, MD   234 mg at 04/22/17 1701  . paliperidone (INVEGA) 24 hr tablet 6 mg  6 mg Oral Q breakfast Keiden Deskin B, MD   6 mg at 04/26/17 0954  . pneumococcal 23 valent vaccine (PNU-IMMUNE) injection 0.5 mL  0.5 mL Intramuscular Tomorrow-1000 Rosiland Sen B, MD      . temazepam (RESTORIL) capsule 30 mg  30 mg Oral QHS Delia Slatten B, MD   30 mg at 04/25/17 2150    Lab Results:  Results for orders placed or performed during the hospital encounter of 04/16/17 (from the past 48 hour(s))  Glucose, capillary     Status: Abnormal   Collection Time: 04/24/17 11:59 AM  Result Value Ref Range   Glucose-Capillary 121 (H) 65 - 99 mg/dL    Comment 1 Notify RN   Glucose, capillary     Status: Abnormal   Collection Time: 04/24/17  4:31 PM  Result Value Ref Range   Glucose-Capillary 103 (H) 65 - 99 mg/dL   Comment 1 Notify RN   Glucose, capillary     Status: Abnormal   Collection Time: 04/25/17  9:48 PM  Result Value Ref Range   Glucose-Capillary 137 (H) 65 - 99 mg/dL  CBC with Differential/Platelet     Status: Abnormal   Collection Time: 04/26/17  9:30 AM  Result Value Ref Range   WBC 9.1 3.8 - 10.6 K/uL   RBC 4.74 4.40 - 5.90 MIL/uL   Hemoglobin 13.4 13.0 - 18.0 g/dL   HCT 19.1 (L) 47.8 - 29.5 %   MCV 83.9 80.0 - 100.0 fL   MCH 28.2 26.0 - 34.0 pg   MCHC 33.6 32.0 - 36.0 g/dL   RDW 62.1 30.8 - 65.7 %   Platelets 224 150 - 440 K/uL   Neutrophils Relative % 50 %   Neutro Abs 4.6 1.4 - 6.5 K/uL   Lymphocytes Relative 35 %   Lymphs Abs 3.2 1.0 - 3.6 K/uL   Monocytes Relative 10 %   Monocytes Absolute 0.9 0.2 - 1.0 K/uL   Eosinophils Relative 4 %   Eosinophils Absolute 0.4 0 - 0.7 K/uL   Basophils Relative 1 %   Basophils Absolute 0.0 0 - 0.1 K/uL    Blood Alcohol level:  Lab Results  Component Value Date   ETH <5 04/14/2017   ETH <5 10/06/2015    Metabolic Disorder Labs: Lab Results  Component Value Date   HGBA1C 10.9 (H) 04/17/2017   MPG 266.13 04/17/2017   MPG 154 02/09/2015   Lab Results  Component Value Date   PROLACTIN 43.3 (H) 04/17/2017   PROLACTIN 45.2 (H) 10/07/2015   Lab Results  Component Value Date   CHOL 103 04/17/2017   TRIG 110 04/17/2017   HDL 34 (L) 04/17/2017   CHOLHDL 3.0 04/17/2017   VLDL 22 04/17/2017   LDLCALC 47  04/17/2017   LDLCALC 44 10/07/2015    Physical Findings: AIMS:  , ,  ,  ,    CIWA:    COWS:     Musculoskeletal: Strength & Muscle Tone: within normal limits Gait & Station: normal Patient leans: N/A  Psychiatric Specialty Exam: Physical Exam  Nursing note and vitals reviewed. Psychiatric: His speech is normal. His affect is blunt. He is slowed and  withdrawn. Thought content is delusional. Thought content is not paranoid. Cognition and memory are normal. He expresses impulsivity.    Review of Systems  Constitutional: Negative.   HENT: Negative.   Eyes: Negative.   Respiratory: Negative.   Cardiovascular: Negative.   Gastrointestinal: Negative.   Genitourinary: Negative.   Musculoskeletal: Negative.   Skin: Negative.   Neurological: Negative.   Endo/Heme/Allergies: Negative.   Psychiatric/Behavioral: The patient has insomnia.     Blood pressure (!) 112/52, pulse 96, temperature 97.7 F (36.5 C), temperature source Oral, resp. rate 20, height  (1.753 m), weight 111.6 kg (246 lb), SpO2 97 %.Body mass index is 36.33 kg/m.  General Appearance: Disheveled  Eye Contact:  Good  Speech:  Slow  Volume:  Decreased  Mood:  Depressed  Affect:  Inappropriate  Thought Process:  Disorganized and Descriptions of Associations: Loose  Orientation:  Full (Time, Place, and Person)  Thought Content:  Delusions and Paranoid Ideation  Suicidal Thoughts:  No  Homicidal Thoughts:  No  Memory:  Immediate;   Fair Recent;   Fair Remote;   Fair  Judgement:  Poor  Insight:  Lacking  Psychomotor Activity:  Increased  Concentration:  Concentration: Poor and Attention Span: Poor  Recall:  Poor  Fund of Knowledge:  Fair  Language:  Fair  Akathisia:  No  Handed:  Right  AIMS (if indicated):     Assets:  Communication Skills Desire for Improvement Financial Resources/Insurance Housing Leisure Time Resilience Social Support  ADL's:  Intact  Cognition:  WNL  Sleep:  Number of Hours: 5.5     Treatment Plan Summary: Daily contact with patient to assess and evaluate symptoms and progress in treatment and Medication management   Greg [name] is a 51 year old male with history of schizoaffective disorder admitted floridly manic in spite of reasonable medication compliance.  1. Mood and psychosis. He has been maintained on a combination  of oral Invega, lithium, and Depakote and agreed to take [place] on 04/22/2017, second shot 04/26/2017. We also started Clozapine titration slowly as he is oversedated. I will give ipratroprium for drooling and start lowering oral invega.  2. Insomnia. Slept better with Restoril.    3. Hypertension. He is on metoprolol.   4. Diabetes. He is on ADA diet, Lantus and metformin, blood glucose monitoring, SSI. Input from diabetes nurse coordinator is appreciated. Living well with diabetes book ordered.  5. Smoking. Nicotine patch is available.  6. Sleep apnea. He is on CPAP.  7. Metabolic syndrome monitoring. Lipid panel and TSH are normal, hemoglobin A1c is 10.9.   8. EKG. Normal sinus rhythm, QTc 437.   9. Social. He lives with her elderly parents who no longer feel they can provide care. They will need advice on guardianship process and placement.  10. Disposition. To be established. He will likely be discharged back to his parents. He will follow up with [facility].    Kristine Linea, MD 04/26/2017, 10:28 AM

## 2017-04-26 NOTE — Tx Team (Signed)
Interdisciplinary Treatment and Diagnostic Plan Update  04/26/2017 Time of Session: 1120am Greg [name]. MRN: [id number]  Principal Diagnosis: Schizoaffective disorder, bipolar type (HCC)  Secondary Diagnoses: Principal Problem:   Schizoaffective disorder, bipolar type (HCC) Active Problems:   Essential hypertension   Obesity   Diabetes (HCC)   Tobacco use disorder   Current Medications:  Current Facility-Administered Medications  Medication Dose Route Frequency Provider Last Rate Last Dose  . acetaminophen (TYLENOL) tablet 650 mg  650 mg Oral Q6H PRN [name], [name], MD      . alum & mag hydroxide-simeth (MAALOX/MYLANTA) 200-200-20 MG/5ML suspension 30 mL  30 mL Oral Q4H PRN [name], [name], MD      . cloZAPine (CLOZARIL) tablet 150 mg  150 mg Oral QHS Pucilowska, Jolanta B, MD   150 mg at 04/25/17 2149  . divalproex (DEPAKOTE) DR tablet 750 mg  750 mg Oral Q12H [name], [name], MD   750 mg at 04/26/17 0955  . hydrOXYzine (ATARAX/VISTARIL) tablet 50 mg  50 mg Oral TID PRN [name], [name], MD   50 mg at 04/22/17 1412  . insulin glargine (LANTUS) injection 10 Units  10 Units Subcutaneous Daily [name], [name], MD   10 Units at 04/25/17 2152  . ipratropium (ATROVENT) 0.06 % nasal spray 2 spray  2 spray Each Nare QHS Pucilowska, Jolanta B, MD      . lithium carbonate (ESKALITH) CR tablet 450 mg  450 mg Oral Q12H [name], [name], MD   450 mg at 04/26/17 0955  . magnesium hydroxide (MILK OF MAGNESIA) suspension 30 mL  30 mL Oral Daily PRN [name], [name], MD      . metFORMIN (GLUCOPHAGE) tablet 1,000 mg  1,000 mg Oral BID WC [name], [name], MD   1,000 mg at 04/26/17 0955  . metoprolol succinate (TOPROL-XL) 24 hr tablet 50 mg  50 mg Oral Daily [name], [name], MD   50 mg at 04/26/17 0954  . nicotine (NICODERM CQ - dosed in mg/24 hours) patch 21 mg  21 mg Transdermal Daily Pucilowska, Jolanta B, MD   21 mg at 04/26/17 0957  . paliperidone (INVEGA SUSTENNA) injection 156 mg  156 mg  Intramuscular Once Pucilowska, Jolanta B, MD      . paliperidone (INVEGA SUSTENNA) injection 234 mg  234 mg Intramuscular Q28 days Pucilowska, Jolanta B, MD   234 mg at 04/22/17 1701  . [START ON 04/27/2017] paliperidone (INVEGA) 24 hr tablet 3 mg  3 mg Oral Q breakfast Pucilowska, Jolanta B, MD      . pneumococcal 23 valent vaccine (PNU-IMMUNE) injection 0.5 mL  0.5 mL Intramuscular Tomorrow-1000 Pucilowska, Jolanta B, MD      . temazepam (RESTORIL) capsule 30 mg  30 mg Oral QHS Pucilowska, Jolanta B, MD   30 mg at 04/25/17 2150   PTA Medications: Prescriptions Prior to Admission  Medication Sig Dispense Refill Last Dose  . amantadine (SYMMETREL) 100 MG capsule Take 100 mg by mouth 2 (two) times daily.     . benztropine (COGENTIN) 2 MG tablet Take 1 tablet by mouth daily.     . divalproex (DEPAKOTE) 250 MG DR tablet Take 3 tablets (750 mg total) by mouth every 12 (twelve) hours. 180 tablet 0 Taking  . insulin glargine (LANTUS) 100 UNIT/ML injection Inject 0.1 mLs (10 Units total) into the skin daily. 10 mL 0 Taking  . lithium carbonate (ESKALITH) 450 MG CR tablet Take 450 mg by mouth 2 (two) times  daily.     . lithium carbonate 300 MG capsule Take 300 mg by mouth 3 (three) times daily.     . metFORMIN (GLUCOPHAGE) 1000 MG tablet Take 1 tablet (1,000 mg total) by mouth 2 (two) times daily with a meal. 60 tablet 0 Taking  . metoprolol succinate (TOPROL-XL) 50 MG 24 hr tablet Take 1 tablet (50 mg total) by mouth daily. Take with or immediately following a meal. 30 tablet 0 Taking  . propranolol (INDERAL) 20 MG tablet Take 20 mg by mouth 2 (two) times daily.       Patient Stressors: Financial difficulties Health problems Medication change or noncompliance  Patient Strengths: Ability for insight Capable of independent living Supportive family/friends  Treatment Modalities: Medication Management, Group therapy, Case management,  1 to 1 session with clinician, Psychoeducation, Recreational  therapy.   Physician Treatment Plan for Primary Diagnosis: Schizoaffective disorder, bipolar type (HCC) Long Term Goal(s): Improvement in symptoms so as ready for discharge Improvement in symptoms so as ready for discharge   Short Term Goals: Ability to identify changes in lifestyle to reduce recurrence of condition will improve Ability to verbalize feelings will improve Ability to disclose and discuss suicidal ideas Ability to demonstrate self-control will improve Ability to identify and develop effective coping behaviors will improve Compliance with prescribed medications will improve Ability to identify triggers associated with substance abuse/mental health issues will improve Compliance with prescribed medications will improve  Medication Management: Evaluate patient's response, side effects, and tolerance of medication regimen.  Therapeutic Interventions: 1 to 1 sessions, Unit Group sessions and Medication administration.  Evaluation of Outcomes: Progressing  Physician Treatment Plan for Secondary Diagnosis: Principal Problem:   Schizoaffective disorder, bipolar type (HCC) Active Problems:   Essential hypertension   Obesity   Diabetes (HCC)   Tobacco use disorder  Long Term Goal(s): Improvement in symptoms so as ready for discharge Improvement in symptoms so as ready for discharge   Short Term Goals: Ability to identify changes in lifestyle to reduce recurrence of condition will improve Ability to verbalize feelings will improve Ability to disclose and discuss suicidal ideas Ability to demonstrate self-control will improve Ability to identify and develop effective coping behaviors will improve Compliance with prescribed medications will improve Ability to identify triggers associated with substance abuse/mental health issues will improve Compliance with prescribed medications will improve     Medication Management: Evaluate patient's response, side effects, and  tolerance of medication regimen.  Therapeutic Interventions: 1 to 1 sessions, Unit Group sessions and Medication administration.  Evaluation of Outcomes: Progressing   RN Treatment Plan for Primary Diagnosis: Schizoaffective disorder, bipolar type (HCC) Long Term Goal(s): Knowledge of disease and therapeutic regimen to maintain health will improve  Short Term Goals: Ability to verbalize frustration and anger appropriately will improve, Ability to participate in decision making will improve and Ability to identify and develop effective coping behaviors will improve  Medication Management: RN will administer medications as ordered by provider, will assess and evaluate patient's response and provide education to patient for prescribed medication. RN will report any adverse and/or side effects to prescribing provider.  Therapeutic Interventions: 1 on 1 counseling sessions, Psychoeducation, Medication administration, Evaluate responses to treatment, Monitor vital signs and CBGs as ordered, Perform/monitor CIWA, COWS, AIMS and Fall Risk screenings as ordered, Perform wound care treatments as ordered.  Evaluation of Outcomes: Not Progressing   LCSW Treatment Plan for Primary Diagnosis: Schizoaffective disorder, bipolar type (HCC) Long Term Goal(s): Safe transition to appropriate next level of care  at discharge, Engage patient in therapeutic group addressing interpersonal concerns.  Short Term Goals: Engage patient in aftercare planning with referrals and resources, Increase ability to appropriately verbalize feelings, Increase emotional regulation and Identify triggers associated with mental health/substance abuse issues  Therapeutic Interventions: Assess for all discharge needs, 1 to 1 time with Social worker, Explore available resources and support systems, Assess for adequacy in community support network, Educate family and significant other(s) on suicide prevention, Complete Psychosocial  Assessment, Interpersonal group therapy.  Evaluation of Outcomes: Not Progressing   Progress in Treatment: Attending groups: No Participating in groups: No Taking medication as prescribed: Yes. Toleration medication: Yes. Family/Significant other contact made: Yes, individual(s) contacted:  mother Patient understands diagnosis: Yes. Discussing patient identified problems/goals with staff: Yes. Medical problems stabilized or resolved: Yes. Denies suicidal/homicidal ideation: Yes. Issues/concerns per patient self-inventory: No. Other:    New problem(s) identified: No, Describe:     New Short Term/Long Term Goal(s):  Discharge Plan or Barriers: CSW assessing for appropriate plan.  Reason for Continuation of Hospitalization: Mania Medication stabilization  Estimated Length of Stay:5-7 days  Attendees: Patient: 04/26/2017   Physician: Dr. Jennet Maduro, MD 04/26/2017  Nursing: Leonia Reader, RN 04/26/2017  RN Care Manager: 04/26/2017   Social Worker: Daleen Squibb, LCSW 04/26/2017   Recreational Therapist:  04/26/2017   Other:  04/26/2017   Other:  04/26/2017   Other: 04/26/2017           Scribe for Treatment Team: [name], LCSW 04/26/2017 2:45 PM

## 2017-04-26 NOTE — Progress Notes (Signed)
Denies SI/HI/AVH.  Disorganized and drowsy.  Stays in room in bed except for meals and medication pass.  When asked why sleeping so much, states "You know why, to much medication"  Support and encouragement offered.  Safety checks maintained.

## 2017-04-27 NOTE — [facility] Group Notes (Signed)
LCSW Group Therapy Note  04/27/2017 1:00pm  Type of Therapy and Topic:  Group Therapy:  Cognitive Distortions  Participation Level:  Did Not Attend   Description of Group:    Patients in this group will be introduced to the topic of cognitive distortions.  Patients will identify and describe cognitive distortions, describe the feelings these distortions create for them.  Patients will identify one or more situations in their personal life where they have cognitively distorted thinking and will verbalize challenging this cognitive distortion through positive thinking skills.  Patients will practice the skill of using positive affirmations to challenge cognitive distortions using affirmation cards.    Therapeutic Goals:  1. Patient will identify two or more cognitive distortions they have used 2. Patient will identify one or more emotions that stem from use of a cognitive distortion 3. Patient will demonstrate use of a positive affirmation to counter a cognitive distortion through discussion and/or role play. 4. Patient will describe one way cognitive distortions can be detrimental to wellness   Summary of Patient Progress:     Therapeutic Modalities:   Cognitive Behavioral Therapy Motivational Interviewing   Glennon Mac, LCSW 04/27/2017 2:36 PM

## 2017-04-27 NOTE — Plan of Care (Signed)
Problem: Coping: Goal: Ability to cope will improve Outcome: Progressing Patient initiated talking to writer about "hopefully being discharged tomorrow".  He states he knows is waiting for an Invega injection and is "hoping that means I can leave when I get it".  Overall, Patient is less pressured in speech and content is more clear than last weekend. He had less of a mess in his room but had a full laundry container with urine odor on them.  He started wash tonight with assistance.  He attended wrap up group and snack with peers. Mother visited earlier in the evening and brought him some clothes.  Patient's mood was calm this HS.  Problem: Safety: Goal: Ability to remain free from injury will improve Outcome: Progressing Patient was given medications tonight based on when he planned to go to bed.  He was observed up walking in the hall about one hour after receiving Restoril.  He had his eyes partially open and was walking with a staggered gait.  He was redirected back to bed and complied.  He sleeps in his street clothes and keeps a light on in his room.  He allowed writer to dim the light and responded well to reassurance that staff is checking on him.  Safety is maintained.

## 2017-04-27 NOTE — Progress Notes (Signed)
[facility] MD Progress Note  04/27/2017 2:47 PM Greg [name].  MRN:  [id number]  Subjective:   History of present illness. Information was obtained from the patient, the chart, and his mother on the phone. The patient was completed by his mother for escalating symptoms of mania with psychosis. The patient became threatening to his elderly parents, refused to take some of his medications, has not been able to sleep, and has been increasingly agitated. He also states started bizarre behavior with consuming large volumes of salt. He has also been neglectful his medical problems. He stopped taking his insulin and blood pressure medication believing that God cured him. The mother reports that the patient has been only partially compliant with medications, picking and choosing what to take. On admission his Lithium level was therapeutic while Depakote level was low. The patient has been taking oral Invega but he is very skilful to cheek his medication. He was on [place] in the past with good results. It is unclear why Invega injections were stopped. According to the mother, all summer long, the patient has been standing in the throat house naked with a necklace made O pieces of concrete, greeting passersby cars, and protesting against abortion. He lately been rather delusional believing that he is God. He grew his hear over the summer but recently buzz cut some of it.   04/24/2017. [name] is too sedated after we increased Clozapine to 200 mg last night. He is also drooling profusely. He has sleep apnea and excessive drooling may be dangerous with the risk of aspiration pneumonia. He has no somatic complaints. No program participation.   04/25/2017. [name] is up and out of his room today. He seems very excited about the prospect of going home He does not realize that he is noted.[name] He wants to continue his current pain to support pro-life agenda. He has second thoughts after a while thinking that  it may not ary since he believes that Greg [name] is going to win. We decreased clozapine dose last night and we'll continue the titration slowly. He reports good sleep. She has not been participating in programming. There are no somatic complaints.  04/26/2017. [name] is no longer manic but appears unfocused and sleepy even on low dose of Clozapine. We will titrate very slowly. His hygiene is terrible. I observed him this morning sleeping. He does have sleep apnea, sleeps on his back and remains at high risk of aspiration. He refuses CPAP at night. He is mostly in his room but does come to class on occasion.   Follow-up for the 29th. Patient seen chart reviewed. Patient has no new complaints. He tells me that he is looking forward to going home. He tells me that he is no longer planning to run for president and that he understands that was part of his mania. He still does not have the best hygiene but he is not completely malodorous. He was pleasant in his interactions with me.  Per nursing: D:Pt denies SI/HI/AVH, affect is flat but brightens upon approach. Pt is pleasant and cooperative, his thoughts are less disorganized, speech is soft and tangential. Pt appears less anxious and he is interacting with peers and staff appropriately.  A:Pt was offered support and encouragement. Pt was given scheduled medications. Pt was encouraged to attend groups. Q 15 minute checks were done for safety.  R:Pt attends groups and interacts well with peers and staff. Pt is taking medication. Pt has no complaints.Pt receptive  to treatment and safety maintained on unit.  Principal Problem: Schizoaffective disorder, bipolar type (HCC) Diagnosis:   Patient Active Problem List   Diagnosis Date Noted  . Schizoaffective disorder, bipolar type (HCC) [F25.0] 03/28/2016  . Acute cholecystitis with chronic cholecystitis [K81.2] 03/27/2016  . Anxiety [F41.9]   . 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 Bipolar 2  disorder (HCC) [F31.81]   . Hyponatremia [E87.1] 10/06/2015  . Essential hypertension [I10] 11/28/2014  . Dyslipidemia [E78.5] 11/28/2014  . Obesity [E66.9] 11/28/2014  . Diabetes (HCC) [E11.9] 11/28/2014  . Tobacco use disorder [F17.200] 11/28/2014   Total Time spent with patient: 30 minutes  Past Psychiatric History: long history of bipolar manic episodes, multiple hospitalizations and medication trials, poor treatment compliance.  Past Medical History:  Past Medical History:  Diagnosis Date  . Bipolar 1 disorder (HCC)   . Diabetes (HCC)   . Dyslipidemia   . HTN (hypertension)   . Obesity   . Paranoia (HCC)   . Schizo affective schizophrenia [facility])     Past Surgical History:  Procedure Laterality Date  . APPENDECTOMY  51 years old  . CHOLECYSTECTOMY N/A 03/30/2016   Procedure: LAPAROSCOPIC CHOLECYSTECTOMY;  Surgeon: Gladis Riffle, MD;  Location: [facility] ORS;  Service: General;  Laterality: N/A;  . TONSILLECTOMY  51 year old   Family History:  Family History  Problem Relation Age of Onset  . Depression Mother   . Mental illness Mother   . Depression Father   . Mental illness Father   . Depression Sister   . Mental illness Sister    Family Psychiatric  History: grandmother with schizophrenia. Social History:  History  Alcohol Use No     History  Drug Use No    Social History   Social History  . Marital status: Single    Spouse name: N/A  . Number of children: N/A  . Years of education: N/A   Social History Main Topics  . Smoking status: Current Every Day Smoker    Packs/day: 0.50    Years: 8.00    Types: Cigarettes  . Smokeless tobacco: Current User    Types: Snuff  . Alcohol use No  . Drug use: No  . Sexual activity: No   Other Topics Concern  . None   Social History Narrative  . None   Additional Social History: the patient is disabled from mental illness. He lives with his elderly parents who no longer are able to care for him.                         Sleep: Fair  Appetite:  Fair  Current Medications: Current Facility-Administered Medications  Medication Dose Route Frequency Provider Last Rate Last Dose  . acetaminophen (TYLENOL) tablet 650 mg  650 mg Oral Q6H PRN Tayveon Lombardo T, MD      . alum & mag hydroxide-simeth (MAALOX/MYLANTA) 200-200-20 MG/5ML suspension 30 mL  30 mL Oral Q4H PRN Tehran Rabenold T, MD      . cloZAPine (CLOZARIL) tablet 150 mg  150 mg Oral QHS Pucilowska, Jolanta B, MD   150 mg at 04/26/17 2148  . divalproex (DEPAKOTE) DR tablet 750 mg  750 mg Oral Q12H Eleasha Cataldo T, MD   750 mg at 04/27/17 0940  . hydrOXYzine (ATARAX/VISTARIL) tablet 50 mg  50 mg Oral TID PRN Kwaku Mostafa, [name], MD   50 mg at 04/22/17 1412  . insulin glargine (LANTUS) injection 10 Units  10 Units Subcutaneous Daily Labrandon Knoch, [name], MD   10 Units at 04/26/17 2154  . ipratropium (ATROVENT) 0.06 % nasal spray 2 spray  2 spray Each Nare QHS Pucilowska, Jolanta B, MD   2 spray at 04/26/17 2149  . lithium carbonate (ESKALITH) CR tablet 450 mg  450 mg Oral Q12H Lennon Richins, [name], MD   450 mg at 04/27/17 0940  . magnesium hydroxide (MILK OF MAGNESIA) suspension 30 mL  30 mL Oral Daily PRN Almina Schul T, MD      . metFORMIN (GLUCOPHAGE) tablet 1,000 mg  1,000 mg Oral BID WC Amilcar Reever, [name], MD   1,000 mg at 04/27/17 0940  . metoprolol succinate (TOPROL-XL) 24 hr tablet 50 mg  50 mg Oral Daily Agnes Probert, [name], MD   50 mg at 04/27/17 0940  . nicotine (NICODERM CQ - dosed in mg/24 hours) patch 21 mg  21 mg Transdermal Daily Pucilowska, Jolanta B, MD   21 mg at 04/27/17 0941  . paliperidone (INVEGA SUSTENNA) injection 234 mg  234 mg Intramuscular Q28 days Pucilowska, Jolanta B, MD   234 mg at 04/22/17 1701  . paliperidone (INVEGA) 24 hr tablet 3 mg  3 mg Oral Q breakfast Pucilowska, Jolanta B, MD   3 mg at 04/27/17 0940  . pneumococcal 23 valent vaccine (PNU-IMMUNE) injection 0.5 mL  0.5 mL Intramuscular Tomorrow-1000 Pucilowska, Jolanta B, MD       . temazepam (RESTORIL) capsule 30 mg  30 mg Oral QHS Pucilowska, Jolanta B, MD   30 mg at 04/26/17 2153    Lab Results:  Results for orders placed or performed during the hospital encounter of 04/16/17 (from the past 48 hour(s))  Glucose, capillary     Status: Abnormal   Collection Time: 04/25/17  9:48 PM  Result Value Ref Range   Glucose-Capillary 137 (H) 65 - 99 mg/dL  CBC with Differential/Platelet     Status: Abnormal   Collection Time: 04/26/17  9:30 AM  Result Value Ref Range   WBC 9.1 3.8 - 10.6 K/uL   RBC 4.74 4.40 - 5.90 MIL/uL   Hemoglobin 13.4 13.0 - 18.0 g/dL   HCT 40.9 (L) 81.1 - 91.4 %   MCV 83.9 80.0 - 100.0 fL   MCH 28.2 26.0 - 34.0 pg   MCHC 33.6 32.0 - 36.0 g/dL   RDW 78.2 95.6 - 21.3 %   Platelets 224 150 - 440 K/uL   Neutrophils Relative % 50 %   Neutro Abs 4.6 1.4 - 6.5 K/uL   Lymphocytes Relative 35 %   Lymphs Abs 3.2 1.0 - 3.6 K/uL   Monocytes Relative 10 %   Monocytes Absolute 0.9 0.2 - 1.0 K/uL   Eosinophils Relative 4 %   Eosinophils Absolute 0.4 0 - 0.7 K/uL   Basophils Relative 1 %   Basophils Absolute 0.0 0 - 0.1 K/uL    Blood Alcohol level:  Lab Results  Component Value Date   ETH <5 04/14/2017   ETH <5 10/06/2015    Metabolic Disorder Labs: Lab Results  Component Value Date   HGBA1C 10.9 (H) 04/17/2017   MPG 266.13 04/17/2017   MPG 154 02/09/2015   Lab Results  Component Value Date   PROLACTIN 43.3 (H) 04/17/2017   PROLACTIN 45.2 (H) 10/07/2015   Lab Results  Component Value Date   CHOL 103 04/17/2017   TRIG 110 04/17/2017   HDL 34 (L) 04/17/2017   CHOLHDL 3.0 04/17/2017   VLDL 22 04/17/2017  LDLCALC 47 04/17/2017   LDLCALC 44 10/07/2015    Physical Findings: AIMS:  , ,  ,  ,    CIWA:    COWS:     Musculoskeletal: Strength & Muscle Tone: within normal limits Gait & Station: normal Patient leans: N/A  Psychiatric Specialty Exam: Physical Exam  Nursing note and vitals reviewed. Psychiatric: His speech is  normal. His affect is blunt. He is slowed and withdrawn. Thought content is not paranoid and not delusional. Cognition and memory are normal. He expresses impulsivity.    Review of Systems  Constitutional: Negative.   HENT: Negative.   Eyes: Negative.   Respiratory: Negative.   Cardiovascular: Negative.   Gastrointestinal: Negative.   Genitourinary: Negative.   Musculoskeletal: Negative.   Skin: Negative.   Neurological: Negative.   Endo/Heme/Allergies: Negative.   Psychiatric/Behavioral: The patient does not have insomnia.     Blood pressure 104/73, pulse 98, temperature 98.6 F (37 C), temperature source Oral, resp. rate 20, height  (1.753 m), weight 111.6 kg (246 lb), SpO2 97 %.Body mass index is 36.33 kg/m.  General Appearance: Disheveled  Eye Contact:  Good  Speech:  Slow  Volume:  Decreased  Mood:  Depressed  Affect:  Inappropriate  Thought Process:  Disorganized and Descriptions of Associations: Loose  Orientation:  Full (Time, Place, and Person)  Thought Content:  Delusions and Paranoid Ideation  Suicidal Thoughts:  No  Homicidal Thoughts:  No  Memory:  Immediate;   Fair Recent;   Fair Remote;   Fair  Judgement:  Poor  Insight:  Lacking  Psychomotor Activity:  Increased  Concentration:  Concentration: Poor and Attention Span: Poor  Recall:  Poor  Fund of Knowledge:  Fair  Language:  Fair  Akathisia:  No  Handed:  Right  AIMS (if indicated):     Assets:  Communication Skills Desire for Improvement Financial Resources/Insurance Housing Leisure Time Resilience Social Support  ADL's:  Intact  Cognition:  WNL  Sleep:  Number of Hours: 7.3     Treatment Plan Summary: Daily contact with patient to assess and evaluate symptoms and progress in treatment and Medication management   Greg [name] is a 51 year old male with history of schizoaffective disorder admitted floridly manic in spite of reasonable medication compliance.  1. Mood and psychosis. He  has been maintained on a combination of oral Invega, lithium, and Depakote and agreed to take [place] on 04/22/2017, second shot 04/26/2017. We also started Clozapine titration slowly as he is oversedated. I will give ipratroprium for drooling and start lowering oral invega.  2. Insomnia. Slept better with Restoril.    3. Hypertension. He is on metoprolol.   4. Diabetes. He is on ADA diet, Lantus and metformin, blood glucose monitoring, SSI. Input from diabetes nurse coordinator is appreciated. Living well with diabetes book ordered.  5. Smoking. Nicotine patch is available.  6. Sleep apnea. He is on CPAP.  7. Metabolic syndrome monitoring. Lipid panel and TSH are normal, hemoglobin A1c is 10.9.   8. EKG. Normal sinus rhythm, QTc 437.   9. Social. He lives with her elderly parents who no longer feel they can provide care. They will need advice on guardianship process and placement.  10. Disposition. To be established. He will likely be discharged back to his parents. He will follow up with [facility].   Patient seems to be improving tolerating medicine. He has no specific complaints today. He is able to calm out and interact with a few other  people. Not overly manic or aggressive.   Greg Rasmussen, MD 04/27/2017, 2:47 PM

## 2017-04-27 NOTE — Progress Notes (Signed)
Denies SI/HI/AVH. Clothes washed today.  Up more today.  Continues to isolate to himself.  Medication compliant.  No group attendance.  Support offered.  Safety rounds maintained.

## 2017-04-27 NOTE — [facility] Group Notes (Signed)
[facility] Group Notes:  (Nursing/MHT/Case Management/Adjunct)  Date:  04/27/2017  Time:  11:55 PM  Type of Therapy:  Evening Wrap-up Group  Participation Level:  Active  Participation Quality:  Appropriate and Attentive  Affect:  Appropriate  Cognitive:  Alert and Appropriate  Insight:  Appropriate and Improving  Engagement in Group:  Developing/Improving and Engaged  Modes of Intervention:  Discussion  Summary of Progress/Problems:  Tomasita Morrow 04/27/2017, 11:55 PM

## 2017-04-28 NOTE — Progress Notes (Signed)
[facility] MD Progress Note  04/28/2017 3:32 PM Greg [name].  MRN:  [id number]  Subjective:   History of present illness. Information was obtained from the patient, the chart, and his mother on the phone. The patient was completed by his mother for escalating symptoms of mania with psychosis. The patient became threatening to his elderly parents, refused to take some of his medications, has not been able to sleep, and has been increasingly agitated. He also states started bizarre behavior with consuming large volumes of salt. He has also been neglectful his medical problems. He stopped taking his insulin and blood pressure medication believing that God cured him. The mother reports that the patient has been only partially compliant with medications, picking and choosing what to take. On admission his Lithium level was therapeutic while Depakote level was low. The patient has been taking oral Invega but he is very skilful to cheek his medication. He was on [place] in the past with good results. It is unclear why Invega injections were stopped. According to the mother, all summer long, the patient has been standing in the throat house naked with a necklace made O pieces of concrete, greeting passersby cars, and protesting against abortion. He lately been rather delusional believing that he is God. He grew his hear over the summer but recently buzz cut some of it.   04/24/2017. Greg [name] is too sedated after we increased Clozapine to 200 mg last night. He is also drooling profusely. He has sleep apnea and excessive drooling may be dangerous with the risk of aspiration pneumonia. He has no somatic complaints. No program participation.   04/25/2017. Greg [name] is up and out of his room today. He seems very excited about the prospect of going home He does not realize that he is noted.[name] He wants to continue his current pain to support pro-life agenda. He has second thoughts after a while thinking that  it may not ary since he believes that Garnet Koyanagi is going to win. We decreased clozapine dose last night and we'll continue the titration slowly. He reports good sleep. She has not been participating in programming. There are no somatic complaints.  04/26/2017. Greg [name] is no longer manic but appears unfocused and sleepy even on low dose of Clozapine. We will titrate very slowly. His hygiene is terrible. I observed him this morning sleeping. He does have sleep apnea, sleeps on his back and remains at high risk of aspiration. He refuses CPAP at night. He is mostly in his room but does come to class on occasion.   Follow-up for the 29th. Patient seen chart reviewed. Patient has no new complaints. He tells me that he is looking forward to going home. He tells me that he is no longer planning to run for president and that he understands that was part of his mania. He still does not have the best hygiene but he is not completely malodorous. He was pleasant in his interactions with me.  Patient follow-up for the 30th. Did not express any delusions. Seems a little bit sleepy but his hygiene is better. He falls asleep sometimes sitting up in the day area. Not aggressive not agitated not expressing any obvious psychosis. Vital stable.  Per nursing: D:Pt denies SI/HI/AVH, affect is flat but brightens upon approach. Pt is pleasant and cooperative, his thoughts are less disorganized, speech is soft and tangential. Pt appears less anxious and he is interacting with peers and staff appropriately.  A:Pt was offered  support and encouragement. Pt was given scheduled medications. Pt was encouraged to attend groups. Q 15 minute checks were done for safety.  R:Pt attends groups and interacts well with peers and staff. Pt is taking medication. Pt has no complaints.Pt receptive to treatment and safety maintained on unit.  Principal Problem: Schizoaffective disorder, bipolar type (HCC) Diagnosis:   Patient Active  Problem List   Diagnosis Date Noted  . Schizoaffective disorder, bipolar type (HCC) [F25.0] 03/28/2016  . Acute cholecystitis with chronic cholecystitis [K81.2] 03/27/2016  . Anxiety [F41.9]   . 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 Bipolar 2 disorder (HCC) [F31.81]   . Hyponatremia [E87.1] 10/06/2015  . Essential hypertension [I10] 11/28/2014  . Dyslipidemia [E78.5] 11/28/2014  . Obesity [E66.9] 11/28/2014  . Diabetes (HCC) [E11.9] 11/28/2014  . Tobacco use disorder [F17.200] 11/28/2014   Total Time spent with patient: 30 minutes  Past Psychiatric History: long history of bipolar manic episodes, multiple hospitalizations and medication trials, poor treatment compliance.  Past Medical History:  Past Medical History:  Diagnosis Date  . Bipolar 1 disorder (HCC)   . Diabetes (HCC)   . Dyslipidemia   . HTN (hypertension)   . Obesity   . Paranoia (HCC)   . Schizo affective schizophrenia [facility])     Past Surgical History:  Procedure Laterality Date  . APPENDECTOMY  51 years old  . CHOLECYSTECTOMY N/A 03/30/2016   Procedure: LAPAROSCOPIC CHOLECYSTECTOMY;  Surgeon: Gladis Riffle, MD;  Location: [facility] ORS;  Service: General;  Laterality: N/A;  . TONSILLECTOMY  51 year old   Family History:  Family History  Problem Relation Age of Onset  . Depression Mother   . Mental illness Mother   . Depression Father   . Mental illness Father   . Depression Sister   . Mental illness Sister    Family Psychiatric  History: grandmother with schizophrenia. Social History:  History  Alcohol Use No     History  Drug Use No    Social History   Social History  . Marital status: Single    Spouse name: N/A  . Number of children: N/A  . Years of education: N/A   Social History Main Topics  . Smoking status: Current Every Day Smoker    Packs/day: 0.50    Years: 8.00    Types: Cigarettes  . Smokeless tobacco: Current User    Types: Snuff  . Alcohol use No  . Drug use: No  .  Sexual activity: No   Other Topics Concern  . None   Social History Narrative  . None   Additional Social History: the patient is disabled from mental illness. He lives with his elderly parents who no longer are able to care for him.                        Sleep: Fair  Appetite:  Fair  Current Medications: Current Facility-Administered Medications  Medication Dose Route Frequency Provider Last Rate Last Dose  . acetaminophen (TYLENOL) tablet 650 mg  650 mg Oral Q6H PRN [name], [name], MD      . alum & mag hydroxide-simeth (MAALOX/MYLANTA) 200-200-20 MG/5ML suspension 30 mL  30 mL Oral Q4H PRN [name], [name], MD      . cloZAPine (CLOZARIL) tablet 150 mg  150 mg Oral QHS Pucilowska, Jolanta B, MD   150 mg at 04/27/17 2101  . divalproex (DEPAKOTE) DR tablet 750 mg  750 mg Oral Q12H [name], John  T, MD   750 mg at 04/28/17 0803  . hydrOXYzine (ATARAX/VISTARIL) tablet 50 mg  50 mg Oral TID PRN [name], [name], MD   50 mg at 04/27/17 2250  . insulin glargine (LANTUS) injection 10 Units  10 Units Subcutaneous Daily [name], [name], MD   10 Units at 04/27/17 2104  . ipratropium (ATROVENT) 0.06 % nasal spray 2 spray  2 spray Each Nare QHS Pucilowska, Jolanta B, MD   2 spray at 04/27/17 2100  . lithium carbonate (ESKALITH) CR tablet 450 mg  450 mg Oral Q12H [name], [name], MD   450 mg at 04/28/17 0803  . magnesium hydroxide (MILK OF MAGNESIA) suspension 30 mL  30 mL Oral Daily PRN [name], [name], MD      . metFORMIN (GLUCOPHAGE) tablet 1,000 mg  1,000 mg Oral BID WC [name], [name], MD   1,000 mg at 04/28/17 0803  . metoprolol succinate (TOPROL-XL) 24 hr tablet 50 mg  50 mg Oral Daily [name], [name], MD   50 mg at 04/28/17 0803  . nicotine (NICODERM CQ - dosed in mg/24 hours) patch 21 mg  21 mg Transdermal Daily Pucilowska, Jolanta B, MD   21 mg at 04/28/17 0821  . paliperidone (INVEGA SUSTENNA) injection 234 mg  234 mg Intramuscular Q28 days Pucilowska, Jolanta B, MD   234 mg at  04/22/17 1701  . paliperidone (INVEGA) 24 hr tablet 3 mg  3 mg Oral Q breakfast Pucilowska, Jolanta B, MD   3 mg at 04/28/17 0803  . pneumococcal 23 valent vaccine (PNU-IMMUNE) injection 0.5 mL  0.5 mL Intramuscular Tomorrow-1000 Pucilowska, Jolanta B, MD      . temazepam (RESTORIL) capsule 30 mg  30 mg Oral QHS Pucilowska, Jolanta B, MD   30 mg at 04/27/17 2250    Lab Results:  No results found for this or any previous visit (from the past 48 hour(s)).  Blood Alcohol level:  Lab Results  Component Value Date   ETH <5 04/14/2017   ETH <5 10/06/2015    Metabolic Disorder Labs: Lab Results  Component Value Date   HGBA1C 10.9 (H) 04/17/2017   MPG 266.13 04/17/2017   MPG 154 02/09/2015   Lab Results  Component Value Date   PROLACTIN 43.3 (H) 04/17/2017   PROLACTIN 45.2 (H) 10/07/2015   Lab Results  Component Value Date   CHOL 103 04/17/2017   TRIG 110 04/17/2017   HDL 34 (L) 04/17/2017   CHOLHDL 3.0 04/17/2017   VLDL 22 04/17/2017   LDLCALC 47 04/17/2017   LDLCALC 44 10/07/2015    Physical Findings: AIMS:  , ,  ,  ,    CIWA:    COWS:     Musculoskeletal: Strength & Muscle Tone: within normal limits Gait & Station: normal Patient leans: N/A  Psychiatric Specialty Exam: Physical Exam  Nursing note and vitals reviewed. Constitutional: He appears well-developed and well-nourished.  HENT:  Head: Normocephalic and atraumatic.  Eyes: Pupils are equal, round, and reactive to light. Conjunctivae are normal.  Neck: Normal range of motion.  Cardiovascular: Normal heart sounds.   Respiratory: Effort normal.  GI: Soft.  Musculoskeletal: Normal range of motion.  Neurological: He is alert.  Skin: Skin is warm and dry.  Psychiatric: His speech is normal. His affect is blunt. He is slowed and withdrawn. Thought content is not paranoid and not delusional. Cognition and memory are normal. He expresses impulsivity.    Review of Systems  Constitutional: Negative.  HENT:  Negative.   Eyes: Negative.   Respiratory: Negative.   Cardiovascular: Negative.   Gastrointestinal: Negative.   Genitourinary: Negative.   Musculoskeletal: Negative.   Skin: Negative.   Neurological: Negative.   Endo/Heme/Allergies: Negative.   Psychiatric/Behavioral: The patient does not have insomnia.     Blood pressure 119/74, pulse 96, temperature 98.4 F (36.9 C), resp. rate 20, height  (1.753 m), weight 111.6 kg (246 lb), SpO2 97 %.Body mass index is 36.33 kg/m.  General Appearance: Disheveled  Eye Contact:  Good  Speech:  Slow  Volume:  Decreased  Mood:  Depressed  Affect:  Inappropriate  Thought Process:  Disorganized and Descriptions of Associations: Loose  Orientation:  Full (Time, Place, and Person)  Thought Content:  Delusions and Paranoid Ideation  Suicidal Thoughts:  No  Homicidal Thoughts:  No  Memory:  Immediate;   Fair Recent;   Fair Remote;   Fair  Judgement:  Poor  Insight:  Lacking  Psychomotor Activity:  Increased  Concentration:  Concentration: Poor and Attention Span: Poor  Recall:  Poor  Fund of Knowledge:  Fair  Language:  Fair  Akathisia:  No  Handed:  Right  AIMS (if indicated):     Assets:  Communication Skills Desire for Improvement Financial Resources/Insurance Housing Leisure Time Resilience Social Support  ADL's:  Intact  Cognition:  WNL  Sleep:  Number of Hours: 6.15     Treatment Plan Summary: Daily contact with patient to assess and evaluate symptoms and progress in treatment and Medication management   Greg [name] is a 51 year old male with history of schizoaffective disorder admitted floridly manic in spite of reasonable medication compliance.  1. Mood and psychosis. He has been maintained on a combination of oral Invega, lithium, and Depakote and agreed to take [place] on 04/22/2017, second shot 04/26/2017. We also started Clozapine titration slowly as he is oversedated. I will give ipratroprium for drooling  and start lowering oral invega.  2. Insomnia. Slept better with Restoril.    3. Hypertension. He is on metoprolol.   4. Diabetes. He is on ADA diet, Lantus and metformin, blood glucose monitoring, SSI. Input from diabetes nurse coordinator is appreciated. Living well with diabetes book ordered.  5. Smoking. Nicotine patch is available.  6. Sleep apnea. He is on CPAP.  7. Metabolic syndrome monitoring. Lipid panel and TSH are normal, hemoglobin A1c is 10.9.   8. EKG. Normal sinus rhythm, QTc 437.   9. Social. He lives with her elderly parents who no longer feel they can provide care. They will need advice on guardianship process and placement.  10. Disposition. To be established. He will likely be discharged back to his parents. He will follow up with [facility].   Mood and psychiatric symptoms stable. Blood sugars staying fairly stable. Tolerating medicine well. Patient is looking forward to discharge.   Mordecai Rasmussen, MD 04/28/2017, 3:32 PM

## 2017-04-28 NOTE — Plan of Care (Signed)
Problem: Safety: Goal: Periods of time without injury will increase Outcome: Progressing Patient is observed walking slowly and frequently in the halls.  He is wearing shoes, as encouraged, and without falls thus far this shift. We reviewed the times for his evening medications to avoid sedation while busy on the unit.  Patient was agreeable and participated in care.  Safety is maintained.  Problem: Self-Care: Goal: Ability to participate in self-care as condition permits will improve Outcome: Progressing Patient was given feedback for improvements in self care and he seemed pleased.  He continues to need prompts and minimal assistance, especially in the early am.

## 2017-04-28 NOTE — [facility] Group Notes (Signed)
LCSW Group Therapy Note  04/28/2017 2:45pm  Type of Therapy/Topic:  Group Therapy:  Balance in Life  Participation Level:  Did Not Attend  Description of Group:   This group will address the concept of balance and how it feels and looks when one is unbalanced. Patients will be encouraged to process areas in their lives that are out of balance and identify reasons for remaining unbalanced. Facilitators will guide patients in utilizing problem-solving interventions to address and correct the stressor making their life unbalanced. Understanding and applying boundaries will be explored and addressed for obtaining and maintaining a balanced life. Patients will be encouraged to explore ways to assertively make their unbalanced needs known to significant others in their lives, using other group members and facilitator for support and feedback.  Therapeutic Goals: 1. Patient will identify two or more emotions or situations they have that consume much of in their lives. 2. Patient will identify signs/triggers that life has become out of balance:  3. Patient will identify two ways to set boundaries in order to achieve balance in their lives:  4. Patient will demonstrate ability to communicate their needs through discussion and/or role plays  Summary of Patient Progress:       Therapeutic Modalities:   Cognitive Behavioral Therapy Solution-Focused Therapy Assertiveness Training  Jessalyn Hinojosa L Bently Morath, LCSW 04/28/2017 11:05 AM   

## 2017-04-28 NOTE — Progress Notes (Signed)
D: Patient is observed in bed asleep this morning.  Patient is snoring profusely and has drool on the side of his face.  He did sit up to take his medications, however, he appears very lethargic and slow.  He appears unfocused and a little confused, however, he did come out at mealtime.  He has poor hygiene with some body odor.  He is cooperative and redirectable.  He shook his head "no" when asked if he had any thoughts of self harm.  Patient is not able to attend groups due to drowsiness.   A: Continue to monitor medication management and MD orders.  Safety checks continued every 15 minutes per protocol.  Offer support and encouragement as needed. R: Patient remains isolative to room.

## 2017-04-28 NOTE — Plan of Care (Signed)
Problem: Coping: Goal: Ability to demonstrate self-control will improve Outcome: Progressing Patient has been able to demonstrate self control; his behavior has been appropriate.     

## 2017-04-29 LAB — GLUCOSE, CAPILLARY: Glucose-Capillary: 99 mg/dL (ref 65–99)

## 2017-04-29 MED ORDER — TEMAZEPAM 15 MG PO CAPS
15.0000 mg | ORAL_CAPSULE | Freq: Every day | ORAL | Status: DC
  Administered 2017-04-29 – 2017-05-07 (×9): 15 mg via ORAL
  Filled 2017-04-29 (×9): qty 1

## 2017-04-29 MED ORDER — TUBERCULIN PPD 5 UNIT/0.1ML ID SOLN
5.0000 [IU] | Freq: Once | INTRADERMAL | Status: AC
  Administered 2017-04-29: 5 [IU] via INTRADERMAL
  Filled 2017-04-29: qty 0.1

## 2017-04-29 NOTE — [facility] Group Notes (Signed)
[facility] Group Notes:  (Nursing/MHT/Case Management/Adjunct)  Date:  04/29/2017  Time:  10:37 AM  Type of Therapy:  Psychoeducational Skills  Participation Level:  Did Not Attend  Lynelle Smoke [facility] 04/29/2017, 10:37 AM

## 2017-04-29 NOTE — Progress Notes (Addendum)
[facility] MD Progress Note  04/29/2017 3:17 PM Greg [name].  MRN:  [id number]  Subjective:   History of present illness. Information was obtained from the patient, the chart, and his mother on the phone. The patient was completed by his mother for escalating symptoms of mania with psychosis. The patient became threatening to his elderly parents, refused to take some of his medications, has not been able to sleep, and has been increasingly agitated. He also states started bizarre behavior with consuming large volumes of salt. He has also been neglectful his medical problems. He stopped taking his insulin and blood pressure medication believing that God cured him. The mother reports that the patient has been only partially compliant with medications, picking and choosing what to take. On admission his Lithium level was therapeutic while Depakote level was low. The patient has been taking oral Invega but he is very skilful to cheek his medication. He was on [place] in the past with good results. It is unclear why Invega injections were stopped. According to the mother, all summer long, the patient has been standing in the throat house naked with a necklace made O pieces of concrete, greeting passersby cars, and protesting against abortion. He lately been rather delusional believing that he is God. He grew his hear over the summer but recently buzz cut some of it.   04/24/2017. [name] is too sedated after we increased Clozapine to 200 mg last night. He is also drooling profusely. He has sleep apnea and excessive drooling may be dangerous with the risk of aspiration pneumonia. He has no somatic complaints. No program participation.   04/25/2017. [name] is up and out of his room today. He seems very excited about the prospect of going home He does not realize that he is noted.[name] He wants to continue his current pain to support pro-life agenda. He has second thoughts after a while thinking  that it may not ary since he believes that Greg [name] is going to win. We decreased clozapine dose last night and we'll continue the titration slowly. He reports good sleep. She has not been participating in programming. There are no somatic complaints.  04/26/2017. [name] is no longer manic but appears unfocused and sleepy even on low dose of Clozapine. We will titrate very slowly. His hygiene is terrible. I observed him this morning sleeping. He does have sleep apnea, sleeps on his back and remains at high risk of aspiration. He refuses CPAP at night. He is mostly in his room but does come to class on occasion.   Follow-up for the 29th. Patient seen chart reviewed. Patient has no new complaints. He tells me that he is looking forward to going home. He tells me that he is no longer planning to run for president and that he understands that was part of his mania. He still does not have the best hygiene but he is not completely malodorous. He was pleasant in his interactions with me.  Patient follow-up for the 30th. Did not express any delusions. Seems a little bit sleepy but his hygiene is better. He falls asleep sometimes sitting up in the day area. Not aggressive not agitated not expressing any obvious psychosis. Vital stable.  04/29/2017. [name] looks different today. He is up and about this morning. No somnolence or slowness. To the contrary, he is talkative and engaging. It makes me think that his mania is back. He has been taking his medicines but is only able  to tolerate 150 mg of Clozapine so far due to sedation and excessive drooling. Sleep is better. Appetite is fair. There are no somatic complaints. His mother told us today that the patient will not be able to return home with his parents due to history of aggression.   Per nursing: D: Patient has brighter affect today; he is visible on the unit.  Patient is pleasant with staff.  Patient is focused on discharge and he denies any  thoughts of self harm.  No delusions noted and patient does not appear to be responding to internal stimuli.  Patient is less sedated and more active today.   A: Continue to monitor medication management and MD orders.  Safety checks completed every 15 minutes per protocol.  Offer support and encouragement as needed. R: Patient is receptive to staff; his behavior is appropriate.   Principal Problem: Schizoaffective disorder, bipolar type (HCC) Diagnosis:   Patient Active Problem List   Diagnosis Date Noted  . Schizoaffective disorder, bipolar type (HCC) [F25.0] 03/28/2016  . Acute cholecystitis with chronic cholecystitis [K81.2] 03/27/2016  . Anxiety [F41.9]   . 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 Bipolar 2 disorder (HCC) [F31.81]   . Hyponatremia [E87.1] 10/06/2015  . Essential hypertension [I10] 11/28/2014  . Dyslipidemia [E78.5] 11/28/2014  . Obesity [E66.9] 11/28/2014  . Diabetes (HCC) [E11.9] 11/28/2014  . Tobacco use disorder [F17.200] 11/28/2014   Total Time spent with patient: 30 minutes  Past Psychiatric History: long history of bipolar manic episodes, multiple hospitalizations and medication trials, poor treatment compliance.  Past Medical History:  Past Medical History:  Diagnosis Date  . Bipolar 1 disorder (HCC)   . Diabetes (HCC)   . Dyslipidemia   . HTN (hypertension)   . Obesity   . Paranoia (HCC)   . Schizo affective schizophrenia [facility])     Past Surgical History:  Procedure Laterality Date  . APPENDECTOMY  51 years old  . CHOLECYSTECTOMY N/A 03/30/2016   Procedure: LAPAROSCOPIC CHOLECYSTECTOMY;  Surgeon: Gladis Riffle, MD;  Location: [facility] ORS;  Service: General;  Laterality: N/A;  . TONSILLECTOMY  51 year old   Family History:  Family History  Problem Relation Age of Onset  . Depression Mother   . Mental illness Mother   . Depression Father   . Mental illness Father   . Depression Sister   . Mental illness Sister    Family Psychiatric   History: grandmother with schizophrenia. Social History:  History  Alcohol Use No     History  Drug Use No    Social History   Social History  . Marital status: Single    Spouse name: N/A  . Number of children: N/A  . Years of education: N/A   Social History Main Topics  . Smoking status: Current Every Day Smoker    Packs/day: 0.50    Years: 8.00    Types: Cigarettes  . Smokeless tobacco: Current User    Types: Snuff  . Alcohol use No  . Drug use: No  . Sexual activity: No   Other Topics Concern  . None   Social History Narrative  . None   Additional Social History: the patient is disabled from mental illness. He lives with his elderly parents who no longer are able to care for him.                        Sleep: Fair  Appetite:  Fair  Current Medications: Current  Facility-Administered Medications  Medication Dose Route Frequency Provider Last Rate Last Dose  . acetaminophen (TYLENOL) tablet 650 mg  650 mg Oral Q6H PRN [name], [name], MD      . alum & mag hydroxide-simeth (MAALOX/MYLANTA) 200-200-20 MG/5ML suspension 30 mL  30 mL Oral Q4H PRN [name], [name], MD      . cloZAPine (CLOZARIL) tablet 150 mg  150 mg Oral QHS Kahlea Cobert B, MD   150 mg at 04/28/17 2134  . divalproex (DEPAKOTE) DR tablet 750 mg  750 mg Oral Q12H [name], [name], MD   750 mg at 04/29/17 0808  . hydrOXYzine (ATARAX/VISTARIL) tablet 50 mg  50 mg Oral TID PRN [name], [name], MD   50 mg at 04/27/17 2250  . insulin glargine (LANTUS) injection 10 Units  10 Units Subcutaneous Daily [name], [name], MD   10 Units at 04/28/17 2136  . ipratropium (ATROVENT) 0.06 % nasal spray 2 spray  2 spray Each Nare QHS Marselino Slayton B, MD   2 spray at 04/28/17 2135  . lithium carbonate (ESKALITH) CR tablet 450 mg  450 mg Oral Q12H [name], [name], MD   450 mg at 04/29/17 0808  . magnesium hydroxide (MILK OF MAGNESIA) suspension 30 mL  30 mL Oral Daily PRN [name], [name], MD      .  metFORMIN (GLUCOPHAGE) tablet 1,000 mg  1,000 mg Oral BID WC [name], [name], MD   1,000 mg at 04/29/17 0808  . metoprolol succinate (TOPROL-XL) 24 hr tablet 50 mg  50 mg Oral Daily [name], [name], MD   50 mg at 04/29/17 0808  . nicotine (NICODERM CQ - dosed in mg/24 hours) patch 21 mg  21 mg Transdermal Daily Castle Lamons B, MD   21 mg at 04/29/17 0809  . paliperidone (INVEGA SUSTENNA) injection 234 mg  234 mg Intramuscular Q28 days Tilmon Wisehart B, MD   234 mg at 04/22/17 1701  . pneumococcal 23 valent vaccine (PNU-IMMUNE) injection 0.5 mL  0.5 mL Intramuscular Tomorrow-1000 Charlott Calvario B, MD      . temazepam (RESTORIL) capsule 30 mg  30 mg Oral QHS Vira Chaplin B, MD   30 mg at 04/27/17 2250    Lab Results:  No results found for this or any previous visit (from the past 48 hour(s)).  Blood Alcohol level:  Lab Results  Component Value Date   ETH <5 04/14/2017   ETH <5 10/06/2015    Metabolic Disorder Labs: Lab Results  Component Value Date   HGBA1C 10.9 (H) 04/17/2017   MPG 266.13 04/17/2017   MPG 154 02/09/2015   Lab Results  Component Value Date   PROLACTIN 43.3 (H) 04/17/2017   PROLACTIN 45.2 (H) 10/07/2015   Lab Results  Component Value Date   CHOL 103 04/17/2017   TRIG 110 04/17/2017   HDL 34 (L) 04/17/2017   CHOLHDL 3.0 04/17/2017   VLDL 22 04/17/2017   LDLCALC 47 04/17/2017   LDLCALC 44 10/07/2015    Physical Findings: AIMS:  , ,  ,  ,    CIWA:    COWS:     Musculoskeletal: Strength & Muscle Tone: within normal limits Gait & Station: normal Patient leans: N/A  Psychiatric Specialty Exam: Physical Exam  Nursing note and vitals reviewed. Constitutional: He appears well-developed and well-nourished.  HENT:  Head: Normocephalic and atraumatic.  Eyes: Pupils are equal, round, and reactive to light. Conjunctivae are normal.  Neck: Normal range of motion.  Cardiovascular:  Normal heart sounds.   Respiratory: Effort normal.   GI: Soft.  Musculoskeletal: Normal range of motion.  Neurological: He is alert.  Skin: Skin is warm and dry.  Psychiatric: He has a normal mood and affect. His speech is normal and behavior is normal. Thought content is not paranoid and not delusional. Cognition and memory are normal. He expresses impulsivity.    Review of Systems  Constitutional: Negative.   HENT: Negative.   Eyes: Negative.   Respiratory: Negative.   Cardiovascular: Negative.   Gastrointestinal: Negative.   Genitourinary: Negative.   Musculoskeletal: Negative.   Skin: Negative.   Neurological: Negative.   Endo/Heme/Allergies: Negative.   Psychiatric/Behavioral: The patient does not have insomnia.     Blood pressure 132/73, pulse 92, temperature 97.6 F (36.4 C), resp. rate 20, height  (1.753 m), weight 111.6 kg (246 lb), SpO2 97 %.Body mass index is 36.33 kg/m.  General Appearance: Disheveled  Eye Contact:  Good  Speech:  Slow  Volume:  Decreased  Mood:  Depressed  Affect:  Inappropriate  Thought Process:  Disorganized and Descriptions of Associations: Loose  Orientation:  Full (Time, Place, and Person)  Thought Content:  Delusions and Paranoid Ideation  Suicidal Thoughts:  No  Homicidal Thoughts:  No  Memory:  Immediate;   Fair Recent;   Fair Remote;   Fair  Judgement:  Poor  Insight:  Lacking  Psychomotor Activity:  Increased  Concentration:  Concentration: Poor and Attention Span: Poor  Recall:  Poor  Fund of Knowledge:  Fair  Language:  Fair  Akathisia:  No  Handed:  Right  AIMS (if indicated):     Assets:  Communication Skills Desire for Improvement Financial Resources/Insurance Housing Leisure Time Resilience Social Support  ADL's:  Intact  Cognition:  WNL  Sleep:  Number of Hours: 6.15     Treatment Plan Summary: Daily contact with patient to assess and evaluate symptoms and progress in treatment and Medication management   Mr. Stefanelli is a 51 year old male with history  of schizoaffective disorder admitted floridly manic in spite of reasonable medication compliance.  1. Mood and psychosis. He has been maintained on a combination of oral Invega, lithium, and Depakote. He agreed to [place] injections on 04/22/2017 and discontinued oral Invega. We started Clozapine titration slowly as he is oversedated. We gave  ipratroprium for drooling.   2. Insomnia. Slept better with Restoril.    3. Hypertension. He is on metoprolol.   4. Diabetes. He is on ADA diet, Lantus and metformin, blood glucose monitoring, SSI. Input from diabetes nurse coordinator is appreciated. Living well with diabetes book ordered.  5. Smoking. Nicotine patch is available.  6. Sleep apnea. He is on CPAP.  7. Metabolic syndrome monitoring. Lipid panel and TSH are normal, hemoglobin A1c is 10.9.   8. EKG. Normal sinus rhythm, QTc 437.   9. Social. He lives with her elderly parents who no longer feel they can provide care. Placement is necessary. I will order PPD.   10. Disposition. To be established. He will follow up with ACT team.     Kristine Linea, MD 04/29/2017, 3:17 PM

## 2017-04-29 NOTE — Progress Notes (Signed)
D: Patient has brighter affect today; he is visible on the unit.  Patient is pleasant with staff.  Patient is focused on discharge and he denies any thoughts of self harm.  No delusions noted and patient does not appear to be responding to internal stimuli.  Patient is less sedated and more active today.   A: Continue to monitor medication management and MD orders.  Safety checks completed every 15 minutes per protocol.  Offer support and encouragement as needed. R: Patient is receptive to staff; his behavior is appropriate.

## 2017-04-29 NOTE — Plan of Care (Signed)
Problem: Education: Goal: Emotional status will improve Outcome: Progressing Patient has brighter affect; he is less isolative and appears more focused.

## 2017-04-30 LAB — GLUCOSE, CAPILLARY
Glucose-Capillary: 83 mg/dL (ref 65–99)
Glucose-Capillary: 101 mg/dL — ABNORMAL HIGH (ref 65–99)

## 2017-04-30 NOTE — [facility] Group Notes (Signed)
[facility] Group Notes:  (Nursing/MHT/Case Management/Adjunct)  Date:  04/30/2017  Time:  3:17 PM  Type of Therapy:  Psychoeducational Skills  Participation Level:  Did Not Attend

## 2017-04-30 NOTE — Progress Notes (Signed)
Patient states "I slept all day.""I am not a person for a group home but I am OK going anywhere".Denies SI,HI and AVH.Compliant with medications.Patient was not awake for any groups.Appropriate with staff & peers.Appetite and energy level good.Support and encouragement given.

## 2017-04-30 NOTE — NC FL2 (Signed)
[place] MEDICAID FL2 LEVEL OF CARE SCREENING TOOL     IDENTIFICATION  Patient Name: Greg [name]. Birthdate: October 17, 1965 Sex: male Admission Date (Current Location): 04/16/2017  [place] and [place] Number:  [address] [id number] T Facility and Address:  [facility], [address], [place], [place] [address]      Provider Number: [id number]  Attending Physician Name and Address:  Shari Prows, MD  Relative Name and Phone Number:  Deran Barro, parents, [phone]     Current Level of Care: Hospital Recommended Level of Care: Family Care Home Prior Approval Number:    Date Approved/Denied:   PASRR Number:    Discharge Plan: Other (Comment) (Family Care home)    Current Diagnoses: Patient Active Problem List   Diagnosis Date Noted  . Schizoaffective disorder, bipolar type (HCC) 03/28/2016  . Acute cholecystitis with chronic cholecystitis 03/27/2016  . Anxiety   . Benign familial tremor 10/12/2015  . Bipolar 2 disorder (HCC)   . Hyponatremia 10/06/2015  . Essential hypertension 11/28/2014  . Dyslipidemia 11/28/2014  . Obesity 11/28/2014  . Diabetes (HCC) 11/28/2014  . Tobacco use disorder 11/28/2014    Orientation RESPIRATION BLADDER Height & Weight     Self, Time, Situation, Place  Normal Continent Weight: 246 lb (111.6 kg) Height:   (175.3 cm)  BEHAVIORAL SYMPTOMS/MOOD NEUROLOGICAL BOWEL NUTRITION STATUS  Verbally abusive, Dangerous to self, others or property  (na) Continent  (na)  AMBULATORY STATUS COMMUNICATION OF NEEDS Skin   Independent Verbally Normal                       Personal Care Assistance Level of Assistance   (na)           Functional Limitations Info   (na)          SPECIAL CARE FACTORS FREQUENCY   (diabetic)                    Contractures Contractures Info: Not present    Additional Factors Info   (na)               Current Medications  (04/30/2017):  This is the current hospital active medication list Current Facility-Administered Medications  Medication Dose Route Frequency Provider Last Rate Last Dose  . acetaminophen (TYLENOL) tablet 650 mg  650 mg Oral Q6H PRN [name], [name], MD      . alum & mag hydroxide-simeth (MAALOX/MYLANTA) 200-200-20 MG/5ML suspension 30 mL  30 mL Oral Q4H PRN [name], [name], MD      . cloZAPine (CLOZARIL) tablet 150 mg  150 mg Oral QHS Pucilowska, Jolanta B, MD   150 mg at 04/29/17 2216  . divalproex (DEPAKOTE) DR tablet 750 mg  750 mg Oral Q12H [name], [name], MD   750 mg at 04/30/17 7846  . hydrOXYzine (ATARAX/VISTARIL) tablet 50 mg  50 mg Oral TID PRN [name], [name], MD   50 mg at 04/27/17 2250  . insulin glargine (LANTUS) injection 10 Units  10 Units Subcutaneous Daily [name], [name], MD   10 Units at 04/29/17 2217  . ipratropium (ATROVENT) 0.06 % nasal spray 2 spray  2 spray Each Nare QHS Pucilowska, Jolanta B, MD   2 spray at 04/29/17 2217  . lithium carbonate (ESKALITH) CR tablet 450 mg  450 mg Oral Q12H [name], [name], MD   450 mg at 04/30/17 0830  . magnesium hydroxide (MILK OF MAGNESIA) suspension  30 mL  30 mL Oral Daily PRN [name], [name], MD      . metFORMIN (GLUCOPHAGE) tablet 1,000 mg  1,000 mg Oral BID WC [name], [name], MD   1,000 mg at 04/30/17 0828  . metoprolol succinate (TOPROL-XL) 24 hr tablet 50 mg  50 mg Oral Daily [name], [name], MD   50 mg at 04/30/17 4098  . nicotine (NICODERM CQ - dosed in mg/24 hours) patch 21 mg  21 mg Transdermal Daily Pucilowska, Jolanta B, MD   21 mg at 04/30/17 0830  . paliperidone (INVEGA SUSTENNA) injection 234 mg  234 mg Intramuscular Q28 days Pucilowska, Jolanta B, MD   234 mg at 04/22/17 1701  . pneumococcal 23 valent vaccine (PNU-IMMUNE) injection 0.5 mL  0.5 mL Intramuscular Tomorrow-1000 Pucilowska, Jolanta B, MD      . temazepam (RESTORIL) capsule 15 mg  15 mg Oral QHS Pucilowska, Jolanta B, MD   15 mg at 04/29/17 2216  . tuberculin  injection 5 Units  5 Units Intradermal Once Pucilowska, Jolanta B, MD   5 Units at 04/29/17 1700     Discharge Medications: Please see discharge summary for a list of discharge medications.  Relevant Imaging Results:  Relevant Lab Results:   Additional Information na  [name], LCSW

## 2017-04-30 NOTE — [facility] Group Notes (Signed)
LCSW Group Therapy Note  04/30/2017 3pm  Type of Therapy/Topic:  Group Therapy:  Emotion Regulation  Participation Level:  Did Not Attend   Description of Group:   The purpose of this group is to assist patients in learning to regulate negative emotions and experience positive emotions. Patients will be guided to discuss ways in which they have been vulnerable to their negative emotions. These vulnerabilities will be juxtaposed with experiences of positive emotions or situations, and patients will be challenged to use positive emotions to combat negative ones. Special emphasis will be placed on coping with negative emotions in conflict situations, and patients will process healthy conflict resolution skills.  Therapeutic Goals: 1. Patient will identify two positive emotions or experiences to reflect on in order to balance out negative emotions 2. Patient will label two or more emotions that they find the most difficult to experience 3. Patient will demonstrate positive conflict resolution skills through discussion and/or role plays  Summary of Patient Progress:       Therapeutic Modalities:   Cognitive Behavioral Therapy Feelings Identification Dialectical Behavioral Therapy   Glennon Mac, LCSW 04/30/2017 4:53 PM

## 2017-04-30 NOTE — Progress Notes (Signed)
[facility] MD Progress Note  04/30/2017 3:56 PM Greg [name].  MRN:  [id number]  Subjective:   History of present illness. Information was obtained from the patient, the chart, and his mother on the phone. The patient was completed by his mother for escalating symptoms of mania with psychosis. The patient became threatening to his elderly parents, refused to take some of his medications, has not been able to sleep, and has been increasingly agitated. He also states started bizarre behavior with consuming large volumes of salt. He has also been neglectful his medical problems. He stopped taking his insulin and blood pressure medication believing that God cured him. The mother reports that the patient has been only partially compliant with medications, picking and choosing what to take. On admission his Lithium level was therapeutic while Depakote level was low. The patient has been taking oral Invega but he is very skilful to cheek his medication. He was on [place] in the past with good results. It is unclear why Invega injections were stopped. According to the mother, all summer long, the patient has been standing in the throat house naked with a necklace made O pieces of concrete, greeting passersby cars, and protesting against abortion. He lately been rather delusional believing that he is God. He grew his hear over the summer but recently buzz cut some of it.   04/24/2017. Greg [name] is too sedated after we increased Clozapine to 200 mg last night. He is also drooling profusely. He has sleep apnea and excessive drooling may be dangerous with the risk of aspiration pneumonia. He has no somatic complaints. No program participation.   04/25/2017. Greg [name] is up and out of his room today. He seems very excited about the prospect of going home He does not realize that he is noted.[name] He wants to continue his current pain to support pro-life agenda. He has second thoughts after a while thinking  that it may not ary since he believes that Greg [name] is going to win. We decreased clozapine dose last night and we'll continue the titration slowly. He reports good sleep. She has not been participating in programming. There are no somatic complaints.  04/26/2017. Greg [name] is no longer manic but appears unfocused and sleepy even on low dose of Clozapine. We will titrate very slowly. His hygiene is terrible. I observed him this morning sleeping. He does have sleep apnea, sleeps on his back and remains at high risk of aspiration. He refuses CPAP at night. He is mostly in his room but does come to class on occasion.   Follow-up for the 29th. Patient seen chart reviewed. Patient has no new complaints. He tells me that he is looking forward to going home. He tells me that he is no longer planning to run for president and that he understands that was part of his mania. He still does not have the best hygiene but he is not completely malodorous. He was pleasant in his interactions with me.  Patient follow-up for the 30th. Did not express any delusions. Seems a little bit sleepy but his hygiene is better. He falls asleep sometimes sitting up in the day area. Not aggressive not agitated not expressing any obvious psychosis. Vital stable.  04/29/2017. Greg [name] looks different today. He is up and about this morning. No somnolence or slowness. To the contrary, he is talkative and engaging. It makes me think that his mania is back. He has been taking his medicines but is only able  to tolerate 150 mg of Clozapine so far due to sedation and excessive drooling. Sleep is better. Appetite is fair. There are no somatic complaints. His mother told us today that the patient will not be able to return home with his parents due to history of aggression.   04/30/2017. Greg [name] is rather depressed goday. We had to remaind him that he will not be returning home with his parents. Rather, he will be placed in a group  home. PPD placed. Awaiting group home visit. He needed a klot of support today. He still sleep a lot during the day. I will try to increase Clozapine to 200 mg tomorrow.   Per nursing: Goal: Emotional status will improve Outcome: Progressing Patient has brighter affect; he is less isolative and appears more focused.  Principal Problem: Schizoaffective disorder, bipolar type (HCC) Diagnosis:   Patient Active Problem List   Diagnosis Date Noted  . Schizoaffective disorder, bipolar type (HCC) [F25.0] 03/28/2016  . Acute cholecystitis with chronic cholecystitis [K81.2] 03/27/2016  . Anxiety [F41.9]   . 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 Bipolar 2 disorder (HCC) [F31.81]   . Hyponatremia [E87.1] 10/06/2015  . Essential hypertension [I10] 11/28/2014  . Dyslipidemia [E78.5] 11/28/2014  . Obesity [E66.9] 11/28/2014  . Diabetes (HCC) [E11.9] 11/28/2014  . Tobacco use disorder [F17.200] 11/28/2014   Total Time spent with patient: 30 minutes  Past Psychiatric History: long history of bipolar manic episodes, multiple hospitalizations and medication trials, poor treatment compliance.  Past Medical History:  Past Medical History:  Diagnosis Date  . Bipolar 1 disorder (HCC)   . Diabetes (HCC)   . Dyslipidemia   . HTN (hypertension)   . Obesity   . Paranoia (HCC)   . Schizo affective schizophrenia [facility])     Past Surgical History:  Procedure Laterality Date  . APPENDECTOMY  51 years old  . CHOLECYSTECTOMY N/A 03/30/2016   Procedure: LAPAROSCOPIC CHOLECYSTECTOMY;  Surgeon: Gladis Riffle, MD;  Location: [facility] ORS;  Service: General;  Laterality: N/A;  . TONSILLECTOMY  51 year old   Family History:  Family History  Problem Relation Age of Onset  . Depression Mother   . Mental illness Mother   . Depression Father   . Mental illness Father   . Depression Sister   . Mental illness Sister    Family Psychiatric  History: grandmother with schizophrenia. Social History:   History  Alcohol Use No     History  Drug Use No    Social History   Social History  . Marital status: Single    Spouse name: N/A  . Number of children: N/A  . Years of education: N/A   Social History Main Topics  . Smoking status: Current Every Day Smoker    Packs/day: 0.50    Years: 8.00    Types: Cigarettes  . Smokeless tobacco: Current User    Types: Snuff  . Alcohol use No  . Drug use: No  . Sexual activity: No   Other Topics Concern  . None   Social History Narrative  . None   Additional Social History: the patient is disabled from mental illness. He lives with his elderly parents who no longer are able to care for him.                        Sleep: Fair  Appetite:  Fair  Current Medications: Current Facility-Administered Medications  Medication Dose Route Frequency Provider Last Rate Last  Dose  . acetaminophen (TYLENOL) tablet 650 mg  650 mg Oral Q6H PRN [name], [name], MD      . alum & mag hydroxide-simeth (MAALOX/MYLANTA) 200-200-20 MG/5ML suspension 30 mL  30 mL Oral Q4H PRN [name], [name], MD      . cloZAPine (CLOZARIL) tablet 150 mg  150 mg Oral QHS Neka Bise B, MD   150 mg at 04/29/17 2216  . divalproex (DEPAKOTE) DR tablet 750 mg  750 mg Oral Q12H [name], [name], MD   750 mg at 04/30/17 1610  . hydrOXYzine (ATARAX/VISTARIL) tablet 50 mg  50 mg Oral TID PRN [name], [name], MD   50 mg at 04/27/17 2250  . insulin glargine (LANTUS) injection 10 Units  10 Units Subcutaneous Daily [name], [name], MD   10 Units at 04/29/17 2217  . ipratropium (ATROVENT) 0.06 % nasal spray 2 spray  2 spray Each Nare QHS Ravin Bendall B, MD   2 spray at 04/29/17 2217  . lithium carbonate (ESKALITH) CR tablet 450 mg  450 mg Oral Q12H [name], [name], MD   450 mg at 04/30/17 0830  . magnesium hydroxide (MILK OF MAGNESIA) suspension 30 mL  30 mL Oral Daily PRN [name], [name], MD      . metFORMIN (GLUCOPHAGE) tablet 1,000 mg  1,000 mg Oral BID WC  [name], [name], MD   1,000 mg at 04/30/17 0828  . metoprolol succinate (TOPROL-XL) 24 hr tablet 50 mg  50 mg Oral Daily [name], [name], MD   50 mg at 04/30/17 9604  . nicotine (NICODERM CQ - dosed in mg/24 hours) patch 21 mg  21 mg Transdermal Daily Edy Belt B, MD   21 mg at 04/30/17 0830  . paliperidone (INVEGA SUSTENNA) injection 234 mg  234 mg Intramuscular Q28 days Mourad Cwikla B, MD   234 mg at 04/22/17 1701  . pneumococcal 23 valent vaccine (PNU-IMMUNE) injection 0.5 mL  0.5 mL Intramuscular Tomorrow-1000 Brihany Butch B, MD      . temazepam (RESTORIL) capsule 15 mg  15 mg Oral QHS Kaisyn Reinhold B, MD   15 mg at 04/29/17 2216  . tuberculin injection 5 Units  5 Units Intradermal Once Secilia Apps B, MD   5 Units at 04/29/17 1700    Lab Results:  Results for orders placed or performed during the hospital encounter of 04/16/17 (from the past 48 hour(s))  Glucose, capillary     Status: None   Collection Time: 04/29/17 10:14 PM  Result Value Ref Range   Glucose-Capillary 99 65 - 99 mg/dL  Glucose, capillary     Status: None   Collection Time: 04/30/17  6:46 AM  Result Value Ref Range   Glucose-Capillary 83 65 - 99 mg/dL    Blood Alcohol level:  Lab Results  Component Value Date   ETH <5 04/14/2017   ETH <5 10/06/2015    Metabolic Disorder Labs: Lab Results  Component Value Date   HGBA1C 10.9 (H) 04/17/2017   MPG 266.13 04/17/2017   MPG 154 02/09/2015   Lab Results  Component Value Date   PROLACTIN 43.3 (H) 04/17/2017   PROLACTIN 45.2 (H) 10/07/2015   Lab Results  Component Value Date   CHOL 103 04/17/2017   TRIG 110 04/17/2017   HDL 34 (L) 04/17/2017   CHOLHDL 3.0 04/17/2017   VLDL 22 04/17/2017   LDLCALC 47 04/17/2017   LDLCALC 44 10/07/2015    Physical Findings: AIMS:  , ,  ,  ,  CIWA:    COWS:     Musculoskeletal: Strength & Muscle Tone: within normal limits Gait & Station: normal Patient leans: N/A  Psychiatric  Specialty Exam: Physical Exam  Nursing note and vitals reviewed. Constitutional: He appears well-developed and well-nourished.  HENT:  Head: Normocephalic and atraumatic.  Eyes: Pupils are equal, round, and reactive to light. Conjunctivae are normal.  Neck: Normal range of motion.  Cardiovascular: Normal heart sounds.   Respiratory: Effort normal.  GI: Soft.  Musculoskeletal: Normal range of motion.  Neurological: He is alert.  Skin: Skin is warm and dry.  Psychiatric: He has a normal mood and affect. His speech is normal and behavior is normal. Thought content is not paranoid and not delusional. Cognition and memory are normal. He expresses impulsivity.    Review of Systems  Constitutional: Negative.   HENT: Negative.   Eyes: Negative.   Respiratory: Negative.   Cardiovascular: Negative.   Gastrointestinal: Negative.   Genitourinary: Negative.   Musculoskeletal: Negative.   Skin: Negative.   Neurological: Negative.   Endo/Heme/Allergies: Negative.   Psychiatric/Behavioral: The patient does not have insomnia.     Blood pressure 125/63, pulse 100, temperature 98.7 F (37.1 C), resp. rate 20, height  (1.753 m), weight 111.6 kg (246 lb), SpO2 97 %.Body mass index is 36.33 kg/m.  General Appearance: Disheveled  Eye Contact:  Good  Speech:  Slow  Volume:  Decreased  Mood:  Depressed  Affect:  Inappropriate  Thought Process:  Disorganized and Descriptions of Associations: Loose  Orientation:  Full (Time, Place, and Person)  Thought Content:  Delusions and Paranoid Ideation  Suicidal Thoughts:  No  Homicidal Thoughts:  No  Memory:  Immediate;   Fair Recent;   Fair Remote;   Fair  Judgement:  Poor  Insight:  Lacking  Psychomotor Activity:  Increased  Concentration:  Concentration: Poor and Attention Span: Poor  Recall:  Poor  Fund of Knowledge:  Fair  Language:  Fair  Akathisia:  No  Handed:  Right  AIMS (if indicated):     Assets:  Communication Skills Desire  for Improvement Financial Resources/Insurance Housing Leisure Time Resilience Social Support  ADL's:  Intact  Cognition:  WNL  Sleep:  Number of Hours: 7     Treatment Plan Summary: Daily contact with patient to assess and evaluate symptoms and progress in treatment and Medication management   Greg [name] is a 51 year old male with history of schizoaffective disorder admitted floridly manic in spite of reasonable medication compliance.  1. Mood and psychosis. He has been maintained on a combination of oral Invega, lithium, and Depakote. He agreed to [place] injections on 04/22/2017 and discontinued oral Invega. We started Clozapine titration slowly as he is oversedated. We gave  ipratroprium for drooling. Clozapine 200 mg on 05/01/2017.   2. Insomnia. Slept better with Restoril.    3. Hypertension. He is on metoprolol.   4. Diabetes. He is on ADA diet, Lantus and metformin, blood glucose monitoring, SSI. Input from diabetes nurse coordinator is appreciated. Living well with diabetes book ordered.  5. Smoking. Nicotine patch is available.  6. Sleep apnea. He is on CPAP.  7. Metabolic syndrome monitoring. Lipid panel and TSH are normal, hemoglobin A1c is 10.9.   8. EKG. Normal sinus rhythm, QTc 437.   9. Social. He lives with her elderly parents who no longer feel they can provide care. Placement is necessary. I will order PPD.   10. Disposition. To be established. He will follow  up with ACT team.     Kristine Linea, MD 04/30/2017, 3:56 PM

## 2017-04-30 NOTE — Progress Notes (Signed)
D: Pt denies SI/HI/AVH. Pt is pleasant and cooperative, patient's thoughts are organized no bizarre behavior noted. Pt appears less anxious, and he is interacting with peers and staff appropriately. Patient has more insight into his treatment plan.  A: Pt was offered support and encouragement. Pt was given scheduled medications. Pt was encouraged to attend groups. Q 15 minute checks were done for safety.  R:Pt attends groups and interacts well with peers and staff. Pt is taking medication. Pt has no complaints.Pt receptive to treatment and safety maintained on unit.

## 2017-04-30 NOTE — Plan of Care (Signed)
Problem: Coping: Goal: Ability to cope will improve Outcome: Progressing Patients affect is brighter and visible in the milieu.  Problem: Nutritional: Goal: Ability to achieve adequate nutritional intake will improve Outcome: Progressing Appetite and energy level good.  Problem: Safety: Goal: Ability to redirect hostility and anger into socially appropriate behaviors will improve Outcome: Progressing Patient appropriate with staff & peers.Safety maintained in the unit.

## 2017-05-01 MED ORDER — CLOZAPINE 100 MG PO TABS
200.0000 mg | ORAL_TABLET | Freq: Every day | ORAL | Status: DC
  Administered 2017-05-01 – 2017-05-08 (×8): 200 mg via ORAL
  Filled 2017-05-01 (×8): qty 2

## 2017-05-01 NOTE — Tx Team (Signed)
Interdisciplinary Treatment and Diagnostic Plan Update  05/01/2017 Time of Session: 10:30am Greg [name]. MRN: [id number]  Principal Diagnosis: Schizoaffective disorder, bipolar type (HCC)  Secondary Diagnoses: Principal Problem:   Schizoaffective disorder, bipolar type (HCC) Active Problems:   Essential hypertension   Obesity   Diabetes (HCC)   Tobacco use disorder   Current Medications:  Current Facility-Administered Medications  Medication Dose Route Frequency Provider Last Rate Last Dose  . acetaminophen (TYLENOL) tablet 650 mg  650 mg Oral Q6H PRN [name], [name], MD      . alum & mag hydroxide-simeth (MAALOX/MYLANTA) 200-200-20 MG/5ML suspension 30 mL  30 mL Oral Q4H PRN [name], [name], MD      . cloZAPine (CLOZARIL) tablet 200 mg  200 mg Oral QHS Pucilowska, Jolanta B, MD      . divalproex (DEPAKOTE) DR tablet 750 mg  750 mg Oral Q12H [name], [name], MD   750 mg at 05/01/17 0748  . hydrOXYzine (ATARAX/VISTARIL) tablet 50 mg  50 mg Oral TID PRN [name], [name], MD   50 mg at 04/27/17 2250  . insulin glargine (LANTUS) injection 10 Units  10 Units Subcutaneous Daily [name], [name], MD   10 Units at 04/30/17 2144  . ipratropium (ATROVENT) 0.06 % nasal spray 2 spray  2 spray Each Nare QHS Pucilowska, Jolanta B, MD   2 spray at 04/30/17 2204  . lithium carbonate (ESKALITH) CR tablet 450 mg  450 mg Oral Q12H [name], [name], MD   450 mg at 05/01/17 0748  . magnesium hydroxide (MILK OF MAGNESIA) suspension 30 mL  30 mL Oral Daily PRN [name], [name], MD      . metFORMIN (GLUCOPHAGE) tablet 1,000 mg  1,000 mg Oral BID WC [name], [name], MD   1,000 mg at 05/01/17 1650  . metoprolol succinate (TOPROL-XL) 24 hr tablet 50 mg  50 mg Oral Daily [name], [name], MD   50 mg at 05/01/17 0748  . nicotine (NICODERM CQ - dosed in mg/24 hours) patch 21 mg  21 mg Transdermal Daily Pucilowska, Jolanta B, MD   21 mg at 05/01/17 0748  . paliperidone (INVEGA SUSTENNA) injection 234 mg  234 mg  Intramuscular Q28 days Pucilowska, Jolanta B, MD   234 mg at 04/22/17 1701  . pneumococcal 23 valent vaccine (PNU-IMMUNE) injection 0.5 mL  0.5 mL Intramuscular Tomorrow-1000 Pucilowska, Jolanta B, MD      . temazepam (RESTORIL) capsule 15 mg  15 mg Oral QHS Pucilowska, Jolanta B, MD   15 mg at 04/30/17 2205   PTA Medications: Prescriptions Prior to Admission  Medication Sig Dispense Refill Last Dose  . amantadine (SYMMETREL) 100 MG capsule Take 100 mg by mouth 2 (two) times daily.     . benztropine (COGENTIN) 2 MG tablet Take 1 tablet by mouth daily.     . divalproex (DEPAKOTE) 250 MG DR tablet Take 3 tablets (750 mg total) by mouth every 12 (twelve) hours. 180 tablet 0 Taking  . insulin glargine (LANTUS) 100 UNIT/ML injection Inject 0.1 mLs (10 Units total) into the skin daily. 10 mL 0 Taking  . lithium carbonate (ESKALITH) 450 MG CR tablet Take 450 mg by mouth 2 (two) times daily.     [name] lithium carbonate 300 MG capsule Take 300 mg by mouth 3 (three) times daily.     . metFORMIN (GLUCOPHAGE) 1000 MG tablet Take 1 tablet (1,000 mg total) by mouth 2 (two) times daily with a meal. 60 tablet  0 Taking  . metoprolol succinate (TOPROL-XL) 50 MG 24 hr tablet Take 1 tablet (50 mg total) by mouth daily. Take with or immediately following a meal. 30 tablet 0 Taking  . propranolol (INDERAL) 20 MG tablet Take 20 mg by mouth 2 (two) times daily.      Patient Stressors: Financial difficulties Health problems Medication change or noncompliance  Patient Strengths: Ability for insight Capable of independent living Supportive family/friends  Treatment Modalities: Medication Management, Group therapy, Case management,  1 to 1 session with clinician, Psychoeducation, Recreational therapy.   Physician Treatment Plan for Primary Diagnosis: Schizoaffective disorder, bipolar type (HCC) Long Term Goal(s): Improvement in symptoms so as ready for discharge Improvement in symptoms so as ready for discharge    Short Term Goals: Ability to identify changes in lifestyle to reduce recurrence of condition will improve Ability to verbalize feelings will improve Ability to disclose and discuss suicidal ideas Ability to demonstrate self-control will improve Ability to identify and develop effective coping behaviors will improve Compliance with prescribed medications will improve Ability to identify triggers associated with substance abuse/mental health issues will improve Compliance with prescribed medications will improve  Medication Management: Evaluate patient's response, side effects, and tolerance of medication regimen.  Therapeutic Interventions: 1 to 1 sessions, Unit Group sessions and Medication administration.  Evaluation of Outcomes: Progressing  Physician Treatment Plan for Secondary Diagnosis: Principal Problem:   Schizoaffective disorder, bipolar type (HCC) Active Problems:   Essential hypertension   Obesity   Diabetes (HCC)   Tobacco use disorder  Long Term Goal(s): Improvement in symptoms so as ready for discharge Improvement in symptoms so as ready for discharge   Short Term Goals: Ability to identify changes in lifestyle to reduce recurrence of condition will improve Ability to verbalize feelings will improve Ability to disclose and discuss suicidal ideas Ability to demonstrate self-control will improve Ability to identify and develop effective coping behaviors will improve Compliance with prescribed medications will improve Ability to identify triggers associated with substance abuse/mental health issues will improve Compliance with prescribed medications will improve     Medication Management: Evaluate patient's response, side effects, and tolerance of medication regimen.  Therapeutic Interventions: 1 to 1 sessions, Unit Group sessions and Medication administration.  Evaluation of Outcomes: Progressing   RN Treatment Plan for Primary Diagnosis:  Schizoaffective disorder, bipolar type (HCC) Long Term Goal(s): Knowledge of disease and therapeutic regimen to maintain health will improve  Short Term Goals: Ability to verbalize frustration and anger appropriately will improve, Ability to participate in decision making will improve and Ability to identify and develop effective coping behaviors will improve  Medication Management: RN will administer medications as ordered by provider, will assess and evaluate patient's response and provide education to patient for prescribed medication. RN will report any adverse and/or side effects to prescribing provider.  Therapeutic Interventions: 1 on 1 counseling sessions, Psychoeducation, Medication administration, Evaluate responses to treatment, Monitor vital signs and CBGs as ordered, Perform/monitor CIWA, COWS, AIMS and Fall Risk screenings as ordered, Perform wound care treatments as ordered.  Evaluation of Outcomes: Not Progressing   LCSW Treatment Plan for Primary Diagnosis: Schizoaffective disorder, bipolar type (HCC) Long Term Goal(s): Safe transition to appropriate next level of care at discharge, Engage patient in therapeutic group addressing interpersonal concerns.  Short Term Goals: Engage patient in aftercare planning with referrals and resources, Increase ability to appropriately verbalize feelings, Increase emotional regulation and Identify triggers associated with mental health/substance abuse issues  Therapeutic Interventions: Assess for all discharge  needs, 1 to 1 time with Child psychotherapist, Explore available resources and support systems, Assess for adequacy in community support network, Educate family and significant other(s) on suicide prevention, Complete Psychosocial Assessment, Interpersonal group therapy.  Evaluation of Outcomes: Not Progressing   Progress in Treatment: Attending groups: No Participating in groups: No Taking medication as prescribed:  Yes. Toleration medication: Yes. Family/Significant other contact made: Yes, individual(s) contacted:  mother Patient understands diagnosis: Yes. Discussing patient identified problems/goals with staff: Yes. Medical problems stabilized or resolved: Yes. Denies suicidal/homicidal ideation: Yes. Issues/concerns per patient self-inventory: No. Other:    New problem(s) identified: No, Describe:     New Short Term/Long Term Goal(s):  Discharge Plan or Barriers: CSW assessing for appropriate plan.  Reason for Continuation of Hospitalization: Mania Medication stabilization  Estimated Length of Stay: 5-7days   Attendees: Patient: 05/01/2017 5:41 PM  Physician: Kristine Linea 05/01/2017 5:41 PM  Nursing: Hulan Amato, RN 05/01/2017 5:41 PM  RN Care Manager: 05/01/2017 5:41 PM  Social Worker: Jake Shark, LCSW 05/01/2017 5:41 PM  Recreational Therapist:  05/01/2017 5:41 PM  Other:  05/01/2017 5:41 PM  Other:  05/01/2017 5:41 PM  Other: 05/01/2017 5:41 PM    Scribe for Treatment Team: Glennon Mac, LCSW 05/01/2017 5:41 PM

## 2017-05-01 NOTE — [facility] Group Notes (Signed)
[facility] Group Notes:  (Nursing/MHT/Case Management/Adjunct)  Date:  05/01/2017  Time:  5:01 PM  Type of Therapy:  Psychoeducational Skills  Participation Level:  Did Not Attend  Brody Bonneau Travis Tavian Callander 05/01/2017, 5:01 PM 

## 2017-05-01 NOTE — Plan of Care (Signed)
Problem: Activity: Goal: Will verbalize the importance of balancing activity with adequate rest periods Outcome: Not Progressing Patient is observed sleeping for the majority of the day with brief periods of being present in the milieu.  Problem: Education: Goal: Will be free of psychotic symptoms Outcome: Progressing Patient's thought process is progressing. Goal: Knowledge of the prescribed therapeutic regimen will improve Outcome: Progressing Patient is knowledgeable of the prescribed regimen.  Problem: Coping: Goal: Ability to verbalize feelings will improve Outcome: Progressing Patient has the ability to verbalize his needs to staff.  Problem: Nutritional: Goal: Ability to achieve adequate nutritional intake will improve Outcome: Progressing Patient is compliant with his diet.

## 2017-05-01 NOTE — Progress Notes (Signed)
D: Pt denies SI/HI/AVH, affect is flat but brightens upon approach. Pt is pleasant and cooperative. Pt appears less anxious and he is interacting with peers and staff appropriately.  A: Pt was offered support and encouragement. Pt was given scheduled medications. Pt was encouraged to attend groups. Q 15 minute checks were done for safety.  R:Pt attends groups and interacts well with peers and staff. Pt is taking medication. Pt has no complaints.Pt receptive to treatment and safety maintained on unit.

## 2017-05-01 NOTE — Progress Notes (Signed)
[facility] MD Progress Note  05/01/2017 3:05 PM Juniper Elsie Ra.  MRN:  [id number]  Subjective:   History of present illness. Information was obtained from the patient, the chart, and his mother on the phone. The patient was completed by his mother for escalating symptoms of mania with psychosis. The patient became threatening to his elderly parents, refused to take some of his medications, has not been able to sleep, and has been increasingly agitated. He also states started bizarre behavior with consuming large volumes of salt. He has also been neglectful his medical problems. He stopped taking his insulin and blood pressure medication believing that God cured him. The mother reports that the patient has been only partially compliant with medications, picking and choosing what to take. On admission his Lithium level was therapeutic while Depakote level was low. The patient has been taking oral Invega but he is very skilful to cheek his medication. He was on [place] in the past with good results. It is unclear why Invega injections were stopped. According to the mother, all summer long, the patient has been standing in the throat house naked with a necklace made O pieces of concrete, greeting passersby cars, and protesting against abortion. He lately been rather delusional believing that he is God. He grew his hear over the summer but recently buzz cut some of it.   04/24/2017. Mr. Raeanne Barry is too sedated after we increased Clozapine to 200 mg last night. He is also drooling profusely. He has sleep apnea and excessive drooling may be dangerous with the risk of aspiration pneumonia. He has no somatic complaints. No program participation.   04/25/2017. Mr. Cedillos is up and out of his room today. He seems very excited about the prospect of going home He does not realize that he is noted.[name] He wants to continue his current pain to support pro-life agenda. He has second thoughts after a while thinking  that it may not ary since he believes that Garnet Koyanagi is going to win. We decreased clozapine dose last night and we'll continue the titration slowly. He reports good sleep. She has not been participating in programming. There are no somatic complaints.  04/26/2017. Mr. Heroux is no longer manic but appears unfocused and sleepy even on low dose of Clozapine. We will titrate very slowly. His hygiene is terrible. I observed him this morning sleeping. He does have sleep apnea, sleeps on his back and remains at high risk of aspiration. He refuses CPAP at night. He is mostly in his room but does come to class on occasion.   Follow-up for the 29th. Patient seen chart reviewed. Patient has no new complaints. He tells me that he is looking forward to going home. He tells me that he is no longer planning to run for president and that he understands that was part of his mania. He still does not have the best hygiene but he is not completely malodorous. He was pleasant in his interactions with me.  Patient follow-up for the 30th. Did not express any delusions. Seems a little bit sleepy but his hygiene is better. He falls asleep sometimes sitting up in the day area. Not aggressive not agitated not expressing any obvious psychosis. Vital stable.  04/29/2017. Mr. Lusty looks different today. He is up and about this morning. No somnolence or slowness. To the contrary, he is talkative and engaging. It makes me think that his mania is back. He has been taking his medicines but is only able  to tolerate 150 mg of Clozapine so far due to sedation and excessive drooling. Sleep is better. Appetite is fair. There are no somatic complaints. His mother told us today that the patient will not be able to return home with his parents due to history of aggression.   04/30/2017. Mr. Meschke is rather depressed goday. We had to remaind him that he will not be returning home with his parents. Rather, he will be placed in a group  home. PPD placed. Awaiting group home visit. He needed a klot of support today. He still sleep a lot during the day. I will try to increase Clozapine to 200 mg tomorrow.   05/01/2017. Mr. Sprung feels better today. He accepted the fact that he is going to a group home. He was in a group home in the past. It did not go well, he believes, due to paranoia. He no longer feels paranoid, maybe a little, and hopes to do well. He slept all day yesterday. Today he is in bed but not asleep. I am very reluctant to increase Clozapine. He has no somatic complaints. Hygiene is much better. Still not engageing or participate in programming. We expected group home representative to be here today but so far they did not show up. The PASSR assessor did come by.   Per nursing: Patient states "I slept all day.""I am not a person for a group home but I am OK going anywhere".Denies SI,HI and AVH.Compliant with medications.Patient was not awake for any groups.Appropriate with staff & peers.Appetite and energy level good.Support and encouragement given.  Principal Problem: Schizoaffective disorder, bipolar type (HCC) Diagnosis:   Patient Active Problem List   Diagnosis Date Noted  . Schizoaffective disorder, bipolar type (HCC) [F25.0] 03/28/2016  . Acute cholecystitis with chronic cholecystitis [K81.2] 03/27/2016  . Anxiety [F41.9]   . 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 Bipolar 2 disorder (HCC) [F31.81]   . Hyponatremia [E87.1] 10/06/2015  . Essential hypertension [I10] 11/28/2014  . Dyslipidemia [E78.5] 11/28/2014  . Obesity [E66.9] 11/28/2014  . Diabetes (HCC) [E11.9] 11/28/2014  . Tobacco use disorder [F17.200] 11/28/2014   Total Time spent with patient: 30 minutes  Past Psychiatric History: long history of bipolar manic episodes, multiple hospitalizations and medication trials, poor treatment compliance.  Past Medical History:  Past Medical History:  Diagnosis Date  . Bipolar 1 disorder (HCC)    . Diabetes (HCC)   . Dyslipidemia   . HTN (hypertension)   . Obesity   . Paranoia (HCC)   . Schizo affective schizophrenia [facility])     Past Surgical History:  Procedure Laterality Date  . APPENDECTOMY  51 years old  . CHOLECYSTECTOMY N/A 03/30/2016   Procedure: LAPAROSCOPIC CHOLECYSTECTOMY;  Surgeon: Gladis Riffle, MD;  Location: [facility] ORS;  Service: General;  Laterality: N/A;  . TONSILLECTOMY  51 year old   Family History:  Family History  Problem Relation Age of Onset  . Depression Mother   . Mental illness Mother   . Depression Father   . Mental illness Father   . Depression Sister   . Mental illness Sister    Family Psychiatric  History: grandmother with schizophrenia. Social History:  History  Alcohol Use No     History  Drug Use No    Social History   Social History  . Marital status: Single    Spouse name: N/A  . Number of children: N/A  . Years of education: N/A   Social History Main Topics  .  Smoking status: Current Every Day Smoker    Packs/day: 0.50    Years: 8.00    Types: Cigarettes  . Smokeless tobacco: Current User    Types: Snuff  . Alcohol use No  . Drug use: No  . Sexual activity: No   Other Topics Concern  . None   Social History Narrative  . None   Additional Social History: the patient is disabled from mental illness. He lives with his elderly parents who no longer are able to care for him.                        Sleep: Fair  Appetite:  Fair  Current Medications: Current Facility-Administered Medications  Medication Dose Route Frequency Provider Last Rate Last Dose  . acetaminophen (TYLENOL) tablet 650 mg  650 mg Oral Q6H PRN [name], [name], MD      . alum & mag hydroxide-simeth (MAALOX/MYLANTA) 200-200-20 MG/5ML suspension 30 mL  30 mL Oral Q4H PRN [name], [name], MD      . cloZAPine (CLOZARIL) tablet 150 mg  150 mg Oral QHS Teyanna Thielman B, MD   150 mg at 04/30/17 2205  . divalproex (DEPAKOTE) DR tablet  750 mg  750 mg Oral Q12H [name], [name], MD   750 mg at 05/01/17 0748  . hydrOXYzine (ATARAX/VISTARIL) tablet 50 mg  50 mg Oral TID PRN [name], [name], MD   50 mg at 04/27/17 2250  . insulin glargine (LANTUS) injection 10 Units  10 Units Subcutaneous Daily [name], [name], MD   10 Units at 04/30/17 2144  . ipratropium (ATROVENT) 0.06 % nasal spray 2 spray  2 spray Each Nare QHS Yobany Vroom B, MD   2 spray at 04/30/17 2204  . lithium carbonate (ESKALITH) CR tablet 450 mg  450 mg Oral Q12H [name], [name], MD   450 mg at 05/01/17 0748  . magnesium hydroxide (MILK OF MAGNESIA) suspension 30 mL  30 mL Oral Daily PRN [name], [name], MD      . metFORMIN (GLUCOPHAGE) tablet 1,000 mg  1,000 mg Oral BID WC [name], [name], MD   1,000 mg at 05/01/17 0748  . metoprolol succinate (TOPROL-XL) 24 hr tablet 50 mg  50 mg Oral Daily [name], [name], MD   50 mg at 05/01/17 0748  . nicotine (NICODERM CQ - dosed in mg/24 hours) patch 21 mg  21 mg Transdermal Daily Ruta Capece B, MD   21 mg at 05/01/17 0748  . paliperidone (INVEGA SUSTENNA) injection 234 mg  234 mg Intramuscular Q28 days Nikeia Henkes B, MD   234 mg at 04/22/17 1701  . pneumococcal 23 valent vaccine (PNU-IMMUNE) injection 0.5 mL  0.5 mL Intramuscular Tomorrow-1000 Naelani Lafrance B, MD      . temazepam (RESTORIL) capsule 15 mg  15 mg Oral QHS Christabelle Hanzlik B, MD   15 mg at 04/30/17 2205  . tuberculin injection 5 Units  5 Units Intradermal Once Bartt Gonzaga B, MD   5 Units at 04/29/17 1700    Lab Results:  Results for orders placed or performed during the hospital encounter of 04/16/17 (from the past 48 hour(s))  Glucose, capillary     Status: None   Collection Time: 04/29/17 10:14 PM  Result Value Ref Range   Glucose-Capillary 99 65 - 99 mg/dL  Glucose, capillary     Status: None   Collection Time: 04/30/17  6:46 AM  Result Value Ref Range  Glucose-Capillary 83 65 - 99 mg/dL  Glucose, capillary      Status: Abnormal   Collection Time: 04/30/17  8:15 PM  Result Value Ref Range   Glucose-Capillary 101 (H) 65 - 99 mg/dL   Comment 1 Notify RN     Blood Alcohol level:  Lab Results  Component Value Date   ETH <5 04/14/2017   ETH <5 10/06/2015    Metabolic Disorder Labs: Lab Results  Component Value Date   HGBA1C 10.9 (H) 04/17/2017   MPG 266.13 04/17/2017   MPG 154 02/09/2015   Lab Results  Component Value Date   PROLACTIN 43.3 (H) 04/17/2017   PROLACTIN 45.2 (H) 10/07/2015   Lab Results  Component Value Date   CHOL 103 04/17/2017   TRIG 110 04/17/2017   HDL 34 (L) 04/17/2017   CHOLHDL 3.0 04/17/2017   VLDL 22 04/17/2017   LDLCALC 47 04/17/2017   LDLCALC 44 10/07/2015    Physical Findings: AIMS:  , ,  ,  ,    CIWA:    COWS:     Musculoskeletal: Strength & Muscle Tone: within normal limits Gait & Station: normal Patient leans: N/A  Psychiatric Specialty Exam: Physical Exam  Nursing note and vitals reviewed. Constitutional: He appears well-developed and well-nourished.  HENT:  Head: Normocephalic and atraumatic.  Eyes: Pupils are equal, round, and reactive to light. Conjunctivae are normal.  Neck: Normal range of motion.  Cardiovascular: Normal heart sounds.   Respiratory: Effort normal.  GI: Soft.  Musculoskeletal: Normal range of motion.  Neurological: He is alert.  Skin: Skin is warm and dry.  Psychiatric: He has a normal mood and affect. His speech is normal and behavior is normal. Thought content is paranoid. Cognition and memory are normal. He expresses impulsivity.    Review of Systems  Constitutional: Negative.   HENT: Negative.   Eyes: Negative.   Respiratory: Negative.   Cardiovascular: Negative.   Gastrointestinal: Negative.   Genitourinary: Negative.   Musculoskeletal: Negative.   Skin: Negative.   Neurological: Negative.   Endo/Heme/Allergies: Negative.   Psychiatric/Behavioral: The patient is nervous/anxious.     Blood pressure  115/71, pulse 94, temperature 97.8 F (36.6 C), temperature source Oral, resp. rate 18, height  (1.753 m), weight 111.6 kg (246 lb), SpO2 97 %.Body mass index is 36.33 kg/m.  General Appearance: Disheveled  Eye Contact:  Good  Speech:  Slow  Volume:  Decreased  Mood:  Depressed  Affect:  Inappropriate  Thought Process:  Disorganized and Descriptions of Associations: Loose  Orientation:  Full (Time, Place, and Person)  Thought Content:  Delusions and Paranoid Ideation  Suicidal Thoughts:  No  Homicidal Thoughts:  No  Memory:  Immediate;   Fair Recent;   Fair Remote;   Fair  Judgement:  Poor  Insight:  Lacking  Psychomotor Activity:  Increased  Concentration:  Concentration: Poor and Attention Span: Poor  Recall:  Poor  Fund of Knowledge:  Fair  Language:  Fair  Akathisia:  No  Handed:  Right  AIMS (if indicated):     Assets:  Communication Skills Desire for Improvement Financial Resources/Insurance Housing Leisure Time Resilience Social Support  ADL's:  Intact  Cognition:  WNL  Sleep:  Number of Hours: 4.15     Treatment Plan Summary: Daily contact with patient to assess and evaluate symptoms and progress in treatment and Medication management   Mr. Alabi is a 51 year old male with history of schizoaffective disorder admitted floridly manic in spite of reasonable  medication compliance.  1. Mood and psychosis. He has been maintained on a combination of oral Invega, lithium, and Depakote. He agreed to [place] injections on 04/22/2017 and discontinued oral Invega. We started Clozapine titration slowly as he is oversedated. We gave  ipratroprium for drooling. Clozapine 200 mg tonight.  2. Insomnia. Slept only 4 hours with Restoril again.    3. Hypertension. He is on metoprolol.   4. Diabetes. He is on ADA diet, Lantus and metformin, blood glucose monitoring, SSI. Input from diabetes nurse coordinator is appreciated. Living well with diabetes book  ordered.  5. Smoking. Nicotine patch is available.  6. Sleep apnea. He is on CPAP.  7. Metabolic syndrome monitoring. Lipid panel and TSH are normal, hemoglobin A1c is 10.9.   8. EKG. Normal sinus rhythm, QTc 437.   9. Social. He lives with her elderly parents who no longer feel they can provide care. Placement is necessary. I will order PPD.   10. Disposition. To be established. He will follow up with ACT team.     Kristine Linea, MD 05/01/2017, 3:05 PM

## 2017-05-01 NOTE — Progress Notes (Signed)
Patient up ambulating the unit with a slow steady gait. Patient appeared to be less lethargic this shift. Denies SI/AH, present in the community. Appetite is good, actively participates in group sessions. Patient interacts well with peers and staff. Will continue to monitor.

## 2017-05-02 LAB — GLUCOSE, CAPILLARY
Glucose-Capillary: 127 mg/dL — ABNORMAL HIGH (ref 65–99)
Glucose-Capillary: 102 mg/dL — ABNORMAL HIGH (ref 65–99)

## 2017-05-02 NOTE — [facility] Group Notes (Signed)
LCSW Group Therapy Note  05/02/2017 1:15pm  Type of Therapy/Topic:  Group Therapy:  Balance in Life  Participation Level:  Did Not Attend  Description of Group:    This group will address the concept of balance and how it feels and looks when one is unbalanced. Patients will be encouraged to process areas in their lives that are out of balance and identify reasons for remaining unbalanced. Facilitators will guide patients in utilizing problem-solving interventions to address and correct the stressor making their life unbalanced. Understanding and applying boundaries will be explored and addressed for obtaining and maintaining a balanced life. Patients will be encouraged to explore ways to assertively make their unbalanced needs known to significant others in their lives, using other group members and facilitator for support and feedback.  Therapeutic Goals: 1. Patient will identify two or more emotions or situations they have that consume much of in their lives. 2. Patient will identify signs/triggers that life has become out of balance:  3. Patient will identify two ways to set boundaries in order to achieve balance in their lives:  4. Patient will demonstrate ability to communicate their needs through discussion and/or role plays  Summary of Patient Progress:      Therapeutic Modalities:   Cognitive Behavioral Therapy Solution-Focused Therapy Assertiveness Training  Brittni Hult C Francisca Harbuck, LCSW 05/02/2017 2:24 PM  

## 2017-05-02 NOTE — Progress Notes (Signed)
Pt is very pleasant. Pt is very disheveled with body order. Pt encouraged to take a shower but pt refused. Pt has minimal interactions with staff and peers. Pt continuously pace around nursing station. Pt is alert and oriented x 4. Denies depression, SI, HI or A/V hallucinations. Pt is med compliant, no adverse affects noted. Contracted to safety. 15 min safety checks continues.

## 2017-05-02 NOTE — Plan of Care (Signed)
Problem: Coping: Goal: Ability to cope will improve Outcome: Progressing Pt coping skills will improve for the next 3 shifts.

## 2017-05-02 NOTE — Progress Notes (Signed)
Pleasant and cooperative.  Denies SI/H/AVH.  Verbalizes that there is a group home suppose to come to visit him tomorrow and he hopes that things go well.  Isolates to self.  Out of room for meals and medication pass.  No group attendance.  Support offered. Safety rounds maintained.

## 2017-05-02 NOTE — Plan of Care (Signed)
Problem: Coping: Goal: Ability to verbalize feelings will improve Outcome: Progressing Denies SI/HI/AVH.  Verbalizes that he is ready to go home.

## 2017-05-02 NOTE — [facility] Group Notes (Signed)
[facility] Group Notes:  (Nursing/MHT/Case Management/Adjunct)  Date:  05/02/2017  Time:  11:23 PM  Type of Therapy:  Psychoeducational Skills  Participation Level:  Did Not Attend  Summary of Progress/Problems:  Greg [name] Y Greg [name] 05/02/2017, 11:23 PM 

## 2017-05-02 NOTE — Progress Notes (Signed)
[facility] MD Progress Note  05/02/2017 7:49 AM Greg Elsie Ra.  MRN:  [id number]  Subjective:   History of present illness. Information was obtained from the patient, the chart, and his mother on the phone. The patient was completed by his mother for escalating symptoms of mania with psychosis. The patient became threatening to his elderly parents, refused to take some of his medications, has not been able to sleep, and has been increasingly agitated. He also states started bizarre behavior with consuming large volumes of salt. He has also been neglectful his medical problems. He stopped taking his insulin and blood pressure medication believing that God cured him. The mother reports that the patient has been only partially compliant with medications, picking and choosing what to take. On admission his Lithium level was therapeutic while Depakote level was low. The patient has been taking oral Invega but he is very skilful to cheek his medication. He was on [place] in the past with good results. It is unclear why Invega injections were stopped. According to the mother, all summer long, the patient has been standing in the throat house naked with a necklace made O pieces of concrete, greeting passersby cars, and protesting against abortion. He lately been rather delusional believing that he is God. He grew his hear over the summer but recently buzz cut some of it.   04/29/2017. Greg [name] looks different today. He is up and about this morning. No somnolence or slowness. To the contrary, he is talkative and engaging. It makes me think that his mania is back. He has been taking his medicines but is only able to tolerate 150 mg of Clozapine so far due to sedation and excessive drooling. Sleep is better. Appetite is fair. There are no somatic complaints. His mother told us today that the patient will not be able to return home with his parents due to history of aggression.   04/30/2017. Greg [name]  is rather depressed goday. We had to remaind him that he will not be returning home with his parents. Rather, he will be placed in a group home. PPD placed. Awaiting group home visit. He needed a klot of support today. He still sleep a lot during the day. I will try to increase Clozapine to 200 mg tomorrow.   05/01/2017. Greg [name] feels better today. He accepted the fact that he is going to a group home. He was in a group home in the past. It did not go well, he believes, due to paranoia. He no longer feels paranoid, maybe a little, and hopes to do well. He slept all day yesterday. Today he is in bed but not asleep. I am very reluctant to increase Clozapine. He has no somatic complaints. Hygiene is much better. Still not engageing or participate in programming. We expected group home representative to be here today but so far they did not show up. The PASSR assessor did come by.   05/02/2017. Greg [name] is slightly better groomed today after much encouragement to take a shower. He has no complaints. He is awaiting a visit from prospective group home as he may not return home with his aging parents. He has been threatening to him. Tolerates medications well. Sleepy from Clozapine.   Per nursing: Pt is very pleasant. Pt is very disheveled with body order. Pt encouraged to take a shower but pt refused. Pt has minimal interactions with staff and peers. Pt continuously pace around nursing station. Pt is alert and oriented x  4. Denies depression, SI, HI or A/V hallucinations. Pt is med compliant, no adverse affects noted. Contracted to safety. 15 min safety checks continues.  Principal Problem: Schizoaffective disorder, bipolar type (HCC) Diagnosis:   Patient Active Problem List   Diagnosis Date Noted  . Schizoaffective disorder, bipolar type (HCC) [F25.0] 03/28/2016  . Acute cholecystitis with chronic cholecystitis [K81.2] 03/27/2016  . Anxiety [F41.9]   . 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  Bipolar 2 disorder (HCC) [F31.81]   . Hyponatremia [E87.1] 10/06/2015  . Essential hypertension [I10] 11/28/2014  . Dyslipidemia [E78.5] 11/28/2014  . Obesity [E66.9] 11/28/2014  . Diabetes (HCC) [E11.9] 11/28/2014  . Tobacco use disorder [F17.200] 11/28/2014   Total Time spent with patient: 30 minutes  Past Psychiatric History: long history of bipolar manic episodes, multiple hospitalizations and medication trials, poor treatment compliance.  Past Medical History:  Past Medical History:  Diagnosis Date  . Bipolar 1 disorder (HCC)   . Diabetes (HCC)   . Dyslipidemia   . HTN (hypertension)   . Obesity   . Paranoia (HCC)   . Schizo affective schizophrenia [facility])     Past Surgical History:  Procedure Laterality Date  . APPENDECTOMY  51 years old  . CHOLECYSTECTOMY N/A 03/30/2016   Procedure: LAPAROSCOPIC CHOLECYSTECTOMY;  Surgeon: Gladis Riffle, MD;  Location: [facility] ORS;  Service: General;  Laterality: N/A;  . TONSILLECTOMY  51 year old   Family History:  Family History  Problem Relation Age of Onset  . Depression Mother   . Mental illness Mother   . Depression Father   . Mental illness Father   . Depression Sister   . Mental illness Sister    Family Psychiatric  History: grandmother with schizophrenia. Social History:  History  Alcohol Use No     History  Drug Use No    Social History   Social History  . Marital status: Single    Spouse name: N/A  . Number of children: N/A  . Years of education: N/A   Social History Main Topics  . Smoking status: Current Every Day Smoker    Packs/day: 0.50    Years: 8.00    Types: Cigarettes  . Smokeless tobacco: Current User    Types: Snuff  . Alcohol use No  . Drug use: No  . Sexual activity: No   Other Topics Concern  . None   Social History Narrative  . None   Additional Social History: the patient is disabled from mental illness. He lives with his elderly parents who no longer are able to care for him.                         Sleep: Fair  Appetite:  Fair  Current Medications: Current Facility-Administered Medications  Medication Dose Route Frequency Provider Last Rate Last Dose  . acetaminophen (TYLENOL) tablet 650 mg  650 mg Oral Q6H PRN [name], [name], MD      . alum & mag hydroxide-simeth (MAALOX/MYLANTA) 200-200-20 MG/5ML suspension 30 mL  30 mL Oral Q4H PRN [name], [name], MD      . cloZAPine (CLOZARIL) tablet 200 mg  200 mg Oral QHS Verdell Kincannon B, MD   200 mg at 05/01/17 2102  . divalproex (DEPAKOTE) DR tablet 750 mg  750 mg Oral Q12H [name], [name], MD   750 mg at 05/01/17 1952  . hydrOXYzine (ATARAX/VISTARIL) tablet 50 mg  50 mg Oral TID PRN [name], [name],  MD   50 mg at 04/27/17 2250  . insulin glargine (LANTUS) injection 10 Units  10 Units Subcutaneous Daily [name], [name], MD   10 Units at 05/01/17 2105  . ipratropium (ATROVENT) 0.06 % nasal spray 2 spray  2 spray Each Nare QHS Abdiel Blackerby B, MD   2 spray at 05/01/17 2103  . lithium carbonate (ESKALITH) CR tablet 450 mg  450 mg Oral Q12H [name], [name], MD   450 mg at 05/01/17 1957  . magnesium hydroxide (MILK OF MAGNESIA) suspension 30 mL  30 mL Oral Daily PRN [name], [name], MD      . metFORMIN (GLUCOPHAGE) tablet 1,000 mg  1,000 mg Oral BID WC [name], [name], MD   1,000 mg at 05/01/17 1650  . metoprolol succinate (TOPROL-XL) 24 hr tablet 50 mg  50 mg Oral Daily [name], [name], MD   50 mg at 05/01/17 0748  . nicotine (NICODERM CQ - dosed in mg/24 hours) patch 21 mg  21 mg Transdermal Daily Lundynn Cohoon B, MD   21 mg at 05/01/17 0748  . paliperidone (INVEGA SUSTENNA) injection 234 mg  234 mg Intramuscular Q28 days Lizann Edelman B, MD   234 mg at 04/22/17 1701  . pneumococcal 23 valent vaccine (PNU-IMMUNE) injection 0.5 mL  0.5 mL Intramuscular Tomorrow-1000 Scotty Pinder B, MD      . temazepam (RESTORIL) capsule 15 mg  15 mg Oral QHS Anuja Manka B, MD   15 mg at 05/01/17  2102    Lab Results:  Results for orders placed or performed during the hospital encounter of 04/16/17 (from the past 48 hour(s))  Glucose, capillary     Status: Abnormal   Collection Time: 04/30/17  8:15 PM  Result Value Ref Range   Glucose-Capillary 101 (H) 65 - 99 mg/dL   Comment 1 Notify RN   Glucose, capillary     Status: Abnormal   Collection Time: 05/01/17  9:00 PM  Result Value Ref Range   Glucose-Capillary 127 (H) 65 - 99 mg/dL    Blood Alcohol level:  Lab Results  Component Value Date   ETH <5 04/14/2017   ETH <5 10/06/2015    Metabolic Disorder Labs: Lab Results  Component Value Date   HGBA1C 10.9 (H) 04/17/2017   MPG 266.13 04/17/2017   MPG 154 02/09/2015   Lab Results  Component Value Date   PROLACTIN 43.3 (H) 04/17/2017   PROLACTIN 45.2 (H) 10/07/2015   Lab Results  Component Value Date   CHOL 103 04/17/2017   TRIG 110 04/17/2017   HDL 34 (L) 04/17/2017   CHOLHDL 3.0 04/17/2017   VLDL 22 04/17/2017   LDLCALC 47 04/17/2017   LDLCALC 44 10/07/2015    Physical Findings: AIMS:  , ,  ,  ,    CIWA:    COWS:     Musculoskeletal: Strength & Muscle Tone: within normal limits Gait & Station: normal Patient leans: N/A  Psychiatric Specialty Exam: Physical Exam  Nursing note and vitals reviewed. Constitutional: He appears well-developed and well-nourished.  HENT:  Head: Normocephalic and atraumatic.  Eyes: Pupils are equal, round, and reactive to light. Conjunctivae are normal.  Neck: Normal range of motion.  Cardiovascular: Normal heart sounds.   Respiratory: Effort normal.  GI: Soft.  Musculoskeletal: Normal range of motion.  Neurological: He is alert.  Skin: Skin is warm and dry.  Psychiatric: He has a normal mood and affect. His speech is normal and behavior is normal. Thought content  is paranoid. Cognition and memory are normal. He expresses impulsivity.    Review of Systems  Constitutional: Negative.   HENT: Negative.   Eyes:  Negative.   Respiratory: Negative.   Cardiovascular: Negative.   Gastrointestinal: Negative.   Genitourinary: Negative.   Musculoskeletal: Negative.   Skin: Negative.   Neurological: Negative.   Endo/Heme/Allergies: Negative.   Psychiatric/Behavioral: The patient is nervous/anxious.     Blood pressure 115/71, pulse 94, temperature 97.8 F (36.6 C), temperature source Oral, resp. rate 18, height  (1.753 m), weight 111.6 kg (246 lb), SpO2 97 %.Body mass index is 36.33 kg/m.  General Appearance: Disheveled  Eye Contact:  Good  Speech:  Slow  Volume:  Decreased  Mood:  Depressed  Affect:  Inappropriate  Thought Process:  Disorganized and Descriptions of Associations: Loose  Orientation:  Full (Time, Place, and Person)  Thought Content:  Delusions and Paranoid Ideation  Suicidal Thoughts:  No  Homicidal Thoughts:  No  Memory:  Immediate;   Fair Recent;   Fair Remote;   Fair  Judgement:  Poor  Insight:  Lacking  Psychomotor Activity:  Increased  Concentration:  Concentration: Poor and Attention Span: Poor  Recall:  Poor  Fund of Knowledge:  Fair  Language:  Fair  Akathisia:  No  Handed:  Right  AIMS (if indicated):     Assets:  Communication Skills Desire for Improvement Financial Resources/Insurance Housing Leisure Time Resilience Social Support  ADL's:  Intact  Cognition:  WNL  Sleep:  Number of Hours: 5     Treatment Plan Summary: Daily contact with patient to assess and evaluate symptoms and progress in treatment and Medication management   Greg [name] is a 51 year old male with history of schizoaffective disorder admitted floridly manic in spite of reasonable medication compliance.  1. Mood and psychosis. He has been maintained on a combination of Invega, lithium 450 mg bid, and Depakote 750 mg bid. He received [place] injections on 04/22/2017. We started Clozapine titration slowly as he is oversedated. We stopped at 200 mg nightly.  We gave   ipratroprium for drooling.   2. Insomnia. Slept only 5 hours with Restoril again.    3. Hypertension. He is on metoprolol.   4. Diabetes. He is on ADA diet, Lantus and metformin, blood glucose monitoring, SSI. Input from diabetes nurse coordinator is appreciated. Living well with diabetes book ordered.  5. Smoking. Nicotine patch is available.  6. Sleep apnea. He is on CPAP.  7. Metabolic syndrome monitoring. Lipid panel and TSH are normal, hemoglobin A1c is 10.9.   8. EKG. Normal sinus rhythm, QTc 437.   9. Social. He lives with her elderly parents who no longer feel they can provide care. Placement is necessary. PPD placed.   10. Disposition. To be established. He will follow up with ACT team.     Kristine Linea, MD 05/02/2017, 7:49 AM

## 2017-05-02 NOTE — Progress Notes (Signed)
PASSR approved: [id number] K Effective 05/02/17. Garner Nash, MSW, LCSW Clinical Social Worker 05/02/2017 3:16 PM

## 2017-05-03 LAB — CBC WITH DIFFERENTIAL/PLATELET
Basophils Absolute: 0 10*3/uL (ref 0–0.1)
Basophils Relative: 1 %
Eosinophils Absolute: 0.6 10*3/uL (ref 0–0.7)
Eosinophils Relative: 7 %
HCT: 40.7 % (ref 40.0–52.0)
Hemoglobin: 13.7 g/dL (ref 13.0–18.0)
Lymphocytes Relative: 24 %
Lymphs Abs: 2.1 10*3/uL (ref 1.0–3.6)
MCH: 28 pg (ref 26.0–34.0)
MCHC: 33.7 g/dL (ref 32.0–36.0)
MCV: 83.3 fL (ref 80.0–100.0)
Monocytes Absolute: 0.8 10*3/uL (ref 0.2–1.0)
Monocytes Relative: 9 %
Neutro Abs: 5.4 10*3/uL (ref 1.4–6.5)
Neutrophils Relative %: 59 %
Platelets: 248 10*3/uL (ref 150–440)
RBC: 4.88 MIL/uL (ref 4.40–5.90)
RDW: 13.8 % (ref 11.5–14.5)
WBC: 9 10*3/uL (ref 3.8–10.6)

## 2017-05-03 LAB — LITHIUM LEVEL: Lithium Lvl: 1 mmol/L (ref 0.60–1.20)

## 2017-05-03 LAB — VALPROIC ACID LEVEL: Valproic Acid Lvl: 78 ug/mL (ref 50.0–100.0)

## 2017-05-03 LAB — AMMONIA: Ammonia: 11 umol/L (ref 9–35)

## 2017-05-03 NOTE — Progress Notes (Signed)
Clozapine REMS Monitoring:  Patient ordered clozapine 200 mg at bedtime.  ANC 5200  Labs submitted to clozapine registry. Patient eligible to receive clozapine with weekly lab monitoring.  Will recheck CBC w/diff in 1 week.  Greg [name], [facility] 05/03/17 9:15 AM

## 2017-05-03 NOTE — Plan of Care (Signed)
Problem: Coping: Goal: Ability to verbalize feelings will improve Outcome: Progressing Able to verbalize feelings without difficulty.

## 2017-05-03 NOTE — [facility] Group Notes (Signed)
[facility] LCSW Group Therapy Note  Date/Time: 05/03/17, 0930  Type of Therapy and Topic:  Group Therapy:  Feelings around Relapse and Recovery  Participation Level:  Did Not Attend   Mood:  Description of Group:    Patients in this group will discuss emotions they experience before and after a relapse. They will process how experiencing these feelings, or avoidance of experiencing them, relates to having a relapse. Facilitator will guide patients to explore emotions they have related to recovery. Patients will be encouraged to process which emotions are more powerful. They will be guided to discuss the emotional reaction significant others in their lives may have to patients' relapse or recovery. Patients will be assisted in exploring ways to respond to the emotions of others without this contributing to a relapse.  Therapeutic Goals: 1. Patient will identify two or more emotions that lead to relapse for them:  2. Patient will identify two emotions that result when they relapse:  3. Patient will identify two emotions related to recovery:  4. Patient will demonstrate ability to communicate their needs through discussion and/or role plays.   Summary of Patient Progress:     Therapeutic Modalities:   Cognitive Behavioral Therapy Solution-Focused Therapy Assertiveness Training Relapse Prevention Therapy  Greg Colter Magowan, LCSW       

## 2017-05-03 NOTE — Progress Notes (Signed)
Denies SI/HI/AVH.  Continues to isolate to self, not interaction with peers, minimal interaction with staff.  No group attendance.  Medication compliant.  Support offered.  Safety rounds maintained.

## 2017-05-03 NOTE — Progress Notes (Signed)
[facility] MD Progress Note  05/03/2017 11:01 PM Greg [name].  MRN:  [id number]  Subjective:   History of present illness. Information was obtained from the patient, the chart, and his mother on the phone. The patient was completed by his mother for escalating symptoms of mania with psychosis. The patient became threatening to his elderly parents, refused to take some of his medications, has not been able to sleep, and has been increasingly agitated. He also states started bizarre behavior with consuming large volumes of salt. He has also been neglectful his medical problems. He stopped taking his insulin and blood pressure medication believing that God cured him. The mother reports that the patient has been only partially compliant with medications, picking and choosing what to take. On admission his Lithium level was therapeutic while Depakote level was low. The patient has been taking oral Invega but he is very skilful to cheek his medication. He was on [place] in the past with good results. It is unclear why Invega injections were stopped. According to the mother, all summer long, the patient has been standing in the throat house naked with a necklace made O pieces of concrete, greeting passersby cars, and protesting against abortion. He lately been rather delusional believing that he is God. He grew his hear over the summer but recently buzz cut some of it.   05/03/2017. Greg [name] has no complaints except for slight paranoia that never goes away. He stays in his room most of the time and does not interact with peers or staff. He denies depression, anxiety, suicidal or homicidal thoughts. He met with group home owner and was accepted. Unfortunately, they will not be able to accept him today. There is sedation and drooling from Clozapine.  Anticipated discharge on Monday. Per nursing report, he is compliant with medications. He does not participate in programming.   Principal Problem:  Schizoaffective disorder, bipolar type ([place]) Diagnosis:   Patient Active Problem List   Diagnosis Date Noted  . Schizoaffective disorder, bipolar type ([place]) [F25.0] 03/28/2016  . Acute cholecystitis with chronic cholecystitis [K81.2] 03/27/2016  . Anxiety [F41.9]   . 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 Bipolar 2 disorder ([place]) [F31.81]   . Hyponatremia [E87.1] 10/06/2015  . Essential hypertension [I10] 11/28/2014  . Dyslipidemia [E78.5] 11/28/2014  . Obesity [E66.9] 11/28/2014  . Diabetes ([place]) [E11.9] 11/28/2014  . Tobacco use disorder [F17.200] 11/28/2014   Total Time spent with patient: 30 minutes  Past Psychiatric History: long history of bipolar manic episodes, multiple hospitalizations and medication trials, poor treatment compliance.  Past Medical History:  Past Medical History:  Diagnosis Date  . Bipolar 1 disorder ([place])   . Diabetes ([place])   . Dyslipidemia   . HTN (hypertension)   . Obesity   . Paranoia ([place])   . Schizo affective schizophrenia [facility])     Past Surgical History:  Procedure Laterality Date  . APPENDECTOMY  51 years old  . CHOLECYSTECTOMY N/A 03/30/2016   Procedure: LAPAROSCOPIC CHOLECYSTECTOMY;  Surgeon: Hubbard Robinson, MD;  Location: [facility] ORS;  Service: General;  Laterality: N/A;  . TONSILLECTOMY  51 year old   Family History:  Family History  Problem Relation Age of Onset  . Depression Mother   . Mental illness Mother   . Depression Father   . Mental illness Father   . Depression Sister   . Mental illness Sister    Family Psychiatric  History: grandmother with schizophrenia. Social History:  History  Alcohol  Use No     History  Drug Use No    Social History   Social History  . Marital status: Single    Spouse name: N/A  . Number of children: N/A  . Years of education: N/A   Social History Main Topics  . Smoking status: Current Every Day Smoker    Packs/day: 0.50    Years: 8.00    Types: Cigarettes  . Smokeless  tobacco: Current User    Types: Snuff  . Alcohol use No  . Drug use: No  . Sexual activity: No   Other Topics Concern  . None   Social History Narrative  . None   Additional Social History: the patient is disabled from mental illness. He lives with his elderly parents who no longer are able to care for him.                        Sleep: Fair  Appetite:  Fair  Current Medications: Current Facility-Administered Medications  Medication Dose Route Frequency Provider Last Rate Last Dose  . acetaminophen (TYLENOL) tablet 650 mg  650 mg Oral Q6H PRN [name], [name], MD      . alum & mag hydroxide-simeth (MAALOX/MYLANTA) 200-200-20 MG/5ML suspension 30 mL  30 mL Oral Q4H PRN [name], [name], MD      . cloZAPine (CLOZARIL) tablet 200 mg  200 mg Oral QHS Matea Stanard B, MD   200 mg at 05/03/17 2155  . divalproex (DEPAKOTE) DR tablet 750 mg  750 mg Oral Q12H [name], [name], MD   750 mg at 05/03/17 2018  . hydrOXYzine (ATARAX/VISTARIL) tablet 50 mg  50 mg Oral TID PRN [name], Madie Reno, MD   50 mg at 04/27/17 2250  . insulin glargine (LANTUS) injection 10 Units  10 Units Subcutaneous Daily [name], Madie Reno, MD   10 Units at 05/03/17 2154  . ipratropium (ATROVENT) 0.06 % nasal spray 2 spray  2 spray Each Nare QHS Sharanya Templin B, MD   2 spray at 05/03/17 2156  . lithium carbonate (ESKALITH) CR tablet 450 mg  450 mg Oral Q12H [name], Madie Reno, MD   450 mg at 05/03/17 2019  . magnesium hydroxide (MILK OF MAGNESIA) suspension 30 mL  30 mL Oral Daily PRN [name], [name], MD      . metFORMIN (GLUCOPHAGE) tablet 1,000 mg  1,000 mg Oral BID WC [name], Madie Reno, MD   1,000 mg at 05/03/17 1648  . metoprolol succinate (TOPROL-XL) 24 hr tablet 50 mg  50 mg Oral Daily [name], Madie Reno, MD   50 mg at 05/03/17 0906  . nicotine (NICODERM CQ - dosed in mg/24 hours) patch 21 mg  21 mg Transdermal Daily Clarke Peretz B, MD   21 mg at 05/03/17 0906  . paliperidone (INVEGA SUSTENNA)  injection 234 mg  234 mg Intramuscular Q28 days Sajad Glander B, MD   234 mg at 04/22/17 1701  . pneumococcal 23 valent vaccine (PNU-IMMUNE) injection 0.5 mL  0.5 mL Intramuscular Tomorrow-1000 Trooper Olander B, MD      . temazepam (RESTORIL) capsule 15 mg  15 mg Oral QHS Vernisha Bacote B, MD   15 mg at 05/03/17 2155    Lab Results:  Results for orders placed or performed during the hospital encounter of 04/16/17 (from the past 48 hour(s))  Glucose, capillary     Status: Abnormal   Collection Time: 05/02/17  7:52 PM  Result Value Ref  Range   Glucose-Capillary 102 (H) 65 - 99 mg/dL   Comment 1 Notify RN   CBC with Differential/Platelet     Status: None   Collection Time: 05/03/17  7:35 AM  Result Value Ref Range   WBC 9.0 3.8 - 10.6 K/uL   RBC 4.88 4.40 - 5.90 MIL/uL   Hemoglobin 13.7 13.0 - 18.0 g/dL   HCT 40.7 40.0 - 52.0 %   MCV 83.3 80.0 - 100.0 fL   MCH 28.0 26.0 - 34.0 pg   MCHC 33.7 32.0 - 36.0 g/dL   RDW 13.8 11.5 - 14.5 %   Platelets 248 150 - 440 K/uL   Neutrophils Relative % 59 %   Neutro Abs 5.4 1.4 - 6.5 K/uL   Lymphocytes Relative 24 %   Lymphs Abs 2.1 1.0 - 3.6 K/uL   Monocytes Relative 9 %   Monocytes Absolute 0.8 0.2 - 1.0 K/uL   Eosinophils Relative 7 %   Eosinophils Absolute 0.6 0 - 0.7 K/uL   Basophils Relative 1 %   Basophils Absolute 0.0 0 - 0.1 K/uL  Lithium level     Status: None   Collection Time: 05/03/17  7:35 AM  Result Value Ref Range   Lithium Lvl 1.00 0.60 - 1.20 mmol/L  Valproic acid level     Status: None   Collection Time: 05/03/17  7:35 AM  Result Value Ref Range   Valproic Acid Lvl 78 50.0 - 100.0 ug/mL  Ammonia     Status: None   Collection Time: 05/03/17  7:35 AM  Result Value Ref Range   Ammonia 11 9 - 35 umol/L    Blood Alcohol level:  Lab Results  Component Value Date   ETH <5 04/14/2017   ETH <5 76/16/0737    Metabolic Disorder Labs: Lab Results  Component Value Date   HGBA1C 10.9 (H) 04/17/2017   MPG  266.13 04/17/2017   MPG 154 02/09/2015   Lab Results  Component Value Date   PROLACTIN 43.3 (H) 04/17/2017   PROLACTIN 45.2 (H) 10/07/2015   Lab Results  Component Value Date   CHOL 103 04/17/2017   TRIG 110 04/17/2017   HDL 34 (L) 04/17/2017   CHOLHDL 3.0 04/17/2017   VLDL 22 04/17/2017   LDLCALC 47 04/17/2017   LDLCALC 44 10/07/2015    Physical Findings: AIMS:  , ,  ,  ,    CIWA:    COWS:     Musculoskeletal: Strength & Muscle Tone: within normal limits Gait & Station: normal Patient leans: N/A  Psychiatric Specialty Exam: Physical Exam  Nursing note and vitals reviewed. Genitourinary: Rectum normal.  Psychiatric: He has a normal mood and affect. His speech is normal and behavior is normal. Thought content is paranoid. Cognition and memory are normal. He expresses impulsivity.    Review of Systems  HENT:       Drooling  Neurological: Negative.   Psychiatric/Behavioral: The patient is nervous/anxious.        Paranoia  All other systems reviewed and are negative.   Blood pressure 113/76, pulse 94, temperature 97.8 F (36.6 C), temperature source Oral, resp. rate 18, height 5' 9" (1.753 m), weight 111.6 kg (246 lb), SpO2 100 %.Body mass index is 36.33 kg/m.  General Appearance: Disheveled  Eye Contact:  Good  Speech:  Slow  Volume:  Decreased  Mood:  Anxious  Affect:  Flat  Thought Process:  Goal Directed and Descriptions of Associations: Intact  Orientation:  Full (Time, Place,  and Person)  Thought Content:  Paranoid Ideation  Suicidal Thoughts:  No  Homicidal Thoughts:  No  Memory:  Immediate;   Fair Recent;   Fair Remote;   Fair  Judgement:  Poor  Insight:  Lacking  Psychomotor Activity:  Decreased  Concentration:  Concentration: Poor and Attention Span: Poor  Recall:  Poor  Fund of Knowledge:  Fair  Language:  Fair  Akathisia:  No  Handed:  Right  AIMS (if indicated):     Assets:  Communication Skills Desire for Improvement Financial  Resources/Insurance Housing Leisure Time Resilience Social Support  ADL's:  Intact  Cognition:  WNL  Sleep:  Number of Hours: 4.5     Treatment Plan Summary: Daily contact with patient to assess and evaluate symptoms and progress in treatment and Medication management   Greg [name] is a 51 year old male with history of schizoaffective disorder admitted floridly manic in spite of reasonable medication compliance.  1. Mood and psychosis. We will continue monthly Invega sustenna injections next one on 10/24 and Clozapine 200 mg nightly for psychosis and lithium 450 mg bid, and Depakote 750 mg bid for mood stabilization Li 1.0  and VPA level 78 on 05/03/2017.   2. Insomnia. Slept only 4, He  5 hours with Restoril again.    3. Hypertension. He is on metoprolol.   4. Diabetes. He is on ADA diet, Lantus and metformin, blood glucose monitoring, SSI. Input from diabetes nurse coordinator is appreciated. Living well with diabetes book ordered.  5. Smoking. Nicotine patch is available.  6. Sleep apnea. He refuses CPAP.  7. Metabolic syndrome monitoring. Lipid panel and TSH are normal, hemoglobin A1c is 10.9.   8. EKG. Normal sinus rhythm, QTc 437.   9. Social. He lives with her elderly parents who no longer feel they can provide care. Placement is necessary. PPD placed.   10. Disposition. He will be discharged to a grouphome on Monday. He will follow up with ACT team.     Orson Slick, MD 05/03/2017, 11:01 PM

## 2017-05-03 NOTE — Progress Notes (Signed)
D: Pt denies SI/HI/AVH, affect is flat but brightens upon approach. Pt is pleasant and cooperative, patient thoughts are less disorganized. Patient was noted during the shift after midnight to be pacing around, confused and was frequently redirected to his room. Pt appears less anxious and he is interacting with peers and staff appropriately.  A: Pt was offered support and encouragement. Pt was given scheduled medications. Pt was encouraged to attend groups. Q 15 minute checks were done for safety.  R:Pt did not attend evening group, he is compliant with medication, Pt has no complaints, and he is receptive to treatment.Safety maintained on unit.

## 2017-05-03 NOTE — Progress Notes (Signed)
Representative from [facility] came by and visited with pt.  Cannot remember her name.  She said pt appeared appropriate for them.  She agreed to talk to Carney Bern about admission date.  CSW did not get her number.  CSW called [facility]ney Bern later who agreed to call back about date of admission, but then did not.  Carney Bern cell: [phone].  Stephanie from [facility] called and they can see pt on Tuesday assuming he discharges.  Need to call them back with specifics once plan is in place. Garner Nash, MSW, LCSW Clinical Social Worker 05/03/2017 3:21 PM

## 2017-05-04 NOTE — [facility] Group Notes (Signed)
LCSW Group Therapy Note  05/04/2017 1:00pm  Type of Therapy and Topic:  Group Therapy:  Cognitive Distortions  Participation Level:  Did Not Attend   Description of Group:    Patients in this group will be introduced to the topic of cognitive distortions.  Patients will identify and describe cognitive distortions, describe the feelings these distortions create for them.  Patients will identify one or more situations in their personal life where they have cognitively distorted thinking and will verbalize challenging this cognitive distortion through positive thinking skills.  Patients will practice the skill of using positive affirmations to challenge cognitive distortions using affirmation cards.    Therapeutic Goals:  1. Patient will identify two or more cognitive distortions they have used 2. Patient will identify one or more emotions that stem from use of a cognitive distortion 3. Patient will demonstrate use of a positive affirmation to counter a cognitive distortion through discussion and/or role play. 4. Patient will describe one way cognitive distortions can be detrimental to wellness   Summary of Patient Progress:     Therapeutic Modalities:   Cognitive Behavioral Therapy Motivational Interviewing   Glennon Mac, LCSW 05/04/2017 2:41 PM

## 2017-05-04 NOTE — Plan of Care (Signed)
Problem: Coping: Goal: Ability to cope will improve Outcome: Progressing Patient was observed sleeping in his room on shift rounds.  He was friendly when awakened and agreed to stay awake for the evening.  He talked with Clinical research associate about the group home interview today and could state facts he recalled about the home.   Problem: Safety: Goal: Ability to redirect hostility and anger into socially appropriate behaviors will improve Outcome: Progressing Patient is without bizarre behavior or aggression.  He denies SI/HI/AV/H and contracts for safety.

## 2017-05-04 NOTE — Progress Notes (Signed)
[facility] MD Progress Note  05/04/2017 6:13 PM Greg [name].  MRN:  [id number]  Subjective:   History of present illness. Information was obtained from the patient, the chart, and his mother on the phone. The patient was completed by his mother for escalating symptoms of mania with psychosis. The patient became threatening to his elderly parents, refused to take some of his medications, has not been able to sleep, and has been increasingly agitated. He also states started bizarre behavior with consuming large volumes of salt. He has also been neglectful his medical problems. He stopped taking his insulin and blood pressure medication believing that God cured him. The mother reports that the patient has been only partially compliant with medications, picking and choosing what to take. On admission his Lithium level was therapeutic while Depakote level was low. The patient has been taking oral Invega but he is very skilful to cheek his medication. He was on [place] in the past with good results. It is unclear why Invega injections were stopped. According to the mother, all summer long, the patient has been standing in the throat house naked with a necklace made O pieces of concrete, greeting passersby cars, and protesting against abortion. He lately been rather delusional believing that he is God. He grew his hear over the summer but recently buzz cut some of it.   05/03/2017. Greg [name] has no complaints except for slight paranoia that never goes away. He stays in his room most of the time and does not interact with peers or staff. He denies depression, anxiety, suicidal or homicidal thoughts. He met with group home owner and was accepted. Unfortunately, they will not be able to accept him today. There is sedation and drooling from Clozapine.  Anticipated discharge on Monday. Per nursing report, he is compliant with medications. He does not participate in programming.   05/04/17: Paranoid  thoughts overall decreased and he is not endorsing any auditory or visual hallucinations. He has been sleeping most of the morning and missed lunch today. He does not want to go to groups because he finds groups "boring". He denies any depressive symptoms, feelings of hopelessness or severe anxiety. He feels disappointed in himself for having to leave his parents and go to a group home. He is hoping that one day he can return to his parents home. He denies any current active or passive suicidal thoughts or homicidal thoughts. He denies any auditory or visual hallucinations. He has been fairly as attentive to his room and does not interact a lot with staff or peers. He is tolerating psychotropic medications fairly well without any adverse side effects. He denies any problems with insomnia. He denies any somatic complaints. Labs were reviewed with the patient. Vital signs are stable.    Principal Problem: Schizoaffective disorder, bipolar type ([place]) Diagnosis:   Patient Active Problem List   Diagnosis Date Noted  . Schizoaffective disorder, bipolar type ([place]) [F25.0] 03/28/2016  . Acute cholecystitis with chronic cholecystitis [K81.2] 03/27/2016  . Anxiety [F41.9]   . 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 Bipolar 2 disorder ([place]) [F31.81]   . Hyponatremia [E87.1] 10/06/2015  . Essential hypertension [I10] 11/28/2014  . Dyslipidemia [E78.5] 11/28/2014  . Obesity [E66.9] 11/28/2014  . Diabetes ([place]) [E11.9] 11/28/2014  . Tobacco use disorder [F17.200] 11/28/2014   Total Time spent with patient: 30 minutes  Past Psychiatric History: long history of bipolar manic episodes, multiple hospitalizations and medication trials, poor treatment compliance.  Past Medical History:  Past Medical History:  Diagnosis Date  . Bipolar 1 disorder ([place])   . Diabetes ([place])   . Dyslipidemia   . HTN (hypertension)   . Obesity   . Paranoia ([place])   . Schizo affective schizophrenia [facility])     Past  Surgical History:  Procedure Laterality Date  . APPENDECTOMY  51 years old  . CHOLECYSTECTOMY N/A 03/30/2016   Procedure: LAPAROSCOPIC CHOLECYSTECTOMY;  Surgeon: Hubbard Robinson, MD;  Location: [facility] ORS;  Service: General;  Laterality: N/A;  . TONSILLECTOMY  51 year old   Family History:  Family History  Problem Relation Age of Onset  . Depression Mother   . Mental illness Mother   . Depression Father   . Mental illness Father   . Depression Sister   . Mental illness Sister    Family Psychiatric  History: grandmother with schizophrenia. Social History:  History  Alcohol Use No     History  Drug Use No    Social History   Social History  . Marital status: Single    Spouse name: N/A  . Number of children: N/A  . Years of education: N/A   Social History Main Topics  . Smoking status: Current Every Day Smoker    Packs/day: 0.50    Years: 8.00    Types: Cigarettes  . Smokeless tobacco: Current User    Types: Snuff  . Alcohol use No  . Drug use: No  . Sexual activity: No   Other Topics Concern  . None   Social History Narrative  . None   Additional Social History: the patient is disabled from mental illness. He lives with his elderly parents who no longer are able to care for him.        Sleep: Fair; sleeping some during the daytime  Appetite:  Fair  Current Medications: Current Facility-Administered Medications  Medication Dose Route Frequency Provider Last Rate Last Dose  . acetaminophen (TYLENOL) tablet 650 mg  650 mg Oral Q6H PRN [name], [name], MD      . alum & mag hydroxide-simeth (MAALOX/MYLANTA) 200-200-20 MG/5ML suspension 30 mL  30 mL Oral Q4H PRN [name], [name], MD      . cloZAPine (CLOZARIL) tablet 200 mg  200 mg Oral QHS Pucilowska, Jolanta B, MD   200 mg at 05/03/17 2155  . divalproex (DEPAKOTE) DR tablet 750 mg  750 mg Oral Q12H [name], Madie Reno, MD   750 mg at 05/04/17 0908  . hydrOXYzine (ATARAX/VISTARIL) tablet 50 mg  50 mg Oral TID PRN  [name], Madie Reno, MD   50 mg at 04/27/17 2250  . insulin glargine (LANTUS) injection 10 Units  10 Units Subcutaneous Daily [name], Madie Reno, MD   10 Units at 05/03/17 2154  . ipratropium (ATROVENT) 0.06 % nasal spray 2 spray  2 spray Each Nare QHS Pucilowska, Jolanta B, MD   2 spray at 05/03/17 2156  . lithium carbonate (ESKALITH) CR tablet 450 mg  450 mg Oral Q12H [name], Madie Reno, MD   450 mg at 05/04/17 0908  . magnesium hydroxide (MILK OF MAGNESIA) suspension 30 mL  30 mL Oral Daily PRN [name], [name], MD      . metFORMIN (GLUCOPHAGE) tablet 1,000 mg  1,000 mg Oral BID WC [name], Madie Reno, MD   1,000 mg at 05/04/17 1638  . metoprolol succinate (TOPROL-XL) 24 hr tablet 50 mg  50 mg Oral Daily [name], Madie Reno, MD   50 mg at 05/04/17 0908  .  nicotine (NICODERM CQ - dosed in mg/24 hours) patch 21 mg  21 mg Transdermal Daily Pucilowska, Jolanta B, MD   21 mg at 05/04/17 0908  . paliperidone (INVEGA SUSTENNA) injection 234 mg  234 mg Intramuscular Q28 days Pucilowska, Jolanta B, MD   234 mg at 04/22/17 1701  . pneumococcal 23 valent vaccine (PNU-IMMUNE) injection 0.5 mL  0.5 mL Intramuscular Tomorrow-1000 Pucilowska, Jolanta B, MD      . temazepam (RESTORIL) capsule 15 mg  15 mg Oral QHS Pucilowska, Jolanta B, MD   15 mg at 05/03/17 2155    Lab Results:  Results for orders placed or performed during the hospital encounter of 04/16/17 (from the past 48 hour(s))  Glucose, capillary     Status: Abnormal   Collection Time: 05/02/17  7:52 PM  Result Value Ref Range   Glucose-Capillary 102 (H) 65 - 99 mg/dL   Comment 1 Notify RN   CBC with Differential/Platelet     Status: None   Collection Time: 05/03/17  7:35 AM  Result Value Ref Range   WBC 9.0 3.8 - 10.6 K/uL   RBC 4.88 4.40 - 5.90 MIL/uL   Hemoglobin 13.7 13.0 - 18.0 g/dL   HCT 40.7 40.0 - 52.0 %   MCV 83.3 80.0 - 100.0 fL   MCH 28.0 26.0 - 34.0 pg   MCHC 33.7 32.0 - 36.0 g/dL   RDW 13.8 11.5 - 14.5 %   Platelets 248 150 - 440 K/uL    Neutrophils Relative % 59 %   Neutro Abs 5.4 1.4 - 6.5 K/uL   Lymphocytes Relative 24 %   Lymphs Abs 2.1 1.0 - 3.6 K/uL   Monocytes Relative 9 %   Monocytes Absolute 0.8 0.2 - 1.0 K/uL   Eosinophils Relative 7 %   Eosinophils Absolute 0.6 0 - 0.7 K/uL   Basophils Relative 1 %   Basophils Absolute 0.0 0 - 0.1 K/uL  Lithium level     Status: None   Collection Time: 05/03/17  7:35 AM  Result Value Ref Range   Lithium Lvl 1.00 0.60 - 1.20 mmol/L  Valproic acid level     Status: None   Collection Time: 05/03/17  7:35 AM  Result Value Ref Range   Valproic Acid Lvl 78 50.0 - 100.0 ug/mL  Ammonia     Status: None   Collection Time: 05/03/17  7:35 AM  Result Value Ref Range   Ammonia 11 9 - 35 umol/L    Blood Alcohol level:  Lab Results  Component Value Date   ETH <5 04/14/2017   ETH <5 59/16/3846    Metabolic Disorder Labs: Lab Results  Component Value Date   HGBA1C 10.9 (H) 04/17/2017   MPG 266.13 04/17/2017   MPG 154 02/09/2015   Lab Results  Component Value Date   PROLACTIN 43.3 (H) 04/17/2017   PROLACTIN 45.2 (H) 10/07/2015   Lab Results  Component Value Date   CHOL 103 04/17/2017   TRIG 110 04/17/2017   HDL 34 (L) 04/17/2017   CHOLHDL 3.0 04/17/2017   VLDL 22 04/17/2017   LDLCALC 47 04/17/2017   LDLCALC 44 10/07/2015     Musculoskeletal: Strength & Muscle Tone: within normal limits Gait & Station: normal Patient leans: N/A  Psychiatric Specialty Exam: Physical Exam  Nursing note and vitals reviewed. Genitourinary: Rectum normal.  Psychiatric: He has a normal mood and affect. His speech is normal and behavior is normal. Thought content is paranoid. Cognition and memory are normal. He  expresses impulsivity.    Review of Systems  Constitutional: Negative.   HENT: Negative.        Drooling  Eyes: Negative.   Respiratory: Negative.   Cardiovascular: Negative.   Gastrointestinal: Negative.   Genitourinary: Negative.   Musculoskeletal: Negative.    Skin: Negative.   Neurological: Negative.   Endo/Heme/Allergies: Negative.   Psychiatric/Behavioral:       Paranoia    Blood pressure 109/75, pulse 98, temperature 98.8 F (37.1 C), temperature source Oral, resp. rate 18, height 5' 9"  (1.753 m), weight 111.6 kg (246 lb), SpO2 100 %.Body mass index is 36.33 kg/m.  General Appearance: Disheveled  Eye Contact:  Good  Speech:  Clear and Coherent and Normal Rate  Volume:  Normal  Mood:  "OK"  Affect:  Flat  Thought Process:  Goal Directed and Descriptions of Associations: Intact  Orientation:  Full (Time, Place, and Person)  Thought Content:  Paranoid Ideation  Has decreased  Suicidal Thoughts:  No  Homicidal Thoughts:  No  Memory:  Immediate;   Fair Recent;   Fair Remote;   Fair  Judgement:  Poor  Insight:  Lacking  Psychomotor Activity:  Decreased  Concentration:  Concentration: Poor and Attention Span: Poor  Recall:  Poor  Fund of Knowledge:  Fair  Language:  Fair  Akathisia:  No  Handed:  Right  AIMS (if indicated):     Assets:  Communication Skills Desire for Improvement Financial Resources/Insurance Housing Leisure Time Resilience Social Support  ADL's:  Intact  Cognition:  WNL  Sleep:  Number of Hours: 5.45     Treatment Plan Summary: Daily contact with patient to assess and evaluate symptoms and progress in treatment and Medication management   Greg [name] is a 51 year old male with history of schizoaffective disorder admitted floridly manic in spite of reasonable medication compliance.  1. Mood and psychosis. We will continue monthly Invega sustenna injections next one on 10/24 and Clozapine 200 mg nightly for psychosis and lithium 450 mg bid, and Depakote 750 mg bid for mood stabilization Li 1.0  and VPA level 78 on 05/03/2017.   2. Insomnia. He is sleeping fairly well at night but also naps during the daytime. We'll continue Restoril when necessary.    3. Hypertension. He is on metoprolol. Vital signs  are stable.  4. Diabetes. He is on ADA diet, Lantus and metformin, blood glucose monitoring, SSI. Input from diabetes nurse coordinator is appreciated. Living well with diabetes book ordered.  5. Smoking. Nicotine patch is available.  6. Sleep apnea. He refuses CPAP.  7. Metabolic syndrome monitoring. Lipid panel and TSH are normal, hemoglobin A1c is 10.9.   8. EKG. Normal sinus rhythm, QTc 437.   9. Social. He lives with her elderly parents who no longer feel they can provide care. Placement is necessary. PPD placed.   10. Disposition. He will be discharged to a grouphome on Monday. He will follow up with ACT team.     Jay Schlichter, MD 05/04/2017, 6:13 PM

## 2017-05-04 NOTE — [facility] Group Notes (Signed)
[facility] Group Notes:  (Nursing/MHT/Case Management/Adjunct)  Date:  05/04/2017  Time:  4:55 AM  Type of Therapy:  Psychoeducational Skills  Participation Level:  Did Not Attend  Summary of Progress/Problems:  Greg [name] 05/04/2017, 4:55 AM

## 2017-05-05 NOTE — Plan of Care (Signed)
Problem: Coping: Goal: Ability to cope will improve Outcome: Progressing Patient was observed visiting with his Mother and was calm with an animated affect.  He was pleasant and independent in activities this evening.   Problem: Safety: Goal: Ability to redirect hostility and anger into socially appropriate behaviors will improve Outcome: Progressing Patient is without anger or outbursts thus far this shift.  He is observed to be friendly to peers and staff. Gait is steady and safety is maintained.

## 2017-05-05 NOTE — Plan of Care (Signed)
Problem: Self-Care: Goal: Ability to participate in self-care as condition permits will improve Outcome: Not Progressing Disheveled with notable body odor.

## 2017-05-05 NOTE — [facility] Group Notes (Signed)
[facility] Group Notes:  (Nursing/MHT/Case Management/Adjunct)  Date:  05/05/2017  Time:  9:36 PM  Type of Therapy:  Group Therapy  Participation Level:  Did Not Attend  Aloni Chuang A Jermone Geister 05/05/2017, 9:36 PM

## 2017-05-05 NOTE — Progress Notes (Signed)
[facility] MD Progress Note  05/05/2017 4:35 PM Humphrey Ricke Hey.  MRN:  [id number]  Subjective:   History of present illness. Information was obtained from the patient, the chart, and his mother on the phone. The patient was completed by his mother for escalating symptoms of mania with psychosis. The patient became threatening to his elderly parents, refused to take some of his medications, has not been able to sleep, and has been increasingly agitated. He also states started bizarre behavior with consuming large volumes of salt. He has also been neglectful his medical problems. He stopped taking his insulin and blood pressure medication believing that God cured him. The mother reports that the patient has been only partially compliant with medications, picking and choosing what to take. On admission his Lithium level was therapeutic while Depakote level was low. The patient has been taking oral Invega but he is very skilful to cheek his medication. He was on [place] in the past with good results. It is unclear why Invega injections were stopped. According to the mother, all summer long, the patient has been standing in the throat house naked with a necklace made O pieces of concrete, greeting passersby cars, and protesting against abortion. He lately been rather delusional believing that he is God. He grew his hear over the summer but recently buzz cut some of it.   05/03/2017. Mr. Steinke has no complaints except for slight paranoia that never goes away. He stays in his room most of the time and does not interact with peers or staff. He denies depression, anxiety, suicidal or homicidal thoughts. He met with group home owner and was accepted. Unfortunately, they will not be able to accept him today. There is sedation and drooling from Clozapine.  Anticipated discharge on Monday. Per nursing report, he is compliant with medications. He does not participate in programming.   05/04/17: Paranoid  thoughts overall decreased and he is not endorsing any auditory or visual hallucinations. He has been sleeping most of the morning and missed lunch today. He does not want to go to groups because he finds groups "boring". He denies any depressive symptoms, feelings of hopelessness or severe anxiety. He feels disappointed in himself for having to leave his parents and go to a group home. He is hoping that one day he can return to his parents home. He denies any current active or passive suicidal thoughts or homicidal thoughts. He denies any auditory or visual hallucinations. He has been fairly as attentive to his room and does not interact a lot with staff or peers. He is tolerating psychotropic medications fairly well without any adverse side effects. He denies any problems with insomnia. He denies any somatic complaints. Labs were reviewed with the patient. Vital signs are stable.  05/05/17: The patient has been reclusive to his room most of the day and does not want to socialize with peers. He did not attend any groups. He says he feels sleepy somewhat throughout the daytime because he is bored. He does sleep well at night per nursing slept 6 hours last night. He denies any current active or passive suicidal thoughts. Paranoid thoughts appear to have decreased and he denies any paranoid or delusional thoughts currently. He denies any auditory or visual hallucinations. He has been eating some of his meals but not all meals. The patient is not happy about having to go to a group home but feels like if his behaviors controlled at the group home, there is hope he  can return home to live with his parents. He denies any somatic complaints. Vital signs are stable.    Principal Problem: Schizoaffective disorder, bipolar type ([employer]) Diagnosis:   Patient Active Problem List   Diagnosis Date Noted  . Schizoaffective disorder, bipolar type ([place]) [F25.0] 03/28/2016  . Acute cholecystitis with chronic cholecystitis  [K81.2] 03/27/2016  . Anxiety [F41.9]   . 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 Bipolar 2 disorder ([place]) [F31.81]   . Hyponatremia [E87.1] 10/06/2015  . Essential hypertension [I10] 11/28/2014  . Dyslipidemia [E78.5] 11/28/2014  . Obesity [E66.9] 11/28/2014  . Diabetes ([place]) [E11.9] 11/28/2014  . Tobacco use disorder [F17.200] 11/28/2014   Total Time spent with patient: 20 minutes  Past Psychiatric History: long history of bipolar manic episodes, multiple hospitalizations and medication trials, poor treatment compliance.  Past Medical History:  Past Medical History:  Diagnosis Date  . Bipolar 1 disorder ([place])   . Diabetes ([place])   . Dyslipidemia   . HTN (hypertension)   . Obesity   . Paranoia ([place])   . Schizo affective schizophrenia [facility])     Past Surgical History:  Procedure Laterality Date  . APPENDECTOMY  51 years old  . CHOLECYSTECTOMY N/A 03/30/2016   Procedure: LAPAROSCOPIC CHOLECYSTECTOMY;  Surgeon: Hubbard Robinson, MD;  Location: [facility] ORS;  Service: General;  Laterality: N/A;  . TONSILLECTOMY  51 year old   Family History:  Family History  Problem Relation Age of Onset  . Depression Mother   . Mental illness Mother   . Depression Father   . Mental illness Father   . Depression Sister   . Mental illness Sister    Family Psychiatric  History: grandmother with schizophrenia. Social History:  History  Alcohol Use No     History  Drug Use No    Social History   Social History  . Marital status: Single    Spouse name: N/A  . Number of children: N/A  . Years of education: N/A   Social History Main Topics  . Smoking status: Current Every Day Smoker    Packs/day: 0.50    Years: 8.00    Types: Cigarettes  . Smokeless tobacco: Current User    Types: Snuff  . Alcohol use No  . Drug use: No  . Sexual activity: No   Other Topics Concern  . None   Social History Narrative  . None   Additional Social History: the patient is disabled from  mental illness. He lives with his elderly parents who no longer are able to care for him.        Sleep: Fair; sleeping some during the daytime  Appetite:  Fair  Current Medications: Current Facility-Administered Medications  Medication Dose Route Frequency Provider Last Rate Last Dose  . acetaminophen (TYLENOL) tablet 650 mg  650 mg Oral Q6H PRN [name], Madie Reno, MD   650 mg at 05/05/17 0919  . alum & mag hydroxide-simeth (MAALOX/MYLANTA) 200-200-20 MG/5ML suspension 30 mL  30 mL Oral Q4H PRN [name], [name], MD      . cloZAPine (CLOZARIL) tablet 200 mg  200 mg Oral QHS Pucilowska, Jolanta B, MD   200 mg at 05/04/17 2134  . divalproex (DEPAKOTE) DR tablet 750 mg  750 mg Oral Q12H [name], [name], MD   750 mg at 05/05/17 0917  . hydrOXYzine (ATARAX/VISTARIL) tablet 50 mg  50 mg Oral TID PRN [name], Madie Reno, MD   50 mg at 04/27/17 2250  . insulin glargine (LANTUS)  injection 10 Units  10 Units Subcutaneous Daily [name], Madie Reno, MD   10 Units at 05/04/17 2135  . ipratropium (ATROVENT) 0.06 % nasal spray 2 spray  2 spray Each Nare QHS Pucilowska, Jolanta B, MD   2 spray at 05/04/17 2135  . lithium carbonate (ESKALITH) CR tablet 450 mg  450 mg Oral Q12H [name], Madie Reno, MD   450 mg at 05/05/17 0917  . magnesium hydroxide (MILK OF MAGNESIA) suspension 30 mL  30 mL Oral Daily PRN [name], [name], MD      . metFORMIN (GLUCOPHAGE) tablet 1,000 mg  1,000 mg Oral BID WC [name], Madie Reno, MD   1,000 mg at 05/05/17 0918  . metoprolol succinate (TOPROL-XL) 24 hr tablet 50 mg  50 mg Oral Daily [name], Madie Reno, MD   50 mg at 05/05/17 0918  . nicotine (NICODERM CQ - dosed in mg/24 hours) patch 21 mg  21 mg Transdermal Daily Pucilowska, Jolanta B, MD   21 mg at 05/05/17 0918  . paliperidone (INVEGA SUSTENNA) injection 234 mg  234 mg Intramuscular Q28 days Pucilowska, Jolanta B, MD   234 mg at 04/22/17 1701  . pneumococcal 23 valent vaccine (PNU-IMMUNE) injection 0.5 mL  0.5 mL Intramuscular Tomorrow-1000  Pucilowska, Jolanta B, MD      . temazepam (RESTORIL) capsule 15 mg  15 mg Oral QHS Pucilowska, Jolanta B, MD   15 mg at 05/04/17 2212    Lab Results:  No results found for this or any previous visit (from the past 48 hour(s)).  Blood Alcohol level:  Lab Results  Component Value Date   ETH <5 04/14/2017   ETH <5 34/74/2595    Metabolic Disorder Labs: Lab Results  Component Value Date   HGBA1C 10.9 (H) 04/17/2017   MPG 266.13 04/17/2017   MPG 154 02/09/2015   Lab Results  Component Value Date   PROLACTIN 43.3 (H) 04/17/2017   PROLACTIN 45.2 (H) 10/07/2015   Lab Results  Component Value Date   CHOL 103 04/17/2017   TRIG 110 04/17/2017   HDL 34 (L) 04/17/2017   CHOLHDL 3.0 04/17/2017   VLDL 22 04/17/2017   LDLCALC 47 04/17/2017   LDLCALC 44 10/07/2015     Musculoskeletal: Strength & Muscle Tone: within normal limits Gait & Station: normal Patient leans: N/A  Psychiatric Specialty Exam: Physical Exam  Nursing note and vitals reviewed. Genitourinary: Rectum normal.  Psychiatric: He has a normal mood and affect. His speech is normal and behavior is normal. Thought content is paranoid. Cognition and memory are normal. He expresses impulsivity.    Review of Systems  Constitutional: Negative.   HENT: Negative.        Drooling  Eyes: Negative.   Respiratory: Negative.   Cardiovascular: Negative.   Gastrointestinal: Negative.   Genitourinary: Negative.   Musculoskeletal: Negative.   Skin: Negative.   Neurological: Negative.   Endo/Heme/Allergies: Negative.   Psychiatric/Behavioral:       Paranoia    Blood pressure 112/69, pulse 95, temperature 98.5 F (36.9 C), temperature source Oral, resp. rate 12, height _0  (1.753 m), weight 111.6 kg (246 lb), SpO2 99 %.Body mass index is 36.33 kg/m.  General Appearance: Disheveled  Eye Contact:  Good  Speech:  Clear and Coherent and Normal Rate  Volume:  Normal  Mood:  "OK"  Affect:  Flat  Thought Process:  Goal  Directed and Descriptions of Associations: Intact  Orientation:  Full (Time, Place, and Person)  Thought Content:  Paranoid  Ideation  has decreased  Suicidal Thoughts:  No  Homicidal Thoughts:  No  Memory:  Immediate;   Fair Recent;   Fair Remote;   Fair  Judgement:  Poor  Insight:  Lacking  Psychomotor Activity:  Decreased  Concentration:  Concentration: Poor and Attention Span: Poor  Recall:  Poor  Fund of Knowledge:  Fair  Language:  Fair  Akathisia:  No  Handed:  Right  AIMS (if indicated):     Assets:  Communication Skills Desire for Improvement Financial Resources/Insurance Housing Leisure Time Resilience Social Support  ADL's:  Intact  Cognition:  WNL  Sleep:  Number of Hours: 6     Treatment Plan Summary: Daily contact with patient to assess and evaluate symptoms and progress in treatment and Medication management   Mr. Favor is a 51 year old male with history of schizoaffective disorder admitted floridly manic in spite of reasonable medication compliance.  1. Mood and psychosis. We will continue monthly Invega sustenna injections next one on 10/24 and Clozapine 200 mg nightly for psychosis and lithium 450 mg bid, and Depakote 750 mg bid for mood stabilization Li 1.0  and VPA level 78 on 05/03/2017.   2. Insomnia. He is sleeping fairly well at night but also naps during the daytime. We'll continue Restoril when necessary.    3. Hypertension. He is on metoprolol. Vital signs are stable.  4. Diabetes. He is on ADA diet, Lantus and metformin, blood glucose monitoring, SSI. Input from diabetes nurse coordinator is appreciated. Living well with diabetes book ordered. BS are controlled.  5. Smoking. Nicotine patch is available.  6. Sleep apnea. He refuses CPAP.  7. Metabolic syndrome monitoring. Lipid panel and TSH are normal, hemoglobin A1c is 10.9.   8. EKG. Normal sinus rhythm, QTc 437.   9. Social. He lives with her elderly parents who no longer  feel they can provide care. Placement is necessary. PPD placed.   10. Disposition. He will be discharged to a grouphome on Monday. He will follow up with ACT team.     Jay Schlichter, MD 05/05/2017, 4:35 PM

## 2017-05-05 NOTE — Progress Notes (Signed)
Denies SI/HI/AVH.  Pleasant and cooperative. Stays to self.  Minimal interaction with peers.  No group attendance.  Medication compliant.  Support and encouragement offered.  Safety maintained.

## 2017-05-05 NOTE — [facility] Group Notes (Signed)
[facility] LCSW Group Therapy 05/05/2017 1:15pm  Type of Therapy: Group Therapy- Feelings Around Discharge & Establishing a Supportive Framework  Participation Level:  Did Not Attend  Description of Group:   What is a supportive framework? What does it look like feel like and how do I discern it from and unhealthy non-supportive network? Learn how to cope when supports are not helpful and don't support you. Discuss what to do when your family/friends are not supportive.  Therapeutic Modalities:   Cognitive Behavioral Therapy Person-Centered Therapy Motivational Interviewing   Verdene Lennert, LCSW 05/05/2017 12:58 PM

## 2017-05-05 NOTE — [facility] Group Notes (Signed)
[facility] Group Notes:  (Nursing/MHT/Case Management/Adjunct)  Date:  05/05/2017  Time:  5:03 AM  Type of Therapy:  Psychoeducational Skills  Participation Level:  Did Not Attend  Summary of Progress/Problems:  Greg [name] 05/05/2017, 5:03 AM

## 2017-05-05 NOTE — Plan of Care (Signed)
Problem: Safety: Goal: Ability to remain free from injury will improve Outcome: Progressing Remains safe on the unit   

## 2017-05-05 NOTE — Plan of Care (Signed)
Problem: Activity: Goal: Sleeping patterns will improve Outcome: Not Progressing Patient sleeps majority of the day hours.

## 2017-05-06 LAB — GLUCOSE, CAPILLARY: Glucose-Capillary: 125 mg/dL — ABNORMAL HIGH (ref 65–99)

## 2017-05-06 MED ORDER — TEMAZEPAM 15 MG PO CAPS: 15.0000 mg | ORAL_CAPSULE | Freq: Every day | ORAL | 0 refills | Status: DC

## 2017-05-06 MED ORDER — CLOZAPINE 200 MG PO TABS: 200.0000 mg | ORAL_TABLET | Freq: Every day | ORAL | 1 refills | Status: DC

## 2017-05-06 MED ORDER — METFORMIN HCL 1000 MG PO TABS: 1000.0000 mg | ORAL_TABLET | Freq: Two times a day (BID) | ORAL | 1 refills | Status: DC

## 2017-05-06 MED ORDER — INSULIN GLARGINE 100 UNIT/ML ~~LOC~~ SOLN: 10.0000 [IU] | Freq: Every day | SUBCUTANEOUS | 1 refills | Status: DC

## 2017-05-06 MED ORDER — METOPROLOL SUCCINATE ER 50 MG PO TB24: 50.0000 mg | ORAL_TABLET | Freq: Every day | ORAL | 1 refills | Status: DC

## 2017-05-06 MED ORDER — PALIPERIDONE PALMITATE 234 MG/1.5ML IM SUSP: 234.0000 mg | INTRAMUSCULAR | 1 refills | Status: DC

## 2017-05-06 MED ORDER — IPRATROPIUM BROMIDE 0.06 % NA SOLN: 2.0000 | Freq: Every day | NASAL | 12 refills | Status: DC

## 2017-05-06 MED ORDER — DIVALPROEX SODIUM 250 MG PO DR TAB: 750.0000 mg | DELAYED_RELEASE_TABLET | Freq: Two times a day (BID) | ORAL | 1 refills | Status: AC

## 2017-05-06 MED ORDER — LITHIUM CARBONATE ER 450 MG PO TBCR: 450.0000 mg | EXTENDED_RELEASE_TABLET | Freq: Two times a day (BID) | ORAL | 1 refills | Status: DC

## 2017-05-06 NOTE — Discharge Summary (Signed)
Physician Discharge Summary Note  Patient:  Greg [name]. is an 51 y.o., male MRN:  [id number] DOB:  18-Jun-1966 Patient phone:  [phone] (home)  Patient address:   [address] [place] [address],  Total Time spent with patient: 30 minutes  Date of Admission:  04/16/2017 Date of Discharge: 05/06/2017  Reason for Admission:  Psychotic break.  Identifying data. Greg [name] is a 51 year old male with a history of schizoaffective disorder.  Chief complaint. "I have been eating a lot of salt."  History of present illness. Information was obtained from the patient, the chart, and his mother on the phone. The patient was completed by his mother for escalating symptoms of mania with psychosis. The patient became threatening to his elderly parents, refused to take some of his medications, has not been able to sleep, and has been increasingly agitated. He also states started bizarre behavior with consuming large volumes of salt. He has also been neglectful his medical problems. He stopped taking his insulin and blood pressure medication believing that God cured him. The mother reports that the patient has been only partially compliant with medications, picking and choosing what to take. On admission his Lithium level was therapeutic while Depakote level was low. The patient has been taking oral Invega but he is very skilful to cheek his medication. He was on [place] in the past with good results. It is unclear why Invega injections were stopped. According to the mother, all summer long, the patient has been standing in the throat house naked with a necklace made O pieces of concrete, greeting passersby cars, and protesting against abortion. He lately been rather delusional believing that he is God. He grew his hear over the summer but recently buzz cut some of it.   The patient himself admits that he is more manic than usual. He believes that he is taking salt to  prevent tremors. He is very disorganized during treatment team meeting and hard to understand. His speech is pressured and loud. He is giddy and intrusive.  Past psychiatric history. There is a long history of bipolar illness or schizoaffective disorder with multiple psychiatric admissions and multiple treatment trials. He did exceedingly well on lithium initially. He did relatively well on [place] injections. He has never been on Clozaril. He mostly presents with mania but the mother remembers one serious depressive episode.  Family psychiatric history. Grandmother with schizophrenia.  Social history. He dropped out of high school but got his GED and went to college. He is divorced. He is disabled from mental illness. He lives with his elderly parents who no longer believe they can provide care.   Principal Problem: Schizoaffective disorder, bipolar type [facility]) Discharge Diagnoses: Patient Active Problem List   Diagnosis Date Noted  . Schizoaffective disorder, bipolar type (HCC) [F25.0] 03/28/2016  . Acute cholecystitis with chronic cholecystitis [K81.2] 03/27/2016  . Anxiety [F41.9]   . 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 Bipolar 2 disorder (HCC) [F31.81]   . Hyponatremia [E87.1] 10/06/2015  . Essential hypertension [I10] 11/28/2014  . Dyslipidemia [E78.5] 11/28/2014  . Obesity [E66.9] 11/28/2014  . Diabetes (HCC) [E11.9] 11/28/2014  . Tobacco use disorder [F17.200] 11/28/2014     Past Medical History:  Past Medical History:  Diagnosis Date  . Bipolar 1 disorder (HCC)   . Diabetes (HCC)   . Dyslipidemia   . HTN (hypertension)   . Obesity   . Paranoia (HCC)   . Schizo affective schizophrenia (HCC)  Past Surgical History:  Procedure Laterality Date  . APPENDECTOMY  51 years old  . CHOLECYSTECTOMY N/A 03/30/2016   Procedure: LAPAROSCOPIC CHOLECYSTECTOMY;  Surgeon: Gladis Riffle, MD;  Location: [facility] ORS;  Service: General;  Laterality: N/A;  .  TONSILLECTOMY  51 year old   Family History:  Family History  Problem Relation Age of Onset  . Depression Mother   . Mental illness Mother   . Depression Father   . Mental illness Father   . Depression Sister   . Mental illness Sister    Social History:  History  Alcohol Use No     History  Drug Use No    Social History   Social History  . Marital status: Single    Spouse name: N/A  . Number of children: N/A  . Years of education: N/A   Social History Main Topics  . Smoking status: Current Every Day Smoker    Packs/day: 0.50    Years: 8.00    Types: Cigarettes  . Smokeless tobacco: Current User    Types: Snuff  . Alcohol use No  . Drug use: No  . Sexual activity: No   Other Topics Concern  . None   Social History Narrative  . None    Hospital Course:    Greg [name] is a 51 year old male with history of schizoaffective disorder admitted floridly manic in spite of reasonable medication compliance.  1. Mood and psychosis. The patient was maintained on oral Invega in the community. He agreed to [place] injections. Next injection is due on 10/24. We also added clozapine 200 mg for psychosis. We were unable to further increase the dose due to sedation. The patient seems to tolerate the current dose well at the time of discharge. He was continued on lithium 450 mg twice daily and Depakote 750 mg twice daily for mood stabilization. Lithium level I.0, Depakote level 78.    2. Insomnia. Restoril is available.     3. Hypertension. We continued metoprolol.  4. Diabetes. We continued Lantus, metformin, ADA diet. The patient was noncompliant with treatment in the community. Living well with diabetes book was provided.   5. Smoking. Nicotine patch was available.   6. Sleep apnea. The patient continued to refuse to use CPAP.   7. Metabolic syndrome monitoring. Lipid panel and TSH are normal. Hemoglobin A1c 10.9.    8. EKG. normal sinus rhythm, QTC 437.     9. Disposition. At the patient no longer making lives with his elderly parents and was placed in a group home. He will follow up with act team.    Physical Findings: AIMS:  , ,  ,  ,    CIWA:    COWS:     Musculoskeletal: Strength & Muscle Tone: within normal limits Gait & Station: normal Patient leans: N/A  Psychiatric Specialty Exam: Physical Exam  Nursing note and vitals reviewed. Psychiatric: He has a normal mood and affect. His speech is normal and behavior is normal. Cognition and memory are normal. He expresses impulsivity.    Review of Systems  Neurological: Negative.   Psychiatric/Behavioral: Negative.   All other systems reviewed and are negative.   Blood pressure 124/79, pulse 90, temperature 98.8 F (37.1 C), temperature source Oral, resp. rate 20, height  (1.753 m), weight 111.6 kg (246 lb), SpO2 100 %.Body mass index is 36.33 kg/m.  General Appearance: Casual  Eye Contact:  Good  Speech:  Clear and Coherent  Volume:  Normal  Mood:  Euthymic  Affect:  Appropriate  Thought Process:  Goal Directed and Descriptions of Associations: Intact  Orientation:  Full (Time, Place, and Person)  Thought Content:  WDL  Suicidal Thoughts:  No  Homicidal Thoughts:  No  Memory:  Immediate;   Fair Recent;   Fair Remote;   Fair  Judgement:  Poor  Insight:  Shallow  Psychomotor Activity:  Normal  Concentration:  Concentration: Fair and Attention Span: Fair  Recall:  [employer] of Knowledge:  Fair  Language:  Fair  Akathisia:  No  Handed:  Right  AIMS (if indicated):     Assets:  Communication Skills Desire for Improvement Financial Resources/Insurance Housing Physical Health Resilience Social Support  ADL's:  Intact  Cognition:  WNL  Sleep:  Number of Hours: 6.15     Have you used any form of tobacco in the last 30 days? (Cigarettes, Smokeless Tobacco, Cigars, and/or Pipes): Yes  Has this patient used any form of tobacco in the last 30 days?  (Cigarettes, Smokeless Tobacco, Cigars, and/or Pipes) Yes, Yes, A prescription for an FDA-approved tobacco cessation medication was offered at discharge and the patient refused  Blood Alcohol level:  Lab Results  Component Value Date   [facility] <5 04/14/2017   ETH <5 10/06/2015    Metabolic Disorder Labs:  Lab Results  Component Value Date   HGBA1C 10.9 (H) 04/17/2017   MPG 266.13 04/17/2017   MPG 154 02/09/2015   Lab Results  Component Value Date   PROLACTIN 43.3 (H) 04/17/2017   PROLACTIN 45.2 (H) 10/07/2015   Lab Results  Component Value Date   CHOL 103 04/17/2017   TRIG 110 04/17/2017   HDL 34 (L) 04/17/2017   CHOLHDL 3.0 04/17/2017   VLDL 22 04/17/2017   LDLCALC 47 04/17/2017   LDLCALC 44 10/07/2015    See Psychiatric Specialty Exam and Suicide Risk Assessment completed by Attending Physician prior to discharge.  Discharge destination:  Other:  group home.  Is patient on multiple antipsychotic therapies at discharge:  Yes,   Do you recommend tapering to monotherapy for antipsychotics?  No   Has Patient had three or more failed trials of antipsychotic monotherapy by history:  Yes,   Antipsychotic medications that previously failed include:   1.  Zyprexa., 2.  Risperdal. and 3.  Haldol.  Recommended Plan for Multiple Antipsychotic Therapies: Second antipsychotic is Clozapine.  Reason for adding Clozapine inadequate response to a single agent.  Discharge Instructions    Diet - low sodium heart healthy    Complete by:  As directed    Increase activity slowly    Complete by:  As directed      Allergies as of 05/06/2017      Reactions   Haldol [haloperidol] Anaphylaxis   Seroquel [quetiapine Fumarate] Other (See Comments)   Reaction: morbid, depressed      Medication List    STOP taking these medications   amantadine 100 MG capsule Commonly known as:  SYMMETREL   benztropine 2 MG tablet Commonly known as:  COGENTIN   propranolol 20 MG tablet Commonly known  as:  INDERAL     TAKE these medications     Indication  clozapine 200 MG tablet Commonly known as:  CLOZARIL Take 1 tablet (200 mg total) by mouth at bedtime.  Indication:  Schizophrenia that does Not Respond to Usual Drug Therapy   divalproex 250 MG DR tablet Commonly known as:  DEPAKOTE Take 3 tablets (750 mg total) by  mouth every 12 (twelve) hours.  Indication:  Manic Phase of Manic-Depression   insulin glargine 100 UNIT/ML injection Commonly known as:  LANTUS Inject 0.1 mLs (10 Units total) into the skin daily.  Indication:  Type 2 Diabetes   ipratropium 0.06 % nasal spray Commonly known as:  ATROVENT Place 2 sprays into both nostrils at bedtime. Apply under tha tongue for drooling.  Indication:  drooling   lithium carbonate 450 MG CR tablet Commonly known as:  ESKALITH Take 1 tablet (450 mg total) by mouth 2 (two) times daily. What changed:  Another medication with the same name was removed. Continue taking this medication, and follow the directions you see here.  Indication:  Mania   metFORMIN 1000 MG tablet Commonly known as:  GLUCOPHAGE Take 1 tablet (1,000 mg total) by mouth 2 (two) times daily with a meal.  Indication:  Antipsychotic Therapy-Induced Weight Gain, Type 2 Diabetes   metoprolol succinate 50 MG 24 hr tablet Commonly known as:  TOPROL-XL Take 1 tablet (50 mg total) by mouth daily. Take with or immediately following a meal.  Indication:  High Blood Pressure Disorder   paliperidone 234 MG/1.5ML Susp injection Commonly known as:  INVEGA SUSTENNA Inject 234 mg into the muscle every 28 (twenty-eight) days. Next dose on 05/20/2017.  Indication:  Schizophrenia   temazepam 15 MG capsule Commonly known as:  RESTORIL Take 1 capsule (15 mg total) by mouth at bedtime.  Indication:  Trouble Sleeping      Follow-up [facility] Follow up.   Contact information: [address] [place] [address] [phone]            Follow-up recommendations:  Activity:  As tolerated. Diet:  Low sodium heart healthy ADA diet. Other:  Keep follow-up appointments.  Comments:    Signed: Kristine Linea, MD 05/06/2017, 9:19 AM

## 2017-05-06 NOTE — [facility] Group Notes (Signed)
[facility] Group Notes:  (Nursing/MHT/Case Management/Adjunct)  Date:  05/06/2017  Time:  4:36 PM  Type of Therapy:  Psychoeducational Skills  Participation Level:  Did Not Attend  Lynelle Smoke [facility] 05/06/2017, 4:36 PM

## 2017-05-06 NOTE — Progress Notes (Signed)
D:Pt denies SI/HI/AVH, affect is flat but brightens upon approach. Pt is pleasant and cooperative, patient thoughts are organized and coherent no bizarre behavior or confusion to place or situation. Patient appears less anxious and he is interacting with peers and staff appropriately. Patient is aware that he is scheduled to be be discharged to a group home tomorrow.   A:Pt was offered support and encouragement,  Pt was given scheduled medications. Pt was encouraged to attend groups. Q 15 minute checks were done for safety.  R:Pt did attend evening group, he was appropriate with peers and staff, and he is compliant with medication, Pt has no complaints, and he is receptive to treatment,  15 minutes safety checks maintained on unit.

## 2017-05-06 NOTE — Progress Notes (Signed)
[facility] MD Progress Note  05/06/2017 10:50 PM Greg [name].  MRN:  [id number]  Subjective:   History of present illness. Information was obtained from the patient, the chart, and his mother on the phone. The patient was completed by his mother for escalating symptoms of mania with psychosis. The patient became threatening to his elderly parents, refused to take some of his medications, has not been able to sleep, and has been increasingly agitated. He also states started bizarre behavior with consuming large volumes of salt. He has also been neglectful his medical problems. He stopped taking his insulin and blood pressure medication believing that God cured him. The mother reports that the patient has been only partially compliant with medications, picking and choosing what to take. On admission his Lithium level was therapeutic while Depakote level was low. The patient has been taking oral Invega but he is very skilful to cheek his medication. He was on [place] in the past with good results. It is unclear why Invega injections were stopped. According to the mother, all summer long, the patient has been standing in the throat house naked with a necklace made O pieces of concrete, greeting passersby cars, and protesting against abortion. He lately been rather delusional believing that he is God. He grew his hear over the summer but recently buzz cut some of it.   05/06/2017. We were hoping that the patient would be discharged to new group home today. They will not be able to do so until the patient is enrolled in day program.This means that he will not be able to work with ACT team due to [facility] funding. SW is making arrangements. The patient denies any symptoms of depression, anxiety or psychosis. He feels "fine". Sleep and appetite are good. Daytime somnolence has resolved. He takes medications and reports no side effects.   Principal Problem: Schizoaffective disorder, bipolar type  (HCC) Diagnosis:   Patient Active Problem List   Diagnosis Date Noted  . Schizoaffective disorder, bipolar type (HCC) [F25.0] 03/28/2016  . Acute cholecystitis with chronic cholecystitis [K81.2] 03/27/2016  . Anxiety [F41.9]   . 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 Bipolar 2 disorder (HCC) [F31.81]   . Hyponatremia [E87.1] 10/06/2015  . Essential hypertension [I10] 11/28/2014  . Dyslipidemia [E78.5] 11/28/2014  . Obesity [E66.9] 11/28/2014  . Diabetes (HCC) [E11.9] 11/28/2014  . Tobacco use disorder [F17.200] 11/28/2014   Total Time spent with patient: 20 minutes  Past Psychiatric History: long history of bipolar manic episodes, multiple hospitalizations and medication trials, poor treatment compliance.  Past Medical History:  Past Medical History:  Diagnosis Date  . Bipolar 1 disorder (HCC)   . Diabetes (HCC)   . Dyslipidemia   . HTN (hypertension)   . Obesity   . Paranoia (HCC)   . Schizo affective schizophrenia [facility])     Past Surgical History:  Procedure Laterality Date  . APPENDECTOMY  51 years old  . CHOLECYSTECTOMY N/A 03/30/2016   Procedure: LAPAROSCOPIC CHOLECYSTECTOMY;  Surgeon: Gladis Riffle, MD;  Location: [facility] ORS;  Service: General;  Laterality: N/A;  . TONSILLECTOMY  51 year old   Family History:  Family History  Problem Relation Age of Onset  . Depression Mother   . Mental illness Mother   . Depression Father   . Mental illness Father   . Depression Sister   . Mental illness Sister    Family Psychiatric  History: grandmother with schizophrenia. Social History:  History  Alcohol Use No  History  Drug Use No    Social History   Social History  . Marital status: Single    Spouse name: N/A  . Number of children: N/A  . Years of education: N/A   Social History Main Topics  . Smoking status: Current Every Day Smoker    Packs/day: 0.50    Years: 8.00    Types: Cigarettes  . Smokeless tobacco: Current User    Types: Snuff  .  Alcohol use No  . Drug use: No  . Sexual activity: No   Other Topics Concern  . None   Social History Narrative  . None   Additional Social History: the patient is disabled from mental illness. He lives with his elderly parents who no longer are able to care for him.        Sleep: Fair  Appetite:  Fair  Current Medications: Current Facility-Administered Medications  Medication Dose Route Frequency Provider Last Rate Last Dose  . acetaminophen (TYLENOL) tablet 650 mg  650 mg Oral Q6H PRN [name], [name], MD   650 mg at 05/05/17 0919  . alum & mag hydroxide-simeth (MAALOX/MYLANTA) 200-200-20 MG/5ML suspension 30 mL  30 mL Oral Q4H PRN [name], [name], MD      . cloZAPine (CLOZARIL) tablet 200 mg  200 mg Oral QHS Breeley Bischof B, MD   200 mg at 05/06/17 2153  . divalproex (DEPAKOTE) DR tablet 750 mg  750 mg Oral Q12H [name], [name], MD   750 mg at 05/06/17 2100  . hydrOXYzine (ATARAX/VISTARIL) tablet 50 mg  50 mg Oral TID PRN [name], [name], MD   50 mg at 04/27/17 2250  . insulin glargine (LANTUS) injection 10 Units  10 Units Subcutaneous Daily [name], [name], MD   10 Units at 05/06/17 2150  . ipratropium (ATROVENT) 0.06 % nasal spray 2 spray  2 spray Each Nare QHS Elim Economou B, MD   2 spray at 05/06/17 2154  . lithium carbonate (ESKALITH) CR tablet 450 mg  450 mg Oral Q12H [name], [name], MD   450 mg at 05/06/17 2100  . magnesium hydroxide (MILK OF MAGNESIA) suspension 30 mL  30 mL Oral Daily PRN [name], [name], MD      . metFORMIN (GLUCOPHAGE) tablet 1,000 mg  1,000 mg Oral BID WC [name], [name], MD   1,000 mg at 05/06/17 1800  . metoprolol succinate (TOPROL-XL) 24 hr tablet 50 mg  50 mg Oral Daily [name], [name], MD   50 mg at 05/06/17 0853  . nicotine (NICODERM CQ - dosed in mg/24 hours) patch 21 mg  21 mg Transdermal Daily Jalissa Heinzelman B, MD   21 mg at 05/06/17 1213  . paliperidone (INVEGA SUSTENNA) injection 234 mg  234 mg Intramuscular Q28 days  Stevana Dufner B, MD   234 mg at 04/22/17 1701  . pneumococcal 23 valent vaccine (PNU-IMMUNE) injection 0.5 mL  0.5 mL Intramuscular Tomorrow-1000 Carlo Lorson B, MD      . temazepam (RESTORIL) capsule 15 mg  15 mg Oral QHS Inetha Maret B, MD   15 mg at 05/06/17 2153    Lab Results:  Results for orders placed or performed during the hospital encounter of 04/16/17 (from the past 48 hour(s))  Glucose, capillary     Status: Abnormal   Collection Time: 05/06/17  8:19 PM  Result Value Ref Range   Glucose-Capillary 125 (H) 65 - 99 mg/dL   Comment 1 Notify RN  Blood Alcohol level:  Lab Results  Component Value Date   ETH <5 04/14/2017   ETH <5 10/06/2015    Metabolic Disorder Labs: Lab Results  Component Value Date   HGBA1C 10.9 (H) 04/17/2017   MPG 266.13 04/17/2017   MPG 154 02/09/2015   Lab Results  Component Value Date   PROLACTIN 43.3 (H) 04/17/2017   PROLACTIN 45.2 (H) 10/07/2015   Lab Results  Component Value Date   CHOL 103 04/17/2017   TRIG 110 04/17/2017   HDL 34 (L) 04/17/2017   CHOLHDL 3.0 04/17/2017   VLDL 22 04/17/2017   LDLCALC 47 04/17/2017   LDLCALC 44 10/07/2015     Musculoskeletal: Strength & Muscle Tone: within normal limits Gait & Station: normal Patient leans: N/A  Psychiatric Specialty Exam: Physical Exam  Nursing note and vitals reviewed. Genitourinary: Rectum normal.  Psychiatric: He has a normal mood and affect. His speech is normal and behavior is normal. Judgment and thought content normal. Cognition and memory are normal.    Review of Systems  Neurological: Negative.   Psychiatric/Behavioral: Negative.   All other systems reviewed and are negative.   Blood pressure 124/79, pulse 90, temperature 98.8 F (37.1 C), temperature source Oral, resp. rate 20, height  (1.753 m), weight 111.6 kg (246 lb), SpO2 100 %.Body mass index is 36.33 kg/m.  General Appearance: Fairly Groomed  Eye Contact:  Good  Speech:   Clear and Coherent  Volume:  Normal  Mood:  "OK"  Affect:  Flat  Thought Process:  Goal Directed and Descriptions of Associations: Intact  Orientation:  Full (Time, Place, and Person)  Thought Content:  WDL    Suicidal Thoughts:  No  Homicidal Thoughts:  No  Memory:  Immediate;   Fair Recent;   Fair Remote;   Fair  Judgement:  Poor  Insight:  Shallow  Psychomotor Activity:  Decreased  Concentration:  Concentration: Fair and Attention Span: Fair  Recall:  [employer] of Knowledge:  Fair  Language:  Fair  Akathisia:  No  Handed:  Right  AIMS (if indicated):     Assets:  Communication Skills Desire for Improvement Financial Resources/Insurance Housing Leisure Time Physical Health Resilience Social Support  ADL's:  Intact  Cognition:  WNL  Sleep:  Number of Hours: 6.15     Treatment Plan Summary: Daily contact with patient to assess and evaluate symptoms and progress in treatment and Medication management   Greg [name] is a 51 year old male with history of schizoaffective disorder admitted floridly manic in spite of reasonable medication compliance.  1. Mood and psychosis. We will continue monthly Invega sustenna injections next one on 10/24 and Clozapine 200 mg nightly for psychosis and lithium 450 mg bid, and Depakote 750 mg bid for mood stabilization Li 1.0  and VPA level 78 on 05/03/2017.   2. Insomnia. He is sleeping fairly well at night but also naps during the daytime. We'll continue Restoril when necessary.    3. Hypertension. He is on metoprolol. Vital signs are stable.  4. Diabetes. He is on ADA diet, Lantus and metformin, blood glucose monitoring, SSI. Input from diabetes nurse coordinator is appreciated. Living well with diabetes book ordered. BS are controlled.  5. Smoking. Nicotine patch is available.  6. Sleep apnea. He refuses CPAP.  7. Metabolic syndrome monitoring. Lipid panel and TSH are normal, hemoglobin A1c is 10.9.   8. EKG. Normal  sinus rhythm, QTc 437.   9. Social. He lives with her elderly parents  who no longer feel they can provide care. Placement is necessary. PPD placed.   10. Disposition. He will be discharged to a grouphome on Monday. He will follow up with ACT team.     Kristine Linea, MD 05/06/2017, 10:50 PM

## 2017-05-06 NOTE — Plan of Care (Signed)
Problem: Safety: Goal: Periods of time without injury will increase Outcome: Progressing Patient is observed walking in the halls and continues to interact with peers minimally,  but in a pleasant manner. He visited with his Mother and later stated, "My Mother wants me to go to a group home".  He became mildly tense but when asked for his opinion on this, he agreed "it is probably best".    He attempted to name what is positive about living in a group home and revealed concern for his Mother as well as himself.  Problem: Coping: Goal: Ability to cope will improve Outcome: Progressing Patient denies SI/HI/AV/H and contracts for safety.

## 2017-05-06 NOTE — BHH Suicide Risk Assessment (Signed)
[facility] Discharge Suicide Risk Assessment   Principal Problem: Schizoaffective disorder, bipolar type [facility]) Discharge Diagnoses:  Patient Active Problem List   Diagnosis Date Noted  . Schizoaffective disorder, bipolar type (HCC) [F25.0] 03/28/2016  . Acute cholecystitis with chronic cholecystitis [K81.2] 03/27/2016  . Anxiety [F41.9]   . 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 Bipolar 2 disorder (HCC) [F31.81]   . Hyponatremia [E87.1] 10/06/2015  . Essential hypertension [I10] 11/28/2014  . Dyslipidemia [E78.5] 11/28/2014  . Obesity [E66.9] 11/28/2014  . Diabetes (HCC) [E11.9] 11/28/2014  . Tobacco use disorder [F17.200] 11/28/2014    Total Time spent with patient: 30 minutes  Musculoskeletal: Strength & Muscle Tone: within normal limits Gait & Station: normal Patient leans: N/A  Psychiatric Specialty Exam: Review of Systems  Neurological: Negative.   Psychiatric/Behavioral: Negative.   All other systems reviewed and are negative.   Blood pressure 127/69, pulse 92, temperature 97.7 F (36.5 C), temperature source Oral, resp. rate 16, height  (1.753 m), weight 111.6 kg (246 lb), SpO2 100 %.Body mass index is 36.33 kg/m.  General Appearance: Casual  Eye Contact::  Good  Speech:  Clear and Coherent409  Volume:  Normal  Mood:  Anxious  Affect:  Blunt  Thought Process:  Goal Directed and Descriptions of Associations: Intact  Orientation:  Full (Time, Place, and Person)  Thought Content:  WDL  Suicidal Thoughts:  No  Homicidal Thoughts:  No  Memory:  Immediate;   Fair Recent;   Fair Remote;   Fair  Judgement:  Impaired  Insight:  Shallow  Psychomotor Activity:  Normal  Concentration:  Fair  Recall:  [employer] of Knowledge:Fair  Language: Fair  Akathisia:  No  Handed:  Right  AIMS (if indicated):     Assets:  Communication Skills Desire for Improvement Financial Resources/Insurance Housing Physical Health Resilience Social Support  Sleep:  Number of  Hours: 6.15  Cognition: WNL  ADL's:  Intact   Mental Status Per Nursing Assessment::   On Admission:     Demographic Factors:  Male, Divorced or widowed and Caucasian  Loss Factors: NA  Historical Factors: Prior suicide attempts, Family history of mental illness or substance abuse and Impulsivity  Risk Reduction Factors:   Sense of responsibility to family and Positive social support  Continued Clinical Symptoms:  Schizophrenia:   Paranoid or undifferentiated type  Cognitive Features That Contribute To Risk:  None    Suicide Risk:  Minimal: No identifiable suicidal ideation.  Patients presenting with no risk factors but with morbid ruminations; may be classified as minimal risk based on the severity of the depressive symptoms  Follow-up Information    [facility] Follow up.   Contact information: [address] [place] [address] [phone]           Plan Of Care/Follow-up recommendations:  Activity:  as tolerated. Diet:  low sodium heart healthy ADA diet. Other:  keep follow up appointments.  Kristine Linea, MD 05/06/2017, 8:10 AM

## 2017-05-06 NOTE — Tx Team (Signed)
Interdisciplinary Treatment and Diagnostic Plan Update  05/06/2017 Time of Session: 1112am Greg [name]. MRN: [id number]  Principal Diagnosis: Schizoaffective disorder, bipolar type (HCC)  Secondary Diagnoses: Principal Problem:   Schizoaffective disorder, bipolar type (HCC) Active Problems:   Essential hypertension   Obesity   Diabetes (HCC)   Tobacco use disorder   Current Medications:  Current Facility-Administered Medications  Medication Dose Route Frequency Provider Last Rate Last Dose  . acetaminophen (TYLENOL) tablet 650 mg  650 mg Oral Q6H PRN [name], [name], MD   650 mg at 05/05/17 0919  . alum & mag hydroxide-simeth (MAALOX/MYLANTA) 200-200-20 MG/5ML suspension 30 mL  30 mL Oral Q4H PRN [name], [name], MD      . cloZAPine (CLOZARIL) tablet 200 mg  200 mg Oral QHS Pucilowska, Jolanta B, MD   200 mg at 05/05/17 2147  . divalproex (DEPAKOTE) DR tablet 750 mg  750 mg Oral Q12H [name], [name], MD   750 mg at 05/06/17 0853  . hydrOXYzine (ATARAX/VISTARIL) tablet 50 mg  50 mg Oral TID PRN [name], [name], MD   50 mg at 04/27/17 2250  . insulin glargine (LANTUS) injection 10 Units  10 Units Subcutaneous Daily [name], [name], MD   10 Units at 05/05/17 2152  . ipratropium (ATROVENT) 0.06 % nasal spray 2 spray  2 spray Each Nare QHS Pucilowska, Jolanta B, MD   2 spray at 05/05/17 2148  . lithium carbonate (ESKALITH) CR tablet 450 mg  450 mg Oral Q12H [name], [name], MD   450 mg at 05/06/17 0853  . magnesium hydroxide (MILK OF MAGNESIA) suspension 30 mL  30 mL Oral Daily PRN [name], [name], MD      . metFORMIN (GLUCOPHAGE) tablet 1,000 mg  1,000 mg Oral BID WC [name], [name], MD   1,000 mg at 05/06/17 0853  . metoprolol succinate (TOPROL-XL) 24 hr tablet 50 mg  50 mg Oral Daily [name], [name], MD   50 mg at 05/06/17 0853  . nicotine (NICODERM CQ - dosed in mg/24 hours) patch 21 mg  21 mg Transdermal Daily Pucilowska, Jolanta B, MD   21 mg at 05/06/17 1213  . paliperidone  (INVEGA SUSTENNA) injection 234 mg  234 mg Intramuscular Q28 days Pucilowska, Jolanta B, MD   234 mg at 04/22/17 1701  . pneumococcal 23 valent vaccine (PNU-IMMUNE) injection 0.5 mL  0.5 mL Intramuscular Tomorrow-1000 Pucilowska, Jolanta B, MD      . temazepam (RESTORIL) capsule 15 mg  15 mg Oral QHS Pucilowska, Jolanta B, MD   15 mg at 05/05/17 2151   PTA Medications: Prescriptions Prior to Admission  Medication Sig Dispense Refill Last Dose  . amantadine (SYMMETREL) 100 MG capsule Take 100 mg by mouth 2 (two) times daily.     . benztropine (COGENTIN) 2 MG tablet Take 1 tablet by mouth daily.     [name] lithium carbonate 300 MG capsule Take 300 mg by mouth 3 (three) times daily.     . propranolol (INDERAL) 20 MG tablet Take 20 mg by mouth 2 (two) times daily.     . [DISCONTINUED] divalproex (DEPAKOTE) 250 MG DR tablet Take 3 tablets (750 mg total) by mouth every 12 (twelve) hours. 180 tablet 0 Taking  . [DISCONTINUED] insulin glargine (LANTUS) 100 UNIT/ML injection Inject 0.1 mLs (10 Units total) into the skin daily. 10 mL 0 Taking  . [DISCONTINUED] lithium carbonate (ESKALITH) 450 MG CR tablet Take 450 mg by mouth 2 (two) times  daily.     . [DISCONTINUED] metFORMIN (GLUCOPHAGE) 1000 MG tablet Take 1 tablet (1,000 mg total) by mouth 2 (two) times daily with a meal. 60 tablet 0 Taking  . [DISCONTINUED] metoprolol succinate (TOPROL-XL) 50 MG 24 hr tablet Take 1 tablet (50 mg total) by mouth daily. Take with or immediately following a meal. 30 tablet 0 Taking   Patient Stressors: Financial difficulties Health problems Medication change or noncompliance  Patient Strengths: Ability for insight Capable of independent living Supportive family/friends  Treatment Modalities: Medication Management, Group therapy, Case management,  1 to 1 session with clinician, Psychoeducation, Recreational therapy.   Physician Treatment Plan for Primary Diagnosis: Schizoaffective disorder, bipolar type  (HCC) Long Term Goal(s): Improvement in symptoms so as ready for discharge Improvement in symptoms so as ready for discharge   Short Term Goals: Ability to identify changes in lifestyle to reduce recurrence of condition will improve Ability to verbalize feelings will improve Ability to disclose and discuss suicidal ideas Ability to demonstrate self-control will improve Ability to identify and develop effective coping behaviors will improve Compliance with prescribed medications will improve Ability to identify triggers associated with substance abuse/mental health issues will improve Compliance with prescribed medications will improve  Medication Management: Evaluate patient's response, side effects, and tolerance of medication regimen.  Therapeutic Interventions: 1 to 1 sessions, Unit Group sessions and Medication administration.  Evaluation of Outcomes: Progressing  Physician Treatment Plan for Secondary Diagnosis: Principal Problem:   Schizoaffective disorder, bipolar type (HCC) Active Problems:   Essential hypertension   Obesity   Diabetes (HCC)   Tobacco use disorder  Long Term Goal(s): Improvement in symptoms so as ready for discharge Improvement in symptoms so as ready for discharge   Short Term Goals: Ability to identify changes in lifestyle to reduce recurrence of condition will improve Ability to verbalize feelings will improve Ability to disclose and discuss suicidal ideas Ability to demonstrate self-control will improve Ability to identify and develop effective coping behaviors will improve Compliance with prescribed medications will improve Ability to identify triggers associated with substance abuse/mental health issues will improve Compliance with prescribed medications will improve     Medication Management: Evaluate patient's response, side effects, and tolerance of medication regimen.  Therapeutic Interventions: 1 to 1 sessions, Unit Group sessions  and Medication administration.  Evaluation of Outcomes: Progressing   RN Treatment Plan for Primary Diagnosis: Schizoaffective disorder, bipolar type (HCC) Long Term Goal(s): Knowledge of disease and therapeutic regimen to maintain health will improve  Short Term Goals: Ability to verbalize frustration and anger appropriately will improve, Ability to participate in decision making will improve and Ability to identify and develop effective coping behaviors will improve  Medication Management: RN will administer medications as ordered by provider, will assess and evaluate patient's response and provide education to patient for prescribed medication. RN will report any adverse and/or side effects to prescribing provider.  Therapeutic Interventions: 1 on 1 counseling sessions, Psychoeducation, Medication administration, Evaluate responses to treatment, Monitor vital signs and CBGs as ordered, Perform/monitor CIWA, COWS, AIMS and Fall Risk screenings as ordered, Perform wound care treatments as ordered.  Evaluation of Outcomes: Progressing   LCSW Treatment Plan for Primary Diagnosis: Schizoaffective disorder, bipolar type (HCC) Long Term Goal(s): Safe transition to appropriate next level of care at discharge, Engage patient in therapeutic group addressing interpersonal concerns.  Short Term Goals: Engage patient in aftercare planning with referrals and resources, Increase ability to appropriately verbalize feelings, Increase emotional regulation and Identify triggers associated with mental  health/substance abuse issues  Therapeutic Interventions: Assess for all discharge needs, 1 to 1 time with Social worker, Explore available resources and support systems, Assess for adequacy in community support network, Educate family and significant other(s) on suicide prevention, Complete Psychosocial Assessment, Interpersonal group therapy.  Evaluation of Outcomes:  Progressing   Progress in  Treatment: Attending groups: No Participating in groups: No Taking medication as prescribed: Yes. Toleration medication: Yes. Family/Significant other contact made: Yes, individual(s) contacted:  mother Patient understands diagnosis: Yes. Discussing patient identified problems/goals with staff: Yes. Medical problems stabilized or resolved: Yes. Denies suicidal/homicidal ideation: Yes. Issues/concerns per patient self-inventory: No. Other:    New problem(s) identified: No, Describe:     New Short Term/Long Term Goal(s):  Discharge Plan or Barriers: Pt will follow up at [facility].  Reason for Continuation of Hospitalization: Mania Medication stabilization  Estimated Length of Stay: 1-2 days  Attendees: Patient: 05/06/2017   Physician: Dr. Jennet Maduro, MD 05/06/2017   Nursing: Marjo Bicker, RN 05/06/2017   RN Care Manager: 05/06/2017   Social Worker: Daleen Squibb and Jake Shark, LCSWs 05/06/2017   Recreational Therapist:  05/06/2017   Other:  05/06/2017   Other:  05/06/2017   Other: 05/06/2017       Scribe for Treatment Team: [name], LCSW 05/06/2017 3:20 PM

## 2017-05-06 NOTE — Plan of Care (Signed)
Problem: Coping: Goal: Ability to verbalize feelings will improve Outcome: Progressing Patient verbalized feelings are improving.

## 2017-05-06 NOTE — Plan of Care (Signed)
Problem: Activity: Goal: Interest or engagement in activities will improve Outcome: Not Progressing Patient is not attending group  Problem: Education: Goal: Emotional status will improve Outcome: Progressing Patient is in a pleasant mood but still appears flat in affect Goal: Mental status will improve Outcome: Progressing Patient mental status is gradually inmproving  Problem: Coping: Goal: Ability to verbalize frustrations and anger appropriately will improve Outcome: Progressing Patient has been calm and cooperative with shift and interacts well with staff  Problem: Safety: Goal: Periods of time without injury will increase Outcome: Progressing Patient denies SI/HI and remains free of injury and safe while on unit

## 2017-05-06 NOTE — Progress Notes (Signed)
CSW spoke with Erie Noe from [facility].  Carney Bern is requesting that pt have [facility]/day program rather than ACT team.  CSW spoke with Jaquita Folds at [facility]-she spoke with her

## 2017-05-07 ENCOUNTER — Inpatient Hospital Stay: Payer: Medicare Other

## 2017-05-07 LAB — CBC WITH DIFFERENTIAL/PLATELET
Basophils Absolute: 0 10*3/uL (ref 0–0.1)
Basophils Relative: 0 %
Eosinophils Absolute: 0.7 10*3/uL (ref 0–0.7)
Eosinophils Relative: 7 %
HCT: 40.2 % (ref 40.0–52.0)
Hemoglobin: 13.6 g/dL (ref 13.0–18.0)
Lymphocytes Relative: 21 %
Lymphs Abs: 2 10*3/uL (ref 1.0–3.6)
MCH: 28.1 pg (ref 26.0–34.0)
MCHC: 33.8 g/dL (ref 32.0–36.0)
MCV: 83.1 fL (ref 80.0–100.0)
Monocytes Absolute: 0.9 10*3/uL (ref 0.2–1.0)
Monocytes Relative: 9 %
Neutro Abs: 6 10*3/uL (ref 1.4–6.5)
Neutrophils Relative %: 63 %
Platelets: 261 10*3/uL (ref 150–440)
RBC: 4.83 MIL/uL (ref 4.40–5.90)
RDW: 13.7 % (ref 11.5–14.5)
WBC: 9.6 10*3/uL (ref 3.8–10.6)

## 2017-05-07 LAB — GLUCOSE, CAPILLARY: Glucose-Capillary: 98 mg/dL (ref 65–99)

## 2017-05-07 NOTE — Progress Notes (Signed)
Spoke with staff member at [facility] group home and was informed that patient not able to come today due to facility unable to get insulin from pharmacy. Unable to talk with Hassell Done) who was working on this she did not answer her phone. Message left on Tammy Sours (Child psychotherapist) Engineer, technical sales.

## 2017-05-07 NOTE — [place] FL2 (Signed)
[place] MEDICAID [place]2 [place] TOOL     IDENTIFICATION  Patient Name: Greg S Matassa Jr. Birthdate: 1966-03-29 Sex: male Admission Date (Current Location): 04/16/2017  [place] and [place] Number:  [address] [id number] T Facility and Address:  [facility], [address], [place], [place] [address]      Provider Number: [id number]  Attending Physician Name and Address:  Shari Prows, MD  Relative Name and Phone Number:  Shey Bartmess, parents, [phone]     Current Level of Care: Hospital Recommended Level of Care: [facility] Prior Approval Number: [id number]  Date Approved/Denied: 05/02/17 PASRR Number:    Discharge Plan: Other (Comment) (Family Care home)    Current Diagnoses: Patient Active Problem List   Diagnosis Date Noted  . Schizoaffective disorder, bipolar type (HCC) 03/28/2016  . Acute cholecystitis with chronic cholecystitis 03/27/2016  . Anxiety   . Benign familial tremor 10/12/2015  . Bipolar 2 disorder (HCC)   . Hyponatremia 10/06/2015  . Essential hypertension 11/28/2014  . Dyslipidemia 11/28/2014  . Obesity 11/28/2014  . Diabetes (HCC) 11/28/2014  . Tobacco use disorder 11/28/2014    Orientation RESPIRATION BLADDER Height & Weight     Self, Time, Situation, Place  Normal Continent Weight: 246 lb (111.6 kg) Height:   (175.3 cm)  BEHAVIORAL SYMPTOMS/MOOD NEUROLOGICAL BOWEL NUTRITION STATUS  Verbally abusive, Dangerous to self, others or property  (na) Continent  (na)  AMBULATORY STATUS COMMUNICATION OF NEEDS Skin   Independent Verbally Normal                       Personal Care Assistance Level of Assistance   (na)           Functional Limitations Info   (na)          SPECIAL CARE FACTORS FREQUENCY   (diabetic)                    Contractures Contractures Info: Not present    Additional Factors Info   (na)               Current  Medications (05/07/2017):  This is the current hospital active medication list Current Facility-Administered Medications  Medication Dose Route Frequency Provider Last Rate Last Dose  . acetaminophen (TYLENOL) tablet 650 mg  650 mg Oral Q6H PRN [name], [name], MD   650 mg at 05/05/17 0919  . alum & mag hydroxide-simeth (MAALOX/MYLANTA) 200-200-20 MG/5ML suspension 30 mL  30 mL Oral Q4H PRN [name], [name], MD      . cloZAPine (CLOZARIL) tablet 200 mg  200 mg Oral QHS Pucilowska, Jolanta B, MD   200 mg at 05/06/17 2153  . divalproex (DEPAKOTE) DR tablet 750 mg  750 mg Oral Q12H [name], [name], MD   750 mg at 05/07/17 0900  . hydrOXYzine (ATARAX/VISTARIL) tablet 50 mg  50 mg Oral TID PRN [name], [name], MD   50 mg at 04/27/17 2250  . insulin glargine (LANTUS) injection 10 Units  10 Units Subcutaneous Daily [name], [name], MD   10 Units at 05/06/17 2150  . ipratropium (ATROVENT) 0.06 % nasal spray 2 spray  2 spray Each Nare QHS Pucilowska, Jolanta B, MD   2 spray at 05/06/17 2154  . lithium carbonate (ESKALITH) CR tablet 450 mg  450 mg Oral Q12H [name], [name], MD   450 mg at 05/07/17 0900  . magnesium hydroxide (MILK OF MAGNESIA)  suspension 30 mL  30 mL Oral Daily PRN [name], [name], MD      . metFORMIN (GLUCOPHAGE) tablet 1,000 mg  1,000 mg Oral BID WC [name], [name], MD   1,000 mg at 05/07/17 1044  . metoprolol succinate (TOPROL-XL) 24 hr tablet 50 mg  50 mg Oral Daily [name], [name], MD   50 mg at 05/07/17 0900  . nicotine (NICODERM CQ - dosed in mg/24 hours) patch 21 mg  21 mg Transdermal Daily Pucilowska, Jolanta B, MD   21 mg at 05/07/17 0910  . paliperidone (INVEGA SUSTENNA) injection 234 mg  234 mg Intramuscular Q28 days Pucilowska, Jolanta B, MD   234 mg at 04/22/17 1701  . pneumococcal 23 valent vaccine (PNU-IMMUNE) injection 0.5 mL  0.5 mL Intramuscular Tomorrow-1000 Pucilowska, Jolanta B, MD      . temazepam (RESTORIL) capsule 15 mg  15 mg Oral QHS Pucilowska, Jolanta B, MD   15 mg  at 05/06/17 2153     Discharge Medications: Please see discharge summary for a list of discharge medications.  Relevant Imaging Results:  Relevant Lab Results:   Additional Information na  [name], LCSW

## 2017-05-07 NOTE — BHH Group Notes (Signed)
LCSW Group Therapy Note  05/07/2017 3pm  Type of Therapy/Topic:  Group Therapy:  Feelings about Diagnosis  Participation Level:  Did Not Attend   Description of Group:   This group will allow patients to explore their thoughts and feelings about diagnoses they have received. Patients will be guided to explore their level of understanding and acceptance of these diagnoses. Facilitator will encourage patients to process their thoughts and feelings about the reactions of others to their diagnosis and will guide patients in identifying ways to discuss their diagnosis with significant others in their lives. This group will be process-oriented, with patients participating in exploration of their own experiences, giving and receiving support, and processing challenge from other group members.   Therapeutic Goals: 1. Patient will demonstrate understanding of diagnosis as evidenced by identifying two or more symptoms of the disorder 2. Patient will be able to express two feelings regarding the diagnosis 3. Patient will demonstrate their ability to communicate their needs through discussion and/or role play  Summary of Patient Progress:       Therapeutic Modalities:   Cognitive Behavioral Therapy Brief Therapy Feelings Identification    Glennon Mac, LCSW 05/07/2017 4:04 PM

## 2017-05-07 NOTE — Progress Notes (Signed)
  [facility] Adult Case Management Discharge Plan :  Will you be returning to the same living situation after discharge:  No. New placement in group home. At discharge, do you have transportation home?: Yes,  group home Do you have the ability to pay for your medications: Yes,  medicaid  Release of information consent forms completed and in the chart;  Patient's signature needed at discharge.  Patient to Follow up at: Follow-up Information    [facility]. Go in 7 day(s).   Why:  Please attend a walk in appointment between 9am-4pm, Monday-Friday, to complete assessment for medication management and the day program.  Please bring a copy of your hospital discharge paperwork. Contact information: [address] [place] [address] [phone]           Next level of care provider has access to [facility] Link:no  Safety Planning and Suicide Prevention discussed: Yes,  with mother  Have you used any form of tobacco in the last 30 days? (Cigarettes, Smokeless Tobacco, Cigars, and/or Pipes): Yes  Has patient been referred to the Quitline?: Patient refused referral  Patient has been referred for addiction treatment: Yes  [name], LCSW 05/07/2017, 3:15 PM

## 2017-05-07 NOTE — BHH Suicide Risk Assessment (Signed)
[facility] Discharge Suicide Risk Assessment   Principal Problem: Schizoaffective disorder, bipolar type [facility]) Discharge Diagnoses:  Patient Active Problem List   Diagnosis Date Noted  . Schizoaffective disorder, bipolar type (HCC) [F25.0] 03/28/2016  . Acute cholecystitis with chronic cholecystitis [K81.2] 03/27/2016  . Anxiety [F41.9]   . 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 Bipolar 2 disorder (HCC) [F31.81]   . Hyponatremia [E87.1] 10/06/2015  . Essential hypertension [I10] 11/28/2014  . Dyslipidemia [E78.5] 11/28/2014  . Obesity [E66.9] 11/28/2014  . Diabetes (HCC) [E11.9] 11/28/2014  . Tobacco use disorder [F17.200] 11/28/2014    Total Time spent with patient: 30 minutes  Musculoskeletal: Strength & Muscle Tone: within normal limits Gait & Station: normal Patient leans: N/A  Psychiatric Specialty Exam: Review of Systems  Neurological: Negative.   Psychiatric/Behavioral: Negative.   All other systems reviewed and are negative.   Blood pressure 112/76, pulse 86, temperature 98.8 F (37.1 C), temperature source Oral, resp. rate 20, height  (1.753 m), weight 111.6 kg (246 lb), SpO2 100 %.Body mass index is 36.33 kg/m.  General Appearance: Casual  Eye Contact::  Good  Speech:  Clear and Coherent409  Volume:  Normal  Mood:  Anxious  Affect:  Blunt  Thought Process:  Goal Directed and Descriptions of Associations: Intact  Orientation:  Full (Time, Place, and Person)  Thought Content:  WDL  Suicidal Thoughts:  No  Homicidal Thoughts:  No  Memory:  Immediate;   Fair Recent;   Fair Remote;   Fair  Judgement:  Impaired  Insight:  Shallow  Psychomotor Activity:  Normal  Concentration:  Fair  Recall:  [employer] of Knowledge:Fair  Language: Fair  Akathisia:  No  Handed:  Right  AIMS (if indicated):     Assets:  Communication Skills Desire for Improvement Financial Resources/Insurance Housing Physical Health Resilience Social Support  Sleep:  Number of  Hours: 6  Cognition: WNL  ADL's:  Intact   Mental Status Per Nursing Assessment::   On Admission:     Demographic Factors:  Male, Divorced or widowed and Caucasian  Loss Factors: NA  Historical Factors: Prior suicide attempts, Family history of mental illness or substance abuse and Impulsivity  Risk Reduction Factors:   Sense of responsibility to family and Positive social support  Continued Clinical Symptoms:  Schizophrenia:   Paranoid or undifferentiated type  Cognitive Features That Contribute To Risk:  None    Suicide Risk:  Minimal: No identifiable suicidal ideation.  Patients presenting with no risk factors but with morbid ruminations; may be classified as minimal risk based on the severity of the depressive symptoms  Follow-up Information    [facility] Follow up.   Contact information: [address] [place] [address] [phone]           Plan Of Care/Follow-up recommendations:  Activity:  as tolerated. Diet:  low sodium heart healthy ADA diet. Other:  keep follow up appointments.  Kristine Linea, MD 05/07/2017, 8:08 AM

## 2017-05-07 NOTE — Discharge Summary (Signed)
Physician Discharge Summary Note  Patient:  Greg [name]. is an 51 y.o., male MRN:  [id number] DOB:  18-Jun-1966 Patient phone:  [phone] (home)  Patient address:   [address] [place] [address],  Total Time spent with patient: 30 minutes  Date of Admission:  04/16/2017 Date of Discharge: 05/06/2017  Reason for Admission:  Psychotic break.  Identifying data. Greg [name] is a 51 year old male with a history of schizoaffective disorder.  Chief complaint. "I have been eating a lot of salt."  History of present illness. Information was obtained from the patient, the chart, and his mother on the phone. The patient was completed by his mother for escalating symptoms of mania with psychosis. The patient became threatening to his elderly parents, refused to take some of his medications, has not been able to sleep, and has been increasingly agitated. He also states started bizarre behavior with consuming large volumes of salt. He has also been neglectful his medical problems. He stopped taking his insulin and blood pressure medication believing that God cured him. The mother reports that the patient has been only partially compliant with medications, picking and choosing what to take. On admission his Lithium level was therapeutic while Depakote level was low. The patient has been taking oral Invega but he is very skilful to cheek his medication. He was on [place] in the past with good results. It is unclear why Invega injections were stopped. According to the mother, all summer long, the patient has been standing in the throat house naked with a necklace made O pieces of concrete, greeting passersby cars, and protesting against abortion. He lately been rather delusional believing that he is God. He grew his hear over the summer but recently buzz cut some of it.   The patient himself admits that he is more manic than usual. He believes that he is taking salt to  prevent tremors. He is very disorganized during treatment team meeting and hard to understand. His speech is pressured and loud. He is giddy and intrusive.  Past psychiatric history. There is a long history of bipolar illness or schizoaffective disorder with multiple psychiatric admissions and multiple treatment trials. He did exceedingly well on lithium initially. He did relatively well on [place] injections. He has never been on Clozaril. He mostly presents with mania but the mother remembers one serious depressive episode.  Family psychiatric history. Grandmother with schizophrenia.  Social history. He dropped out of high school but got his GED and went to college. He is divorced. He is disabled from mental illness. He lives with his elderly parents who no longer believe they can provide care.   Principal Problem: Schizoaffective disorder, bipolar type [facility]) Discharge Diagnoses: Patient Active Problem List   Diagnosis Date Noted  . Schizoaffective disorder, bipolar type (HCC) [F25.0] 03/28/2016  . Acute cholecystitis with chronic cholecystitis [K81.2] 03/27/2016  . Anxiety [F41.9]   . 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 Bipolar 2 disorder (HCC) [F31.81]   . Hyponatremia [E87.1] 10/06/2015  . Essential hypertension [I10] 11/28/2014  . Dyslipidemia [E78.5] 11/28/2014  . Obesity [E66.9] 11/28/2014  . Diabetes (HCC) [E11.9] 11/28/2014  . Tobacco use disorder [F17.200] 11/28/2014     Past Medical History:  Past Medical History:  Diagnosis Date  . Bipolar 1 disorder (HCC)   . Diabetes (HCC)   . Dyslipidemia   . HTN (hypertension)   . Obesity   . Paranoia (HCC)   . Schizo affective schizophrenia (HCC)  Past Surgical History:  Procedure Laterality Date  . APPENDECTOMY  51 years old  . CHOLECYSTECTOMY N/A 03/30/2016   Procedure: LAPAROSCOPIC CHOLECYSTECTOMY;  Surgeon: Gladis Riffle, MD;  Location: [facility] ORS;  Service: General;  Laterality: N/A;  .  TONSILLECTOMY  51 year old   Family History:  Family History  Problem Relation Age of Onset  . Depression Mother   . Mental illness Mother   . Depression Father   . Mental illness Father   . Depression Sister   . Mental illness Sister    Social History:  History  Alcohol Use No     History  Drug Use No    Social History   Social History  . Marital status: Single    Spouse name: N/A  . Number of children: N/A  . Years of education: N/A   Social History Main Topics  . Smoking status: Current Every Day Smoker    Packs/day: 0.50    Years: 8.00    Types: Cigarettes  . Smokeless tobacco: Current User    Types: Snuff  . Alcohol use No  . Drug use: No  . Sexual activity: No   Other Topics Concern  . None   Social History Narrative  . None    Hospital Course:    Greg [name] is a 51 year old male with history of schizoaffective disorder admitted floridly manic in spite of reasonable medication compliance.  1. Mood and psychosis. The patient was maintained on oral Invega in the community. He agreed to [place] injections. Next injection is due on 10/24. We also added clozapine 200 mg for psychosis. We were unable to further increase the dose due to sedation. The patient seems to tolerate the current dose well at the time of discharge. He was continued on lithium 450 mg twice daily and Depakote 750 mg twice daily for mood stabilization. Lithium level I.0, Depakote level 78.    2. Insomnia. Restoril is available.     3. Hypertension. We continued metoprolol.  4. Diabetes. We continued Lantus, metformin, ADA diet. The patient was noncompliant with treatment in the community. Living well with diabetes book was provided.   5. Smoking. Nicotine patch was available.   6. Sleep apnea. The patient continued to refuse to use CPAP.   7. Metabolic syndrome monitoring. Lipid panel and TSH are normal. Hemoglobin A1c 10.9.    8. EKG. normal sinus rhythm, QTC 437.     9. Disposition. At the patient no longer making lives with his elderly parents and was placed in a group home. He will follow up with act team.    Physical Findings: AIMS:  , ,  ,  ,    CIWA:    COWS:     Musculoskeletal: Strength & Muscle Tone: within normal limits Gait & Station: normal Patient leans: N/A  Psychiatric Specialty Exam: Physical Exam  Nursing note and vitals reviewed. Psychiatric: He has a normal mood and affect. His speech is normal and behavior is normal. Cognition and memory are normal. He expresses impulsivity.    Review of Systems  Neurological: Negative.   Psychiatric/Behavioral: Negative.   All other systems reviewed and are negative.   Blood pressure 112/76, pulse 86, temperature 98.8 F (37.1 C), temperature source Oral, resp. rate 20, height  (1.753 m), weight 111.6 kg (246 lb), SpO2 100 %.Body mass index is 36.33 kg/m.  General Appearance: Casual  Eye Contact:  Good  Speech:  Clear and Coherent  Volume:  Normal  Mood:  Euthymic  Affect:  Appropriate  Thought Process:  Goal Directed and Descriptions of Associations: Intact  Orientation:  Full (Time, Place, and Person)  Thought Content:  WDL  Suicidal Thoughts:  No  Homicidal Thoughts:  No  Memory:  Immediate;   Fair Recent;   Fair Remote;   Fair  Judgement:  Poor  Insight:  Shallow  Psychomotor Activity:  Normal  Concentration:  Concentration: Fair and Attention Span: Fair  Recall:  [employer] of Knowledge:  Fair  Language:  Fair  Akathisia:  No  Handed:  Right  AIMS (if indicated):     Assets:  Communication Skills Desire for Improvement Financial Resources/Insurance Housing Physical Health Resilience Social Support  ADL's:  Intact  Cognition:  WNL  Sleep:  Number of Hours: 6     Have you used any form of tobacco in the last 30 days? (Cigarettes, Smokeless Tobacco, Cigars, and/or Pipes): Yes  Has this patient used any form of tobacco in the last 30 days? (Cigarettes,  Smokeless Tobacco, Cigars, and/or Pipes) Yes, Yes, A prescription for an FDA-approved tobacco cessation medication was offered at discharge and the patient refused  Blood Alcohol level:  Lab Results  Component Value Date   [facility] <5 04/14/2017   ETH <5 10/06/2015    Metabolic Disorder Labs:  Lab Results  Component Value Date   HGBA1C 10.9 (H) 04/17/2017   MPG 266.13 04/17/2017   MPG 154 02/09/2015   Lab Results  Component Value Date   PROLACTIN 43.3 (H) 04/17/2017   PROLACTIN 45.2 (H) 10/07/2015   Lab Results  Component Value Date   CHOL 103 04/17/2017   TRIG 110 04/17/2017   HDL 34 (L) 04/17/2017   CHOLHDL 3.0 04/17/2017   VLDL 22 04/17/2017   LDLCALC 47 04/17/2017   LDLCALC 44 10/07/2015    See Psychiatric Specialty Exam and Suicide Risk Assessment completed by Attending Physician prior to discharge.  Discharge destination:  Other:  group home.  Is patient on multiple antipsychotic therapies at discharge:  Yes,   Do you recommend tapering to monotherapy for antipsychotics?  No   Has Patient had three or more failed trials of antipsychotic monotherapy by history:  Yes,   Antipsychotic medications that previously failed include:   1.  Zyprexa., 2.  Risperdal. and 3.  Haldol.  Recommended Plan for Multiple Antipsychotic Therapies: Second antipsychotic is Clozapine.  Reason for adding Clozapine inadequate response to a single agent.  Discharge Instructions    Diet - low sodium heart healthy    Complete by:  As directed    Increase activity slowly    Complete by:  As directed      Allergies as of 05/07/2017      Reactions   Haldol [haloperidol] Anaphylaxis   Seroquel [quetiapine Fumarate] Other (See Comments)   Reaction: morbid, depressed      Medication List    STOP taking these medications   amantadine 100 MG capsule Commonly known as:  SYMMETREL   benztropine 2 MG tablet Commonly known as:  COGENTIN   propranolol 20 MG tablet Commonly known as:   INDERAL     TAKE these medications     Indication  clozapine 200 MG tablet Commonly known as:  CLOZARIL Take 1 tablet (200 mg total) by mouth at bedtime.  Indication:  Schizophrenia that does Not Respond to Usual Drug Therapy   divalproex 250 MG DR tablet Commonly known as:  DEPAKOTE Take 3 tablets (750 mg total) by  mouth every 12 (twelve) hours.  Indication:  Manic Phase of Manic-Depression   insulin glargine 100 UNIT/ML injection Commonly known as:  LANTUS Inject 0.1 mLs (10 Units total) into the skin daily.  Indication:  Type 2 Diabetes   ipratropium 0.06 % nasal spray Commonly known as:  ATROVENT Place 2 sprays into both nostrils at bedtime. Apply under tha tongue for drooling.  Indication:  drooling   lithium carbonate 450 MG CR tablet Commonly known as:  ESKALITH Take 1 tablet (450 mg total) by mouth 2 (two) times daily. What changed:  Another medication with the same name was removed. Continue taking this medication, and follow the directions you see here.  Indication:  Mania   metFORMIN 1000 MG tablet Commonly known as:  GLUCOPHAGE Take 1 tablet (1,000 mg total) by mouth 2 (two) times daily with a meal.  Indication:  Antipsychotic Therapy-Induced Weight Gain, Type 2 Diabetes   metoprolol succinate 50 MG 24 hr tablet Commonly known as:  TOPROL-XL Take 1 tablet (50 mg total) by mouth daily. Take with or immediately following a meal.  Indication:  High Blood Pressure Disorder   paliperidone 234 MG/1.5ML Susp injection Commonly known as:  INVEGA SUSTENNA Inject 234 mg into the muscle every 28 (twenty-eight) days. Next dose on 05/20/2017.  Indication:  Schizophrenia   temazepam 15 MG capsule Commonly known as:  RESTORIL Take 1 capsule (15 mg total) by mouth at bedtime.  Indication:  Trouble Sleeping      Follow-up [facility] Follow up.   Contact information: [address] [place] [address] [phone]            Follow-up recommendations:  Activity:  As tolerated. Diet:  Low sodium heart healthy ADA diet. Other:  Keep follow-up appointments.  Comments:    Signed: Kristine Linea, MD 05/07/2017, 8:08 AM

## 2017-05-07 NOTE — Plan of Care (Signed)
Problem: Activity: Goal: Interest or engagement in activities will improve Outcome: Not Progressing Patient reclusive to room most of day Goal: Sleeping patterns will improve Outcome: Not Progressing Patient takes sleeps during day and at night  Problem: Education: Goal: Emotional status will improve Outcome: Progressing Patient mood is pleasant  Problem: Safety: Goal: Periods of time without injury will increase Outcome: Progressing Patient remains safe on unit and denies SI  Problem: Education: Goal: Will be free of psychotic symptoms Outcome: Progressing Patient does not appear to be responding to internal/ external stimuli and denies A/V/H  Problem: Nutritional: Goal: Ability to achieve adequate nutritional intake will improve Outcome: Progressing Patient appetite is stable

## 2017-05-08 LAB — CLOZAPINE (CLOZARIL)
Clozapine Lvl: 388 ng/mL (ref 350–650)
NorClozapine: 91 ng/mL
Total(Cloz+Norcloz): 479 ng/mL

## 2017-05-08 LAB — GLUCOSE, CAPILLARY: Glucose-Capillary: 89 mg/dL (ref 65–99)

## 2017-05-08 NOTE — [facility] Group Notes (Signed)
  [facility] LCSW Group Therapy Note  Date/Time: 05/08/17, 1300  Type of Therapy/Topic:  Group Therapy:  Emotion Regulation  Participation Level:  Did Not Attend   Mood:  Description of Group:    The purpose of this group is to assist patients in learning to regulate negative emotions and experience positive emotions. Patients will be guided to discuss ways in which they have been vulnerable to their negative emotions. These vulnerabilities will be juxtaposed with experiences of positive emotions or situations, and patients challenged to use positive emotions to combat negative ones. Special emphasis will be placed on coping with negative emotions in conflict situations, and patients will process healthy conflict resolution skills.  Therapeutic Goals: 1. Patient will identify two positive emotions or experiences to reflect on in order to balance out negative emotions:  2. Patient will label two or more emotions that they find the most difficult to experience:  3. Patient will be able to demonstrate positive conflict resolution skills through discussion or role plays:   Summary of Patient Progress:       Therapeutic Modalities:   Cognitive Behavioral Therapy Feelings Identification Dialectical Behavioral Therapy  Greg Alesana Magistro, LCSW 

## 2017-05-08 NOTE — Progress Notes (Signed)
D: Pt A & O X3. Denies SI, HI, AVH and pain. Presents anxious but is pleasant on approach. Minimal but appropriate interactions observed with peers and staff. Pt rated his anxiety 4/10, depression 0/10 and hopelessness 0/10. Per pt "I'm just waiting to go to my group home, maybe I'll leave today".  A: Emotional support and availability provided to pt. Medications administered with verbal education and effects monitored. CSW contacted for information regarding pt's disposition, stated he's working on it, will Archivist of update.Writer informed pt about no current update as to d/c time. Q 15 minutes safety checks continues without self harm gestures thus far. R: Pt receptive to care. Remains medication compliant. Denies adverse drug reactions or concerns at this time. POC continues for safety and mood stability.

## 2017-05-08 NOTE — Progress Notes (Signed)
D:Pt denies SI/HI/AVH, affect is flat but brightens upon approach. Pt is pleasant and cooperative, patient thoughts are organized and logical  no bizarre behavior or confusion  noted. Patient appears less anxious and he is interacting with peers and staff appropriately.   A:Pt was offered support and encouragement,  Pt was given scheduled medications. Pt was encouraged to attend groups. Q 15 minute checks were done for safety.  R:Pt didattend eveninggroup, he was appropriate with peers and staff, and he is compliant withmedication, Pt has no complaints, and he isreceptive to treatment,  15 minutes safety checks maintained on unit, will continue to monitor.

## 2017-05-08 NOTE — Discharge Summary (Signed)
Physician Discharge Summary Note  [name]:  Greg [name]. is an 51 y.o., male MRN:  [id number] DOB:  1965-10-16 [name] phone:  [phone] (home)  [name] address:   [address] [place] [address],  Total Time spent with [name]: 30 minutes  Date of Admission:  04/16/2017 Date of Discharge: 05/08/2017  Reason for Admission:  Psychotic break.  Identifying data. Greg [name] is a 51 year old male with a history of schizoaffective disorder.  Chief complaint. "I have been eating a lot of salt."  History of present illness. Information was obtained from Greg [name], Greg chart, and his mother on Greg phone. Greg [name] was completed by his mother for escalating symptoms of mania with psychosis. Greg [name] became threatening to his elderly parents, refused to take some of his medications, has not been able to sleep, and has been increasingly agitated. He also states started bizarre behavior with consuming large volumes of salt. He has also been neglectful his medical problems. He stopped taking his insulin and blood pressure medication believing that God cured him. Greg mother reports that Greg [name] has been only partially compliant with medications, picking and choosing what to take. On admission his Lithium level was therapeutic while Depakote level was low. Greg [name] has been taking oral Invega but he is very skilful to cheek his medication. He was on [place] in Greg past with good results. It is unclear why Invega injections were stopped. According to Greg mother, all summer long, Greg [name] has been standing in Greg throat house naked with a necklace made O pieces of concrete, greeting passersby cars, and protesting against abortion. He lately been rather delusional believing that he is God. He grew his hear over Greg summer but recently buzz cut some of it.   Greg [name] himself admits that he is more manic than usual. He believes that he is taking salt to  prevent tremors. He is very disorganized during treatment team meeting and hard to understand. His speech is pressured and loud. He is giddy and intrusive.  Past psychiatric history. There is a long history of bipolar illness or schizoaffective disorder with multiple psychiatric admissions and multiple treatment trials. He did exceedingly well on lithium initially. He did relatively well on [place] injections. He has never been on Clozaril. He mostly presents with mania but Greg mother remembers one serious depressive episode.  Family psychiatric history. Grandmother with schizophrenia.  Social history. He dropped out of high school but got his GED and went to college. He is divorced. He is disabled from mental illness. He lives with his elderly parents who no longer believe they can provide care.   Principal Problem: Schizoaffective disorder, bipolar type Greg [facility]) Discharge Diagnoses: [name] Active Problem List   Diagnosis Date Noted  . Schizoaffective disorder, bipolar type (HCC) [F25.0] 03/28/2016  . Acute cholecystitis with chronic cholecystitis [K81.2] 03/27/2016  . Anxiety [F41.9]   . 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 Bipolar 2 disorder (HCC) [F31.81]   . Hyponatremia [E87.1] 10/06/2015  . Essential hypertension [I10] 11/28/2014  . Dyslipidemia [E78.5] 11/28/2014  . Obesity [E66.9] 11/28/2014  . Diabetes (HCC) [E11.9] 11/28/2014  . Tobacco use disorder [F17.200] 11/28/2014     Past Medical History:  Past Medical History:  Diagnosis Date  . Bipolar 1 disorder (HCC)   . Diabetes (HCC)   . Dyslipidemia   . HTN (hypertension)   . Obesity   . Paranoia (HCC)   . Schizo affective schizophrenia (HCC)  Past Surgical History:  Procedure Laterality Date  . APPENDECTOMY  51 years old  . CHOLECYSTECTOMY N/A 03/30/2016   Procedure: LAPAROSCOPIC CHOLECYSTECTOMY;  Surgeon: Gladis Riffle, MD;  Location: [facility] ORS;  Service: General;  Laterality: N/A;  .  TONSILLECTOMY  51 year old   Family History:  Family History  Problem Relation Age of Onset  . Depression Mother   . Mental illness Mother   . Depression Father   . Mental illness Father   . Depression Sister   . Mental illness Sister    Social History:  History  Alcohol Use No     History  Drug Use No    Social History   Social History  . Marital status: Single    Spouse name: N/A  . Number of children: N/A  . Years of education: N/A   Social History Main Topics  . Smoking status: Current Every Day Smoker    Packs/day: 0.50    Years: 8.00    Types: Cigarettes  . Smokeless tobacco: Current User    Types: Snuff  . Alcohol use No  . Drug use: No  . Sexual activity: No   Other Topics Concern  . None   Social History Narrative  . None    Hospital Course:    Greg [name] is a 51 year old male with history of schizoaffective disorder admitted floridly manic in spite of reasonable medication compliance.  1. Mood and psychosis. Greg [name] was maintained on oral Invega in Greg community. He agreed to [place] injections. Next injection is due on 10/24. We also added clozapine 200 mg for psychosis. We were unable to further increase Greg dose due to sedation. Greg [name] seems to tolerate Greg current dose well at Greg time of discharge. He was continued on lithium 450 mg twice daily and Depakote 750 mg twice daily for mood stabilization. Lithium level I.0, Depakote level 78.    2. Insomnia. Restoril is available.     3. Hypertension. We continued metoprolol.  4. Diabetes. We continued Lantus, metformin, ADA diet. Greg [name] was noncompliant with treatment in Greg community. Living well with diabetes book was provided.   5. Smoking. Nicotine patch was available.   6. Sleep apnea. Greg [name] continued to refuse to use CPAP.   7. Metabolic syndrome monitoring. Lipid panel and TSH are normal. Hemoglobin A1c 10.9.    8. EKG. normal sinus rhythm, QTC 437.     9. Disposition. At Greg [name] no longer making lives with his elderly parents and was placed in a group home. He will follow up with act team.   ADDENDUM: Greg [name] was not discharged on 10/9 as planned but went home Greg following day. No change in his information or treatment plan.    Physical Findings: AIMS:  , ,  ,  ,    CIWA:    COWS:     Musculoskeletal: Strength & Muscle Tone: within normal limits Gait & Station: normal [name] leans: N/A  Psychiatric Specialty Exam: Physical Exam  Nursing note and vitals reviewed. Psychiatric: He has a normal mood and affect. His speech is normal and behavior is normal. Cognition and memory are normal. He expresses impulsivity.    Review of Systems  Neurological: Negative.   Psychiatric/Behavioral: Negative.   All other systems reviewed and are negative.   Blood pressure 120/88, pulse 88, temperature 98.8 F (37.1 C), resp. rate 20, height  (1.753 m), weight 111.6 kg (246 lb), SpO2 100 %.Body mass  index is 36.33 kg/m.  General Appearance: Casual  Eye Contact:  Good  Speech:  Clear and Coherent  Volume:  Normal  Mood:  Euthymic  Affect:  Appropriate  Thought Process:  Goal Directed and Descriptions of Associations: Intact  Orientation:  Full (Time, Place, and Person)  Thought Content:  WDL  Suicidal Thoughts:  No  Homicidal Thoughts:  No  Memory:  Immediate;   Fair Recent;   Fair Remote;   Fair  Judgement:  Poor  Insight:  Shallow  Psychomotor Activity:  Normal  Concentration:  Concentration: Fair and Attention Span: Fair  Recall:  [employer] of Knowledge:  Fair  Language:  Fair  Akathisia:  No  Handed:  Right  AIMS (if indicated):     Assets:  Communication Skills Desire for Improvement Financial Resources/Insurance Housing Physical Health Resilience Social Support  ADL's:  Intact  Cognition:  WNL  Sleep:  Number of Hours: 6.15     Have you used any form of tobacco in Greg last 30 days? (Cigarettes,  Smokeless Tobacco, Cigars, and/or Pipes): Yes  Has this [name] used any form of tobacco in Greg last 30 days? (Cigarettes, Smokeless Tobacco, Cigars, and/or Pipes) Yes, Yes, A prescription for an FDA-approved tobacco cessation medication was offered at discharge and Greg [name] refused  Blood Alcohol level:  Lab Results  Component Value Date   [facility] <5 04/14/2017   ETH <5 10/06/2015    Metabolic Disorder Labs:  Lab Results  Component Value Date   HGBA1C 10.9 (H) 04/17/2017   MPG 266.13 04/17/2017   MPG 154 02/09/2015   Lab Results  Component Value Date   PROLACTIN 43.3 (H) 04/17/2017   PROLACTIN 45.2 (H) 10/07/2015   Lab Results  Component Value Date   CHOL 103 04/17/2017   TRIG 110 04/17/2017   HDL 34 (L) 04/17/2017   CHOLHDL 3.0 04/17/2017   VLDL 22 04/17/2017   LDLCALC 47 04/17/2017   LDLCALC 44 10/07/2015    See Psychiatric Specialty Exam and Suicide Risk Assessment completed by Attending Physician prior to discharge.  Discharge destination:  Other:  group home.  Is [name] on multiple antipsychotic therapies at discharge:  Yes,   Do you recommend tapering to monotherapy for antipsychotics?  No   Has [name] had three or more failed trials of antipsychotic monotherapy by history:  Yes,   Antipsychotic medications that previously failed include:   1.  Zyprexa., 2.  Risperdal. and 3.  Haldol.  Recommended Plan for Multiple Antipsychotic Therapies: Second antipsychotic is Clozapine.  Reason for adding Clozapine inadequate response to a single agent.  Discharge Instructions    Diet - low sodium heart healthy    Complete by:  As directed    Increase activity slowly    Complete by:  As directed      Allergies as of 05/08/2017      Reactions   Haldol [haloperidol] Anaphylaxis   Seroquel [quetiapine Fumarate] Other (See Comments)   Reaction: morbid, depressed      Medication List    STOP taking these medications   amantadine 100 MG capsule Commonly known as:   SYMMETREL   benztropine 2 MG tablet Commonly known as:  COGENTIN   propranolol 20 MG tablet Commonly known as:  INDERAL     TAKE these medications     Indication  clozapine 200 MG tablet Commonly known as:  CLOZARIL Take 1 tablet (200 mg total) by mouth at bedtime.  Indication:  Schizophrenia that does Not  Respond to Usual Drug Therapy   divalproex 250 MG DR tablet Commonly known as:  DEPAKOTE Take 3 tablets (750 mg total) by mouth every 12 (twelve) hours.  Indication:  Manic Phase of Manic-Depression   insulin glargine 100 UNIT/ML injection Commonly known as:  LANTUS Inject 0.1 mLs (10 Units total) into Greg skin daily.  Indication:  Type 2 Diabetes   ipratropium 0.06 % nasal spray Commonly known as:  ATROVENT Place 2 sprays into both nostrils at bedtime. Apply under tha tongue for drooling.  Indication:  drooling   lithium carbonate 450 MG CR tablet Commonly known as:  ESKALITH Take 1 tablet (450 mg total) by mouth 2 (two) times daily. What changed:  Another medication with Greg same name was removed. Continue taking this medication, and follow Greg directions you see here.  Indication:  Mania   metFORMIN 1000 MG tablet Commonly known as:  GLUCOPHAGE Take 1 tablet (1,000 mg total) by mouth 2 (two) times daily with a meal.  Indication:  Antipsychotic Therapy-Induced Weight Gain, Type 2 Diabetes   metoprolol succinate 50 MG 24 hr tablet Commonly known as:  TOPROL-XL Take 1 tablet (50 mg total) by mouth daily. Take with or immediately following a meal.  Indication:  High Blood Pressure Disorder   paliperidone 234 MG/1.5ML Susp injection Commonly known as:  INVEGA SUSTENNA Inject 234 mg into Greg muscle every 28 (twenty-eight) days. Next dose on 05/20/2017.  Indication:  Schizophrenia   temazepam 15 MG capsule Commonly known as:  RESTORIL Take 1 capsule (15 mg total) by mouth at bedtime.  Indication:  Trouble Sleeping      Follow-up Information    Pc, [employer]. Go in 7 day(s).   Why:  Please attend a walk in appointment between 9am-4pm, Monday-Friday, to complete assessment for medication management and Greg day program.  Please bring a copy of your hospital discharge paperwork. Contact information: [address] [place] [address] [phone]           Follow-up recommendations:  Activity:  As tolerated. Diet:  Low sodium heart healthy ADA diet. Other:  Keep follow-up appointments.  Comments:    Signed: Kristine Linea, MD 05/08/2017, 9:52 AM

## 2017-05-08 NOTE — BHH Suicide Risk Assessment (Signed)
[facility] Discharge Suicide Risk Assessment   Principal Problem: Schizoaffective disorder, bipolar type [facility]) Discharge Diagnoses:  Patient Active Problem List   Diagnosis Date Noted  . Schizoaffective disorder, bipolar type (HCC) [F25.0] 03/28/2016  . Acute cholecystitis with chronic cholecystitis [K81.2] 03/27/2016  . Anxiety [F41.9]   . 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 Bipolar 2 disorder (HCC) [F31.81]   . Hyponatremia [E87.1] 10/06/2015  . Essential hypertension [I10] 11/28/2014  . Dyslipidemia [E78.5] 11/28/2014  . Obesity [E66.9] 11/28/2014  . Diabetes (HCC) [E11.9] 11/28/2014  . Tobacco use disorder [F17.200] 11/28/2014    Total Time spent with patient: 30 minutes  Musculoskeletal: Strength & Muscle Tone: within normal limits Gait & Station: normal Patient leans: N/A  Psychiatric Specialty Exam: Review of Systems  Neurological: Negative.   Psychiatric/Behavioral: Negative.   All other systems reviewed and are negative.   Blood pressure 120/88, pulse 88, temperature 98.8 F (37.1 C), resp. rate 20, height  (1.753 m), weight 111.6 kg (246 lb), SpO2 100 %.Body mass index is 36.33 kg/m.  General Appearance: Casual  Eye Contact::  Good  Speech:  Clear and Coherent409  Volume:  Normal  Mood:  Anxious  Affect:  Blunt  Thought Process:  Goal Directed and Descriptions of Associations: Intact  Orientation:  Full (Time, Place, and Person)  Thought Content:  WDL  Suicidal Thoughts:  No  Homicidal Thoughts:  No  Memory:  Immediate;   Fair Recent;   Fair Remote;   Fair  Judgement:  Impaired  Insight:  Shallow  Psychomotor Activity:  Normal  Concentration:  Fair  Recall:  [employer] of Knowledge:Fair  Language: Fair  Akathisia:  No  Handed:  Right  AIMS (if indicated):     Assets:  Communication Skills Desire for Improvement Financial Resources/Insurance Housing Physical Health Resilience Social Support  Sleep:  Number of Hours: 6.15  Cognition:  WNL  ADL's:  Intact   Mental Status Per Nursing Assessment::   On Admission:     Demographic Factors:  Male, Divorced or widowed and Caucasian  Loss Factors: NA  Historical Factors: Prior suicide attempts, Family history of mental illness or substance abuse and Impulsivity  Risk Reduction Factors:   Sense of responsibility to family and Positive social support  Continued Clinical Symptoms:  Schizophrenia:   Paranoid or undifferentiated type  Cognitive Features That Contribute To Risk:  None    Suicide Risk:  Minimal: No identifiable suicidal ideation.  Patients presenting with no risk factors but with morbid ruminations; may be classified as minimal risk based on the severity of the depressive symptoms  Follow-up Information    Pc, [employer]. Go in 7 day(s).   Why:  Please attend a walk in appointment between 9am-4pm, Monday-Friday, to complete assessment for medication management and the day program.  Please bring a copy of your hospital discharge paperwork. Contact information: [address] [place] [address] [phone]           Plan Of Care/Follow-up recommendations:  Activity:  as tolerated. Diet:  low sodium heart healthy ADA diet. Other:  keep follow up appointments.  Kristine Linea, MD 05/08/2017, 9:52 AM

## 2017-05-08 NOTE — BHH Group Notes (Signed)
[facility] Group Notes:  (Nursing/MHT/Case Management/Adjunct)  Date:  05/08/2017  Time:  10:16 AM  Type of Therapy:  Psychoeducational Skills  Participation Level:  Did Not Attend  Lynelle Smoke [facility] 05/08/2017, 10:16 AM

## 2017-05-08 NOTE — Progress Notes (Signed)
CSW received several calls at the end of the day on Tuesday, 05/07/17, from [employer], stating she was almost to [facility] to pick up the pt.   During the last phone call, Erie Noe reported problems at the pharmacy.  CSW called [facility] in [place] and provided the correct medicaid number and was told the medication was all set, however, CSW was also told by the pharmacist that [facility] had told her a few minutes ago that she was not going to take the pt due to his diabetes.  CSW attempted to call both Erie Noe and [facility] back and neither one picked.  Pt was still on the unit when CSW left for the day.  On Wednesday, 10/10, CSW arrived to a text from [facility] giving several reasons why they would be unable to take the pt and complaining that they had not received the FL2 prior to discharge.  CSW again attempted to call them back but received no response.  FL2 had not been requested by the facility prior to discharge.  CSW sent text message to both Carney Bern and Erie Noe regarding the fact that we had held this pt until they requested and made numerous changes to his discharge plan only to have them change their mind at the last minute.  CSW spoke with [facility] and Dr Demetrius Charity about options.  CSW called pt's mother and informed her of the situation.  She is willing to have pt return to her home with ACT team in place to assist them with finding placement.  She also said she is moving forward with the legal guardianship paperwork.  CSW called [facility] and re-referred pt for ACT team services. Garner Nash, MSW, LCSW Clinical Social Worker 05/08/2017 1:12 PM

## 2017-05-08 NOTE — Progress Notes (Signed)
[facility] MD Progress Note  05/08/2017 4:32 PM Greg [name].  MRN:  [id number]  Subjective:  Greg [name] is a 51 year old male with history of schizoaffective disorder bipolar type admitted in a severe manic episode in spite of some medication compliance.  Today the patient is disappointed again. For the past week with Me trying to discharge to discover today that the group home that he agrees to take the patient is now refusing. Apparently they have a problem with managing his diabetes. This is likely that she will reason as the patient is well controlled on a combination of low-dose Lantus and metformin. The patient denies any symptoms of depression, is, or psychosis. He is not suicidal or homicidal. He tolerates medications well. Sleep and appetite are good. There are no behavioral problems. We contacted his family. His mother agreed for the patient will return home at least temporarily. He will follow up with ACT team.  Treatment plan. We will continue all medications without changes.  Social, disposition. The patient will be discharged with his parents tomorrow. He will follow up with our team.  Past psychiatric history. There is no history of severe bipolar disorder with multiple medication trials and hospitalizations.  Family psychiatric history. Grandmother with schizophrenia.  Principal Problem: Schizoaffective disorder, bipolar type (HCC) Diagnosis:   Patient Active Problem List   Diagnosis Date Noted  . Schizoaffective disorder, bipolar type (HCC) [F25.0] 03/28/2016  . Acute cholecystitis with chronic cholecystitis [K81.2] 03/27/2016  . Anxiety [F41.9]   . 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 Bipolar 2 disorder (HCC) [F31.81]   . Hyponatremia [E87.1] 10/06/2015  . Essential hypertension [I10] 11/28/2014  . Dyslipidemia [E78.5] 11/28/2014  . Obesity [E66.9] 11/28/2014  . Diabetes (HCC) [E11.9] 11/28/2014  . Tobacco use disorder [F17.200] 11/28/2014   Total Time  spent with patient: 30 minutes  Past Medical History:  Past Medical History:  Diagnosis Date  . Bipolar 1 disorder (HCC)   . Diabetes (HCC)   . Dyslipidemia   . HTN (hypertension)   . Obesity   . Paranoia (HCC)   . Schizo affective schizophrenia [facility])     Past Surgical History:  Procedure Laterality Date  . APPENDECTOMY  51 years old  . CHOLECYSTECTOMY N/A 03/30/2016   Procedure: LAPAROSCOPIC CHOLECYSTECTOMY;  Surgeon: Gladis Riffle, MD;  Location: [facility] ORS;  Service: General;  Laterality: N/A;  . TONSILLECTOMY  51 year old   Family History:  Family History  Problem Relation Age of Onset  . Depression Mother   . Mental illness Mother   . Depression Father   . Mental illness Father   . Depression Sister   . Mental illness Sister     Social History:  History  Alcohol Use No     History  Drug Use No    Social History   Social History  . Marital status: Single    Spouse name: N/A  . Number of children: N/A  . Years of education: N/A   Social History Main Topics  . Smoking status: Current Every Day Smoker    Packs/day: 0.50    Years: 8.00    Types: Cigarettes  . Smokeless tobacco: Current User    Types: Snuff  . Alcohol use No  . Drug use: No  . Sexual activity: No   Other Topics Concern  . None   Social History Narrative  . None   Additional Social History:  Sleep: Fair  Appetite:  Fair  Current Medications: Current Facility-Administered Medications  Medication Dose Route Frequency Provider Last Rate Last Dose  . acetaminophen (TYLENOL) tablet 650 mg  650 mg Oral Q6H PRN [name], [name], MD   650 mg at 05/05/17 0919  . alum & mag hydroxide-simeth (MAALOX/MYLANTA) 200-200-20 MG/5ML suspension 30 mL  30 mL Oral Q4H PRN [name], [name], MD      . cloZAPine (CLOZARIL) tablet 200 mg  200 mg Oral QHS Traci Gafford B, MD   200 mg at 05/07/17 2200  . divalproex (DEPAKOTE) DR tablet 750 mg  750 mg Oral Q12H  [name], [name], MD   750 mg at 05/08/17 1610  . hydrOXYzine (ATARAX/VISTARIL) tablet 50 mg  50 mg Oral TID PRN [name], [name], MD   50 mg at 04/27/17 2250  . insulin glargine (LANTUS) injection 10 Units  10 Units Subcutaneous Daily [name], [name], MD   10 Units at 05/07/17 2200  . ipratropium (ATROVENT) 0.06 % nasal spray 2 spray  2 spray Each Nare QHS Karyl Sharrar B, MD   2 spray at 05/07/17 2159  . lithium carbonate (ESKALITH) CR tablet 450 mg  450 mg Oral Q12H [name], [name], MD   450 mg at 05/08/17 0925  . magnesium hydroxide (MILK OF MAGNESIA) suspension 30 mL  30 mL Oral Daily PRN [name], [name], MD      . metFORMIN (GLUCOPHAGE) tablet 1,000 mg  1,000 mg Oral BID WC [name], [name], MD   1,000 mg at 05/08/17 0924  . metoprolol succinate (TOPROL-XL) 24 hr tablet 50 mg  50 mg Oral Daily [name], [name], MD   50 mg at 05/08/17 0925  . nicotine (NICODERM CQ - dosed in mg/24 hours) patch 21 mg  21 mg Transdermal Daily Izabella Marcantel B, MD   21 mg at 05/08/17 0930  . paliperidone (INVEGA SUSTENNA) injection 234 mg  234 mg Intramuscular Q28 days Shenequa Howse B, MD   234 mg at 04/22/17 1701  . pneumococcal 23 valent vaccine (PNU-IMMUNE) injection 0.5 mL  0.5 mL Intramuscular Tomorrow-1000 Alta Shober B, MD      . temazepam (RESTORIL) capsule 15 mg  15 mg Oral QHS Ayah Cozzolino B, MD   15 mg at 05/07/17 2200    Lab Results:  Results for orders placed or performed during the hospital encounter of 04/16/17 (from the past 48 hour(s))  Glucose, capillary     Status: Abnormal   Collection Time: 05/06/17  8:19 PM  Result Value Ref Range   Glucose-Capillary 125 (H) 65 - 99 mg/dL   Comment 1 Notify RN   CBC with Differential/Platelet     Status: None   Collection Time: 05/07/17  7:14 AM  Result Value Ref Range   WBC 9.6 3.8 - 10.6 K/uL   RBC 4.83 4.40 - 5.90 MIL/uL   Hemoglobin 13.6 13.0 - 18.0 g/dL   HCT 96.0 45.4 - 09.8 %   MCV 83.1 80.0 - 100.0 fL   MCH 28.1  26.0 - 34.0 pg   MCHC 33.8 32.0 - 36.0 g/dL   RDW 11.9 14.7 - 82.9 %   Platelets 261 150 - 440 K/uL   Neutrophils Relative % 63 %   Lymphocytes Relative 21 %   Monocytes Relative 9 %   Eosinophils Relative 7 %   Basophils Relative 0 %   Neutro Abs 6.0 1.4 - 6.5 K/uL   Lymphs Abs 2.0 1.0 - 3.6 K/uL  Monocytes Absolute 0.9 0.2 - 1.0 K/uL   Eosinophils Absolute 0.7 0 - 0.7 K/uL   Basophils Absolute 0.0 0 - 0.1 K/uL   WBC Morphology MILD LEFT SHIFT (1-5% METAS, OCC MYELO, OCC BANDS)    Smear Review MORPHOLOGY UNREMARKABLE   Glucose, capillary     Status: None   Collection Time: 05/07/17  9:55 PM  Result Value Ref Range   Glucose-Capillary 98 65 - 99 mg/dL    Blood Alcohol level:  Lab Results  Component Value Date   ETH <5 04/14/2017   ETH <5 10/06/2015    Metabolic Disorder Labs: Lab Results  Component Value Date   HGBA1C 10.9 (H) 04/17/2017   MPG 266.13 04/17/2017   MPG 154 02/09/2015   Lab Results  Component Value Date   PROLACTIN 43.3 (H) 04/17/2017   PROLACTIN 45.2 (H) 10/07/2015   Lab Results  Component Value Date   CHOL 103 04/17/2017   TRIG 110 04/17/2017   HDL 34 (L) 04/17/2017   CHOLHDL 3.0 04/17/2017   VLDL 22 04/17/2017   LDLCALC 47 04/17/2017   LDLCALC 44 10/07/2015    Physical Findings: AIMS:  , ,  ,  ,    CIWA:    COWS:     Musculoskeletal: Strength & Muscle Tone: within normal limits Gait & Station: normal Patient leans: N/A  Psychiatric Specialty Exam: Physical Exam  Nursing note and vitals reviewed. Psychiatric: He has a normal mood and affect. His speech is normal and behavior is normal. Thought content normal. Cognition and memory are normal. He expresses impulsivity.    Review of Systems  Neurological: Negative.   Psychiatric/Behavioral: Negative.   All other systems reviewed and are negative.   Blood pressure 120/88, pulse 88, temperature 98.8 F (37.1 C), resp. rate 20, height  (1.753 m), weight 111.6 kg (246 lb), SpO2  100 %.Body mass index is 36.33 kg/m.  General Appearance: Disheveled  Eye Contact:  Good  Speech:  Clear and Coherent  Volume:  Normal  Mood:  Euthymic  Affect:  Blunt  Thought Process:  Goal Directed and Descriptions of Associations: Intact  Orientation:  Full (Time, Place, and Person)  Thought Content:  WDL  Suicidal Thoughts:  No  Homicidal Thoughts:  No  Memory:  Immediate;   Fair Recent;   Fair Remote;   Fair  Judgement:  Impaired  Insight:  Shallow  Psychomotor Activity:  Decreased  Concentration:  Concentration: Fair and Attention Span: Fair  Recall:  [employer] of Knowledge:  Fair  Language:  Fair  Akathisia:  No  Handed:  Right  AIMS (if indicated):     Assets:  Communication Skills Desire for Improvement Financial Resources/Insurance Housing Physical Health Resilience Social Support  ADL's:  Intact  Cognition:  WNL  Sleep:  Number of Hours: 6.15     Treatment Plan Summary: Daily contact with patient to assess and evaluate symptoms and progress in treatment and Medication management   Mr. Barkan is a 51 year old male with history of schizoaffective disorder admitted floridly manic in spite of reasonable medication compliance.  1. Mood and psychosis. The patient was maintained on oral Invega in the community. He agreed to [place] injections. Next injection is due on 10/24. We also added clozapine 200 mg for psychosis. We were unable to further increase the dose due to sedation. The patient seems to tolerate the current dose well at the time of discharge. He was continued on lithium 450 mg twice daily and Depakote  750 mg twice daily for mood stabilization. Lithium level I.0, Depakote level 78.    2. Insomnia. Restoril is available.    3. Hypertension. We continued metoprolol.  4. Diabetes. We continued Lantus, metformin, ADA diet. The patient was noncompliant with treatment in the community. Living well with diabetes book was provided.   5.  Smoking. Nicotine patch was available.   6. Sleep apnea. The patient continued to refuse to use CPAP.   7. Metabolic syndrome monitoring. Lipid panel and TSH are normal. Hemoglobin A1c 10.9.    8. EKG. normal sinus rhythm, QTC 437.    9. Disposition. The patient will be discharged with his family. No group home placement is possible at present. He will follow up with act team.  No other information changes of medication adjustments were made today.      Kristine Linea, MD 05/08/2017, 4:32 PM

## 2017-05-09 LAB — CBC WITH DIFFERENTIAL/PLATELET
Basophils Absolute: 0 10*3/uL (ref 0–0.1)
Basophils Relative: 1 %
Eosinophils Absolute: 0.7 10*3/uL (ref 0–0.7)
Eosinophils Relative: 8 %
HCT: 40.7 % (ref 40.0–52.0)
Hemoglobin: 13.9 g/dL (ref 13.0–18.0)
Lymphocytes Relative: 25 %
Lymphs Abs: 2.4 10*3/uL (ref 1.0–3.6)
MCH: 28.5 pg (ref 26.0–34.0)
MCHC: 34.2 g/dL (ref 32.0–36.0)
MCV: 83.3 fL (ref 80.0–100.0)
Monocytes Absolute: 0.7 10*3/uL (ref 0.2–1.0)
Monocytes Relative: 8 %
Neutro Abs: 5.6 10*3/uL (ref 1.4–6.5)
Neutrophils Relative %: 58 %
Platelets: 290 10*3/uL (ref 150–440)
RBC: 4.88 MIL/uL (ref 4.40–5.90)
RDW: 13.8 % (ref 11.5–14.5)
WBC: 9.5 10*3/uL (ref 3.8–10.6)

## 2017-05-09 MED ORDER — TEMAZEPAM 30 MG PO CAPS: 30.0000 mg | ORAL_CAPSULE | Freq: Every day | ORAL | 0 refills | Status: DC

## 2017-05-09 MED ORDER — TEMAZEPAM 15 MG PO CAPS: 30.0000 mg | ORAL_CAPSULE | Freq: Every day | ORAL | Status: DC

## 2017-05-09 NOTE — Progress Notes (Signed)
.  D:Pt denies SI/HI/AVH, affect is flat but brightens upon approach. Pt is pleasant and cooperative, his thoughts are organized and logical, no bizarre behavior noted. Patient appears less anxious and he is interacting with peers and staff appropriately.  A:Pt was offered support and encouragement, Pt was given scheduled medications. Pt was encouraged to attend groups. Q 15 minute checks were done for safety.  R:Pt didattend eveninggroup, he was appropriate with peers and staff, and he is compliant withmedication, Pt has no complaints, and he isreceptive to treatment, 15 minutes safety checks maintained on unit, will continue to monitor.

## 2017-05-09 NOTE — BHH Suicide Risk Assessment (Signed)
[facility] Discharge Suicide Risk Assessment   Principal Problem: Schizoaffective disorder, bipolar type [facility]) Discharge Diagnoses:  Patient Active Problem List   Diagnosis Date Noted  . Schizoaffective disorder, bipolar type (HCC) [F25.0] 03/28/2016  . Acute cholecystitis with chronic cholecystitis [K81.2] 03/27/2016  . Anxiety [F41.9]   . 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 Bipolar 2 disorder (HCC) [F31.81]   . Hyponatremia [E87.1] 10/06/2015  . Essential hypertension [I10] 11/28/2014  . Dyslipidemia [E78.5] 11/28/2014  . Obesity [E66.9] 11/28/2014  . Diabetes (HCC) [E11.9] 11/28/2014  . Tobacco use disorder [F17.200] 11/28/2014    Total Time spent with patient: 30 minutes  Musculoskeletal: Strength & Muscle Tone: within normal limits Gait & Station: normal Patient leans: N/A  Psychiatric Specialty Exam: Review of Systems  Neurological: Negative.   Psychiatric/Behavioral: Negative.   All other systems reviewed and are negative.   Blood pressure 118/63, pulse 94, temperature 97.7 F (36.5 C), temperature source Oral, resp. rate 18, height  (1.753 m), weight 111.6 kg (246 lb), SpO2 98 %.Body mass index is 36.33 kg/m.  General Appearance: Fairly Groomed  Patent attorney::  Good  Speech:  Clear and Coherent409  Volume:  Normal  Mood:  Euthymic  Affect:  Blunt  Thought Process:  Goal Directed and Descriptions of Associations: Intact  Orientation:  Full (Time, Place, and Person)  Thought Content:  WDL  Suicidal Thoughts:  No  Homicidal Thoughts:  No  Memory:  Immediate;   Fair Recent;   Fair Remote;   Fair  Judgement:  Poor  Insight:  Shallow  Psychomotor Activity:  Decreased  Concentration:  Fair  Recall:  [employer] of Knowledge:Fair  Language: Fair  Akathisia:  No  Handed:  Right  AIMS (if indicated):     Assets:  Communication Skills Desire for Improvement Financial Resources/Insurance Housing Physical Health Resilience Social Support  Sleep:   Number of Hours: 3.15  Cognition: WNL  ADL's:  Intact   Mental Status Per Nursing Assessment::   On Admission:     Demographic Factors:  Male and Caucasian  Loss Factors: NA  Historical Factors: Prior suicide attempts, Family history of mental illness or substance abuse and Impulsivity  Risk Reduction Factors:   Sense of responsibility to family, Living with another person, especially a relative and Positive social support  Continued Clinical Symptoms:  Bipolar Disorder:   Mixed State  Cognitive Features That Contribute To Risk:  None    Suicide Risk:  Minimal: No identifiable suicidal ideation.  Patients presenting with no risk factors but with morbid ruminations; may be classified as minimal risk based on the severity of the depressive symptoms    Plan Of CarAs tolerated.ollow-up recommendations:  Activity:  As tolerated. Diet:  Low sodium heart healthy ADA diet. Other:  Keep follow-up appointments.  Kristine Linea, MD 05/09/2017, 8:04 AM

## 2017-05-09 NOTE — Progress Notes (Signed)
  [facility] Adult Case Management Discharge Plan :  Will you be returning to the same living situation after discharge:  Yes,  with parents At discharge, do you have transportation home?: Yes,  parents Do you have the ability to pay for your medications: Yes,  medicare/medicaid  Release of information consent forms completed and in the chart;  Patient's signature needed at discharge.  Patient to Follow up at: Follow-up Information    [facility] Follow up on 05/13/2017.   Why:  The ACT team will meet you at your home between 12noon and 3:00pm on Monday, 05/13/17, for your intake appointment. Contact information: [address] [place] [address] [phone]           Next level of care provider has access to [facility] Link:no  Safety Planning and Suicide Prevention discussed: Yes,  with parents  Have you used any form of tobacco in the last 30 days? (Cigarettes, Smokeless Tobacco, Cigars, and/or Pipes): Yes  Has patient been referred to the Quitline?: Patient refused referral  Patient has been referred for addiction treatment: Yes  [name], LCSW 05/09/2017, 11:33 AM

## 2017-05-09 NOTE — Discharge Summary (Signed)
Physician Discharge Summary Note  Patient:  Greg [name]. is an 51 y.o., male MRN:  [id number] DOB:  [birth year] Patient phone:  [phone] (home)  Patient address:   [address] [place] [address],  Total Time spent with patient: 30 minutes  Date of Admission:  04/16/2017 Date of Discharge: 05/09/2017  Reason for Admission:  Psychotic break.  Identifying data. [name] is a 51 year old male with a history of schizoaffective disorder.  Chief complaint. "I have been eating a lot of salt."  History of present illness. Information was obtained from the patient, the chart, and his mother on the phone. The patient was completed by his mother for escalating symptoms of mania with psychosis. The patient became threatening to his elderly parents, refused to take some of his medications, has not been able to sleep, and has been increasingly agitated. He also states started bizarre behavior with consuming large volumes of salt. He has also been neglectful his medical problems. He stopped taking his insulin and blood pressure medication believing that God cured him. The mother reports that the patient has been only partially compliant with medications, picking and choosing what to take. On admission his Lithium level was therapeutic while Depakote level was low. The patient has been taking oral Invega but he is very skilful to cheek his medication. He was on [place] in the past with good results. It is unclear why Invega injections were stopped. According to the mother, all summer long, the patient has been standing in the throat house naked with a necklace made O pieces of concrete, greeting passersby cars, and protesting against abortion. He lately been rather delusional believing that he is God. He grew his hear over the summer but recently buzz cut some of it.   The patient himself admits that he is more manic than usual. He believes that he is taking salt to  prevent tremors. He is very disorganized during treatment team meeting and hard to understand. His speech is pressured and loud. He is giddy and intrusive.  Past psychiatric history. There is a long history of bipolar illness or schizoaffective disorder with multiple psychiatric admissions and multiple treatment trials. He did exceedingly well on lithium initially. He did relatively well on [place] injections. He has never been on Clozaril. He mostly presents with mania but the mother remembers one serious depressive episode.  Family psychiatric history. Grandmother with schizophrenia.  Social history. He dropped out of high school but got his GED and went to college. He is divorced. He is disabled from mental illness. He lives with his elderly parents who no longer believe they can provide care.   No new information was available at the time of discharge.  Principal Problem: Schizoaffective disorder, bipolar type Greg [name]) Discharge Diagnoses: Patient Active Problem List   Diagnosis Date Noted  . Schizoaffective disorder, bipolar type (HCC) [F25.0] 03/28/2016  . Acute cholecystitis with chronic cholecystitis [K81.2] 03/27/2016  . Anxiety [F41.9]   . 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 Bipolar 2 disorder (HCC) [F31.81]   . Hyponatremia [E87.1] 10/06/2015  . Essential hypertension [I10] 11/28/2014  . Dyslipidemia [E78.5] 11/28/2014  . Obesity [E66.9] 11/28/2014  . Diabetes (HCC) [E11.9] 11/28/2014  . Tobacco use disorder [F17.200] 11/28/2014     Past Medical History:  Past Medical History:  Diagnosis Date  . Bipolar 1 disorder (HCC)   . Diabetes (HCC)   . Dyslipidemia   . HTN (hypertension)   . Obesity   .  Paranoia (HCC)   . Schizo affective schizophrenia [facility])     Past Surgical History:  Procedure Laterality Date  . APPENDECTOMY  51 years old  . CHOLECYSTECTOMY N/A 03/30/2016   Procedure: LAPAROSCOPIC CHOLECYSTECTOMY;  Surgeon: Gladis Riffle, MD;   Location: [facility] ORS;  Service: General;  Laterality: N/A;  . TONSILLECTOMY  51 year old   Family History:  Family History  Problem Relation Age of Onset  . Depression Mother   . Mental illness Mother   . Depression Father   . Mental illness Father   . Depression Sister   . Mental illness Sister    Social History:  History  Alcohol Use No     History  Drug Use No    Social History   Social History  . Marital status: Single    Spouse name: N/A  . Number of children: N/A  . Years of education: N/A   Social History Main Topics  . Smoking status: Current Every Day Smoker    Packs/day: 0.50    Years: 8.00    Types: Cigarettes  . Smokeless tobacco: Current User    Types: Snuff  . Alcohol use No  . Drug use: No  . Sexual activity: No   Other Topics Concern  . None   Social History Narrative  . None    Hospital Course:    [name] is a 51 year old male with a history of schizoaffective disorder admitted floridly manic and threatening to his family.   1. Mood and psychosis.  We continued lithium 450 mg twice daily and Depakote 750 mg twice daily for mood stabilization. Lithium level was 1.0, VPA level 78. The patient agreed to take [place] injection. Next dose of 234 mg is due on 10/24. With also started clozapine for psychosis. Clozapine level on 200 mg nightly was 479. We do not recommend further dose increase as the patient gets pretty sedated. Atrovent was offered for siallorhea.   2. Insomnia. The patient slept fairly well with 30 mg of Restoril nightly but only 3 hours with 15 mg last night. We wil increase Restoril back to 30 mg.    3. Hypertension. We continued metoprolol.  4. Diabetes. We continued insulin Lantus, metformin, and ADA diet. The patient was noncompliant with diabetes treatment in the community. Living well with diabetes booklet was provided.  5. Smoking. Nicotine patch was available.   6. Metabolic syndrome monitoring. Lipid panel  and TSH were normal. Hemoglobin A1c elevated 10.9.  7. EKG. Normal sinus rhythm, EDC 437.   8. Sleep apnea. The patient refused to use CPAP on the unit.  9. Disposition. The patient was originally accepted to a group home as his elderly parents no longer feel they are safe around the patient when he gets agitated. Unfortunately the group home refused to take the patient at the last minute. The family kindly agreed to take him back. They started guardianship process and will try to place him later. I hope that they will receive help from P[facility] act team that the patient will follow up with.   Physical Findings: AIMS:  , ,  ,  ,    CIWA:    COWS:     Musculoskeletal: Strength & Muscle Tone: within normal limits Gait & Station: normal Patient leans: N/A  Psychiatric Specialty Exam: Physical Exam  Nursing note and vitals reviewed. Psychiatric: He has a normal mood and affect. His speech is normal. Thought content normal. He is withdrawn. Cognition and  memory are normal. He expresses impulsivity.    Review of Systems  Neurological: Negative.   Psychiatric/Behavioral: Negative.   All other systems reviewed and are negative.   Blood pressure 118/63, pulse 94, temperature 97.7 F (36.5 C), temperature source Oral, resp. rate 18, height  (1.753 m), weight 111.6 kg (246 lb), SpO2 98 %.Body mass index is 36.33 kg/m.  General Appearance: Casual  Eye Contact:  Good  Speech:  Clear and Coherent  Volume:  Normal  Mood:  Euthymic  Affect:  Blunt  Thought Process:  Goal Directed and Descriptions of Associations: Intact  Orientation:  Full (Time, Place, and Person)  Thought Content:  WDL  Suicidal Thoughts:  No  Homicidal Thoughts:  No  Memory:  Immediate;   Fair Recent;   Fair Remote;   Fair  Judgement:  Poor  Insight:  Lacking  Psychomotor Activity:  Decreased  Concentration:  Concentration: Fair and Attention Span: Fair  Recall:  [employer] of Knowledge:  Fair  Language:  Fair   Akathisia:  No  Handed:  Right  AIMS (if indicated):     Assets:  Communication Skills Desire for Improvement Financial Resources/Insurance Housing Physical Health Resilience Social Support  ADL's:  Intact  Cognition:  WNL  Sleep:  Number of Hours: 3.15     Have you used any form of tobacco in the last 30 days? (Cigarettes, Smokeless Tobacco, Cigars, and/or Pipes): Yes  Has this patient used any form of tobacco in the last 30 days? (Cigarettes, Smokeless Tobacco, Cigars, and/or Pipes) Yes, Yes, A prescription for an FDA-approved tobacco cessation medication was offered at discharge and the patient refused  Blood Alcohol level:  Lab Results  Component Value Date   [facility] <5 04/14/2017   ETH <5 10/06/2015    Metabolic Disorder Labs:  Lab Results  Component Value Date   HGBA1C 10.9 (H) 04/17/2017   MPG 266.13 04/17/2017   MPG 154 02/09/2015   Lab Results  Component Value Date   PROLACTIN 43.3 (H) 04/17/2017   PROLACTIN 45.2 (H) 10/07/2015   Lab Results  Component Value Date   CHOL 103 04/17/2017   TRIG 110 04/17/2017   HDL 34 (L) 04/17/2017   CHOLHDL 3.0 04/17/2017   VLDL 22 04/17/2017   LDLCALC 47 04/17/2017   LDLCALC 44 10/07/2015    See Psychiatric Specialty Exam and Suicide Risk Assessment completed by Attending Physician prior to discharge.  Discharge destination:  Home  Is patient on multiple antipsychotic therapies at discharge:  Yes,   Do you recommend tapering to monotherapy for antipsychotics?  No   Has Patient had three or more failed trials of antipsychotic monotherapy by history:  Yes,   Antipsychotic medications that previously failed include:   1.  zyorexa., 2.  risperdal. and 3.  haldol.  Recommended Plan for Multiple Antipsychotic Therapies: Second antipsychotic is Clozapine.  Reason for adding Clozapine inadequate response to a single agent.  Discharge Instructions    Diet - low sodium heart healthy    Complete by:  As directed    Increase  activity slowly    Complete by:  As directed      Allergies as of 05/09/2017      Reactions   Haldol [haloperidol] Anaphylaxis   Seroquel [quetiapine Fumarate] Other (See Comments)   Reaction: morbid, depressed      Medication List    STOP taking these medications   amantadine 100 MG capsule Commonly known as:  SYMMETREL   benztropine 2  MG tablet Commonly known as:  COGENTIN   propranolol 20 MG tablet Commonly known as:  INDERAL     TAKE these medications     Indication  clozapine 200 MG tablet Commonly known as:  CLOZARIL Take 1 tablet (200 mg total) by mouth at bedtime.  Indication:  Schizophrenia that does Not Respond to Usual Drug Therapy   divalproex 250 MG DR tablet Commonly known as:  DEPAKOTE Take 3 tablets (750 mg total) by mouth every 12 (twelve) hours.  Indication:  Manic Phase of Manic-Depression   insulin glargine 100 UNIT/ML injection Commonly known as:  LANTUS Inject 0.1 mLs (10 Units total) into the skin daily.  Indication:  Type 2 Diabetes   ipratropium 0.06 % nasal spray Commonly known as:  ATROVENT Place 2 sprays into both nostrils at bedtime. Apply under tha tongue for drooling.  Indication:  drooling   lithium carbonate 450 MG CR tablet Commonly known as:  ESKALITH Take 1 tablet (450 mg total) by mouth 2 (two) times daily. What changed:  Another medication with the same name was removed. Continue taking this medication, and follow the directions you see here.  Indication:  Mania   metFORMIN 1000 MG tablet Commonly known as:  GLUCOPHAGE Take 1 tablet (1,000 mg total) by mouth 2 (two) times daily with a meal.  Indication:  Antipsychotic Therapy-Induced Weight Gain, Type 2 Diabetes   metoprolol succinate 50 MG 24 hr tablet Commonly known as:  TOPROL-XL Take 1 tablet (50 mg total) by mouth daily. Take with or immediately following a meal.  Indication:  High Blood Pressure Disorder   paliperidone 234 MG/1.5ML Susp injection Commonly known  as:  INVEGA SUSTENNA Inject 234 mg into the muscle every 28 (twenty-eight) days. Next dose on 05/20/2017.  Indication:  Schizophrenia   temazepam 30 MG capsule Commonly known as:  RESTORIL Take 1 capsule (30 mg total) by mouth at bedtime.  Indication:  Trouble Sleeping        Follow-up recommendations:  Activity:  as tolerated. Diet:  low sodium heart healthy ADA diet. Other:  keep follow up appointments.  Comments:    Signed: Kristine Linea, MD 05/09/2017, 8:33 AM

## 2017-05-09 NOTE — Progress Notes (Signed)
Denies SI/HI/AVH. Discharge instructions given, verbalized understanding.  Prescriptions given and personal belongings returned.  Escorted off unit to main entrance to travel home with mother.

## 2017-05-21 DIAGNOSIS — F319 Bipolar disorder, unspecified: Secondary | ICD-10-CM | POA: Diagnosis not present

## 2017-05-21 DIAGNOSIS — F250 Schizoaffective disorder, bipolar type: Secondary | ICD-10-CM | POA: Diagnosis not present

## 2017-06-04 DIAGNOSIS — F250 Schizoaffective disorder, bipolar type: Secondary | ICD-10-CM | POA: Diagnosis not present

## 2017-06-04 DIAGNOSIS — Z79899 Other long term (current) drug therapy: Secondary | ICD-10-CM | POA: Diagnosis not present

## 2017-06-04 DIAGNOSIS — F319 Bipolar disorder, unspecified: Secondary | ICD-10-CM | POA: Diagnosis not present

## 2017-06-11 DIAGNOSIS — F250 Schizoaffective disorder, bipolar type: Secondary | ICD-10-CM | POA: Diagnosis not present

## 2017-06-11 DIAGNOSIS — F319 Bipolar disorder, unspecified: Secondary | ICD-10-CM | POA: Diagnosis not present

## 2017-06-18 DIAGNOSIS — F319 Bipolar disorder, unspecified: Secondary | ICD-10-CM | POA: Diagnosis not present

## 2017-06-18 DIAGNOSIS — Z79899 Other long term (current) drug therapy: Secondary | ICD-10-CM | POA: Diagnosis not present

## 2017-06-18 DIAGNOSIS — F250 Schizoaffective disorder, bipolar type: Secondary | ICD-10-CM | POA: Diagnosis not present

## 2017-06-25 DIAGNOSIS — Z79899 Other long term (current) drug therapy: Secondary | ICD-10-CM | POA: Diagnosis not present

## 2017-06-25 DIAGNOSIS — F250 Schizoaffective disorder, bipolar type: Secondary | ICD-10-CM | POA: Diagnosis not present

## 2017-07-02 DIAGNOSIS — Z79899 Other long term (current) drug therapy: Secondary | ICD-10-CM | POA: Diagnosis not present

## 2017-07-02 DIAGNOSIS — F250 Schizoaffective disorder, bipolar type: Secondary | ICD-10-CM | POA: Diagnosis not present

## 2017-07-09 DIAGNOSIS — F250 Schizoaffective disorder, bipolar type: Secondary | ICD-10-CM | POA: Diagnosis not present

## 2017-07-09 DIAGNOSIS — Z79899 Other long term (current) drug therapy: Secondary | ICD-10-CM | POA: Diagnosis not present

## 2017-07-16 DIAGNOSIS — F250 Schizoaffective disorder, bipolar type: Secondary | ICD-10-CM | POA: Diagnosis not present

## 2017-08-06 DIAGNOSIS — Z79899 Other long term (current) drug therapy: Secondary | ICD-10-CM | POA: Diagnosis not present

## 2017-08-06 DIAGNOSIS — F250 Schizoaffective disorder, bipolar type: Secondary | ICD-10-CM | POA: Diagnosis not present

## 2017-08-06 DIAGNOSIS — F319 Bipolar disorder, unspecified: Secondary | ICD-10-CM | POA: Diagnosis not present

## 2017-08-13 DIAGNOSIS — F319 Bipolar disorder, unspecified: Secondary | ICD-10-CM | POA: Diagnosis not present

## 2017-08-13 DIAGNOSIS — F250 Schizoaffective disorder, bipolar type: Secondary | ICD-10-CM | POA: Diagnosis not present

## 2017-08-20 DIAGNOSIS — F319 Bipolar disorder, unspecified: Secondary | ICD-10-CM | POA: Diagnosis not present

## 2017-08-20 DIAGNOSIS — F250 Schizoaffective disorder, bipolar type: Secondary | ICD-10-CM | POA: Diagnosis not present

## 2017-08-27 DIAGNOSIS — F250 Schizoaffective disorder, bipolar type: Secondary | ICD-10-CM | POA: Diagnosis not present

## 2017-08-27 DIAGNOSIS — Z79899 Other long term (current) drug therapy: Secondary | ICD-10-CM | POA: Diagnosis not present

## 2017-09-03 DIAGNOSIS — F319 Bipolar disorder, unspecified: Secondary | ICD-10-CM | POA: Diagnosis not present

## 2017-09-03 DIAGNOSIS — F250 Schizoaffective disorder, bipolar type: Secondary | ICD-10-CM | POA: Diagnosis not present

## 2017-09-10 DIAGNOSIS — F250 Schizoaffective disorder, bipolar type: Secondary | ICD-10-CM | POA: Diagnosis not present

## 2017-09-10 DIAGNOSIS — F319 Bipolar disorder, unspecified: Secondary | ICD-10-CM | POA: Diagnosis not present

## 2017-09-17 DIAGNOSIS — F250 Schizoaffective disorder, bipolar type: Secondary | ICD-10-CM | POA: Diagnosis not present

## 2017-09-17 DIAGNOSIS — F319 Bipolar disorder, unspecified: Secondary | ICD-10-CM | POA: Diagnosis not present

## 2017-09-24 DIAGNOSIS — F250 Schizoaffective disorder, bipolar type: Secondary | ICD-10-CM | POA: Diagnosis not present

## 2017-10-01 DIAGNOSIS — F250 Schizoaffective disorder, bipolar type: Secondary | ICD-10-CM | POA: Diagnosis not present

## 2017-10-01 DIAGNOSIS — F319 Bipolar disorder, unspecified: Secondary | ICD-10-CM | POA: Diagnosis not present

## 2017-10-08 DIAGNOSIS — F319 Bipolar disorder, unspecified: Secondary | ICD-10-CM | POA: Diagnosis not present

## 2017-10-08 DIAGNOSIS — F250 Schizoaffective disorder, bipolar type: Secondary | ICD-10-CM | POA: Diagnosis not present

## 2017-10-15 DIAGNOSIS — F319 Bipolar disorder, unspecified: Secondary | ICD-10-CM | POA: Diagnosis not present

## 2017-10-15 DIAGNOSIS — F250 Schizoaffective disorder, bipolar type: Secondary | ICD-10-CM | POA: Diagnosis not present

## 2017-10-22 DIAGNOSIS — F319 Bipolar disorder, unspecified: Secondary | ICD-10-CM | POA: Diagnosis not present

## 2017-10-22 DIAGNOSIS — F250 Schizoaffective disorder, bipolar type: Secondary | ICD-10-CM | POA: Diagnosis not present

## 2017-10-29 DIAGNOSIS — F319 Bipolar disorder, unspecified: Secondary | ICD-10-CM | POA: Diagnosis not present

## 2017-10-29 DIAGNOSIS — F250 Schizoaffective disorder, bipolar type: Secondary | ICD-10-CM | POA: Diagnosis not present

## 2017-11-05 DIAGNOSIS — F319 Bipolar disorder, unspecified: Secondary | ICD-10-CM | POA: Diagnosis not present

## 2017-11-05 DIAGNOSIS — F250 Schizoaffective disorder, bipolar type: Secondary | ICD-10-CM | POA: Diagnosis not present

## 2017-11-19 DIAGNOSIS — F250 Schizoaffective disorder, bipolar type: Secondary | ICD-10-CM | POA: Diagnosis not present

## 2017-11-19 DIAGNOSIS — F319 Bipolar disorder, unspecified: Secondary | ICD-10-CM | POA: Diagnosis not present

## 2017-11-26 DIAGNOSIS — F319 Bipolar disorder, unspecified: Secondary | ICD-10-CM | POA: Diagnosis not present

## 2017-11-26 DIAGNOSIS — F250 Schizoaffective disorder, bipolar type: Secondary | ICD-10-CM | POA: Diagnosis not present

## 2017-12-03 DIAGNOSIS — F250 Schizoaffective disorder, bipolar type: Secondary | ICD-10-CM | POA: Diagnosis not present

## 2017-12-03 DIAGNOSIS — F319 Bipolar disorder, unspecified: Secondary | ICD-10-CM | POA: Diagnosis not present

## 2017-12-18 DIAGNOSIS — F250 Schizoaffective disorder, bipolar type: Secondary | ICD-10-CM | POA: Diagnosis not present

## 2017-12-18 DIAGNOSIS — F319 Bipolar disorder, unspecified: Secondary | ICD-10-CM | POA: Diagnosis not present

## 2017-12-31 DIAGNOSIS — F319 Bipolar disorder, unspecified: Secondary | ICD-10-CM | POA: Diagnosis not present

## 2017-12-31 DIAGNOSIS — F250 Schizoaffective disorder, bipolar type: Secondary | ICD-10-CM | POA: Diagnosis not present

## 2018-01-14 DIAGNOSIS — F319 Bipolar disorder, unspecified: Secondary | ICD-10-CM | POA: Diagnosis not present

## 2018-01-14 DIAGNOSIS — F250 Schizoaffective disorder, bipolar type: Secondary | ICD-10-CM | POA: Diagnosis not present

## 2018-01-28 DIAGNOSIS — F250 Schizoaffective disorder, bipolar type: Secondary | ICD-10-CM | POA: Diagnosis not present

## 2018-01-28 DIAGNOSIS — F319 Bipolar disorder, unspecified: Secondary | ICD-10-CM | POA: Diagnosis not present

## 2018-02-04 DIAGNOSIS — F250 Schizoaffective disorder, bipolar type: Secondary | ICD-10-CM | POA: Diagnosis not present

## 2018-02-04 DIAGNOSIS — F319 Bipolar disorder, unspecified: Secondary | ICD-10-CM | POA: Diagnosis not present

## 2018-02-12 DIAGNOSIS — F250 Schizoaffective disorder, bipolar type: Secondary | ICD-10-CM | POA: Diagnosis not present

## 2018-02-18 DIAGNOSIS — F319 Bipolar disorder, unspecified: Secondary | ICD-10-CM | POA: Diagnosis not present

## 2018-02-18 DIAGNOSIS — F250 Schizoaffective disorder, bipolar type: Secondary | ICD-10-CM | POA: Diagnosis not present

## 2018-03-05 DIAGNOSIS — Z79899 Other long term (current) drug therapy: Secondary | ICD-10-CM | POA: Diagnosis not present

## 2018-03-05 DIAGNOSIS — F250 Schizoaffective disorder, bipolar type: Secondary | ICD-10-CM | POA: Diagnosis not present

## 2018-03-20 DIAGNOSIS — Z79899 Other long term (current) drug therapy: Secondary | ICD-10-CM | POA: Diagnosis not present

## 2018-03-20 DIAGNOSIS — F319 Bipolar disorder, unspecified: Secondary | ICD-10-CM | POA: Diagnosis not present

## 2018-04-04 DIAGNOSIS — F250 Schizoaffective disorder, bipolar type: Secondary | ICD-10-CM | POA: Diagnosis not present

## 2018-04-04 DIAGNOSIS — Z79899 Other long term (current) drug therapy: Secondary | ICD-10-CM | POA: Diagnosis not present

## 2018-04-04 DIAGNOSIS — F319 Bipolar disorder, unspecified: Secondary | ICD-10-CM | POA: Diagnosis not present

## 2018-04-22 DIAGNOSIS — Z79899 Other long term (current) drug therapy: Secondary | ICD-10-CM | POA: Diagnosis not present

## 2018-04-22 DIAGNOSIS — F250 Schizoaffective disorder, bipolar type: Secondary | ICD-10-CM | POA: Diagnosis not present

## 2018-04-22 DIAGNOSIS — F319 Bipolar disorder, unspecified: Secondary | ICD-10-CM | POA: Diagnosis not present

## 2018-05-15 DIAGNOSIS — F319 Bipolar disorder, unspecified: Secondary | ICD-10-CM | POA: Diagnosis not present

## 2018-05-15 DIAGNOSIS — F250 Schizoaffective disorder, bipolar type: Secondary | ICD-10-CM | POA: Diagnosis not present

## 2018-05-19 DIAGNOSIS — F319 Bipolar disorder, unspecified: Secondary | ICD-10-CM | POA: Diagnosis not present

## 2018-05-19 DIAGNOSIS — F250 Schizoaffective disorder, bipolar type: Secondary | ICD-10-CM | POA: Diagnosis not present

## 2018-05-19 DIAGNOSIS — Z79899 Other long term (current) drug therapy: Secondary | ICD-10-CM | POA: Diagnosis not present

## 2018-06-03 DIAGNOSIS — F250 Schizoaffective disorder, bipolar type: Secondary | ICD-10-CM | POA: Diagnosis not present

## 2018-06-03 DIAGNOSIS — F319 Bipolar disorder, unspecified: Secondary | ICD-10-CM | POA: Diagnosis not present

## 2018-06-17 DIAGNOSIS — F250 Schizoaffective disorder, bipolar type: Secondary | ICD-10-CM | POA: Diagnosis not present

## 2018-06-17 DIAGNOSIS — Z79899 Other long term (current) drug therapy: Secondary | ICD-10-CM | POA: Diagnosis not present

## 2018-06-17 DIAGNOSIS — F319 Bipolar disorder, unspecified: Secondary | ICD-10-CM | POA: Diagnosis not present

## 2018-07-21 DIAGNOSIS — F319 Bipolar disorder, unspecified: Secondary | ICD-10-CM | POA: Diagnosis not present

## 2018-07-21 DIAGNOSIS — Z79899 Other long term (current) drug therapy: Secondary | ICD-10-CM | POA: Diagnosis not present

## 2018-07-21 DIAGNOSIS — F250 Schizoaffective disorder, bipolar type: Secondary | ICD-10-CM | POA: Diagnosis not present

## 2018-08-07 DIAGNOSIS — F250 Schizoaffective disorder, bipolar type: Secondary | ICD-10-CM | POA: Diagnosis not present

## 2018-08-07 DIAGNOSIS — F319 Bipolar disorder, unspecified: Secondary | ICD-10-CM | POA: Diagnosis not present

## 2018-09-02 DIAGNOSIS — F319 Bipolar disorder, unspecified: Secondary | ICD-10-CM | POA: Diagnosis not present

## 2018-09-02 DIAGNOSIS — F250 Schizoaffective disorder, bipolar type: Secondary | ICD-10-CM | POA: Diagnosis not present

## 2018-10-28 DIAGNOSIS — F250 Schizoaffective disorder, bipolar type: Secondary | ICD-10-CM | POA: Diagnosis not present

## 2019-01-06 DIAGNOSIS — F250 Schizoaffective disorder, bipolar type: Secondary | ICD-10-CM | POA: Diagnosis not present

## 2019-01-06 DIAGNOSIS — F319 Bipolar disorder, unspecified: Secondary | ICD-10-CM | POA: Diagnosis not present

## 2019-01-27 DIAGNOSIS — F250 Schizoaffective disorder, bipolar type: Secondary | ICD-10-CM | POA: Diagnosis not present

## 2019-03-13 DIAGNOSIS — F250 Schizoaffective disorder, bipolar type: Secondary | ICD-10-CM | POA: Diagnosis not present

## 2019-03-13 DIAGNOSIS — F319 Bipolar disorder, unspecified: Secondary | ICD-10-CM | POA: Diagnosis not present

## 2019-04-21 DIAGNOSIS — F250 Schizoaffective disorder, bipolar type: Secondary | ICD-10-CM | POA: Diagnosis not present

## 2019-06-05 DIAGNOSIS — F419 Anxiety disorder, unspecified: Secondary | ICD-10-CM | POA: Diagnosis not present

## 2019-06-05 DIAGNOSIS — F250 Schizoaffective disorder, bipolar type: Secondary | ICD-10-CM | POA: Diagnosis not present

## 2019-09-22 DIAGNOSIS — F250 Schizoaffective disorder, bipolar type: Secondary | ICD-10-CM | POA: Diagnosis not present

## 2019-09-22 DIAGNOSIS — F419 Anxiety disorder, unspecified: Secondary | ICD-10-CM | POA: Diagnosis not present

## 2019-10-06 DIAGNOSIS — F250 Schizoaffective disorder, bipolar type: Secondary | ICD-10-CM | POA: Diagnosis not present

## 2019-12-09 DIAGNOSIS — F419 Anxiety disorder, unspecified: Secondary | ICD-10-CM | POA: Diagnosis not present

## 2019-12-09 DIAGNOSIS — F250 Schizoaffective disorder, bipolar type: Secondary | ICD-10-CM | POA: Diagnosis not present

## 2020-03-03 DIAGNOSIS — F250 Schizoaffective disorder, bipolar type: Secondary | ICD-10-CM | POA: Diagnosis not present

## 2020-03-03 DIAGNOSIS — F419 Anxiety disorder, unspecified: Secondary | ICD-10-CM | POA: Diagnosis not present

## 2020-04-15 DIAGNOSIS — Z23 Encounter for immunization: Secondary | ICD-10-CM | POA: Diagnosis not present

## 2020-05-05 DIAGNOSIS — Z23 Encounter for immunization: Secondary | ICD-10-CM | POA: Diagnosis not present

## 2020-05-26 DIAGNOSIS — F250 Schizoaffective disorder, bipolar type: Secondary | ICD-10-CM | POA: Diagnosis not present

## 2020-05-26 DIAGNOSIS — F419 Anxiety disorder, unspecified: Secondary | ICD-10-CM | POA: Diagnosis not present

## 2020-06-22 DIAGNOSIS — F250 Schizoaffective disorder, bipolar type: Secondary | ICD-10-CM | POA: Diagnosis not present

## 2020-11-28 DIAGNOSIS — F250 Schizoaffective disorder, bipolar type: Secondary | ICD-10-CM | POA: Diagnosis not present

## 2020-12-06 DIAGNOSIS — F250 Schizoaffective disorder, bipolar type: Secondary | ICD-10-CM | POA: Diagnosis not present

## 2021-02-02 DIAGNOSIS — F419 Anxiety disorder, unspecified: Secondary | ICD-10-CM | POA: Diagnosis not present

## 2021-02-02 DIAGNOSIS — F250 Schizoaffective disorder, bipolar type: Secondary | ICD-10-CM | POA: Diagnosis not present

## 2021-03-23 DIAGNOSIS — F250 Schizoaffective disorder, bipolar type: Secondary | ICD-10-CM | POA: Diagnosis not present

## 2021-04-12 DIAGNOSIS — Z23 Encounter for immunization: Secondary | ICD-10-CM | POA: Diagnosis not present

## 2021-04-27 DIAGNOSIS — F250 Schizoaffective disorder, bipolar type: Secondary | ICD-10-CM | POA: Diagnosis not present

## 2021-05-21 DIAGNOSIS — Z23 Encounter for immunization: Secondary | ICD-10-CM | POA: Diagnosis not present

## 2021-06-13 DIAGNOSIS — F250 Schizoaffective disorder, bipolar type: Secondary | ICD-10-CM | POA: Diagnosis not present

## 2021-07-10 DIAGNOSIS — F250 Schizoaffective disorder, bipolar type: Secondary | ICD-10-CM | POA: Diagnosis not present

## 2021-07-10 DIAGNOSIS — F419 Anxiety disorder, unspecified: Secondary | ICD-10-CM | POA: Diagnosis not present

## 2024-04-21 ENCOUNTER — Telehealth: Payer: Self-pay

## 2024-04-21 NOTE — Telephone Encounter (Signed)
 I left a voicemail from [employer] at [place] station regarding new patient appointment at [facility].  Please plan to arrive by 2:10 to complete new patient paperwork, if you have not already completed the packet.  I left our number in case patient has questions.  Patient does not have cell phone or email listed, and does not have MyChart, so he will not receive any of our notifications regarding new patient paperwork.

## 2024-04-22 ENCOUNTER — Encounter: Payer: Self-pay | Admitting: Internal Medicine

## 2024-04-22 ENCOUNTER — Ambulatory Visit (INDEPENDENT_AMBULATORY_CARE_PROVIDER_SITE_OTHER): Admitting: Internal Medicine

## 2024-04-22 VITALS — HR 101 | Ht 69.0 in | Wt 189.2 lb

## 2024-04-22 DIAGNOSIS — E785 Hyperlipidemia, unspecified: Secondary | ICD-10-CM

## 2024-04-22 DIAGNOSIS — F250 Schizoaffective disorder, bipolar type: Secondary | ICD-10-CM

## 2024-04-22 DIAGNOSIS — I10 Essential (primary) hypertension: Secondary | ICD-10-CM | POA: Diagnosis not present

## 2024-04-22 DIAGNOSIS — E119 Type 2 diabetes mellitus without complications: Secondary | ICD-10-CM | POA: Diagnosis not present

## 2024-04-22 DIAGNOSIS — R29898 Other symptoms and signs involving the musculoskeletal system: Secondary | ICD-10-CM

## 2024-04-22 DIAGNOSIS — R000 Tachycardia, unspecified: Secondary | ICD-10-CM

## 2024-04-22 DIAGNOSIS — Z125 Encounter for screening for malignant neoplasm of prostate: Secondary | ICD-10-CM

## 2024-04-22 DIAGNOSIS — F3181 Bipolar II disorder: Secondary | ICD-10-CM

## 2024-04-22 DIAGNOSIS — G629 Polyneuropathy, unspecified: Secondary | ICD-10-CM | POA: Diagnosis not present

## 2024-04-22 DIAGNOSIS — E861 Hypovolemia: Secondary | ICD-10-CM | POA: Diagnosis not present

## 2024-04-22 DIAGNOSIS — G251 Drug-induced tremor: Secondary | ICD-10-CM

## 2024-04-22 DIAGNOSIS — Z79899 Other long term (current) drug therapy: Secondary | ICD-10-CM

## 2024-04-22 NOTE — Patient Instructions (Addendum)
 YOUR HEART RHYTHM IS NORMAL  YOUR RIGHT LEG WEAKNESS IS CONCERNING FOR A NEUROPATHY SO I AM SENDING YOU TO A NEUROLOGIST  STOP THE PROPANOL OL  UNTIL HIS  BP I S 130  OR HIGHER

## 2024-04-22 NOTE — Progress Notes (Unsigned)
 Subjective:  Patient ID: Greg GORMAN Gerardine Mickey., male    DOB: March 23, 1966  Age: 58 y.o. MRN: [id number]  CC: The primary encounter diagnosis was Essential hypertension. Diagnoses of Type 2 diabetes mellitus without complication, without long-term current use of insulin  (HCC), Dyslipidemia, and Long-term use of high-risk medication were also pertinent to this visit.  HPI Greg GORMAN Gerardine Mickey. presents for establishment of care . He is accompanied by his mother   Greg [name]  is a 42 yr old meale with schizophrenia  and bipolar disorder, currently treated by [facility] NP Prentice Plant  with monthly injections of Invega , and daily use of oral  benztropine,  divalproax (500 mg daily) , Lithum 900 mg daily and paliperidone  1.5 mg at bedtime    he is dipahoretic and wak today and states that he has been feeling dizzy but denies vertigo  he has had nothing to eat or drink today   1) He started limping 5 weeks ago and reported pain in his right leg he noticed weakness of right foot, so he stopped driving . He has had a fall due to leg giving out. Landed on knee and sprained his ankle . Has not had any evaluation   Exam;  weak hip flexors weak,  no foot drop  decreased sensation to microfilament,  has peroneal  nerve problem?  Has also  started feeling dizzy today after leaving the house  2) he has  diet controled diabetes  with no recent monitoring of  diabetes (no labs since 2018) and does not check bs  accucheck today is 204    3) he has had Tremors since lithium  started in 1990, currenlty taking propranolol  20 mg tid   4( he has had a 60 lb weight loss since  2018, 40 lbs was intentional.  Per mother he has lost 20 lbs since his last Mental health weigh in 4 months ago. Due to decreased appetite .    His psychiatric illness is managed by NP Gayleen Ring at [name] . All visit s have been virtual since COVID except  for the monthly injection of Invega   NO  LABS SINCE 2018 HOSPITALIZATION   60 B WEIGHT LOSS SINCE 2018.  Intentionally except for the 20 LBS IN 4 MONTHS   No prior screening for colon CA or prostate CA.  Used to smokd 1 pack of cigs per week but dips tobacco . No dental follow up in 3 yrs.   Needs medicaid    History Greg [name] has a past medical history of Bipolar 1 disorder (HCC), Diabetes (HCC), Dyslipidemia, HTN (hypertension), Obesity, Paranoia (HCC), and Schizo affective schizophrenia (HCC).   He has a past surgical history that includes Tonsillectomy (58 year old); Cholecystectomy (N/A, 03/30/2016); and Appendectomy (58 years old).   His family history includes Depression in his father, mother, and sister; Mental illness in his father, mother, and sister.He reports that he has been smoking cigarettes. He has a 4 pack-year smoking history. His smokeless tobacco use includes snuff. He reports that he does not drink alcohol and does not use drugs.  Outpatient Medications Prior to Visit  Medication Sig Dispense Refill  . clozapine  (CLOZARIL ) 200 MG tablet Take 1 tablet (200 mg total) by mouth at bedtime. 30 tablet 1  . divalproex  (DEPAKOTE ) 250 MG DR tablet Take 3 tablets (750 mg total) by mouth every 12 (twelve) hours. 180 tablet 1  . insulin  glargine (LANTUS ) 100 UNIT/ML injection Inject 0.1 mLs (10  Units total) into the skin daily. 10 mL 1  . ipratropium (ATROVENT ) 0.06 % nasal spray Place 2 sprays into both nostrils at bedtime. Apply under tha tongue for drooling. 15 mL 12  . lithium  carbonate (ESKALITH ) 450 MG CR tablet Take 1 tablet (450 mg total) by mouth 2 (two) times daily. 60 tablet 1  . metFORMIN  (GLUCOPHAGE ) 1000 MG tablet Take 1 tablet (1,000 mg total) by mouth 2 (two) times daily with a meal. 60 tablet 1  . metoprolol  succinate (TOPROL -XL) 50 MG 24 hr tablet Take 1 tablet (50 mg total) by mouth daily. Take with or immediately following a meal. 30 tablet 1  . paliperidone  (INVEGA  SUSTENNA) 234 MG/1.5ML SUSP injection Inject  234 mg into the muscle every 28 (twenty-eight) days. Next dose on 05/20/2017. 0.9 mL 1  . temazepam  (RESTORIL ) 30 MG capsule Take 1 capsule (30 mg total) by mouth at bedtime. 30 capsule 0   No facility-administered medications prior to visit.    Review of Systems:  Patient denies headache, fevers, malaise, unintentional weight loss, skin rash, eye pain, sinus congestion and sinus pain, sore throat, dysphagia,  hemoptysis , cough, dyspnea, wheezing, chest pain, palpitations, orthopnea, edema, abdominal pain, nausea, melena, diarrhea, constipation, flank pain, dysuria, hematuria, urinary  Frequency, nocturia, numbness, tingling, seizures,  Focal weakness, Loss of consciousness,  Tremor, insomnia, depression, anxiety, and suicidal ideation.     Objective:  There were no vitals taken for this visit.  Physical Exam Vitals reviewed.  Constitutional:      General: He is not in acute distress.    Appearance: Normal appearance. He is normal weight. He is ill-appearing. He is not toxic-appearing or diaphoretic.  HENT:     Head: Normocephalic.  Eyes:     General: No scleral icterus.       Right eye: No discharge.        Left eye: No discharge.     Conjunctiva/sclera: Conjunctivae normal.  Cardiovascular:     Rate and Rhythm: Normal rate and regular rhythm.     Heart sounds: Normal heart sounds.  Pulmonary:     Effort: Pulmonary effort is normal. No respiratory distress.     Breath sounds: Normal breath sounds.  Musculoskeletal:        General: Normal range of motion.     Cervical back: Normal range of motion.  Skin:    General: Skin is warm and dry.  Neurological:     General: No focal deficit present.     Mental Status: He is alert and oriented to person, place, and time. Mental status is at baseline.     Cranial Nerves: Cranial nerves 2-12 are intact.     Sensory: No sensory deficit.     Deep Tendon Reflexes:     Reflex Scores:      Bicep reflexes are 2+ on the right side and 2+ on  the left side.      Patellar reflexes are 3+ on the right side and 3+ on the left side.      Achilles reflexes are 0 on the right side and 0 on the left side.    Comments: Sensation loss to microfilatment over top of right foot in peroneal nerve distribution   Psychiatric:        Mood and Affect: Mood normal.        Behavior: Behavior normal.        Thought Content: Thought content normal.        Judgment: Judgment  normal.    Assessment & Plan:  Essential hypertension  Type 2 diabetes mellitus without complication, without long-term current use of insulin  (HCC)  Dyslipidemia  Long-term use of high-risk medication     Follow-up: No follow-ups on file.   I provided 30 minutes during this encounter reviewing patient's last visit with previous provider,  most recent imaging studies and labs.  Provided counseling on the above mentioned problems and coordination of care.   Verneita LITTIE Kettering, MD

## 2024-04-23 ENCOUNTER — Encounter: Payer: Self-pay | Admitting: Internal Medicine

## 2024-04-23 DIAGNOSIS — E861 Hypovolemia: Secondary | ICD-10-CM | POA: Insufficient documentation

## 2024-04-23 DIAGNOSIS — R29898 Other symptoms and signs involving the musculoskeletal system: Secondary | ICD-10-CM | POA: Insufficient documentation

## 2024-04-23 DIAGNOSIS — R000 Tachycardia, unspecified: Secondary | ICD-10-CM | POA: Insufficient documentation

## 2024-04-23 LAB — B12 AND FOLATE PANEL
Folate: 12.7 ng/mL (ref >5.9)
Vitamin B-12: 529 pg/mL (ref 211–911)

## 2024-04-23 LAB — TSH: TSH: 4.62 u[IU]/mL (ref 0.35–5.50)

## 2024-04-23 LAB — CBC WITH DIFFERENTIAL/PLATELET
Basophils Absolute: 0.1 K/uL (ref 0.0–0.1)
Basophils Relative: 1 % (ref 0.0–3.0)
Eosinophils Absolute: 0.3 K/uL (ref 0.0–0.7)
Eosinophils Relative: 4.4 % (ref 0.0–5.0)
HCT: 42.6 % (ref 39.0–52.0)
Hemoglobin: 14.2 g/dL (ref 13.0–17.0)
Lymphocytes Relative: 25.9 % (ref 12.0–46.0)
Lymphs Abs: 2.1 K/uL (ref 0.7–4.0)
MCHC: 33.5 g/dL (ref 30.0–36.0)
MCV: 88.2 fl (ref 78.0–100.0)
Monocytes Absolute: 0.6 K/uL (ref 0.1–1.0)
Monocytes Relative: 7.2 % (ref 3.0–12.0)
Neutro Abs: 4.9 K/uL (ref 1.4–7.7)
Neutrophils Relative %: 61.5 % (ref 43.0–77.0)
Platelets: 269 K/uL (ref 150.0–400.0)
RBC: 4.83 Mil/uL (ref 4.22–5.81)
RDW: 13 % (ref 11.5–15.5)
WBC: 7.9 K/uL (ref 4.0–10.5)

## 2024-04-23 LAB — COMPREHENSIVE METABOLIC PANEL WITH GFR
ALT: 22 U/L (ref 0–53)
AST: 15 U/L (ref 0–37)
Albumin: 4.1 g/dL (ref 3.5–5.2)
Alkaline Phosphatase: 64 U/L (ref 39–117)
BUN: 19 mg/dL (ref 6–23)
CO2: 26 meq/L (ref 19–32)
Calcium: 9.9 mg/dL (ref 8.4–10.5)
Chloride: 100 meq/L (ref 96–112)
Creatinine, Ser: 0.97 mg/dL (ref 0.40–1.50)
GFR: 86.37 mL/min (ref >60.00)
Glucose, Bld: 196 mg/dL — ABNORMAL HIGH (ref 70–99)
Potassium: 4.6 meq/L (ref 3.5–5.1)
Sodium: 136 meq/L (ref 135–145)
Total Bilirubin: 0.6 mg/dL (ref 0.2–1.2)
Total Protein: 6.8 g/dL (ref 6.0–8.3)

## 2024-04-23 LAB — LIPID PANEL
Cholesterol: 113 mg/dL (ref 0–200)
HDL: 40.9 mg/dL (ref >39.00)
LDL Cholesterol: 53 mg/dL (ref 0–99)
NonHDL: 72.2
Total CHOL/HDL Ratio: 3
Triglycerides: 97 mg/dL (ref 0.0–149.0)
VLDL: 19.4 mg/dL (ref 0.0–40.0)

## 2024-04-23 LAB — PSA, MEDICARE: PSA: 0.76 ng/mL (ref 0.10–4.00)

## 2024-04-23 LAB — HEMOGLOBIN A1C: Hgb A1c MFr Bld: 9 % — ABNORMAL HIGH (ref 4.6–6.5)

## 2024-04-23 LAB — LDL CHOLESTEROL, DIRECT: Direct LDL: 62 mg/dL

## 2024-04-23 NOTE — Assessment & Plan Note (Signed)
 Presumed deue to dehdyration in the setting of chronic lithium  use.  Advised patient to be diligent about maintaining adequate hydration

## 2024-04-23 NOTE — Assessment & Plan Note (Signed)
 Etiology unclear.  History of pain onset several weeks apg with no history of injury, now with decreased strength in hip flexors without painful ROM by exam.  Neurology referral  for EMG/[place] studies advised.

## 2024-04-23 NOTE — Assessment & Plan Note (Signed)
 Untreated since 2018  all labs are pending.  Given his unintentional 20 lb weight loss in the past 4 months  suspecte it is uncontrolled

## 2024-04-23 NOTE — Assessment & Plan Note (Addendum)
 Managed by [facility] NP with a low dose of oral  Invega   and a monthly parenteral dose . He has had no relapses or hospitalizations tons since 2018.

## 2024-04-23 NOTE — Assessment & Plan Note (Signed)
 I have ordered and reviewed a 12 lead EKG and find that there are no acute changes and patient is in sinus  tachycardia  the computer generated rhythm analysis of atrial flutter is INCORRECT and affected by patient's resting tremor

## 2024-04-23 NOTE — Assessment & Plan Note (Signed)
 Secondary to use of lithium  and valproic  acid .  Suspend propranolol  given his hypotension and lack of effect given that his tremor is not due to BET.  SABRA

## 2024-04-23 NOTE — Assessment & Plan Note (Signed)
 Managed with [facility] NP with lithium  and valproic  acid.

## 2024-04-24 ENCOUNTER — Ambulatory Visit: Payer: Self-pay | Admitting: Internal Medicine

## 2024-04-24 DIAGNOSIS — R809 Proteinuria, unspecified: Secondary | ICD-10-CM

## 2024-05-01 ENCOUNTER — Ambulatory Visit

## 2024-05-07 ENCOUNTER — Encounter: Payer: Self-pay | Admitting: Neurology

## 2024-05-07 ENCOUNTER — Ambulatory Visit

## 2024-05-07 DIAGNOSIS — Z23 Encounter for immunization: Secondary | ICD-10-CM

## 2024-05-07 DIAGNOSIS — E119 Type 2 diabetes mellitus without complications: Secondary | ICD-10-CM

## 2024-05-07 NOTE — Progress Notes (Addendum)
 Pt came in for [facility] placement and teaching. Pt arrived with his mother. Pt was given a libre sensor and reader. Mother was instructed to return in 2 week to have meter downloaded for review.

## 2024-05-13 LAB — MICROALBUMIN / CREATININE URINE RATIO
Creatinine,U: 43.1 mg/dL
Microalb Creat Ratio: 32.2 mg/g — ABNORMAL HIGH (ref 0.0–30.0)
Microalb, Ur: 1.4 mg/dL (ref 0.0–1.9)

## 2024-05-15 NOTE — Assessment & Plan Note (Signed)
 Uncontrolled due to lack of medical care since 2018.  UaCr is > 30.  Will likely need to start insulin  for glycemic control and ARB for nephropathy.  Awaiting 2 weeks of date obained from CBG monitor

## 2024-05-19 NOTE — Progress Notes (Signed)
 Noted! Thank you

## 2024-05-21 ENCOUNTER — Encounter: Payer: Self-pay | Admitting: Nurse Practitioner

## 2024-05-21 ENCOUNTER — Ambulatory Visit (INDEPENDENT_AMBULATORY_CARE_PROVIDER_SITE_OTHER): Admitting: Nurse Practitioner

## 2024-05-21 VITALS — BP 114/82 | HR 109 | Temp 98.1°F | Ht 69.0 in | Wt 185.0 lb

## 2024-05-21 DIAGNOSIS — E1129 Type 2 diabetes mellitus with other diabetic kidney complication: Secondary | ICD-10-CM | POA: Diagnosis not present

## 2024-05-21 DIAGNOSIS — Z7984 Long term (current) use of oral hypoglycemic drugs: Secondary | ICD-10-CM | POA: Diagnosis not present

## 2024-05-21 DIAGNOSIS — R809 Proteinuria, unspecified: Secondary | ICD-10-CM

## 2024-05-21 MED ORDER — METFORMIN HCL ER 500 MG PO TB24: 500.0000 mg | ORAL_TABLET | Freq: Every day | ORAL | 1 refills | Status: DC

## 2024-05-21 MED ORDER — METFORMIN HCL ER 500 MG PO TB24: 500.0000 mg | ORAL_TABLET | Freq: Every day | ORAL | 1 refills | Status: AC

## 2024-05-21 NOTE — Progress Notes (Signed)
 Established Patient Office Visit  Subjective:  Patient ID: Greg [name]., male    DOB: January 05, 1966  Age: 58 y.o. MRN: [id number]  CC:  Chief Complaint  Patient presents with   Acute Visit    DM follow up   Discussed the use of a AI scribe software for clinical note transcription with the patient, who gave verbal consent to proceed.  HPI Greg [name]. is a 58 year old male with diabetes who presents for diabetes management accompanied with his mother.  His recent hemoglobin A1c level is 9% as of April 22, 2024. He has made dietary changes, eliminating sugar and reducing carbohydrate intake, and has stopped consuming two sodas daily and Ensure drinks.   Date of Download: 05/21/24 % Time CGM is active: 96 % Average Glucose: 140 mg/dL Glucose Management Indicator: 6.7 %  Glucose Variability: 18.8 (goal <36%) Time in Goal:  - Time in range 70-180: 93% - Time above range: 7% - Time below range: 93%  He is not taking any medication for diabetes control.  He was previously on metformin  years ago.   HPI   Past Medical History:  Diagnosis Date   Acute cholecystitis with chronic cholecystitis 03/27/2016   Allergy    Bipolar 1 disorder (HCC)    Diabetes (HCC)    Dyslipidemia    HTN (hypertension)    Obesity    Paranoia (HCC)    Schizo affective schizophrenia (HCC)     Past Surgical History:  Procedure Laterality Date   APPENDECTOMY  58 years old   CHOLECYSTECTOMY N/A 03/30/2016   Procedure: LAPAROSCOPIC CHOLECYSTECTOMY;  Surgeon: Dorothyann LITTIE Husk, MD;  Location: [facility] ORS;  Service: General;  Laterality: N/A;   TONSILLECTOMY  58 year old    Family History  Problem Relation Age of Onset   Hypertension Mother    Hyperlipidemia Mother    Cancer Mother    Depression Mother    Mental illness Mother    Stroke Father    Hypertension Father    Diabetes Father    Arthritis Father    Depression Father    Mental illness Father    Depression Sister     Mental illness Sister    Cancer Maternal Grandmother    Hyperlipidemia Maternal Grandmother    Arthritis Maternal Grandfather    Heart disease Paternal Grandmother    Arthritis Paternal Grandmother    Stroke Paternal Grandfather    Hypertension Paternal Grandfather    Cancer Paternal Grandfather    Heart attack Paternal Grandfather     Social History   Socioeconomic History   Marital status: Single    Spouse name: Not on file   Number of children: Not on file   Years of education: Not on file   Highest education level: Not on file  Occupational History   Not on file  Tobacco Use   Smoking status: Former    Current packs/day: 0.50    Average packs/day: 0.5 packs/day for 8.0 years (4.0 ttl pk-yrs)    Types: Cigarettes   Smokeless tobacco: Current    Types: Snuff  Substance and Sexual Activity   Alcohol use: No    Alcohol/week: 0.0 standard drinks of alcohol   Drug use: No   Sexual activity: Never  Other Topics Concern   Not on file  Social History Narrative   Not on file   Social Drivers of Health   Financial Resource Strain: Not on file  Food Insecurity: Not on  file  Transportation Needs: Not on file  Physical Activity: Not on file  Stress: Not on file  Social Connections: Not on file  Intimate Partner Violence: Not on file     Outpatient Medications Prior to Visit  Medication Sig Dispense Refill   benztropine (COGENTIN) 2 MG tablet Take 2 mg by mouth at bedtime.     divalproex  (DEPAKOTE ) 250 MG DR tablet Take 3 tablets (750 mg total) by mouth every 12 (twelve) hours. (Patient taking differently: Take 500 mg by mouth daily.) 180 tablet 1   INVEGA  TRINZA 819 MG/2.63ML injection Inject 819 mg into the muscle every 3 (three) months.     lithium  carbonate 300 MG capsule Take 300 mg by mouth 3 (three) times daily.     paliperidone  (INVEGA ) 1.5 MG 24 hr tablet Take 1.5 mg by mouth at bedtime.     propranolol  (INDERAL ) 20 MG tablet Take 20 mg by mouth 2 (two)  times daily.     No facility-administered medications prior to visit.    Allergies  Allergen Reactions   Haldol  [Haloperidol ] Anaphylaxis   Seroquel [Quetiapine Fumarate] Other (See Comments)    Reaction: morbid, depressed    ROS Review of Systems Negative unless indicated in HPI.    Objective:    Physical Exam Constitutional:      Appearance: Normal appearance.  Cardiovascular:     Rate and Rhythm: Normal rate and regular rhythm.     Pulses: Normal pulses.     Heart sounds: Normal heart sounds.  Pulmonary:     Effort: Pulmonary effort is normal.     Breath sounds: Normal breath sounds.  Musculoskeletal:     Cervical back: Normal range of motion. No tenderness.  Neurological:     General: No focal deficit present.     Mental Status: He is alert and oriented to person, place, and time. Mental status is at baseline.  Psychiatric:        Mood and Affect: Mood normal.        Behavior: Behavior normal.     BP 114/82   Pulse (!) 109   Temp 98.1 F (36.7 C)   Ht 5' 9 (1.753 m)   Wt 185 lb (83.9 kg)   SpO2 98%   BMI 27.32 kg/m  Wt Readings from Last 3 Encounters:  05/21/24 185 lb (83.9 kg)  04/22/24 189 lb 3.2 oz (85.8 kg)  04/14/17 260 lb (117.9 kg)     Health Maintenance  Topic Date Due   Medicare Annual Wellness (AWV)  Never done   FOOT EXAM  Never done   OPHTHALMOLOGY EXAM  Never done   HIV Screening  Never done   Hepatitis C Screening  Never done   DTaP/Tdap/Td (1 - Tdap) Never done   Pneumococcal Vaccine: 50+ Years (1 of 2 - PCV) Never done   Hepatitis B Vaccines 19-59 Average Risk (1 of 3 - 19+ 3-dose series) Never done   Zoster Vaccines- Shingrix (1 of 2) Never done   Colonoscopy  Never done   COVID-19 Vaccine (6 - 2025-26 season) 03/30/2024   HEMOGLOBIN A1C  10/20/2024   Diabetic kidney evaluation - eGFR measurement  04/22/2025   Diabetic kidney evaluation - Urine ACR  05/12/2025   Influenza Vaccine  Completed   HPV VACCINES  Aged Out    Meningococcal B Vaccine  Aged Out       Topic Date Due   Hepatitis B Vaccines 19-59 Average Risk (1 of 3 - 19+  3-dose series) Never done    Lab Results  Component Value Date   TSH 4.62 04/22/2024   Lab Results  Component Value Date   WBC 7.9 04/22/2024   HGB 14.2 04/22/2024   HCT 42.6 04/22/2024   MCV 88.2 04/22/2024   PLT 269.0 04/22/2024   Lab Results  Component Value Date   NA 136 04/22/2024   K 4.6 04/22/2024   CO2 26 04/22/2024   GLUCOSE 196 (H) 04/22/2024   BUN 19 04/22/2024   CREATININE 0.97 04/22/2024   BILITOT 0.6 04/22/2024   ALKPHOS 64 04/22/2024   AST 15 04/22/2024   ALT 22 04/22/2024   PROT 6.8 04/22/2024   ALBUMIN 4.1 04/22/2024   CALCIUM  9.9 04/22/2024   ANIONGAP 6 04/15/2017   GFR 86.37 04/22/2024   Lab Results  Component Value Date   CHOL 113 04/22/2024   Lab Results  Component Value Date   HDL 40.90 04/22/2024   Lab Results  Component Value Date   LDLCALC 53 04/22/2024   Lab Results  Component Value Date   TRIG 97.0 04/22/2024   Lab Results  Component Value Date   CHOLHDL 3 04/22/2024   Lab Results  Component Value Date   HGBA1C 9.0 (H) 04/22/2024      Assessment & Plan:  Type 2 diabetes mellitus with diabetic microalbuminuria, without long-term current use of insulin  (HCC) Assessment & Plan: Type 2 diabetes with A1c of 9%. No current diabetes medication. Normal liver and kidney function, GFR 86%. - Initiated metformin  500 mg extended-release daily with breakfast. - Continue dietary modifications, reduce simple sugars, incorporate whole grains and proteins. - Monitor glucose with Libre sensor. - Educated on metformin  side effects, advised to report persistent symptoms. - Follow up in four weeks   Other orders -     metFORMIN  HCl ER; Take 1 tablet (500 mg total) by mouth daily with breakfast.  Dispense: 30 tablet; Refill: 1    Follow-up: Return in about 4 weeks (around 06/18/2024) for Diabetes, PCP or me. .    Demetrios Byron, NP

## 2024-05-21 NOTE — Patient Instructions (Addendum)
 You came in today to discuss your diabetes management. Your recent A1c level is 9%, and you have been making dietary changes to help control your blood sugar. We have decided to start you on metformin  to help manage your diabetes.  YOUR PLAN:  TYPE 2 DIABETES MELLITUS: Your A1c level is 9%, indicating that your blood sugar has been high. You have been managing your diabetes with diet, but we need to add medication to help control it better. -Start taking metformin  500 mg extended-release daily with breakfast. -Continue with your dietary changes, reducing simple sugars and incorporating whole grains and proteins. -Monitor your blood sugar levels using the Libre sensor. -Follow up in four weeks to check your blood sugar levels and see how you are responding to metformin . -Be aware of metformin  side effects and report any persistent symptoms to us . -Your metformin  prescription has been sent to [facility] at [facility] location.

## 2024-05-28 ENCOUNTER — Ambulatory Visit: Admitting: Neurology

## 2024-05-28 NOTE — Assessment & Plan Note (Signed)
 Type 2 diabetes with A1c of 9%. No current diabetes medication. Normal liver and kidney function, GFR 86%. - Initiated metformin  500 mg extended-release daily with breakfast. - Continue dietary modifications, reduce simple sugars, incorporate whole grains and proteins. - Monitor glucose with Libre sensor. - Educated on metformin  side effects, advised to report persistent symptoms. - Follow up in four weeks

## 2024-06-01 DIAGNOSIS — E1129 Type 2 diabetes mellitus with other diabetic kidney complication: Secondary | ICD-10-CM

## 2024-06-03 ENCOUNTER — Ambulatory Visit: Payer: Self-pay | Admitting: Internal Medicine

## 2024-06-04 NOTE — Telephone Encounter (Unsigned)
 Copied from CRM #[id number]. Topic: Clinical - Order For Equipment >> Jun 04, 2024  3:30 PM China J wrote: Reason for CRM: The patient's sensor is not working and he is wondering what can we do to get him a new one.  Please call the patient's mother at  [phone].

## 2024-06-05 NOTE — Telephone Encounter (Signed)
 Called patient mother to clarify which brand of sensor the patient was using. Patient's mother states the patient does not need the sensor, as they were here recently and was given more sensors during their visit. Patient mother has some questions in regards to how to apply the sensor. I walked her through the process step by step. Verbalized understanding. Advised to give our office a call back if she has any questions or concerns.

## 2024-06-17 ENCOUNTER — Encounter: Payer: Self-pay | Admitting: Neurology

## 2024-06-17 ENCOUNTER — Ambulatory Visit (INDEPENDENT_AMBULATORY_CARE_PROVIDER_SITE_OTHER): Admitting: Neurology

## 2024-06-17 VITALS — BP 112/78 | HR 97 | Ht 69.0 in | Wt 190.0 lb

## 2024-06-17 DIAGNOSIS — R29898 Other symptoms and signs involving the musculoskeletal system: Secondary | ICD-10-CM | POA: Diagnosis not present

## 2024-06-17 DIAGNOSIS — E1142 Type 2 diabetes mellitus with diabetic polyneuropathy: Secondary | ICD-10-CM | POA: Diagnosis not present

## 2024-06-17 NOTE — Patient Instructions (Signed)
 CT lumbar spine without contrast  Nerve testing of the legs  ELECTROMYOGRAM AND NERVE CONDUCTION STUDIES (EMG/NCS) INSTRUCTIONS  How to Prepare The neurologist conducting the EMG will need to know if you have certain medical conditions. Tell the neurologist and other EMG lab personnel if you: Have a pacemaker or any other electrical medical device Take blood-thinning medications Have hemophilia, a blood-clotting disorder that causes prolonged bleeding Bathing Take a shower or bath shortly before your exam in order to remove oils from your skin. Don't apply lotions or creams before the exam.  What to Expect You'll likely be asked to change into a hospital gown for the procedure and lie down on an examination table. The following explanations can help you understand what will happen during the exam.  Electrodes. The neurologist or a technician places surface electrodes at various locations on your skin depending on where you're experiencing symptoms. Or the neurologist may insert needle electrodes at different sites depending on your symptoms.  Sensations. The electrodes will at times transmit a tiny electrical current that you may feel as a twinge or spasm. The needle electrode may cause discomfort or pain that usually ends shortly after the needle is removed. If you are concerned about discomfort or pain, you may want to talk to the neurologist about taking a short break during the exam.  Instructions. During the needle EMG, the neurologist will assess whether there is any spontaneous electrical activity when the muscle is at rest - activity that isn't present in healthy muscle tissue - and the degree of activity when you slightly contract the muscle.  He or she will give you instructions on resting and contracting a muscle at appropriate times. Depending on what muscles and nerves the neurologist is examining, he or she may ask you to change positions during the exam.  After your EMG You may  experience some temporary, minor bruising where the needle electrode was inserted into your muscle. This bruising should fade within several days. If it persists, contact your primary care doctor.

## 2024-06-17 NOTE — Progress Notes (Signed)
 [facility] Clinic Note - Initial Visit   Date: 06/17/2024   Greg [name]. MRN: [id number] DOB: 01-16-1966   Dear Dr. Marylynn:  Thank you for your kind referral of Greg [name]. for consultation of right leg weakness. Although his history is well known to you, please allow us  to reiterate it for the purpose of our medical record. The patient was accompanied to the clinic by mother who also provides collateral information.     Greg [name]. is a 58 y.o. right-handed male with diabetes mellitus, schizoaffective disorder, hyperlipidemia, bipolar disorder, and drug-induced tremor presenting for evaluation of right leg weakness.   IMPRESSION/PLAN: Assessment & Plan Right lower extremity weakness, predominately affecting hip flexion.  Neurological exam shows weakness in bilateral hip flexors and adductors, which is worse on the right (2/5).  Reflexes are brisk at the patella which may indicate lumbar pathology contributing to his symptoms.  Differential includes diabetic amyotrophy or lumbosacral pathology.  Unable to proceed with MRI due to severity of tremors and inability to lay still.   - CT lumbar spine wo contrast - NCS/EMG bilateral legs - Physical therapy recommended.  I offered home PT or out-patient PT and he would like to think about this and call back, if he would like to proceed  2.  Type 2 diabetes mellitus with diabetic neuropathy  Return to clinic in 3 months  ------------------------------------------------------------- History of present illness: Discussed the use of AI scribe software for clinical note transcription with the patient, who gave verbal consent to proceed.  History of Present Illness He has experienced right leg weakness for approximately three months, beginning in early 04/04/24. The weakness developed gradually and was first noticed during driving, as he had difficulty moving his right leg from the brake  to the gas pedal, describing it as feeling 'heavy'. This has affected his ability to drive, and he no longer drives as a result.  No low back pain, shooting pain, numbness, or tingling associated with the right leg weakness. There is no weakness in the left leg. He uses a walker for mobility and reports limited movability in the right leg.  He has a long history of diabetes, which he manages with a sensor for monitoring blood glucose levels. His last A1c was 9.0 (previously 12), but he reports that it has improved significantly, with readings staying within range 90% of the time. He has lost 20 to 30 pounds over the past few months.  He has a history of schizoaffective disorder, which has been stable for the past six years with the use of an Invega  injection. He has not had any manic episodes during this time. 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 This is managed by his psychiatrist.    He does not smoke or drink alcohol, but he uses smokeless tobacco.  He lives at home with his mother.  Father passed away in 2024/04/04.    Out-side paper records, electronic medical record, and images have been reviewed where available and summarized as:  Lab Results  Component Value Date   HGBA1C 9.0 (H) 04/22/2024   Lab Results  Component Value Date   VITAMINB12 529 04/22/2024   Lab Results  Component Value Date   TSH 4.62 04/22/2024   No results found for: ELIGIO SANDHOFF  Past Medical History:  Diagnosis Date   Acute cholecystitis with chronic cholecystitis 03/27/2016   Allergy    Bipolar  1 disorder (HCC)    Diabetes (HCC)    Dyslipidemia    HTN (hypertension)    Obesity    Paranoia (HCC)    Schizo affective schizophrenia (HCC)     Past Surgical History:  Procedure Laterality Date   APPENDECTOMY  58 years old   CHOLECYSTECTOMY N/A 03/30/2016   Procedure: LAPAROSCOPIC CHOLECYSTECTOMY;  Surgeon: Dorothyann LITTIE Husk, MD;   Location: [facility] ORS;  Service: General;  Laterality: N/A;   TONSILLECTOMY  58 year old     Medications:  Outpatient Encounter Medications as of 06/17/2024  Medication Sig   benztropine (COGENTIN) 2 MG tablet Take 2 mg by mouth at bedtime.   divalproex  (DEPAKOTE ) 250 MG DR tablet Take 3 tablets (750 mg total) by mouth every 12 (twelve) hours.   INVEGA  TRINZA 819 MG/2.63ML injection Inject 819 mg into the muscle every 3 (three) months.   lithium  carbonate 300 MG capsule Take 300 mg by mouth 3 (three) times daily.   metFORMIN  (GLUCOPHAGE -XR) 500 MG 24 hr tablet Take 1 tablet (500 mg total) by mouth daily with breakfast.   paliperidone  (INVEGA ) 1.5 MG 24 hr tablet Take 1.5 mg by mouth at bedtime. (Patient not taking: Reported on 06/17/2024)   propranolol  (INDERAL ) 20 MG tablet Take 20 mg by mouth 2 (two) times daily. (Patient not taking: Reported on 06/17/2024)   No facility-administered encounter medications on file as of 06/17/2024.    Allergies:  Allergies  Allergen Reactions   Haldol  [Haloperidol ] Anaphylaxis   Seroquel [Quetiapine Fumarate] Other (See Comments)    Reaction: morbid, depressed    Family History: Family History  Problem Relation Age of Onset   Hypertension Mother    Hyperlipidemia Mother    Cancer Mother    Depression Mother    Mental illness Mother    Stroke Father    Hypertension Father    Diabetes Father    Arthritis Father    Depression Father    Mental illness Father    Depression Sister    Mental illness Sister    Cancer Maternal Grandmother    Hyperlipidemia Maternal Grandmother    Arthritis Maternal Grandfather    Heart disease Paternal Grandmother    Arthritis Paternal Grandmother    Stroke Paternal Grandfather    Hypertension Paternal Grandfather    Cancer Paternal Grandfather    Heart attack Paternal Grandfather     Social History: Social History   Tobacco Use   Smoking status: Former    Current packs/day: 0.50    Average packs/day:  0.5 packs/day for 8.0 years (4.0 ttl pk-yrs)    Types: Cigarettes   Smokeless tobacco: Current    Types: Snuff   Tobacco comments:    Dips Tobacco   Substance Use Topics   Alcohol use: No    Alcohol/week: 0.0 standard drinks of alcohol   Drug use: No   Social History   Social History Narrative   Are you right handed or left handed? Right Handed    Are you currently employed ? No    What is your current occupation?    Do you live at home alone? No    Who lives with you? Mom     What type of home do you live in: 1 story or 2 story? Lives in a one story home         Vital Signs:  BP 112/78   Pulse 97   Ht 5' 9 (1.753 m)   Wt 190 lb (86.2 kg)  SpO2 98%   BMI 28.06 kg/m   Neurological Exam: MENTAL STATUS including orientation to time, place, person, recent and remote memory, attention span and concentration, language, and fund of knowledge is normal.  Speech is not dysarthric.  CRANIAL NERVES: II:  No visual field defects.     III-IV-VI: Pupils equal round and reactive to light.  Normal conjugate, extra-ocular eye movements in all directions of gaze.  No nystagmus.  No ptosis.   V:  Normal facial sensation.    VII:  Normal facial symmetry and movements.   VIII:  Normal hearing and vestibular function.   IX-X:  Normal palatal movement.   XI:  Normal shoulder shrug and head rotation.   XII:  Normal tongue strength and range of motion, no deviation or fasciculation.  MOTOR:  Severe coarse bilateral hand tremors.  Bilateral lower leg atrophy and right quadriceps atrophy.  No fasciculations.  No pronator drift.   Upper Extremity:  Right  Left  Deltoid  5/5   5/5   Biceps  5/5   5/5   Triceps  5/5   5/5   Wrist extensors  5/5   5/5   Wrist flexors  5/5   5/5   Finger extensors  5/5   5/5   Finger flexors  5/5   5/5   Dorsal interossei  5-/5   5-/5   Abductor pollicis  5/5   5/5   Tone (Ashworth scale)  0  0   Lower Extremity:  Right  Left  Hip flexors  2/5   4-/5    Hip extensors  5/5   5/5   Adductor 3/5  3/5  Abductor 5/5  4/5  Knee flexors  5/5   5/5   Knee extensors  5/5   5/5   Dorsiflexors  5/5   5/5   Plantarflexors  5/5   5/5   Toe extensors  5-/5   5-/5   Toe flexors  5-/5   5-/5   Tone (Ashworth scale)  0  0   MSRs:                                           Right        Left brachioradialis 2+  2+  biceps 2+  2+  triceps 2+  2+  patellar 3+  3+  ankle jerk 0  0  Hoffman no  no  plantar response down  down   SENSORY:  Vibration is reduced at the knees, absent below the ankles.  Temperature and pin prick is reduced in the lower legs and feet. Sensation is intact above the knees and in the hands.  COORDINATION/GAIT: Normal finger-to- nose-finger is mildly impaired due to tremor.   Gait was not tested, patient in wheelchair.   Thank you for allowing me to participate in patient's care.  If I can answer any additional questions, I would be pleased to do so.    Sincerely,    Novalyn Lajara K. Tobie, DO

## 2024-06-18 ENCOUNTER — Ambulatory Visit: Admitting: Internal Medicine

## 2024-06-22 ENCOUNTER — Other Ambulatory Visit: Payer: Self-pay | Admitting: Internal Medicine

## 2024-06-22 ENCOUNTER — Telehealth: Payer: Self-pay

## 2024-06-22 MED ORDER — FREESTYLE LIBRE 3 PLUS SENSOR MISC: 3 refills | Status: DC

## 2024-06-22 NOTE — Addendum Note (Signed)
 Addended by: MARYLYNN VERNEITA CROME on: 06/22/2024 12:32 PM   Modules accepted: Orders

## 2024-06-22 NOTE — Telephone Encounter (Signed)
 Copied from CRM [id number]. Topic: Clinical - Medication Question >> Jun 22, 2024 11:23 AM Thersia BROCKS wrote: Reason for CRM: Patient mom called in stated his freestyle libra 3 sensor came off and they need a new one sent over to pharmacy   I do not see this in the patients med list.

## 2024-06-22 NOTE — Telephone Encounter (Signed)
 Informed patients mom that Herlene was sent to pharmacy.

## 2024-06-30 ENCOUNTER — Observation Stay
Admission: EM | Admit: 2024-06-30 | Discharge: 2024-07-03 | Disposition: A | Discharge status: Skilled Nursing Facility | Attending: Internal Medicine | Admitting: Internal Medicine

## 2024-06-30 ENCOUNTER — Telehealth: Payer: Self-pay

## 2024-06-30 ENCOUNTER — Emergency Department

## 2024-06-30 ENCOUNTER — Other Ambulatory Visit: Payer: Self-pay

## 2024-06-30 DIAGNOSIS — F259 Schizoaffective disorder, unspecified: Secondary | ICD-10-CM | POA: Insufficient documentation

## 2024-06-30 DIAGNOSIS — F319 Bipolar disorder, unspecified: Secondary | ICD-10-CM | POA: Insufficient documentation

## 2024-06-30 DIAGNOSIS — R29898 Other symptoms and signs involving the musculoskeletal system: Secondary | ICD-10-CM | POA: Diagnosis present

## 2024-06-30 DIAGNOSIS — R251 Tremor, unspecified: Secondary | ICD-10-CM | POA: Insufficient documentation

## 2024-06-30 DIAGNOSIS — R531 Weakness: Principal | ICD-10-CM

## 2024-06-30 DIAGNOSIS — E871 Hypo-osmolality and hyponatremia: Secondary | ICD-10-CM | POA: Diagnosis not present

## 2024-06-30 DIAGNOSIS — E119 Type 2 diabetes mellitus without complications: Secondary | ICD-10-CM | POA: Insufficient documentation

## 2024-06-30 DIAGNOSIS — W19XXXA Unspecified fall, initial encounter: Secondary | ICD-10-CM | POA: Diagnosis not present

## 2024-06-30 DIAGNOSIS — F250 Schizoaffective disorder, bipolar type: Secondary | ICD-10-CM | POA: Diagnosis present

## 2024-06-30 DIAGNOSIS — R262 Difficulty in walking, not elsewhere classified: Secondary | ICD-10-CM | POA: Diagnosis present

## 2024-06-30 DIAGNOSIS — I10 Essential (primary) hypertension: Secondary | ICD-10-CM | POA: Diagnosis present

## 2024-06-30 LAB — URINALYSIS, ROUTINE W REFLEX MICROSCOPIC
Bilirubin Urine: NEGATIVE
Glucose, UA: 150 mg/dL — AB
Hgb urine dipstick: NEGATIVE
Ketones, ur: 5 mg/dL — AB
Leukocytes,Ua: NEGATIVE
Nitrite: NEGATIVE
Protein, ur: NEGATIVE mg/dL
Specific Gravity, Urine: 1.005 (ref 1.005–1.030)
pH: 6 (ref 5.0–8.0)

## 2024-06-30 LAB — CBC
HCT: 42.8 % (ref 39.0–52.0)
Hemoglobin: 14.1 g/dL (ref 13.0–17.0)
MCH: 29 pg (ref 26.0–34.0)
MCHC: 32.9 g/dL (ref 30.0–36.0)
MCV: 88.1 fL (ref 80.0–100.0)
Platelets: 246 K/uL (ref 150–400)
RBC: 4.86 MIL/uL (ref 4.22–5.81)
RDW: 12.1 % (ref 11.5–15.5)
WBC: 8.6 K/uL (ref 4.0–10.5)
nRBC: 0 % (ref 0.0–0.2)

## 2024-06-30 LAB — TSH: TSH: 3.1 u[IU]/mL (ref 0.350–4.500)

## 2024-06-30 LAB — COMPREHENSIVE METABOLIC PANEL WITH GFR
ALT: 15 U/L (ref 0–44)
AST: 17 U/L (ref 15–41)
Albumin: 4 g/dL (ref 3.5–5.0)
Alkaline Phosphatase: 70 U/L (ref 38–126)
Anion gap: 10 (ref 5–15)
BUN: 10 mg/dL (ref 6–20)
CO2: 24 mmol/L (ref 22–32)
Calcium: 9.3 mg/dL (ref 8.9–10.3)
Chloride: 95 mmol/L — ABNORMAL LOW (ref 98–111)
Creatinine, Ser: 0.62 mg/dL (ref 0.61–1.24)
GFR, Estimated: >60 mL/min (ref >60)
Glucose, Bld: 239 mg/dL — ABNORMAL HIGH (ref 70–99)
Potassium: 4 mmol/L (ref 3.5–5.1)
Sodium: 129 mmol/L — ABNORMAL LOW (ref 135–145)
Total Bilirubin: 0.4 mg/dL (ref 0.0–1.2)
Total Protein: 6.9 g/dL (ref 6.5–8.1)

## 2024-06-30 LAB — OSMOLALITY, URINE: Osmolality, Ur: 161 mosm/kg — ABNORMAL LOW (ref 300–900)

## 2024-06-30 LAB — GLUCOSE, CAPILLARY: Glucose-Capillary: 152 mg/dL — ABNORMAL HIGH (ref 70–99)

## 2024-06-30 LAB — OSMOLALITY: Osmolality: 279 mosm/kg (ref 275–295)

## 2024-06-30 MED ORDER — ACETAMINOPHEN 650 MG RE SUPP: 650.0000 mg | Freq: Four times a day (QID) | RECTAL | Status: DC | PRN

## 2024-06-30 MED ORDER — ONDANSETRON HCL 4 MG PO TABS: 4.0000 mg | ORAL_TABLET | Freq: Four times a day (QID) | ORAL | Status: DC | PRN

## 2024-06-30 MED ORDER — BENZTROPINE MESYLATE 0.5 MG PO TABS
2.0000 mg | ORAL_TABLET | Freq: Every day | ORAL | Status: DC
  Administered 2024-06-30 – 2024-07-02 (×3): 2 mg via ORAL
  Filled 2024-06-30 (×2): qty 4
  Filled 2024-06-30: qty 2

## 2024-06-30 MED ORDER — MAGNESIUM HYDROXIDE 400 MG/5ML PO SUSP: 30.0000 mL | Freq: Every day | ORAL | Status: DC | PRN

## 2024-06-30 MED ORDER — TRAZODONE HCL 50 MG PO TABS: 25.0000 mg | ORAL_TABLET | Freq: Every evening | ORAL | Status: DC | PRN

## 2024-06-30 MED ORDER — SODIUM CHLORIDE 0.9 % IV BOLUS
1000.0000 mL | Freq: Once | INTRAVENOUS | Status: AC
  Administered 2024-06-30: 1000 mL via INTRAVENOUS

## 2024-06-30 MED ORDER — ACETAMINOPHEN 325 MG PO TABS: 650.0000 mg | ORAL_TABLET | Freq: Four times a day (QID) | ORAL | Status: DC | PRN

## 2024-06-30 MED ORDER — SODIUM CHLORIDE 0.9 % IV SOLN
INTRAVENOUS | Status: DC
  Administered 2024-06-30: 125 mL/h via INTRAVENOUS

## 2024-06-30 MED ORDER — LITHIUM CARBONATE 300 MG PO CAPS
300.0000 mg | ORAL_CAPSULE | Freq: Three times a day (TID) | ORAL | Status: DC
  Administered 2024-06-30 – 2024-07-03 (×9): 300 mg via ORAL
  Filled 2024-06-30 (×11): qty 1

## 2024-06-30 MED ORDER — ENOXAPARIN SODIUM 40 MG/0.4ML IJ SOSY
40.0000 mg | PREFILLED_SYRINGE | INTRAMUSCULAR | Status: DC
  Administered 2024-06-30 – 2024-07-02 (×3): 40 mg via SUBCUTANEOUS
  Filled 2024-06-30 (×3): qty 0.4

## 2024-06-30 MED ORDER — DIVALPROEX SODIUM 500 MG PO DR TAB
750.0000 mg | DELAYED_RELEASE_TABLET | Freq: Two times a day (BID) | ORAL | Status: DC
  Administered 2024-06-30 – 2024-07-03 (×6): 750 mg via ORAL
  Filled 2024-06-30 (×7): qty 1

## 2024-06-30 MED ORDER — ONDANSETRON HCL 4 MG/2ML IJ SOLN: 4.0000 mg | Freq: Four times a day (QID) | INTRAMUSCULAR | Status: DC | PRN

## 2024-06-30 NOTE — ED Triage Notes (Signed)
 Refer to first nurse note. Significant hx of leg weakness. Pt had a mechanical fall. Hit his head. No loc.

## 2024-06-30 NOTE — H&P (Incomplete)
 Greg [name]   PATIENT NAME: Greg [name]    MR#:  [id number]  DATE OF BIRTH:  1966-03-15  DATE OF ADMISSION:  06/30/2024  PRIMARY CARE PHYSICIAN: Greg Verneita CROME, MD   Patient is coming from: Home  REQUESTING/REFERRING PHYSICIAN: Clarine Sharper, MD  CHIEF COMPLAINT:   Chief Complaint  Patient presents with   Weakness    HISTORY OF PRESENT ILLNESS:  Greg [name]. is a 58 y.o. Caucasian male with medical history significant for type 2 diabetes mellitus, hypertension, dyslipidemia, schizoaffective bipolar disorder, and obesity, presented to the emergency room with acute onset of generalized weakness and a mechanical fall this morning at around 7 AM.  He was able to catch himself with his hands but had difficulty getting up.SABRA  He has been having tremors and bilateral lower extremity weakness so over the last 3 months.  He lost about 20 pounds of weight.  He has not been compliant with his medications.  His blood glucose levels have been elevated.  No fever or chills.  No nausea or vomiting or abdominal pain.  No dysuria, oliguria or hematuria or flank pain.  No chest pain or palpitations.  No cough or wheezing or dyspnea.  No bleeding diathesis.  ED Course: When the patient came to the ER, BP was 167/94 with heart rate of 113 with otherwise normal vital signs.  Labs revealed yponatremia 129 hypochloremia of 95 with a blood glucose of 239 otherwise unremarkable CMP.  CBC showed no abnormalities. EKG as reviewed by me : EKG showed sinus tachycardia with rate 112 with slightly poor wave progression and low voltage QRS. Imaging: Noncontrast head CT scan showed no acute intracranial normalities.  C-spine CT showed no acute cervical spine fracture or traumatic subluxation or static signs of instability.  It showed mild cervical spondylosis.  Lumbar spine CT showed mild degenerative changes in the lower lumbar spine with no spinal canal or neural foraminal stenosis and no acute  fracture or subluxation of the lumbar spine.  The patient was given 1 L bolus of IV normal saline.  He will be admitted to a medical telemetry observation bed for further evaluation and management. PAST MEDICAL HISTORY:   Past Medical History:  Diagnosis Date   Acute cholecystitis with chronic cholecystitis 03/27/2016   Allergy    Bipolar 1 disorder (HCC)    Diabetes (HCC)    Dyslipidemia    HTN (hypertension)    Obesity    Paranoia (HCC)    Schizo affective schizophrenia (HCC)     PAST SURGICAL HISTORY:   Past Surgical History:  Procedure Laterality Date   APPENDECTOMY  58 years old   CHOLECYSTECTOMY N/A 03/30/2016   Procedure: LAPAROSCOPIC CHOLECYSTECTOMY;  Surgeon: Greg [name] Husk, MD;  Location: [facility] ORS;  Service: General;  Laterality: N/A;   TONSILLECTOMY  58 year old    SOCIAL HISTORY:   Social History   Tobacco Use   Smoking status: Former    Current packs/day: 0.50    Average packs/day: 0.5 packs/day for 8.0 years (4.0 ttl pk-yrs)    Types: Cigarettes   Smokeless tobacco: Current    Types: Snuff   Tobacco comments:    Dips Tobacco   Substance Use Topics   Alcohol use: No    Alcohol/week: 0.0 standard drinks of alcohol    FAMILY HISTORY:   Family History  Problem Relation Age of Onset   Hypertension Mother    Hyperlipidemia Mother    Cancer Mother  Depression Mother    Mental illness Mother    Stroke Father    Hypertension Father    Diabetes Father    Arthritis Father    Depression Father    Mental illness Father    Depression Sister    Mental illness Sister    Cancer Maternal Grandmother    Hyperlipidemia Maternal Grandmother    Arthritis Maternal Grandfather    Heart disease Paternal Grandmother    Arthritis Paternal Grandmother    Stroke Paternal Grandfather    Hypertension Paternal Grandfather    Cancer Paternal Grandfather    Heart attack Paternal Grandfather     DRUG ALLERGIES:   Allergies  Allergen Reactions   Haldol   [Haloperidol ] Anaphylaxis   Seroquel [Quetiapine Fumarate] Other (See Comments)    Reaction: morbid, depressed    REVIEW OF SYSTEMS:   ROS As per history of present illness. All pertinent systems were reviewed above. Constitutional, HEENT, cardiovascular, respiratory, GI, GU, musculoskeletal, neuro, psychiatric, endocrine, integumentary and hematologic systems were reviewed and are otherwise negative/unremarkable except for positive findings mentioned above in the HPI.   MEDICATIONS AT HOME:   Prior to Admission medications   Medication Sig Start Date End Date Taking? Authorizing Provider  benztropine (COGENTIN) 2 MG tablet Take 2 mg by mouth at bedtime. 04/08/24   [provider]  Continuous Glucose Sensor (FREESTYLE LIBRE 3 PLUS SENSOR) MISC Change sensor every 15 days. 06/22/24   Greg Verneita CROME, MD  divalproex  (DEPAKOTE ) 250 MG DR tablet Take 3 tablets (750 mg total) by mouth every 12 (twelve) hours. 05/06/17   Greg [name], Greg B, MD  INVEGA  TRINZA 819 MG/2.63ML injection Inject 819 mg into the muscle every 3 (three) months. 03/26/24   [provider]  lithium  carbonate 300 MG capsule Take 300 mg by mouth 3 (three) times daily. 04/08/24   [provider]  metFORMIN  (GLUCOPHAGE -XR) 500 MG 24 hr tablet Take 1 tablet (500 mg total) by mouth daily with breakfast. 05/21/24   Greg Saber, NP  paliperidone  (INVEGA ) 1.5 MG 24 hr tablet Take 1.5 mg by mouth at bedtime. Patient not taking: Reported on 06/17/2024 01/21/24   [provider]  propranolol  (INDERAL ) 20 MG tablet Take 20 mg by mouth 2 (two) times daily. Patient not taking: Reported on 06/17/2024 04/08/24   [provider]      VITAL SIGNS:  Blood pressure (!) 161/86, pulse (!) 105, temperature 97.9 F (36.6 C), temperature source Oral, resp. rate 20, height 5' 9 (1.753 m), weight 86.2 kg, SpO2 97%.  PHYSICAL EXAMINATION:  Physical Exam  GENERAL:  58 y.o.-year-old Caucasian male  patient lying in the bed with no acute distress.  EYES: Pupils equal, round, reactive to light and accommodation. No scleral icterus. Extraocular muscles intact.  HEENT: Head atraumatic, normocephalic. Oropharynx and nasopharynx clear.  NECK:  Supple, no jugular venous distention. No thyroid  enlargement, no tenderness.  LUNGS: Normal breath sounds bilaterally, no wheezing, rales,rhonchi or crepitation. No use of accessory muscles of respiration.  CARDIOVASCULAR: Regular rate and rhythm, S1, S2 normal. No murmurs, rubs, or gallops.  ABDOMEN: Soft, nondistended, nontender. Bowel sounds present. No organomegaly or mass.  EXTREMITIES: No pedal edema, cyanosis, or clubbing.  NEUROLOGIC: Cranial nerves II through XII are intact. Muscle strength 5/5 in all extremities. Sensation intact. Gait not checked.  PSYCHIATRIC: The patient is alert and oriented x 3.  Normal affect and good eye contact. SKIN: No obvious rash, lesion, or ulcer.   LABORATORY PANEL:   CBC Recent  Labs  Lab 06/30/24 1351  WBC 8.6  HGB 14.1  HCT 42.8  PLT 246   ------------------------------------------------------------------------------------------------------------------  Chemistries  Recent Labs  Lab 06/30/24 1351  NA 129*  K 4.0  CL 95*  CO2 24  GLUCOSE 239*  BUN 10  CREATININE 0.62  CALCIUM  9.3  AST 17  ALT 15  ALKPHOS 70  BILITOT 0.4   ------------------------------------------------------------------------------------------------------------------  Cardiac Enzymes No results for input(s): TROPONINI in the last 168 hours. ------------------------------------------------------------------------------------------------------------------  RADIOLOGY:  CT Lumbar Spine Wo Contrast Result Date: 06/30/2024 CLINICAL DATA:  Acute on chronic bilateral leg weakness, fall. EXAM: CT LUMBAR SPINE WITHOUT CONTRAST TECHNIQUE: Multidetector CT imaging of the lumbar spine was performed without intravenous contrast  administration. Multiplanar CT image reconstructions were also generated. RADIATION DOSE REDUCTION: This exam was performed according to the departmental dose-optimization program which includes automated exposure control, adjustment of the mA and/or kV according to patient size and/or use of iterative reconstruction technique. COMPARISON:  None Available. FINDINGS: Segmentation: 5 lumbar type vertebrae. Alignment: Normal. Vertebrae: No acute fracture. Vertebral body heights are normal. The posterior elements are intact. No evidence of focal bone lesion or bony destructive change. Paraspinal and other soft tissues: No paraspinal hematoma. Aortic atherosclerosis. Disc levels: T11-T12: No spinal canal or neural foraminal stenosis T12-L1: No spinal canal or neural foraminal stenosis L1-L2: No spinal canal or neural foraminal stenosis L2-L3: No spinal canal or neural foraminal stenosis L3-L4: No spinal canal or neural foraminal stenosis L4-L5: Mild facet and ligamentum flavum hypertrophy. No spinal canal or neural foraminal stenosis L5-S1: Broad-based disc bulge. No spinal canal or neural foraminal stenosis IMPRESSION: 1. No acute fracture or subluxation of the lumbar spine. 2. Mild degenerative change in the lower lumbar spine. No spinal canal or neural foraminal stenosis. Aortic Atherosclerosis (ICD10-I70.0). Electronically Signed   By: Andrea Gasman M.D.   On: 06/30/2024 17:16   CT HEAD WO CONTRAST ( ) Result Date: 06/30/2024 CLINICAL DATA:  Trauma.  Worsening bilateral leg weakness. EXAM: CT HEAD WITHOUT CONTRAST TECHNIQUE: Contiguous axial images were obtained from the base of the skull through the vertex without intravenous contrast. RADIATION DOSE REDUCTION: This exam was performed according to the departmental dose-optimization program which includes automated exposure control, adjustment of the mA and/or kV according to patient size and/or use of iterative reconstruction technique. COMPARISON:  Remote  head CT 08/05/2012 FINDINGS: Brain: Mild motion artifact limitations. No intracranial hemorrhage, mass effect, or midline shift. No hydrocephalus. The basilar cisterns are patent. No evidence of territorial infarct or acute ischemia. No extra-axial or intracranial fluid collection. Vascular: No hyperdense vessel or unexpected calcification. Skull: No fracture or focal lesion. Sinuses/Orbits: No acute findings. Ethmoid air cell mucosal thickening. No mastoid effusion. Other: No confluent scalp hematoma. IMPRESSION: No acute intracranial abnormality. No skull fracture. Electronically Signed   By: Andrea Gasman M.D.   On: 06/30/2024 17:10   CT Cervical Spine Wo Contrast Result Date: 06/30/2024 CLINICAL DATA:  Fall.  Worsening leg weakness. EXAM: CT CERVICAL SPINE WITHOUT CONTRAST TECHNIQUE: Multidetector CT imaging of the cervical spine was performed without intravenous contrast. Multiplanar CT image reconstructions were also generated. RADIATION DOSE REDUCTION: This exam was performed according to the departmental dose-optimization program which includes automated exposure control, adjustment of the mA and/or kV according to patient size and/or use of iterative reconstruction technique. COMPARISON:  None Available. FINDINGS: Alignment: Normal. Skull base and vertebrae: No evidence of acute cervical spine fracture or traumatic subluxation. Soft tissues and spinal canal: No prevertebral fluid or  swelling. No visible canal hematoma. Disc levels: The disc heights are maintained. There is mild multilevel facet hypertrophy, greatest on the left at C3-4. No evidence of large disc herniation, significant spinal stenosis or significant foraminal narrowing. Upper chest: Clear lung apices. Other: Bilateral carotid atherosclerosis. IMPRESSION: 1. No evidence of acute cervical spine fracture, traumatic subluxation or static signs of instability. 2. Mild cervical spondylosis. Electronically Signed   By: Elsie Perone M.D.    On: 06/30/2024 17:03      IMPRESSION AND PLAN:  Assessment and Plan: * Unable to ambulate - This associated with generalized weakness that could be related to hyponatremia and recent anorexia. - The patient will be admitted to an observation medical telemetry bed. - PT consult will be obtained to assess his ambulation. - Case management consult to be obtained to assess for placement.  Hyponatremia - This is likely hypovolemic hyponatremia. - Will obtain hyponatremia workup - The patient will be hydrated with IV normal saline and will follow BMP.  Schizoaffective disorder, bipolar type (HCC) - Will continue lithium  and Depakote . - The patient is on Invega  injections as well on an outpatient basis. - Will continue his Cogentin .  Type 2 diabetes mellitus without complications (HCC) - The patient will be placed on supplemental coverage with NovoLog . - Would hold off metformin .  Essential hypertension - Will continue propranolol .   DVT prophylaxis: Lovenox .  Advanced Care Planning:  Code Status: full code.  Family Communication:  The plan of care was discussed in details with the patient (and family). I answered all questions. The patient agreed to proceed with the above mentioned plan. Further management will depend upon hospital course. Disposition Plan: Back to previous home environment Consults called: none.  All the records are reviewed and case discussed with ED provider.  Status is: Observation  I certify that at the time of admission, it is my clinical judgment that the patient will require hospital care extending less than 2 midnights.                            Dispo: The patient is from: Home              Anticipated d/c is to: Home              Patient currently is not medically stable to d/c.              Difficult to place patient: No  Madison DELENA Peaches M.D on 06/30/2024 at 10:29 PM  [facility]   From 7 PM-7 AM, contact  night-coverage [email or web address]  CC: Primary care physician; Greg Verneita CROME, MD

## 2024-06-30 NOTE — Assessment & Plan Note (Signed)
-   This associated with generalized weakness that could be related to hyponatremia and recent anorexia. - The patient will be admitted to an observation medical telemetry bed. - PT consult will be obtained to assess his ambulation. - Case management consult to be obtained to assess for placement.

## 2024-06-30 NOTE — Assessment & Plan Note (Signed)
-   The patient will be placed on supplemental coverage with NovoLog . - Would hold off metformin .

## 2024-06-30 NOTE — Assessment & Plan Note (Signed)
-   The patient will be placed on supplement coverage with NovoLog.

## 2024-06-30 NOTE — Assessment & Plan Note (Addendum)
-   Will continue lithium  and Depakote . - The patient is on Invega  injections as well on an outpatient basis. - Will continue his Cogentin.

## 2024-06-30 NOTE — Assessment & Plan Note (Addendum)
-   This is likely hypovolemic hyponatremia. - Will obtain hyponatremia workup - The patient will be hydrated with IV normal saline and will follow BMP.

## 2024-06-30 NOTE — ED Triage Notes (Signed)
 Pt arrived with [facility] to ED Lobby after mechanical fall this morning between 6am and 7am. Did catch himself with his hands but had difficulty getting up; tremors and leg weakness since about 3 months ago no needing a walking and weight loss of 20 lbs. 2 years not complaint with medications and started taking them again 3 months ago. MD was not aware pt was not taking his meds previously; uncontrolled diabetic CBG 253. No hx of HTN.

## 2024-06-30 NOTE — Telephone Encounter (Signed)
 Copied from CRM #[id number]. Topic: Clinical - Prescription Issue >> Jun 30, 2024  8:30 AM Aleatha BROCKS wrote: Reason for CRM:  Patient Mom called to Check on status of Pending Continuous Glucose Sensor and also would like a call back to discuss some concerning issues with Patient he fell recently and is not walking  [place] [phone]

## 2024-06-30 NOTE — ED Provider Notes (Signed)
 [facility] Provider Note    Event Date/Time   First MD Initiated Contact with Patient 06/30/24 1503     (approximate)   History   Weakness  Pt arrived with [facility] to ED Lobby after mechanical fall this morning between 6am and 7am. Did catch himself with his hands but had difficulty getting up; tremors and leg weakness since about 3 months ago no needing a walking and weight loss of 20 lbs. 2 years not complaint with medications and started taking them again 3 months ago. MD was not aware pt was not taking his meds previously; uncontrolled diabetic CBG 253. No hx of HTN.  Refer to first nurse note. Significant hx of leg weakness. Pt had a mechanical fall. Hit his head. No loc.    HPI Greg [name]. is a 58 y.o. male  PMH bipolar 1 disorder, diabetes, dyslipidemia, leg weakness, hypertension, schizoaffective schizophrenia presents for evaluation of after fall - Patient has been having bilateral lower extremity weakness, right greater than left for several months now.  Was seen by neurologist for this recently with plan for ongoing outpatient workup, see summary below.  They have scheduled requested tests but not yet performed them. - Primarily presents to emergency department today for fall around 6 AM.  Was walking to the bathroom, apparently fell when attempting to pass the threshold into the bathroom.  Says he landed on his knees and then fell back and hit his head.  Since then has felt too weak to get up and walk around. - No recent infectious symptoms.  No preceding dizziness. - Just feels weakness that is worse than his baseline in his bilateral legs, right greater than left.  Denies any pain. - Not on thinners. - No urinary or fecal incontinence or retention, no saddle anesthesia    Chart review, last seen by neurology on 06/17/2024 for diabetic neuropathy.  Noted to have right lower extremity weakness at that time and weakness in bilateral hip  flexors and abductors.  Plan for outpatient NCS/EMG of bilateral legs and CT lumbar imaging which has not yet been performed.     Physical Exam   Triage Vital Signs: ED Triage Vitals  Encounter Vitals Group     BP 06/30/24 1339 (!) 167/94     Girls Systolic BP Percentile --      Girls Diastolic BP Percentile --      Boys Systolic BP Percentile --      Boys Diastolic BP Percentile --      Pulse Rate 06/30/24 1339 (!) 113     Resp 06/30/24 1339 17     Temp 06/30/24 1339 98.4 F (36.9 C)     Temp Source 06/30/24 1339 Oral     SpO2 06/30/24 1339 100 %     Weight 06/30/24 1340 190 lb (86.2 kg)     Height 06/30/24 1340 5' 9 (1.753 m)     Head Circumference --      Peak Flow --      Pain Score 06/30/24 1351 0     Pain Loc --      Pain Education --      Exclude from Growth Chart --     Most recent vital signs: Vitals:   06/30/24 1708 06/30/24 1950  BP: (!) 186/113 (!) 161/86  Pulse: (!) 101 (!) 105  Resp: 18 20  Temp: 97.9 F (36.6 C) 97.9 F (36.6 C)  SpO2: 98% 97%  General: Awake, no distress.  HEENT: Normocephalic, atraumatic, no midline neck pain CV:  Good peripheral perfusion. RRR, RP 2+ Resp:  Normal effort. CTAB Abd:  No distention. Nontender to deep palpation throughout Neuro:  Aox4, CN II-XII intact, normal strength bilateral upper extremities, bilateral lower extremities unable to lift antigravity which patient says is at baseline.  Station intact to light touch throughout.   ED Results / Procedures / Treatments   Labs (all labs ordered are listed, but only abnormal results are displayed) Labs Reviewed  COMPREHENSIVE METABOLIC PANEL WITH GFR - Abnormal; Notable for the following components:      Result Value   Sodium 129 (*)    Chloride 95 (*)    Glucose, Bld 239 (*)    All other components within normal limits  URINALYSIS, ROUTINE W REFLEX MICROSCOPIC - Abnormal; Notable for the following components:   Color, Urine STRAW (*)    APPearance CLEAR  (*)    Glucose, UA 150 (*)    Ketones, ur 5 (*)    All other components within normal limits  OSMOLALITY, URINE - Abnormal; Notable for the following components:   Osmolality, Ur 161 (*)    All other components within normal limits  CBC  OSMOLALITY  TSH  HIV ANTIBODY (ROUTINE TESTING W REFLEX)  BASIC METABOLIC PANEL WITH GFR  CBC  NA AND K (SODIUM & POTASSIUM), RAND UR     EKG  See ED course below   RADIOLOGY Radiology interpreted by myself and radiology reports reviewed.  No acute pathology identified.    PROCEDURES:  Critical Care performed: No  Procedures   MEDICATIONS ORDERED IN ED: Medications  lithium  carbonate capsule 300 mg (300 mg Oral Given 06/30/24 2202)  benztropine (COGENTIN) tablet 2 mg (2 mg Oral Given 06/30/24 2202)  divalproex  (DEPAKOTE ) DR tablet 750 mg (750 mg Oral Given 06/30/24 2203)  enoxaparin (LOVENOX) injection 40 mg (40 mg Subcutaneous Given 06/30/24 2204)  0.9 %  sodium chloride  infusion (125 mL/hr Intravenous New Bag/Given 06/30/24 2204)  acetaminophen  (TYLENOL ) tablet 650 mg (has no administration in time range)    Or  acetaminophen  (TYLENOL ) suppository 650 mg (has no administration in time range)  traZODone  (DESYREL ) tablet 25 mg (has no administration in time range)  magnesium  hydroxide (MILK OF MAGNESIA) suspension 30 mL (has no administration in time range)  ondansetron  (ZOFRAN ) tablet 4 mg (has no administration in time range)    Or  ondansetron  (ZOFRAN ) injection 4 mg (has no administration in time range)  sodium chloride  0.9 % bolus 1,000 mL (0 mLs Intravenous Stopped 06/30/24 1905)     IMPRESSION / MDM / ASSESSMENT AND PLAN / ED COURSE  I reviewed the triage vital signs and the nursing notes.                              DDX/MDM/AP: Differential diagnosis includes, but is not limited to, exacerbation of chronic weakness-consider underlying electrolyte abnormality, anemia, UTI.  No history to suggest underlying pneumonia or  viral syndrome.  Consider dehydration given mild tachycardia here and does appear somewhat dry on exam.  Consider possibility of intracranial hemorrhage, C-spine injury.  Will expedite currently planned outpatient L-spine CT as well to evaluate for evidence of nerve compression contributing to now chronic weakness.  Do not clinically suspect an acute spinal cord syndrome at this time.  Plan: - Labs - IV fluid - EKG - CT head, CT C-spine, CT L-spine  Patient's presentation is most consistent with acute presentation with potential threat to life or bodily function.  The patient is on the cardiac monitor to evaluate for evidence of arrhythmia and/or significant heart rate changes.  ED course below.  Workup unremarkable beyond moderate hyponatremia to 129.  Given IV fluid.  Still remained too weak to walk or stand with without heavy assistance which is apparently notably off his baseline.  Has been seen in the outpatient neurologic setting with ongoing workup and possible EMG studies pending.  CT L-spine with no apparent nerve compression.  Admitted to hospitalist service for further evaluation and management, may require placement.  Clinical Course as of 06/30/24 2320  Tue Jun 30, 2024  1537 CMP with mild hyponatremia and mild hyperglycemia  CBC reviewed, unremarkable [MM]  1537 Ecg = sinus tachycardia, rate 112, no gross ST elevation or depression, no significant repolarization abnormality, normal axis, normal intervals.  No evidence of ischemia no arrhythmia on my interpretation. [MM]  1710 CT Cspine: IMPRESSION: 1. No evidence of acute cervical spine fracture, traumatic subluxation or static signs of instability. 2. Mild cervical spondylosis.   [MM]  1723 UA w/ no e/o infxn [MM]  1723 CTH: IMPRESSION: No acute intracranial abnormality. No skull fracture.   [MM]  1723 CT L spine: IMPRESSION: 1. No acute fracture or subluxation of the lumbar spine. 2. Mild degenerative change in the  lower lumbar spine. No spinal canal or neural foraminal stenosis.   [MM]  1909 RN attempted to ambulate nurse but he said he was too weak to try  Will try again shortly [MM]  2026 Attempted to ambulate patient again with two-person assist, unable to stand up on his own even with this assistance  Given hyponatremia here as well ongoing outpatient neuro recommendations for EMG and further workup, may benefit from admission for further eval and possible placement  Hospitalist consult order placed [MM]    Clinical Course User Index [MM] Clarine Ozell LABOR, MD     FINAL CLINICAL IMPRESSION(S) / ED DIAGNOSES   Final diagnoses:  Weakness  Fall, initial encounter     Rx / DC Orders   ED Discharge Orders     None        Note:  This document was prepared using Dragon voice recognition software and may include unintentional dictation errors.   Clarine Ozell LABOR, MD 06/30/24 [phone]

## 2024-06-30 NOTE — Assessment & Plan Note (Signed)
 Will continue propranolol.

## 2024-07-01 ENCOUNTER — Observation Stay

## 2024-07-01 DIAGNOSIS — R262 Difficulty in walking, not elsewhere classified: Secondary | ICD-10-CM | POA: Diagnosis not present

## 2024-07-01 DIAGNOSIS — E871 Hypo-osmolality and hyponatremia: Secondary | ICD-10-CM | POA: Diagnosis not present

## 2024-07-01 LAB — CBC
HCT: 38.8 % — ABNORMAL LOW (ref 39.0–52.0)
Hemoglobin: 13 g/dL (ref 13.0–17.0)
MCH: 29.5 pg (ref 26.0–34.0)
MCHC: 33.5 g/dL (ref 30.0–36.0)
MCV: 88.2 fL (ref 80.0–100.0)
Platelets: 235 K/uL (ref 150–400)
RBC: 4.4 MIL/uL (ref 4.22–5.81)
RDW: 12.2 % (ref 11.5–15.5)
WBC: 7.4 K/uL (ref 4.0–10.5)
nRBC: 0 % (ref 0.0–0.2)

## 2024-07-01 LAB — BASIC METABOLIC PANEL WITH GFR
Anion gap: 9 (ref 5–15)
BUN: 8 mg/dL (ref 6–20)
CO2: 26 mmol/L (ref 22–32)
Calcium: 8.8 mg/dL — ABNORMAL LOW (ref 8.9–10.3)
Chloride: 102 mmol/L (ref 98–111)
Creatinine, Ser: 0.57 mg/dL — ABNORMAL LOW (ref 0.61–1.24)
GFR, Estimated: >60 mL/min (ref >60)
Glucose, Bld: 144 mg/dL — ABNORMAL HIGH (ref 70–99)
Potassium: 4 mmol/L (ref 3.5–5.1)
Sodium: 136 mmol/L (ref 135–145)

## 2024-07-01 LAB — NA AND K (SODIUM & POTASSIUM), RAND UR
Potassium Urine: 13 mmol/L
Sodium, Ur: <30 mmol/L

## 2024-07-01 LAB — VITAMIN B12: Vitamin B-12: 450 pg/mL (ref 180–914)

## 2024-07-01 LAB — HIV ANTIBODY (ROUTINE TESTING W REFLEX): HIV Screen 4th Generation wRfx: NONREACTIVE

## 2024-07-01 MED ORDER — DOCUSATE SODIUM 100 MG PO CAPS
100.0000 mg | ORAL_CAPSULE | Freq: Two times a day (BID) | ORAL | Status: DC
  Administered 2024-07-01 – 2024-07-03 (×5): 100 mg via ORAL
  Filled 2024-07-01 (×5): qty 1

## 2024-07-01 MED ORDER — LORAZEPAM 2 MG/ML IJ SOLN
1.0000 mg | Freq: Once | INTRAMUSCULAR | Status: AC | PRN
  Administered 2024-07-01: 1 mg via INTRAVENOUS
  Filled 2024-07-01: qty 1

## 2024-07-01 NOTE — TOC Initial Note (Signed)
 Transition of Care (TOC) - Initial/Assessment Note    Patient Details  Name: Greg [name]. MRN: [id number] Date of Birth: 06-01-1966  Transition of Care [facility]) CM/SW Contact:    Victory Jackquline GORMAN, RN Phone Number: 07/01/2024, 11:51 AM  Clinical Narrative:  RNCM received a message from the bedside nurse via secure chat saying that the patient's mom wanted to speak to me. RNCM met with the patient and his  mother at  the bedside, Introduced myself and my role and that we would be discussing discharge planning. Patient's mom wanted to know if the patient would be going to [facility]. She states that the patient fell yesterday and she feels like he will need PT prior to going back home. He lives with her and she is 58 years old and can't handle like he is now. Prior to his fall she was transporting him to his appointments.  She states that he will need transportation at the time of discharge. He has DME at home [employer])  RNCM informed her that PT is recommending Acute IP Rehab and she is in agreement with that. RNCM will continue to follow for discharge planning.             Expected Discharge Plan: IP Rehab Facility Barriers to Discharge: Continued Medical Work up   Patient Goals and CMS Choice            Expected Discharge Plan and Services       Living arrangements for the past 2 months: Single Family Home                                      Prior Living Arrangements/Services Living arrangements for the past 2 months: Single Family Home Lives with:: Self, Parents Patient language and need for interpreter reviewed:: Yes Do you feel safe going back to the place where you live?: Yes      Need for Family Participation in Patient Care: Yes (Comment) Care giver support system in place?: Yes (comment) Current home services: DME Criminal Activity/Legal Involvement Pertinent to Current Situation/Hospitalization: No - Comment as needed  Activities of Daily  Living   ADL Screening (condition at time of admission) Independently performs ADLs?: Yes (appropriate for developmental age) Is the patient deaf or have difficulty hearing?: No Does the patient have difficulty seeing, even when wearing glasses/contacts?: No Does the patient have difficulty concentrating, remembering, or making decisions?: No  Permission Sought/Granted                  Emotional Assessment Appearance:: Appears stated age, Well-Groomed Attitude/Demeanor/Rapport: Engaged, Gracious, Self-Confident Affect (typically observed): Calm, Pleasant, Quiet Orientation: : Oriented to Self, Oriented to Place, Oriented to  Time, Oriented to Situation Alcohol / Substance Use: Not Applicable Psych Involvement: No (comment)  Admission diagnosis:  Weakness [R53.1] Fall, initial encounter [W19.XXXA] Unable to ambulate [R26.2] Patient Active Problem List   Diagnosis Date Noted   Unable to ambulate 06/30/2024   Type 2 diabetes mellitus without complications (HCC) 06/30/2024   Weakness 06/30/2024   Fall 06/30/2024   Tachycardia 04/23/2024   Hypotension due to hypovolemia 04/23/2024   Right leg weakness 04/23/2024   Schizoaffective disorder, bipolar type (HCC) 03/28/2016   Anxiety    Tremor due to multiple drugs 10/12/2015   Bipolar 2 disorder (HCC)    Hyponatremia 10/06/2015   Essential hypertension 11/28/2014   Diabetes (HCC)  11/28/2014   Tobacco use disorder 11/28/2014   PCP:  Marylynn Verneita CROME, MD Pharmacy:   [facility] [phone] [address], [place] - [address] [place] [address] Phone: [phone] Fax: [phone]  [id number] - [place], [place] - [address] AT [facility] [address] [facility] [address] [place] [address] Phone: [phone] Fax: [phone]     Social Drivers of Health (SDOH) Social History: SDOH Screenings   Food Insecurity: No Food Insecurity (06/30/2024)  Housing: Low Risk   (06/30/2024)  Transportation Needs: No Transportation Needs (06/30/2024)  Utilities: Not At Risk (06/30/2024)  Alcohol Screen: Low Risk  (06/29/2017)  Depression (PHQ2-9): Medium Risk (04/22/2024)  Tobacco Use: High Risk (06/30/2024)   SDOH Interventions:     Readmission Risk Interventions     No data to display

## 2024-07-01 NOTE — Telephone Encounter (Signed)
 LMTCB

## 2024-07-01 NOTE — Evaluation (Signed)
 Occupational Therapy Evaluation Patient Details Name: Greg [name]. MRN: [id number] DOB: 05-12-1966 Today's Date: 07/01/2024   History of Present Illness   Pt is a 58 y/o M admitted on 06/30/24 after presenting with c/o acute onset of generalized weakness & a fall. Pt is being treated for inability to ambulate that is possibly related to hyponatremia & recent anorexia. PMH: DM2, HTN, dyslipidemia, schizoaffective bipolar disorder, obesity   Clinical Impressions Mr Bledsoe was seen for OT evaluation this date. Prior to hospital admission, pt was MOD I for mobility, recently requiring use of RW. Pt lives with mother. Pt presents to acute OT demonstrating impaired ADL performance and functional mobility 2/2 decreased activity tolerance and functional strength/balance deficits. Pt currently requires MOD A + RW sit<>stand from bed and side steps along EOB - requires L knee block to stand from bed height. Noted LLE buckling in standing. SETUP + SUP don B shoes in sitting, MAX A clothing mgmt standing. Pt would benefit from skilled OT to address noted impairments and functional limitations (see below for any additional details). Upon hospital discharge, recommend OT follow up.    If plan is discharge home, recommend the following:   A lot of help with walking and/or transfers;A lot of help with bathing/dressing/bathroom     Functional Status Assessment   Patient has had a recent decline in their functional status and demonstrates the ability to make significant improvements in function in a reasonable and predictable amount of time.     Equipment Recommendations   BSC/3in1     Recommendations for Other Services         Precautions/Restrictions   Precautions Precautions: Fall Precaution/Restrictions Comments: baseline tremors Restrictions Weight Bearing Restrictions Per Provider Order: No     Mobility Bed Mobility Overal bed mobility: Needs Assistance Bed Mobility:  Supine to Sit, Sit to Supine     Supine to sit: Supervision Sit to supine: Supervision        Transfers Overall transfer level: Needs assistance Equipment used: Rolling walker (2 wheels) Transfers: Sit to/from Stand Sit to Stand: Mod assist           General transfer comment: requires knee block      Balance Overall balance assessment: Needs assistance Sitting-balance support: Feet supported Sitting balance-Leahy Scale: Good     Standing balance support: Bilateral upper extremity supported Standing balance-Leahy Scale: Poor                             ADL either performed or assessed with clinical judgement   ADL Overall ADL's : Needs assistance/impaired                                       General ADL Comments: MOD A + RW simulated BSC t/f. SETUP + SUP don B shoes in sitting, MAX A clothing mgmt standing      Pertinent Vitals/Pain Pain Assessment Pain Assessment: No/denies pain     Extremity/Trunk Assessment Upper Extremity Assessment Upper Extremity Assessment: Generalized weakness (BUE resting tremors)   Lower Extremity Assessment Lower Extremity Assessment: Generalized weakness       Communication Communication Communication: No apparent difficulties   Cognition Arousal: Alert Behavior During Therapy: WFL for tasks assessed/performed Cognition: No apparent impairments  Following commands: Impaired Following commands impaired: Follows multi-step commands with increased time     Cueing  General Comments   Cueing Techniques: Verbal cues      Exercises     Shoulder Instructions      Home Living Family/patient expects to be discharged to:: Private residence Living Arrangements: Parent Available Help at Discharge: Family Type of Home: House Home Access: Ramped entrance     Home Layout: Two level Alternate Level Stairs-Number of Steps: 1 step down to his living  space   Bathroom Shower/Tub: Walk-in shower         Home Equipment: Agricultural Consultant (2 wheels);Wheelchair - manual          Prior Functioning/Environment Prior Level of Function : Independent/Modified Independent             Mobility Comments: Pt & mother report a decline over the past ~3 months, pt previously ambulatory without AD, driving but stopped driving 2/2 decreased ability to manage pedals with RLE, started using walker, at least 3 falls in the past 6 months. ADLs Comments: recently transitioned to sponge bathes 2/2 progressive weakness.    OT Problem List: Decreased strength;Decreased activity tolerance;Decreased range of motion;Impaired balance (sitting and/or standing);Decreased safety awareness   OT Treatment/Interventions: Self-care/ADL training;Therapeutic exercise;Energy conservation;DME and/or AE instruction;Therapeutic activities;Patient/family education;Balance training      OT Goals(Current goals can be found in the care plan section)   Acute Rehab OT Goals Patient Stated Goal: to not fall OT Goal Formulation: With patient Time For Goal Achievement: 07/15/24 Potential to Achieve Goals: Good ADL Goals Pt Will Perform Grooming: with modified independence;standing Pt Will Perform Lower Body Dressing: with modified independence;sit to/from stand Pt Will Transfer to Toilet: with modified independence;ambulating;regular height toilet   OT Frequency:  Min 3X/week    Co-evaluation              AM-PAC OT 6 Clicks Daily Activity     Outcome Measure Help from another person eating meals?: None Help from another person taking care of personal grooming?: A Little Help from another person toileting, which includes using toliet, bedpan, or urinal?: A Lot Help from another person bathing (including washing, rinsing, drying)?: A Lot Help from another person to put on and taking off regular upper body clothing?: A Little Help from another person to put on  and taking off regular lower body clothing?: A Lot 6 Click Score: 16   End of Session Equipment Utilized During Treatment: Rolling walker (2 wheels);Gait belt  Activity Tolerance: Patient tolerated treatment well Patient left: in bed;with call bell/phone within reach;with bed alarm set  OT Visit Diagnosis: Other abnormalities of gait and mobility (R26.89);Muscle weakness (generalized) (M62.81)                Time: 8578-8557 OT Time Calculation (min): 21 min Charges:  OT General Charges $OT Visit: 1 Visit OT Evaluation $OT Eval Low Complexity: 1 Low OT Treatments $Self Care/Home Management : 8-22 mins  Elston Slot, M.S. OTR/L  07/01/24, 3:12 PM  ascom [phone]

## 2024-07-01 NOTE — Progress Notes (Addendum)
   Inpatient Rehab Admissions Coordinator :  Per therapy recommendations patient was screened for CIR candidacy by Heron Leavell RN MSN. Medical work up ongoing and he is under Observation Status. I await further diagnostics to determine if patient will demonstrate the medical neccesity for a Hospital Rehabilitation /CIR admit. I will not place a Rehab Consult yet for need a diagnosis to treat at CIR level rehab. Noted OP neuro workup. I will follow up after workup. Please contact me with any questions.  Heron Leavell RN MSN Admissions Coordinator [phone]

## 2024-07-01 NOTE — Care Management Obs Status (Signed)
 MEDICARE OBSERVATION STATUS NOTIFICATION   Patient Details  Name: Greg [name]. MRN: [id number] Date of Birth: Dec 13, 1965   Medicare Observation Status Notification Given:  Chaney BRANDY CHRISTIANE LELON, CMA 07/01/2024, 12:00 PM

## 2024-07-01 NOTE — Evaluation (Addendum)
 Physical Therapy Evaluation Patient Details Name: Greg [name]. MRN: [id number] DOB: [birth year] Today's Date: 07/01/2024  History of Present Illness  Pt is a 58 y/o M admitted on 06/30/24 after presenting with c/o acute onset of generalized weakness & a fall. Pt is being treated for inability to ambulate that is possibly related to hyponatremia & recent anorexia. PMH: DM2, HTN, dyslipidemia, schizoaffective bipolar disorder, obesity  Clinical Impression  Pt seen for PT evaluation with pt agreeable, mother present during session. Pt reports prior to recent decline he was independent without AD, driving, but over the past ~3 months has had progressive decline, using RW for mobility. On this date, pt presents with BLE>BUE weakness. Pt is able to perform lateral scoot to drop arm recliner with supervision, sit>stand from recliner with cuing re: hand placement with min assist. Pt progresses to marching in place, fearful of falling but no overt BLE knee buckling observed. Encouraged pt to perform stand pivot transfers with +2 assist from nursing staff back to bed later today. Recommend ongoing PT services to progress mobility, reduce fall risk & decrease caregiver burden. Recommend post acute rehab >3 hours therapy/day upon d/c to maximize independence before return home with very supportive mother.      If plan is discharge home, recommend the following: A lot of help with bathing/dressing/bathroom;A lot of help with walking and/or transfers;Assistance with cooking/housework;[name]   Can travel by private vehicle   No    Equipment Recommendations Other (comment) (defer to next venue)  Recommendations for Other Services  Rehab consult, OT consult   Functional Status Assessment Patient has had a recent decline in their functional status and demonstrates the ability to make significant improvements in function in a reasonable and predictable amount of time.      Precautions / Restrictions Precautions Precautions: Fall Precaution/Restrictions Comments: baseline tremors Restrictions Weight Bearing Restrictions Per Provider Order: No      Mobility  Bed Mobility Overal bed mobility: Needs Assistance Bed Mobility: Supine to Sit     Supine to sit: Supervision, HOB elevated, Used rails (uses UE to assist BLE to EOB, exit L side of bed)          Transfers Overall transfer level: Needs assistance Equipment used: Rolling walker (2 wheels) Transfers: Sit to/from Stand, Bed to chair/wheelchair/BSC Sit to Stand: Min assist (unable to transfer sit>Stand from EOB but pt with BUE on RW, able to transfer sit>stand from recliner with cuing re: hand placemen to push to standing, extra time to transition BUE to RW)          Lateral/Scoot Transfers: Supervision (bed>drop arm recliner on R)      Ambulation/Gait                  Stairs            Wheelchair Mobility     Tilt Bed    Modified Rankin (Stroke Patients Only)       Balance Overall balance assessment: Needs assistance Sitting-balance support: Feet supported Sitting balance-Leahy Scale: Good     Standing balance support: During functional activity, Bilateral upper extremity supported, Reliant on assistive device for balance Standing balance-Leahy Scale: Poor Standing balance comment: Pt able to perform standing marching in place with BUE on RW, no overt BLE buckling but pt fearful of falling.  Pertinent Vitals/Pain Pain Assessment Pain Assessment: No/denies pain    Home Living Family/patient expects to be discharged to:: Private residence Living Arrangements: Parent Available Help at Discharge: Family Type of Home: House Home Access: Ramped entrance     Alternate Level Stairs-Number of Steps: 1 step down to his living space Home Layout: Two level Home Equipment: Agricultural Consultant (2 wheels);Wheelchair - manual (DME  from his father who recently passed away)      Prior Function               Mobility Comments: Pt & mother report a decline over the past ~3 months, pt previously ambulatory without AD, driving but stopped driving 2/2 decreased ability to manage pedals with RLE, started using walker, at least 3 falls in the past 6 months. ADLs Comments: Was independent with bathing in walk in shower but recently transitioned to sponge bathes 2/2 progressive weakness.     Extremity/Trunk Assessment   Upper Extremity Assessment Upper Extremity Assessment: Generalized weakness (hx of tremors x ~24 years)    Lower Extremity Assessment Lower Extremity Assessment: Generalized weakness (2+/5 knee extension BLE)       Communication   Communication Communication: Impaired Factors Affecting Communication: Reduced clarity of speech (2/2 tremors)    Cognition Arousal: Alert Behavior During Therapy: Anxious   PT - Cognitive impairments: No apparent impairments                       PT - Cognition Comments: willing to participate Following commands: Impaired Following commands impaired: Follows multi-step commands with increased time     Cueing Cueing Techniques: Verbal cues     General Comments      Exercises     Assessment/Plan    PT Assessment Patient needs continued PT services  PT Problem List Decreased strength;Decreased coordination;Decreased range of motion;Decreased knowledge of use of DME;Decreased activity tolerance;Decreased balance;Decreased safety awareness;Decreased mobility       PT Treatment Interventions DME instruction;Balance training;Modalities;Gait training;Neuromuscular re-education;Functional mobility training;Stair training;Patient/family education;Therapeutic activities;Manual techniques;Therapeutic exercise;Wheelchair mobility training    PT Goals (Current goals can be found in the Care Plan section)  Acute Rehab PT Goals Patient Stated Goal: get  better PT Goal Formulation: With patient/family Time For Goal Achievement: 07/15/24 Potential to Achieve Goals: Good    Frequency Min 2X/week     Co-evaluation               AM-PAC PT 6 Clicks Mobility  Outcome Measure Help needed turning from your back to your side while in a flat bed without using bedrails?: A Little Help needed moving from lying on your back to sitting on the side of a flat bed without using bedrails?: A Little Help needed moving to and from a bed to a chair (including a wheelchair)?: A Little Help needed standing up from a chair using your arms (e.g., wheelchair or bedside chair)?: A Lot Help needed to walk in hospital room?: Total Help needed climbing 3-5 steps with a railing? : Total 6 Click Score: 13    End of Session   Activity Tolerance: Patient tolerated treatment well Patient left: in chair;with chair alarm set;with call bell/phone within reach;with family/visitor present;with nursing/sitter in room Nurse Communication: Mobility status PT Visit Diagnosis: Unsteadiness on feet (R26.81);Other abnormalities of gait and mobility (R26.89);Difficulty in walking, not elsewhere classified (R26.2);Muscle weakness (generalized) (M62.81);History of falling (Z91.81)    Time: 8962-8941 PT Time Calculation (min) (ACUTE ONLY): 21 min   Charges:  PT Evaluation $PT Eval Low Complexity: 1 Low   PT General Charges $$ ACUTE PT VISIT: 1 Visit         Richerd Pinal, PT, DPT 07/01/24, 11:10 AM   Richerd CHRISTELLA Pinal 07/01/2024, 11:08 AM

## 2024-07-01 NOTE — Progress Notes (Addendum)
 Progress Note    Greg [name].  FMW:[id number] DOB: February 03, 1966  DOA: 06/30/2024 PCP: Marylynn Verneita CROME, MD      Brief Narrative:    Medical records reviewed and are as summarized below:  Greg [name]. is a 58 y.o. male with medical history significant for type II DM, hypertension, dyslipidemia, schizoaffective bipolar disorder, who presented to the hospital because of general weakness and mechanical fall at home.  He has been having progressive weakness in the lower extremities for about months.  However, his mother reports that his weakness has gotten worse over the past few weeks.  Initially, weakness started on the right but has progressed to involve both lower extremities.  He saw Dr. Tobie, neurologist on 06/17/2024 for evaluation of lower extremity weakness.  EMG, NCS and CT lumbar spine had been ordered for further evaluation as an outpatient. His mother said patient used to walk with a walker but the day prior to admission, he developed sudden weakness in both lower extremities and fell to the ground.  This prompted a visit to the emergency department for further evaluation.  Of note, he is fallen a few times in the past few months.       Assessment/Plan:   Principal Problem:   Unable to ambulate Active Problems:   Hyponatremia   Schizoaffective disorder, bipolar type (HCC)   Type 2 diabetes mellitus without complications (HCC)   Essential hypertension   Weakness   Fall    Body mass index is 28.06 kg/m.   Progressive lower extremity weakness: CT lumbar spine did not show any acute abnormality but there is evidence of mild degenerative changes. MRI lumbar spine showed 1.  Mild lumbar disc degeneration and mild to moderate facet hypertrophy without stenosis.  2.  Right-sided lower lumbar paraspinal muscle edema and atrophy, possibly reflecting nonspecific subacute muscle injury. He was evaluated by PT and OT.  Acute inpatient rehab  recommended. Continue to follow-up with Dr. Tobie, neurologist, as an outpatient for further evaluation.   Hyponatremia: Improved.  Discontinue IV fluids.   Schizoaffective bipolar disorder: Continue lithium  and Depakote .  He also gets Invega  injections every 3 months as an outpatient.   Tremors: He has had tremors for years and his mother attributes this to antipsychotics.  He is on Ingrezza, propranolol  and benztropine   Hypertension: Continue propranolol .   Of note, one-time dose of IV Ativan  1 mg was ordered to be given prior to MRI so that patient could lay still for the test    Diet Order             Diet heart healthy/carb modified Room service appropriate? Yes; Fluid consistency: Thin; Fluid restriction: 1200 mL Fluid  Diet effective now                                  Consultants: None  Procedures: None    Medications:    benztropine  2 mg Oral QHS   divalproex   750 mg Oral Q12H   docusate sodium  100 mg Oral BID   enoxaparin (LOVENOX) injection  40 mg Subcutaneous Q24H   lithium  carbonate  300 mg Oral TID   Continuous Infusions:  sodium chloride  125 mL/hr (06/30/24 2204)     Anti-infectives (From admission, onward)    None              Family Communication/Anticipated D/C date and  plan/Code Status   DVT prophylaxis: enoxaparin (LOVENOX) injection 40 mg Start: 06/30/24 2200     Code Status: Limited: Do not attempt resuscitation (DNR) -DNR-LIMITED -Do Not Intubate/DNI   Family Communication: Plan discussed with his mother at the bedside Disposition Plan: To be determined   Status is: Observation The patient will require care spanning > 2 midnights and should be moved to inpatient because: Progressive lower extremity weakness, may need rehab.       Subjective:   Interval events noted.  He complains of weakness in bilateral legs.  No back pain.  His mother was at the bedside.  Objective:    Vitals:    07/01/24 0021 07/01/24 0513 07/01/24 0748 07/01/24 1449  BP: (!) 144/91 (!) 147/91 (!) 151/90 (!) 160/89  Pulse: 100 99 94 (!) 106  Resp:  20 20 17   Temp:  98 F (36.7 C) 97.7 F (36.5 C)   TempSrc:      SpO2: 96% 97% 97% 99%  Weight:      Height:       No data found.   Intake/Output Summary (Last 24 hours) at 07/01/2024 1638 Last data filed at 07/01/2024 1206 Gross per 24 hour  Intake 720 ml  Output 3250 ml  Net -2530 ml   Filed Weights   06/30/24 1340  Weight: 86.2 kg    Exam:  GEN: NAD SKIN: Warm and dry EYES: No pallor or icterus ENT: MMM CV: RRR PULM: CTA B ABD: soft, ND, NT, +BS CNS: AAO x 3, paraparesis with power of about 2/5.  Tremors of bilateral hands. EXT: No edema or tenderness        Data Reviewed:   I have personally reviewed following labs and imaging studies:  Labs: Labs show the following:   Basic Metabolic Panel: Recent Labs  Lab 06/30/24 1351 07/01/24 0135  NA 129* 136  K 4.0 4.0  CL 95* 102  CO2 24 26  GLUCOSE 239* 144*  BUN 10 8  CREATININE 0.62 0.57*  CALCIUM  9.3 8.8*   GFR Estimated Creatinine Clearance: 109.5 mL/min (A) (by C-G formula based on SCr of 0.57 mg/dL (L)). Liver Function Tests: Recent Labs  Lab 06/30/24 1351  AST 17  ALT 15  ALKPHOS 70  BILITOT 0.4  PROT 6.9  ALBUMIN 4.0   No results for input(s): LIPASE, AMYLASE in the last 168 hours. No results for input(s): AMMONIA in the last 168 hours. Coagulation profile No results for input(s): INR, PROTIME in the last 168 hours.  CBC: Recent Labs  Lab 06/30/24 1351 07/01/24 0135  WBC 8.6 7.4  HGB 14.1 13.0  HCT 42.8 38.8*  MCV 88.1 88.2  PLT 246 235   Cardiac Enzymes: No results for input(s): CKTOTAL, CKMB, CKMBINDEX, TROPONINI in the last 168 hours. BNP (last 3 results) No results for input(s): PROBNP in the last 8760 hours. CBG: Recent Labs  Lab 06/30/24 2336  GLUCAP 152*   D-Dimer: No results for input(s): DDIMER  in the last 72 hours. Hgb A1c: No results for input(s): HGBA1C in the last 72 hours. Lipid Profile: No results for input(s): CHOL, HDL, LDLCALC, TRIG, CHOLHDL, LDLDIRECT in the last 72 hours. Thyroid  function studies: Recent Labs    06/30/24 1351  TSH 3.100   Anemia work up: Recent Labs    07/01/24 1253  VITAMINB12 450   Sepsis Labs: Recent Labs  Lab 06/30/24 1351 07/01/24 0135  WBC 8.6 7.4    Microbiology No results found for this or  any previous visit (from the past 240 hours).  Procedures and diagnostic studies:  MR LUMBAR SPINE WO CONTRAST Result Date: 07/01/2024 EXAM: MRI LUMBAR SPINE 07/01/2024 12:28:31 PM TECHNIQUE: Multiplanar multisequence MRI of the lumbar spine was performed without the administration of intravenous contrast. COMPARISON: CT lumbar spine 06/30/2024. CLINICAL HISTORY: bilateral lower extremity weakness FINDINGS: BONES AND ALIGNMENT: 5 lumbar type vertebrae. Normal alignment. No fracture, suspicious marrow lesion, or significant marrow edema. SPINAL CORD: The conus medullaris terminates at L1 and is normal in signal. SOFT TISSUES: Abnormal STIR hyperintensity/edema involving the right multifidus muscle in the lower lumbar spine with evidence of asymmetric muscle atrophy. No fluid collection. Disc levels: Disc desiccation, greatest at L1-L2, L2-L3, and L5-S1. Mild disc space narrowing at L2-L3. T12-L1: Negative. L1-L2: Negative. L2-L3: Mild disc bulging eccentric to the left and mild facet hypertrophy without stenosis. L3-L4: Mild facet hypertrophy without disc herniation or stenosis. L4-L5: Mild disc bulging and mild to moderate facet hypertrophy without stenosis. L5-S1: Mild disc bulging, shallow central disc protrusion with annular fissure, and mild facet hypertrophy without stenosis. IMPRESSION: 1. Mild lumbar disc degeneration and mild to moderate facet hypertrophy without stenosis. 2. Right-sided lower lumbar paraspinal muscle edema and  atrophy, possibly reflecting nonspecific subacute muscle injury. Electronically signed by: Dasie Hamburg MD 07/01/2024 01:10 PM EST RP Workstation: [id number]   CT Lumbar Spine Wo Contrast Result Date: 06/30/2024 CLINICAL DATA:  Acute on chronic bilateral leg weakness, fall. EXAM: CT LUMBAR SPINE WITHOUT CONTRAST TECHNIQUE: Multidetector CT imaging of the lumbar spine was performed without intravenous contrast administration. Multiplanar CT image reconstructions were also generated. RADIATION DOSE REDUCTION: This exam was performed according to the departmental dose-optimization program which includes automated exposure control, adjustment of the mA and/or kV according to patient size and/or use of iterative reconstruction technique. COMPARISON:  None Available. FINDINGS: Segmentation: 5 lumbar type vertebrae. Alignment: Normal. Vertebrae: No acute fracture. Vertebral body heights are normal. The posterior elements are intact. No evidence of focal bone lesion or bony destructive change. Paraspinal and other soft tissues: No paraspinal hematoma. Aortic atherosclerosis. Disc levels: T11-T12: No spinal canal or neural foraminal stenosis T12-L1: No spinal canal or neural foraminal stenosis L1-L2: No spinal canal or neural foraminal stenosis L2-L3: No spinal canal or neural foraminal stenosis L3-L4: No spinal canal or neural foraminal stenosis L4-L5: Mild facet and ligamentum flavum hypertrophy. No spinal canal or neural foraminal stenosis L5-S1: Broad-based disc bulge. No spinal canal or neural foraminal stenosis IMPRESSION: 1. No acute fracture or subluxation of the lumbar spine. 2. Mild degenerative change in the lower lumbar spine. No spinal canal or neural foraminal stenosis. Aortic Atherosclerosis (ICD10-I70.0). Electronically Signed   By: Andrea Gasman M.D.   On: 06/30/2024 17:16   CT HEAD WO CONTRAST ( ) Result Date: 06/30/2024 CLINICAL DATA:  Trauma.  Worsening bilateral leg weakness. EXAM: CT HEAD  WITHOUT CONTRAST TECHNIQUE: Contiguous axial images were obtained from the base of the skull through the vertex without intravenous contrast. RADIATION DOSE REDUCTION: This exam was performed according to the departmental dose-optimization program which includes automated exposure control, adjustment of the mA and/or kV according to patient size and/or use of iterative reconstruction technique. COMPARISON:  Remote head CT 08/05/2012 FINDINGS: Brain: Mild motion artifact limitations. No intracranial hemorrhage, mass effect, or midline shift. No hydrocephalus. The basilar cisterns are patent. No evidence of territorial infarct or acute ischemia. No extra-axial or intracranial fluid collection. Vascular: No hyperdense vessel or unexpected calcification. Skull: No fracture or focal lesion. Sinuses/Orbits: No acute findings.  Ethmoid air cell mucosal thickening. No mastoid effusion. Other: No confluent scalp hematoma. IMPRESSION: No acute intracranial abnormality. No skull fracture. Electronically Signed   By: Andrea Gasman M.D.   On: 06/30/2024 17:10   CT Cervical Spine Wo Contrast Result Date: 06/30/2024 CLINICAL DATA:  Fall.  Worsening leg weakness. EXAM: CT CERVICAL SPINE WITHOUT CONTRAST TECHNIQUE: Multidetector CT imaging of the cervical spine was performed without intravenous contrast. Multiplanar CT image reconstructions were also generated. RADIATION DOSE REDUCTION: This exam was performed according to the departmental dose-optimization program which includes automated exposure control, adjustment of the mA and/or kV according to patient size and/or use of iterative reconstruction technique. COMPARISON:  None Available. FINDINGS: Alignment: Normal. Skull base and vertebrae: No evidence of acute cervical spine fracture or traumatic subluxation. Soft tissues and spinal canal: No prevertebral fluid or swelling. No visible canal hematoma. Disc levels: The disc heights are maintained. There is mild multilevel  facet hypertrophy, greatest on the left at C3-4. No evidence of large disc herniation, significant spinal stenosis or significant foraminal narrowing. Upper chest: Clear lung apices. Other: Bilateral carotid atherosclerosis. IMPRESSION: 1. No evidence of acute cervical spine fracture, traumatic subluxation or static signs of instability. 2. Mild cervical spondylosis. Electronically Signed   By: Elsie Perone M.D.   On: 06/30/2024 17:03               LOS: 0 days   Memori Sammon  [facility]   Pager on [email or web address]. If 7PM-7AM, please contact night-coverage at [email or web address]     07/01/2024, 4:38 PM

## 2024-07-01 NOTE — Plan of Care (Signed)
  Problem: Clinical Measurements: Goal: Will remain free from infection Outcome: Progressing Goal: Respiratory complications will improve Outcome: Progressing Goal: Cardiovascular complication will be avoided Outcome: Progressing   Problem: Nutrition: Goal: Adequate nutrition will be maintained Outcome: Progressing   Problem: Coping: Goal: Level of anxiety will decrease Outcome: Progressing   Problem: Pain Managment: Goal: General experience of comfort will improve and/or be controlled Outcome: Progressing   Problem: Safety: Goal: Ability to remain free from injury will improve Outcome: Progressing

## 2024-07-02 DIAGNOSIS — R29898 Other symptoms and signs involving the musculoskeletal system: Secondary | ICD-10-CM

## 2024-07-02 DIAGNOSIS — R262 Difficulty in walking, not elsewhere classified: Secondary | ICD-10-CM | POA: Diagnosis not present

## 2024-07-02 DIAGNOSIS — E871 Hypo-osmolality and hyponatremia: Secondary | ICD-10-CM | POA: Diagnosis not present

## 2024-07-02 NOTE — TOC PASRR Note (Signed)
 RE: [name] Date of Birth:  [birth year] Date: 07/02/24      To Whom It May Concern:   Please be advised that the above-named patient will require a short-term nursing home stay - anticipated 30 days or less for rehabilitation and strengthening.  The plan is for return home

## 2024-07-02 NOTE — Progress Notes (Signed)
 Physical Therapy Treatment Patient Details Name: Greg [name]. MRN: [id number] DOB: February 04, 1966 Today's Date: 07/02/2024   History of Present Illness Pt is a 58 y/o M admitted on 06/30/24 after presenting with c/o acute onset of generalized weakness & a fall. Pt is being treated for inability to ambulate that is possibly related to hyponatremia & recent anorexia. PMH: DM2, HTN, dyslipidemia, schizoaffective bipolar disorder, obesity    PT Comments  Patient received in bed, pleasant and agrees to PT/OT session. Patient is mod I with bed mobility. Transfers with min +2 assist from low bed. Patient performed marching in place, then ambulated 25 feet with RW and cga. (Chair follow for safety). Patient is limited by reported weakness in B Les. No LE buckling noted during session. He then performed B LE strengthening exercises in bed-seated and supine with assistance due to weakness. Patient will continue to benefit from skilled PT to improve B LE strength and safety with  mobility.           If plan is discharge home, recommend [name];[name];[name];Assist for transportation   Can travel by private vehicle     Yes  Equipment Recommendations  Rolling walker (2 wheels)    Recommendations for Other Services       Precautions / Restrictions Precautions Precautions: Fall Recall of Precautions/Restrictions: Intact Precaution/Restrictions Comments: baseline tremors in B UEs Restrictions Weight Bearing Restrictions Per Provider Order: No     Mobility  Bed Mobility Overal bed mobility: Modified Independent Bed Mobility: Supine to Sit, Sit to Supine     Supine to sit: Modified independent (Device/Increase time) Sit to supine: Modified independent (Device/Increase time)        Transfers Overall transfer level: Needs assistance Equipment used: Rolling walker (2  wheels) Transfers: Sit to/from Stand Sit to Stand: Min assist, +2 physical assistance                Ambulation/Gait Ambulation/Gait assistance: Contact guard assist Gait Distance (Feet): 25 Feet Assistive device: Rolling walker (2 wheels) Gait Pattern/deviations: Step-through pattern Gait velocity: decr     General Gait Details: ambulated 25 feet in room with +2 assist for safety ( chair follow). No LE buckling noted, just reported weakness.   Stairs             Wheelchair Mobility     Tilt Bed    Modified Rankin (Stroke Patients Only)       Balance Overall balance assessment: Needs assistance Sitting-balance support: Feet supported Sitting balance-Leahy Scale: Good     Standing balance support: Bilateral upper extremity supported, During functional activity, Reliant on assistive device for balance Standing balance-Leahy Scale: Good Standing balance comment: Marching in place, then ambulated 25 feet with RW.                            Communication Communication Communication: No apparent difficulties  Cognition Arousal: Alert Behavior During Therapy: WFL for tasks assessed/performed, Flat affect   PT - Cognitive impairments: No apparent impairments                       PT - Cognition Comments: willing to participate, however fearful? self limiting? Following commands: Impaired Following commands impaired: Follows multi-step commands with increased time    Cueing Cueing Techniques: Verbal cues  Exercises Other Exercises Other Exercises: LE  strengthening exercises: seated LAQ, marching x 5 reps, supine AP, Hip Abd/add, SLR x 5 reps with some assistance due to weakness    General Comments        Pertinent Vitals/Pain Pain Assessment Pain Assessment: No/denies pain    Home Living                          Prior Function            PT Goals (current goals can now be found in the care plan section) Acute  Rehab PT Goals Patient Stated Goal: get better PT Goal Formulation: With patient/family Time For Goal Achievement: 07/15/24 Potential to Achieve Goals: Fair Progress towards PT goals: Progressing toward goals    Frequency    Min 2X/week      PT Plan      Co-evaluation              AM-PAC PT 6 Clicks Mobility   Outcome Measure  Help needed turning from your back to your side while in a flat bed without using bedrails?: None Help needed moving from lying on your back to sitting on the side of a flat bed without using bedrails?: None Help needed moving to and from a bed to a chair (including a wheelchair)?: A Little Help needed standing up from a chair using your arms (e.g., wheelchair or bedside chair)?: A Little Help needed to walk in hospital room?: A Little Help needed climbing 3-5 steps with a railing? : Total 6 Click Score: 18    End of Session Equipment Utilized During Treatment: Gait belt Activity Tolerance: Patient tolerated treatment well Patient left: in bed;with call bell/phone within reach;with family/visitor present;with bed alarm set Nurse Communication: Mobility status PT Visit Diagnosis: Other abnormalities of gait and mobility (R26.89);Muscle weakness (generalized) (M62.81);Difficulty in walking, not elsewhere classified (R26.2)     Time: 1410-1430 PT Time Calculation (min) (ACUTE ONLY): 20 min  Charges:    $Therapeutic Exercise: 8-22 mins PT General Charges $$ ACUTE PT VISIT: 1 Visit                     Greg [name], PT, GCS 07/02/24,2:52 PM

## 2024-07-02 NOTE — Progress Notes (Signed)
 Occupational Therapy Treatment Patient Details Name: Greg [name]. MRN: [id number] DOB: 07-29-1966 Today's Date: 07/02/2024   History of present illness Pt is a 58 y/o M admitted on 06/30/24 after presenting with c/o acute onset of generalized weakness & a fall. Pt is being treated for inability to ambulate that is possibly related to hyponatremia & recent anorexia. PMH: DM2, HTN, dyslipidemia, schizoaffective bipolar disorder, obesity   OT comments  Upon entering the room, pt supine in bed and agreeable to OT intervention. Pt performing supine >sit with increased time and use of bed rail. Pt sitting on EOB with supervision. Pt stands with min A of 2 from EOB and needing encouragement to ambulate as he is self limiting and fearful of falling. Pt has chair follow for safety and ambulates ~ 20' in room with RW and min A. Pt returns back to bed at end of session. Call bell and all needed items within reach. Mother enters room as therapist exits.       If plan is discharge home, recommend the following:  A lot of help with walking and/or transfers;A lot of help with bathing/dressing/bathroom;Assistance with cooking/housework;Assist for transportation;[name]   Equipment Recommendations  BSC/3in1       Precautions / Restrictions Precautions Precautions: Fall Recall of Precautions/Restrictions: Intact Precaution/Restrictions Comments: baseline tremors in B UEs       Mobility Bed Mobility Overal bed mobility: Needs Assistance Bed Mobility: Supine to Sit, Sit to Supine     Supine to sit: Supervision Sit to supine: Supervision        Transfers Overall transfer level: Needs assistance Equipment used: Rolling walker (2 wheels) Transfers: Sit to/from Stand Sit to Stand: Min assist, +2 physical assistance                 Balance Overall balance assessment: Needs assistance Sitting-balance support: Feet supported Sitting balance-Leahy Scale:  Good     Standing balance support: Bilateral upper extremity supported, During functional activity, Reliant on assistive device for balance Standing balance-Leahy Scale: Good                             ADL either performed or assessed with clinical judgement    Extremity/Trunk Assessment Upper Extremity Assessment Upper Extremity Assessment: Generalized weakness   Lower Extremity Assessment Lower Extremity Assessment: Generalized weakness        Vision Patient Visual Report: No change from baseline           Communication Communication Communication: No apparent difficulties Factors Affecting Communication: Reduced clarity of speech   Cognition Arousal: Alert Behavior During Therapy: WFL for tasks assessed/performed, Flat affect Cognition: No apparent impairments                               Following commands: Impaired Following commands impaired: Follows multi-step commands with increased time      Cueing   Cueing Techniques: Verbal cues  Exercises              Pertinent Vitals/ Pain       Pain Assessment Pain Assessment: No/denies pain         Frequency  Min 3X/week        Progress Toward Goals  OT Goals(current goals can now be found in the care plan section)  Progress towards OT goals: Progressing toward goals  AM-PAC OT 6 Clicks Daily Activity     Outcome Measure   Help from another person eating meals?: None Help from another person taking care of personal grooming?: A Little Help from another person toileting, which includes using toliet, bedpan, or urinal?: A Lot Help from another person bathing (including washing, rinsing, drying)?: A Lot Help from another person to put on and taking off regular upper body clothing?: A Little Help from another person to put on and taking off regular lower body clothing?: A Lot 6 Click Score: 16    End of Session Equipment Utilized During Treatment: Rolling walker  (2 wheels);Gait belt  OT Visit Diagnosis: Other abnormalities of gait and mobility (R26.89);Muscle weakness (generalized) (M62.81)   Activity Tolerance Patient tolerated treatment well   Patient Left in bed;with call bell/phone within reach;with bed alarm set   Nurse Communication          Time: 8585-8568 OT Time Calculation (min): 17 min  Charges: OT General Charges $OT Visit: 1 Visit  Izetta Claude, MS, NEILA SCHIRMER ascom [phone]  07/02/24, 3:38 PM

## 2024-07-02 NOTE — [place] FL2 (Signed)
 [place]  MEDICAID [id number] LEVEL OF CARE FORM     IDENTIFICATION  Patient Name: Greg [name]. Birthdate: Jun 29, 1966 Sex: male Admission Date (Current Location): 06/30/2024  [place] and [place] Number:  Chiropodist and Address:  [facility], [address], [place], [place] [address]      Provider Number: [id number]  Attending Physician Name and Address:  Jens Durand, MD  Relative Name and Phone Number:       Current Level of Care: Hospital Recommended Level of Care: Skilled Nursing Facility Prior Approval Number:    Date Approved/Denied:   PASRR Number: pending  Discharge Plan: SNF    Current Diagnoses: Patient Active Problem List   Diagnosis Date Noted   Lower extremity weakness 06/30/2024   Type 2 diabetes mellitus without complications (HCC) 06/30/2024   Weakness 06/30/2024   Fall 06/30/2024   Tachycardia 04/23/2024   Hypotension due to hypovolemia 04/23/2024   Right leg weakness 04/23/2024   Schizoaffective disorder, bipolar type (HCC) 03/28/2016   Anxiety    Tremor due to multiple drugs 10/12/2015   Bipolar 2 disorder (HCC)    Hyponatremia 10/06/2015   Essential hypertension 11/28/2014   Diabetes (HCC) 11/28/2014   Tobacco use disorder 11/28/2014    Orientation RESPIRATION BLADDER Height & Weight     Self, Time, Situation, Place  Normal Continent Weight: 86.2 kg Height:  5' 9 (175.3 cm)  BEHAVIORAL SYMPTOMS/MOOD NEUROLOGICAL BOWEL NUTRITION STATUS      Continent Diet (Heart Healthy Carb modified)  AMBULATORY STATUS COMMUNICATION OF NEEDS Skin   Limited Assist Verbally Normal                       Personal Care Assistance Level of Assistance              Functional Limitations Info             SPECIAL CARE FACTORS FREQUENCY  PT (By licensed PT), OT (By licensed OT)                    Contractures Contractures Info: Not present    Additional Factors Info  Code Status, Allergies  Code Status Info: DNR Allergies Info: Haldol  (Haloperidol ), Seroquel (Quetiapine Fumarate)           Current Medications (07/02/2024):  This is the current hospital active medication list Current Facility-Administered Medications  Medication Dose Route Frequency Provider Last Rate Last Admin   acetaminophen  (TYLENOL ) tablet 650 mg  650 mg Oral Q6H PRN Mansy, Jan A, MD       Or   acetaminophen  (TYLENOL ) suppository 650 mg  650 mg Rectal Q6H PRN Mansy, Jan A, MD       benztropine (COGENTIN) tablet 2 mg  2 mg Oral QHS Mansy, Jan A, MD   2 mg at 07/01/24 2250   divalproex  (DEPAKOTE ) DR tablet 750 mg  750 mg Oral Q12H Mansy, Jan A, MD   750 mg at 07/02/24 0910   docusate sodium (COLACE) capsule 100 mg  100 mg Oral BID Jens Durand, MD   100 mg at 07/02/24 0910   enoxaparin (LOVENOX) injection 40 mg  40 mg Subcutaneous Q24H Mansy, Jan A, MD   40 mg at 07/01/24 2210   lithium  carbonate capsule 300 mg  300 mg Oral TID Mansy, Jan A, MD   300 mg at 07/02/24 1537   magnesium  hydroxide (MILK OF MAGNESIA) suspension 30 mL  30 mL Oral Daily  PRN Mansy, Madison LABOR, MD       ondansetron  (ZOFRAN ) tablet 4 mg  4 mg Oral Q6H PRN Mansy, Jan A, MD       Or   ondansetron  (ZOFRAN ) injection 4 mg  4 mg Intravenous Q6H PRN Mansy, Jan A, MD       traZODone  (DESYREL ) tablet 25 mg  25 mg Oral QHS PRN Mansy, Madison LABOR, MD         Discharge Medications: Please see discharge summary for a list of discharge medications.  Relevant Imaging Results:  Relevant Lab Results:   Additional Information ss [id number]  Corean ONEIDA Haddock, RN

## 2024-07-02 NOTE — Plan of Care (Signed)

## 2024-07-02 NOTE — Progress Notes (Addendum)
 Progress Note    Greg [name].  FMW:[id number] DOB: 1965-09-15  DOA: 06/30/2024 PCP: Marylynn Verneita CROME, MD      Brief Narrative:    Medical records reviewed and are as summarized below:  Greg [name]. is a 58 y.o. male with medical history significant for type II DM, hypertension, dyslipidemia, schizoaffective bipolar disorder, who presented to the hospital because of general weakness and mechanical fall at home.  He has been having progressive weakness in the lower extremities for about months.  However, his mother reports that his weakness has gotten worse over the past few weeks.  Initially, weakness started on the right but has progressed to involve both lower extremities.  He saw Dr. Tobie, neurologist on 06/17/2024 for evaluation of lower extremity weakness.  EMG, NCS and CT lumbar spine had been ordered for further evaluation as an outpatient. His mother said patient used to walk with a walker but the day prior to admission, he developed sudden weakness in both lower extremities and fell to the ground.  This prompted a visit to the emergency department for further evaluation.  Of note, he is fallen a few times in the past few months.       Assessment/Plan:   Principal Problem:   Lower extremity weakness Active Problems:   Hyponatremia   Schizoaffective disorder, bipolar type (HCC)   Type 2 diabetes mellitus without complications (HCC)   Essential hypertension   Weakness   Fall    Body mass index is 28.06 kg/m.   Progressive lower extremity weakness: CT lumbar spine did not show any acute abnormality but there is evidence of mild degenerative changes. MRI lumbar spine showed 1.  Mild lumbar disc degeneration and mild to moderate facet hypertrophy without stenosis.  2.  Right-sided lower lumbar paraspinal muscle edema and atrophy, possibly reflecting nonspecific subacute muscle injury. He was evaluated by PT and OT.  Acute inpatient rehab  recommended. Continue to follow-up with Dr. Tobie, neurologist, as an outpatient for further evaluation.   Hyponatremia: Improved with IV fluids.   Schizoaffective bipolar disorder: Continue lithium  and Depakote .  He also gets Invega  injections every 3 months as an outpatient.   Tremors: He has had tremors for years and his mother attributes this to antipsychotics.  He is on Ingrezza, propranolol  and benztropine   Hypertension: Continue propranolol .      Diet Order             Diet heart healthy/carb modified Room service appropriate? Yes; Fluid consistency: Thin; Fluid restriction: 1200 mL Fluid  Diet effective now                                  Consultants: None  Procedures: None    Medications:    benztropine  2 mg Oral QHS   divalproex   750 mg Oral Q12H   docusate sodium  100 mg Oral BID   enoxaparin (LOVENOX) injection  40 mg Subcutaneous Q24H   lithium  carbonate  300 mg Oral TID   Continuous Infusions:     Anti-infectives (From admission, onward)    None              Family Communication/Anticipated D/C date and plan/Code Status   DVT prophylaxis: enoxaparin (LOVENOX) injection 40 mg Start: 06/30/24 2200     Code Status: Limited: Do not attempt resuscitation (DNR) -DNR-LIMITED -Do Not Intubate/DNI   Family Communication:  None Disposition Plan: Plan to discharge inpatient rehab or SNF   Status is: Observation The patient will require care spanning > 2 midnights and should be moved to inpatient because: Progressive lower extremity weakness, needs SNF/inpatient rehab at discharge       Subjective:   No complaints.  He still feels weak in the legs still he was able to work walk with the physical therapist  Objective:    Vitals:   07/01/24 0748 07/01/24 1449 07/01/24 2116 07/02/24 0733  BP: (!) 151/90 (!) 160/89 (!) 160/92 139/87  Pulse: 94 (!) 106 (!) 101 88  Resp: 20 17 18 17   Temp: 97.7 F (36.5 C)   98.8 F (37.1 C) 97.8 F (36.6 C)  TempSrc:   Oral Oral  SpO2: 97% 99% 100% 100%  Weight:      Height:       No data found.   Intake/Output Summary (Last 24 hours) at 07/02/2024 1515 Last data filed at 07/02/2024 1321 Gross per 24 hour  Intake 1190 ml  Output 3250 ml  Net -2060 ml   Filed Weights   06/30/24 1340  Weight: 86.2 kg    Exam:  GEN: NAD SKIN: Warm and dry EYES: No pallor or icterus ENT: MMM CV: RRR PULM: CTA B ABD: soft, ND, NT, +BS CNS: AAO x 3, lower extremity paraparesis with power of 2/5 EXT: No edema or tenderness         Data Reviewed:   I have personally reviewed following labs and imaging studies:  Labs: Labs show the following:   Basic Metabolic Panel: Recent Labs  Lab 06/30/24 1351 07/01/24 0135  NA 129* 136  K 4.0 4.0  CL 95* 102  CO2 24 26  GLUCOSE 239* 144*  BUN 10 8  CREATININE 0.62 0.57*  CALCIUM  9.3 8.8*   GFR Estimated Creatinine Clearance: 109.5 mL/min (A) (by C-G formula based on SCr of 0.57 mg/dL (L)). Liver Function Tests: Recent Labs  Lab 06/30/24 1351  AST 17  ALT 15  ALKPHOS 70  BILITOT 0.4  PROT 6.9  ALBUMIN 4.0   No results for input(s): LIPASE, AMYLASE in the last 168 hours. No results for input(s): AMMONIA in the last 168 hours. Coagulation profile No results for input(s): INR, PROTIME in the last 168 hours.  CBC: Recent Labs  Lab 06/30/24 1351 07/01/24 0135  WBC 8.6 7.4  HGB 14.1 13.0  HCT 42.8 38.8*  MCV 88.1 88.2  PLT 246 235   Cardiac Enzymes: No results for input(s): CKTOTAL, CKMB, CKMBINDEX, TROPONINI in the last 168 hours. BNP (last 3 results) No results for input(s): PROBNP in the last 8760 hours. CBG: Recent Labs  Lab 06/30/24 2336  GLUCAP 152*   D-Dimer: No results for input(s): DDIMER in the last 72 hours. Hgb A1c: No results for input(s): HGBA1C in the last 72 hours. Lipid Profile: No results for input(s): CHOL, HDL, LDLCALC, TRIG,  CHOLHDL, LDLDIRECT in the last 72 hours. Thyroid  function studies: Recent Labs    06/30/24 1351  TSH 3.100   Anemia work up: Recent Labs    07/01/24 1253  VITAMINB12 450   Sepsis Labs: Recent Labs  Lab 06/30/24 1351 07/01/24 0135  WBC 8.6 7.4    Microbiology No results found for this or any previous visit (from the past 240 hours).  Procedures and diagnostic studies:  MR LUMBAR SPINE WO CONTRAST Result Date: 07/01/2024 EXAM: MRI LUMBAR SPINE 07/01/2024 12:28:31 PM TECHNIQUE: Multiplanar multisequence MRI of the lumbar  spine was performed without the administration of intravenous contrast. COMPARISON: CT lumbar spine 06/30/2024. CLINICAL HISTORY: bilateral lower extremity weakness FINDINGS: BONES AND ALIGNMENT: 5 lumbar type vertebrae. Normal alignment. No fracture, suspicious marrow lesion, or significant marrow edema. SPINAL CORD: The conus medullaris terminates at L1 and is normal in signal. SOFT TISSUES: Abnormal STIR hyperintensity/edema involving the right multifidus muscle in the lower lumbar spine with evidence of asymmetric muscle atrophy. No fluid collection. Disc levels: Disc desiccation, greatest at L1-L2, L2-L3, and L5-S1. Mild disc space narrowing at L2-L3. T12-L1: Negative. L1-L2: Negative. L2-L3: Mild disc bulging eccentric to the left and mild facet hypertrophy without stenosis. L3-L4: Mild facet hypertrophy without disc herniation or stenosis. L4-L5: Mild disc bulging and mild to moderate facet hypertrophy without stenosis. L5-S1: Mild disc bulging, shallow central disc protrusion with annular fissure, and mild facet hypertrophy without stenosis. IMPRESSION: 1. Mild lumbar disc degeneration and mild to moderate facet hypertrophy without stenosis. 2. Right-sided lower lumbar paraspinal muscle edema and atrophy, possibly reflecting nonspecific subacute muscle injury. Electronically signed by: Dasie Hamburg MD 07/01/2024 01:10 PM EST RP Workstation: [id number]   CT Lumbar  Spine Wo Contrast Result Date: 06/30/2024 CLINICAL DATA:  Acute on chronic bilateral leg weakness, fall. EXAM: CT LUMBAR SPINE WITHOUT CONTRAST TECHNIQUE: Multidetector CT imaging of the lumbar spine was performed without intravenous contrast administration. Multiplanar CT image reconstructions were also generated. RADIATION DOSE REDUCTION: This exam was performed according to the departmental dose-optimization program which includes automated exposure control, adjustment of the mA and/or kV according to patient size and/or use of iterative reconstruction technique. COMPARISON:  None Available. FINDINGS: Segmentation: 5 lumbar type vertebrae. Alignment: Normal. Vertebrae: No acute fracture. Vertebral body heights are normal. The posterior elements are intact. No evidence of focal bone lesion or bony destructive change. Paraspinal and other soft tissues: No paraspinal hematoma. Aortic atherosclerosis. Disc levels: T11-T12: No spinal canal or neural foraminal stenosis T12-L1: No spinal canal or neural foraminal stenosis L1-L2: No spinal canal or neural foraminal stenosis L2-L3: No spinal canal or neural foraminal stenosis L3-L4: No spinal canal or neural foraminal stenosis L4-L5: Mild facet and ligamentum flavum hypertrophy. No spinal canal or neural foraminal stenosis L5-S1: Broad-based disc bulge. No spinal canal or neural foraminal stenosis IMPRESSION: 1. No acute fracture or subluxation of the lumbar spine. 2. Mild degenerative change in the lower lumbar spine. No spinal canal or neural foraminal stenosis. Aortic Atherosclerosis (ICD10-I70.0). Electronically Signed   By: Andrea Gasman M.D.   On: 06/30/2024 17:16   CT HEAD WO CONTRAST ( ) Result Date: 06/30/2024 CLINICAL DATA:  Trauma.  Worsening bilateral leg weakness. EXAM: CT HEAD WITHOUT CONTRAST TECHNIQUE: Contiguous axial images were obtained from the base of the skull through the vertex without intravenous contrast. RADIATION DOSE REDUCTION: This  exam was performed according to the departmental dose-optimization program which includes automated exposure control, adjustment of the mA and/or kV according to patient size and/or use of iterative reconstruction technique. COMPARISON:  Remote head CT 08/05/2012 FINDINGS: Brain: Mild motion artifact limitations. No intracranial hemorrhage, mass effect, or midline shift. No hydrocephalus. The basilar cisterns are patent. No evidence of territorial infarct or acute ischemia. No extra-axial or intracranial fluid collection. Vascular: No hyperdense vessel or unexpected calcification. Skull: No fracture or focal lesion. Sinuses/Orbits: No acute findings. Ethmoid air cell mucosal thickening. No mastoid effusion. Other: No confluent scalp hematoma. IMPRESSION: No acute intracranial abnormality. No skull fracture. Electronically Signed   By: Andrea Gasman M.D.   On: 06/30/2024 17:10  CT Cervical Spine Wo Contrast Result Date: 06/30/2024 CLINICAL DATA:  Fall.  Worsening leg weakness. EXAM: CT CERVICAL SPINE WITHOUT CONTRAST TECHNIQUE: Multidetector CT imaging of the cervical spine was performed without intravenous contrast. Multiplanar CT image reconstructions were also generated. RADIATION DOSE REDUCTION: This exam was performed according to the departmental dose-optimization program which includes automated exposure control, adjustment of the mA and/or kV according to patient size and/or use of iterative reconstruction technique. COMPARISON:  None Available. FINDINGS: Alignment: Normal. Skull base and vertebrae: No evidence of acute cervical spine fracture or traumatic subluxation. Soft tissues and spinal canal: No prevertebral fluid or swelling. No visible canal hematoma. Disc levels: The disc heights are maintained. There is mild multilevel facet hypertrophy, greatest on the left at C3-4. No evidence of large disc herniation, significant spinal stenosis or significant foraminal narrowing. Upper chest: Clear lung  apices. Other: Bilateral carotid atherosclerosis. IMPRESSION: 1. No evidence of acute cervical spine fracture, traumatic subluxation or static signs of instability. 2. Mild cervical spondylosis. Electronically Signed   By: Elsie Perone M.D.   On: 06/30/2024 17:03               LOS: 0 days   Tyric Rodeheaver  [facility]   Pager on [email or web address]. If 7PM-7AM, please contact night-coverage at [email or web address]     07/02/2024, 3:15 PM

## 2024-07-02 NOTE — TOC Progression Note (Addendum)
 Transition of Care (TOC) - Progression Note    Patient Details  Name: Greg [name]. MRN: [id number] Date of Birth: [birth year]  [facility] CM/SW Contact  Corean ONEIDA Haddock, RN Phone Number: 07/02/2024, 2:00 PM  Clinical Narrative:     Per Admissions at [facility] inpatient rehab patient does not currently meet their requirements  Met with patient at bedside Discussed option about checking in with other inpatient rehabs, vs SNF level of care for rehab.  Patient states he wishes to pursue inpatient rehab.   VM left for Deatrice at [facility] with Lashonda at [facility] in [place] - referral faxed to [phone]  [facility] to start SNF bed search as backup  Update:  Met with patient and mother Lyndia at bedside today. Patient has decided to purse SNF level of care instead of inpatient rehab.  Patient defers to his mother to select facility.  Patient wants STR --> LTC.   PASRR pending.  Once clinical signed will submit to [facility], Bed search initiated   Expected Discharge Plan: IP Rehab Facility Barriers to Discharge: Continued Medical Work up               Expected Discharge Plan and Services       Living arrangements for the past 2 months: Single Family Home                                       Social Drivers of Health (SDOH) Interventions SDOH Screenings   Food Insecurity: No Food Insecurity (06/30/2024)  Housing: Low Risk  (06/30/2024)  Transportation Needs: No Transportation Needs (06/30/2024)  Utilities: Not At Risk (06/30/2024)  Alcohol Screen: Low Risk  (06/29/2017)  Depression (PHQ2-9): Medium Risk (04/22/2024)  Tobacco Use: High Risk (06/30/2024)    Readmission Risk Interventions     No data to display

## 2024-07-02 NOTE — Plan of Care (Signed)
  Problem: Clinical Measurements: Goal: Ability to maintain clinical measurements within normal limits will improve Outcome: Progressing Goal: Will remain free from infection Outcome: Progressing Goal: Respiratory complications will improve Outcome: Progressing Goal: Cardiovascular complication will be avoided Outcome: Progressing   Problem: Nutrition: Goal: Adequate nutrition will be maintained Outcome: Progressing   Problem: Coping: Goal: Level of anxiety will decrease Outcome: Progressing   Problem: Elimination: Goal: Will not experience complications related to urinary retention Outcome: Progressing   Problem: Pain Managment: Goal: General experience of comfort will improve and/or be controlled Outcome: Progressing   Problem: Safety: Goal: Ability to remain free from injury will improve Outcome: Progressing

## 2024-07-03 DIAGNOSIS — R29898 Other symptoms and signs involving the musculoskeletal system: Secondary | ICD-10-CM | POA: Diagnosis not present

## 2024-07-03 DIAGNOSIS — E871 Hypo-osmolality and hyponatremia: Secondary | ICD-10-CM | POA: Diagnosis not present

## 2024-07-03 NOTE — Plan of Care (Signed)
  Problem: Clinical Measurements: Goal: Ability to maintain clinical measurements within normal limits will improve Outcome: Progressing Goal: Will remain free from infection Outcome: Progressing Goal: Diagnostic test results will improve Outcome: Progressing Goal: Respiratory complications will improve Outcome: Progressing Goal: Cardiovascular complication will be avoided Outcome: Progressing   Problem: Activity: Goal: Risk for activity intolerance will decrease Outcome: Progressing   Problem: Nutrition: Goal: Adequate nutrition will be maintained Outcome: Progressing   Problem: Coping: Goal: Level of anxiety will decrease Outcome: Progressing   Problem: Elimination: Goal: Will not experience complications related to bowel motility Outcome: Progressing Goal: Will not experience complications related to urinary retention Outcome: Progressing   Problem: Pain Managment: Goal: General experience of comfort will improve and/or be controlled Outcome: Progressing   Problem: Safety: Goal: Ability to remain free from injury will improve Outcome: Progressing

## 2024-07-03 NOTE — TOC Transition Note (Signed)
 Transition of Care [facility]) - Discharge Note   Patient Details  Name: Greg [name]. MRN: [id number] Date of Birth: 30-Jan-1966  Transition of Care [facility]) CM/SW Contact:  Corean ONEIDA Haddock, RN Phone Number: 07/03/2024, 3:03 PM   Clinical Narrative:     Patient will DC un:Ezjx resources  Anticipated DC date: 07/03/24  Family notified: Mother Armed Forces Technical Officer by:Mother Customer service manager   Per MD patient ready for DC to . RN, patient, patient's family, and facility notified of DC. Discharge Summary sent to facility. RN given number for report. DC packet on chart. TOC signing off.    Final next level of care: IP Rehab Facility Barriers to Discharge: Continued Medical Work up   Patient Goals and CMS Choice            Discharge Placement                       Discharge Plan and Services Additional resources added to the After Visit Summary for                                       Social Drivers of Health (SDOH) Interventions SDOH Screenings   Food Insecurity: No Food Insecurity (06/30/2024)  Housing: Low Risk  (06/30/2024)  Transportation Needs: No Transportation Needs (06/30/2024)  Utilities: Not At Risk (06/30/2024)  Alcohol Screen: Low Risk  (06/29/2017)  Depression (PHQ2-9): Medium Risk (04/22/2024)  Tobacco Use: High Risk (06/30/2024)     Readmission Risk Interventions     No data to display

## 2024-07-03 NOTE — TOC Progression Note (Addendum)
 Transition of Care (TOC) - Progression Note    Patient Details  Name: Greg [name]. MRN: [id number] Date of Birth: June 15, 1966  Transition [facility] Care [facility]) CM/SW Contact  Corean ONEIDA Haddock, RN Phone Number: 07/03/2024, 8:57 AM  Clinical Narrative:    Message sent to all facilities that have offered a bed to confirm that they saw it was for short term rehab then transition to LTC, prior to offering bed offers  PASSR under manuel review   Update: bed offers presented to mother Lyndia per patient request.  She is going to review with her sister and call me with her decision    Expected Discharge Plan: IP Rehab Facility Barriers to Discharge: Continued Medical Work up               Expected Discharge Plan and Services       Living arrangements for the past 2 months: Single Family Home                                       Social Drivers of Health (SDOH) Interventions SDOH Screenings   Food Insecurity: No Food Insecurity (06/30/2024)  Housing: Low Risk  (06/30/2024)  Transportation Needs: No Transportation Needs (06/30/2024)  Utilities: Not At Risk (06/30/2024)  Alcohol Screen: Low Risk  (06/29/2017)  Depression (PHQ2-9): Medium Risk (04/22/2024)  Tobacco Use: High Risk (06/30/2024)    Readmission Risk Interventions     No data to display

## 2024-07-03 NOTE — Discharge Summary (Signed)
 Physician Discharge Summary   Patient: Greg [name]. MRN: [id number] DOB: [birth year]  Admit date:     06/30/2024  Discharge date: 07/03/24  Discharge Physician: Greg [name]   PCP: Greg Verneita CROME, MD   Recommendations at discharge:   Follow-up with Greg [name], neurologist, for ongoing workup of lower extremity weakness  Discharge Diagnoses: Principal Problem:   Lower extremity weakness Active Problems:   Hyponatremia   Schizoaffective disorder, bipolar type (HCC)   Type 2 diabetes mellitus without complications (HCC)   Essential hypertension   Weakness   Fall  Resolved Problems:   * No resolved hospital problems. *  Hospital Course:  Greg [name]. is a 58 y.o. male with medical history significant for type II DM, hypertension, dyslipidemia, schizoaffective bipolar disorder, who presented to the hospital because of general weakness and mechanical fall at home.  He has been having progressive weakness in the lower extremities for about months.  However, his mother reports that his weakness has gotten worse over the past few weeks.  Initially, weakness started on the right but has progressed to involve both lower extremities.  He saw Dr. Blanch, neurologist on 06/17/2024 for evaluation of lower extremity weakness.  EMG, NCS and CT lumbar spine had been ordered for further evaluation as an outpatient. His mother said patient used to walk with a walker but the day prior to admission, he developed sudden weakness in both lower extremities and fell to the ground.  This prompted a visit to the emergency department for further evaluation.  Of note, he is fallen a few times in the past few months.     Assessment and Plan:   Progressive lower extremity weakness: CT lumbar spine did not show any acute abnormality but there is evidence of mild degenerative changes. MRI lumbar spine showed 1.  Mild lumbar disc degeneration and mild to moderate facet hypertrophy  without stenosis.  2.  Right-sided lower lumbar paraspinal muscle edema and atrophy, possibly reflecting nonspecific subacute muscle injury. He was evaluated by PT and OT.  He will need skilled rehab at [facility] Continue to follow-up with Dr. Blanch, neurologist, as an outpatient for further evaluation.     Hyponatremia: Improved with IV fluids.     Schizoaffective bipolar disorder: Continue lithium  and Depakote .  He also gets Invega  injections every 3 months as an outpatient.     Tremors: He has had tremors for years and his mother attributes this to antipsychotics.  He is on Ingrezza, propranolol  and benztropine      Hypertension: Continue propranolol .   He is deemed medically stable for discharge to SNF today.      Consultants: None Procedures performed: None Disposition: Skilled nursing facility Diet recommendation:  Discharge Diet Orders (From admission, onward)     Start     Ordered   07/03/24 0000  Diet - low sodium heart healthy        07/03/24 1434           Cardiac and Carb modified diet DISCHARGE MEDICATION: Allergies as of 07/03/2024       Reactions   Haldol  [haloperidol ] Anaphylaxis   Seroquel [quetiapine Fumarate] Other (See Comments)   Reaction: morbid, depressed        Medication List     STOP taking these medications    paliperidone  1.5 MG 24 hr tablet Commonly known as: INVEGA        TAKE these medications    benztropine  2 MG tablet Commonly known as: COGENTIN   Take 2 mg by mouth at bedtime.   divalproex  250 MG DR tablet Commonly known as: DEPAKOTE  Take 3 tablets (750 mg total) by mouth every 12 (twelve) hours.   FreeStyle Libre 3 Plus Sensor Misc Change sensor every 15 days.   Ingrezza 40 MG capsule Generic drug: valbenazine Take 40 mg by mouth daily.   Invega  Trinza 819 MG/2.63ML injection Generic drug: paliperidone  Palmitate ER Inject 819 mg into the muscle every 3 (three) months.   lithium  carbonate 300 MG capsule Take  300 mg by mouth 3 (three) times daily.   metFORMIN  500 MG 24 hr tablet Commonly known as: GLUCOPHAGE -XR Take 1 tablet (500 mg total) by mouth daily with breakfast.   propranolol  20 MG tablet Commonly known as: INDERAL  Take 20 mg by mouth 2 (two) times daily.        Contact information for after-discharge care     Destination     [facility] [place], [place]. [place]   Service: Skilled Nursing Contact information: [address] [place]  [address] [phone]                    Discharge Exam: Greg [name]   06/30/24 1340  Weight: 86.2 kg   GEN: NAD SKIN: Warm and dry EYES: No pallor or icterus ENT: MMM CV: RRR PULM: CTA B ABD: soft, ND, NT, +BS CNS: AAO x 3, bilateral lower extremity paraparesis (power 2/5) EXT: No edema or tenderness   Condition at discharge: good  The results of significant diagnostics from this hospitalization (including imaging, microbiology, ancillary and laboratory) are listed below for reference.   Imaging Studies: MR LUMBAR SPINE WO CONTRAST Result Date: 07/01/2024 EXAM: MRI LUMBAR SPINE 07/01/2024 12:28:31 PM TECHNIQUE: Multiplanar multisequence MRI of the lumbar spine was performed without the administration of intravenous contrast. COMPARISON: CT lumbar spine 06/30/2024. CLINICAL HISTORY: bilateral lower extremity weakness FINDINGS: BONES AND ALIGNMENT: 5 lumbar type vertebrae. Normal alignment. No fracture, suspicious marrow lesion, or significant marrow edema. SPINAL CORD: The conus medullaris terminates at L1 and is normal in signal. SOFT TISSUES: Abnormal STIR hyperintensity/edema involving the right multifidus muscle in the lower lumbar spine with evidence of asymmetric muscle atrophy. No fluid collection. Disc levels: Disc desiccation, greatest at L1-L2, L2-L3, and L5-S1. Mild disc space narrowing at L2-L3. T12-L1: Negative. L1-L2: Negative. L2-L3: Mild disc bulging eccentric to the left and mild facet hypertrophy  without stenosis. L3-L4: Mild facet hypertrophy without disc herniation or stenosis. L4-L5: Mild disc bulging and mild to moderate facet hypertrophy without stenosis. L5-S1: Mild disc bulging, shallow central disc protrusion with annular fissure, and mild facet hypertrophy without stenosis. IMPRESSION: 1. Mild lumbar disc degeneration and mild to moderate facet hypertrophy without stenosis. 2. Right-sided lower lumbar paraspinal muscle edema and atrophy, possibly reflecting nonspecific subacute muscle injury. Electronically signed by: Dasie Hamburg MD 07/01/2024 01:10 PM EST RP Workstation: [id number]   CT Lumbar Spine Wo Contrast Result Date: 06/30/2024 CLINICAL DATA:  Acute on chronic bilateral leg weakness, fall. EXAM: CT LUMBAR SPINE WITHOUT CONTRAST TECHNIQUE: Multidetector CT imaging of the lumbar spine was performed without intravenous contrast administration. Multiplanar CT image reconstructions were also generated. RADIATION DOSE REDUCTION: This exam was performed according to the departmental dose-optimization program which includes automated exposure control, adjustment of the mA and/or kV according to patient size and/or use of iterative reconstruction technique. COMPARISON:  None Available. FINDINGS: Segmentation: 5 lumbar type vertebrae. Alignment: Normal. Vertebrae: No acute fracture. Vertebral body heights are normal. The posterior elements are  intact. No evidence of focal bone lesion or bony destructive change. Paraspinal and other soft tissues: No paraspinal hematoma. Aortic atherosclerosis. Disc levels: T11-T12: No spinal canal or neural foraminal stenosis T12-L1: No spinal canal or neural foraminal stenosis L1-L2: No spinal canal or neural foraminal stenosis L2-L3: No spinal canal or neural foraminal stenosis L3-L4: No spinal canal or neural foraminal stenosis L4-L5: Mild facet and ligamentum flavum hypertrophy. No spinal canal or neural foraminal stenosis L5-S1: Broad-based disc bulge. No  spinal canal or neural foraminal stenosis IMPRESSION: 1. No acute fracture or subluxation of the lumbar spine. 2. Mild degenerative change in the lower lumbar spine. No spinal canal or neural foraminal stenosis. Aortic Atherosclerosis (ICD10-I70.0). Electronically Signed   By: Andrea Gasman M.D.   On: 06/30/2024 17:16   CT HEAD WO CONTRAST ( ) Result Date: 06/30/2024 CLINICAL DATA:  Trauma.  Worsening bilateral leg weakness. EXAM: CT HEAD WITHOUT CONTRAST TECHNIQUE: Contiguous axial images were obtained from the base of the skull through the vertex without intravenous contrast. RADIATION DOSE REDUCTION: This exam was performed according to the departmental dose-optimization program which includes automated exposure control, adjustment of the mA and/or kV according to patient size and/or use of iterative reconstruction technique. COMPARISON:  Remote head CT 08/05/2012 FINDINGS: Brain: Mild motion artifact limitations. No intracranial hemorrhage, mass effect, or midline shift. No hydrocephalus. The basilar cisterns are patent. No evidence of territorial infarct or acute ischemia. No extra-axial or intracranial fluid collection. Vascular: No hyperdense vessel or unexpected calcification. Skull: No fracture or focal lesion. Sinuses/Orbits: No acute findings. Ethmoid air cell mucosal thickening. No mastoid effusion. Other: No confluent scalp hematoma. IMPRESSION: No acute intracranial abnormality. No skull fracture. Electronically Signed   By: Andrea Gasman M.D.   On: 06/30/2024 17:10   CT Cervical Spine Wo Contrast Result Date: 06/30/2024 CLINICAL DATA:  Fall.  Worsening leg weakness. EXAM: CT CERVICAL SPINE WITHOUT CONTRAST TECHNIQUE: Multidetector CT imaging of the cervical spine was performed without intravenous contrast. Multiplanar CT image reconstructions were also generated. RADIATION DOSE REDUCTION: This exam was performed according to the departmental dose-optimization program which includes  automated exposure control, adjustment of the mA and/or kV according to patient size and/or use of iterative reconstruction technique. COMPARISON:  None Available. FINDINGS: Alignment: Normal. Skull base and vertebrae: No evidence of acute cervical spine fracture or traumatic subluxation. Soft tissues and spinal canal: No prevertebral fluid or swelling. No visible canal hematoma. Disc levels: The disc heights are maintained. There is mild multilevel facet hypertrophy, greatest on the left at C3-4. No evidence of large disc herniation, significant spinal stenosis or significant foraminal narrowing. Upper chest: Clear lung apices. Other: Bilateral carotid atherosclerosis. IMPRESSION: 1. No evidence of acute cervical spine fracture, traumatic subluxation or static signs of instability. 2. Mild cervical spondylosis. Electronically Signed   By: Elsie Perone M.D.   On: 06/30/2024 17:03    Microbiology: Results for orders placed or performed during the hospital encounter of 03/27/16  Urine culture     Status: None   Collection Time: 03/27/16  3:34 AM   Specimen: Urine, Clean Catch  Result Value Ref Range Status   Specimen Description URINE, CLEAN CATCH  Final   Special Requests Normal  Final   Culture NO GROWTH Performed at [facility]   Final   Report Status 03/28/2016 FINAL  Final  MRSA PCR Screening     Status: None   Collection Time: 03/30/16  8:06 AM   Specimen: Nasal Mucosa; Nasopharyngeal  Result Value  Ref Range Status   MRSA by PCR NEGATIVE NEGATIVE Final    Comment:        The GeneXpert MRSA Assay (FDA approved for NASAL specimens only), is one component of a comprehensive MRSA colonization surveillance program. It is not intended to diagnose MRSA infection nor to guide or monitor treatment for MRSA infections.     Labs: CBC: Recent Labs  Lab 06/30/24 1351 07/01/24 0135  WBC 8.6 7.4  HGB 14.1 13.0  HCT 42.8 38.8*  MCV 88.1 88.2  PLT 246 235   Basic Metabolic  Panel: Recent Labs  Lab 06/30/24 1351 07/01/24 0135  NA 129* 136  K 4.0 4.0  CL 95* 102  CO2 24 26  GLUCOSE 239* 144*  BUN 10 8  CREATININE 0.62 0.57*  CALCIUM  9.3 8.8*   Liver Function Tests: Recent Labs  Lab 06/30/24 1351  AST 17  ALT 15  ALKPHOS 70  BILITOT 0.4  PROT 6.9  ALBUMIN 4.0   CBG: Recent Labs  Lab 06/30/24 2336  GLUCAP 152*    Discharge time spent: greater than 30 minutes.  Signed: AIDA CHO, MD [facility] 07/03/2024

## 2024-07-03 NOTE — TOC Progression Note (Signed)
 Transition of Care (TOC) - Progression Note    Patient Details  Name: Greg [name]. MRN: [id number] Date of Birth: 02/23/1966  [facility]) CM/SW Contact  Corean ONEIDA Haddock, RN Phone Number: 07/03/2024, 1:56 PM  Clinical Narrative:     PASRR received [id number] E  expires 08/02/24  Expected Discharge Plan: IP Rehab Facility Barriers to Discharge: Continued Medical Work up               Expected Discharge Plan and Services       Living arrangements for the past 2 months: Single Family Home                                       Social Drivers of Health (SDOH) Interventions SDOH Screenings   Food Insecurity: No Food Insecurity (06/30/2024)  Housing: Low Risk  (06/30/2024)  Transportation Needs: No Transportation Needs (06/30/2024)  Utilities: Not At Risk (06/30/2024)  Alcohol Screen: Low Risk  (06/29/2017)  Depression (PHQ2-9): Medium Risk (04/22/2024)  Tobacco Use: High Risk (06/30/2024)    Readmission Risk Interventions     No data to display

## 2024-07-03 NOTE — Telephone Encounter (Signed)
 LMTCB

## 2024-07-03 NOTE — Telephone Encounter (Unsigned)
 Copied from CRM [id number]. Topic: General - Other >> Jul 03, 2024 11:44 AM Thersia BROCKS wrote: Reason for CRM: Patient mom called in stated son is at [facility] , stated they are trying to find him a place to transfer him to a facility - long term care facility , and would like Harlene to give her a callback

## 2024-07-03 NOTE — Plan of Care (Signed)

## 2024-07-06 NOTE — Telephone Encounter (Signed)
 FYI  Spoke with pt to let her know that the sensors were sent to the pharmacy on 06/22/2024. Pt stated that pt is currently at [facility] because he is unable to walk due to some swelling on his spine.

## 2024-07-08 ENCOUNTER — Inpatient Hospital Stay: Admission: RE | Admit: 2024-07-08 | Source: Ambulatory Visit

## 2024-07-10 ENCOUNTER — Other Ambulatory Visit (HOSPITAL_COMMUNITY): Payer: Self-pay

## 2024-07-14 ENCOUNTER — Telehealth: Payer: Self-pay | Admitting: Internal Medicine

## 2024-07-14 ENCOUNTER — Other Ambulatory Visit: Payer: Self-pay | Admitting: Internal Medicine

## 2024-07-14 ENCOUNTER — Other Ambulatory Visit (HOSPITAL_COMMUNITY): Payer: Self-pay

## 2024-07-14 MED ORDER — FREESTYLE LIBRE 3 PLUS SENSOR MISC: 3 refills | Status: AC

## 2024-07-14 NOTE — Telephone Encounter (Signed)
 Copied from CRM #[id number]. Topic: Clinical - Prescription Issue >> Jul 14, 2024  1:20 PM Deleta S wrote: Reason for CRM: angie from [employer] will not cover the patient freestyle sensors. Will like to inform medication is being ran through medicare part b

## 2024-07-21 NOTE — Addendum Note (Signed)
 Addended by: Alivia Cimino C on: 07/21/2024 02:20 PM   Modules accepted: Orders

## 2024-07-21 NOTE — Addendum Note (Signed)
 Addended by: Reegan Bouffard C on: 07/21/2024 02:19 PM   Modules accepted: Orders

## 2024-07-28 ENCOUNTER — Other Ambulatory Visit

## 2024-08-04 ENCOUNTER — Ambulatory Visit: Admitting: Internal Medicine

## 2024-08-13 ENCOUNTER — Ambulatory Visit: Admitting: Internal Medicine

## 2024-08-28 ENCOUNTER — Encounter: Admitting: Neurology

## 2024-08-28 ENCOUNTER — Encounter: Payer: Self-pay | Admitting: Neurology

## 2024-10-06 ENCOUNTER — Ambulatory Visit: Admitting: Neurology
